# Patient Record
Sex: Male | Born: 1950 | Race: Black or African American | Hispanic: No | Marital: Single | Smoking: Former smoker
Health system: Southern US, Community
[De-identification: ages and names within clinical notes are randomized; demographics above are authoritative.]

## PROBLEM LIST (undated history)

## (undated) ENCOUNTER — Emergency Department (HOSPITAL_COMMUNITY): Payer: Medicare HMO | Source: Home / Self Care

## (undated) DIAGNOSIS — M1990 Unspecified osteoarthritis, unspecified site: Secondary | ICD-10-CM

## (undated) DIAGNOSIS — E785 Hyperlipidemia, unspecified: Secondary | ICD-10-CM

## (undated) DIAGNOSIS — T7840XA Allergy, unspecified, initial encounter: Secondary | ICD-10-CM

## (undated) DIAGNOSIS — I10 Essential (primary) hypertension: Secondary | ICD-10-CM

## (undated) HISTORY — DX: Hyperlipidemia, unspecified: E78.5

## (undated) HISTORY — DX: Unspecified osteoarthritis, unspecified site: M19.90

## (undated) HISTORY — DX: Allergy, unspecified, initial encounter: T78.40XA

---

## 1998-05-10 ENCOUNTER — Emergency Department (HOSPITAL_COMMUNITY): Admission: EM | Admit: 1998-05-10 | Discharge: 1998-05-10 | Payer: Self-pay | Admitting: Emergency Medicine

## 2002-04-05 ENCOUNTER — Encounter: Payer: Self-pay | Admitting: Internal Medicine

## 2002-04-25 ENCOUNTER — Encounter: Payer: Self-pay | Admitting: Internal Medicine

## 2003-06-29 ENCOUNTER — Emergency Department (HOSPITAL_COMMUNITY): Admission: EM | Admit: 2003-06-29 | Discharge: 2003-06-29 | Payer: Self-pay | Admitting: Emergency Medicine

## 2004-08-06 ENCOUNTER — Ambulatory Visit: Payer: Self-pay | Admitting: Internal Medicine

## 2004-11-22 ENCOUNTER — Emergency Department (HOSPITAL_COMMUNITY): Admission: EM | Admit: 2004-11-22 | Discharge: 2004-11-22 | Payer: Self-pay | Admitting: Emergency Medicine

## 2004-12-14 ENCOUNTER — Ambulatory Visit: Payer: Self-pay | Admitting: Internal Medicine

## 2004-12-23 ENCOUNTER — Encounter: Payer: Self-pay | Admitting: Internal Medicine

## 2004-12-23 ENCOUNTER — Ambulatory Visit: Payer: Self-pay

## 2005-02-19 ENCOUNTER — Ambulatory Visit: Payer: Self-pay | Admitting: Internal Medicine

## 2005-03-24 ENCOUNTER — Ambulatory Visit: Payer: Self-pay | Admitting: Internal Medicine

## 2005-07-29 ENCOUNTER — Ambulatory Visit: Payer: Self-pay | Admitting: Internal Medicine

## 2006-03-30 ENCOUNTER — Ambulatory Visit: Payer: Self-pay | Admitting: Internal Medicine

## 2006-07-19 ENCOUNTER — Ambulatory Visit: Payer: Self-pay | Admitting: Internal Medicine

## 2006-12-16 ENCOUNTER — Telehealth: Payer: Self-pay | Admitting: Internal Medicine

## 2006-12-16 ENCOUNTER — Ambulatory Visit: Payer: Self-pay | Admitting: Internal Medicine

## 2006-12-16 DIAGNOSIS — D649 Anemia, unspecified: Secondary | ICD-10-CM | POA: Insufficient documentation

## 2006-12-16 DIAGNOSIS — I10 Essential (primary) hypertension: Secondary | ICD-10-CM

## 2006-12-16 DIAGNOSIS — K644 Residual hemorrhoidal skin tags: Secondary | ICD-10-CM | POA: Insufficient documentation

## 2007-05-02 ENCOUNTER — Ambulatory Visit: Payer: Self-pay | Admitting: Internal Medicine

## 2007-05-02 LAB — CONVERTED CEMR LAB
Bilirubin Urine: NEGATIVE
Blood in Urine, dipstick: NEGATIVE
Glucose, Urine, Semiquant: NEGATIVE
Ketones, urine, test strip: NEGATIVE
Nitrite: NEGATIVE
Protein, U semiquant: NEGATIVE
Specific Gravity, Urine: 1.025
Urobilinogen, UA: 0.2
WBC Urine, dipstick: NEGATIVE
pH: 5

## 2007-05-03 LAB — CONVERTED CEMR LAB
ALT: 23 units/L (ref 0–53)
AST: 21 units/L (ref 0–37)
Albumin: 3.7 g/dL (ref 3.5–5.2)
Alkaline Phosphatase: 58 units/L (ref 39–117)
BUN: 19 mg/dL (ref 6–23)
Basophils Absolute: 0 10*3/uL (ref 0.0–0.1)
Basophils Relative: 0 % (ref 0.0–1.0)
Bilirubin, Direct: 0.1 mg/dL (ref 0.0–0.3)
CO2: 24 meq/L (ref 19–32)
Calcium: 9.2 mg/dL (ref 8.4–10.5)
Chloride: 108 meq/L (ref 96–112)
Cholesterol: 193 mg/dL (ref 0–200)
Creatinine, Ser: 1.5 mg/dL (ref 0.4–1.5)
Eosinophils Absolute: 0.2 10*3/uL (ref 0.0–0.7)
Eosinophils Relative: 3.3 % (ref 0.0–5.0)
GFR calc Af Amer: 62 mL/min
GFR calc non Af Amer: 51 mL/min
Glucose, Bld: 88 mg/dL (ref 70–99)
HCT: 39.1 % (ref 39.0–52.0)
HDL: 46.2 mg/dL (ref >39.0)
Hemoglobin: 12.8 g/dL — ABNORMAL LOW (ref 13.0–17.0)
LDL Cholesterol: 124 mg/dL — ABNORMAL HIGH (ref 0–99)
Lymphocytes Relative: 49.9 % — ABNORMAL HIGH (ref 12.0–46.0)
MCHC: 32.8 g/dL (ref 30.0–36.0)
MCV: 94.5 fL (ref 78.0–100.0)
Monocytes Absolute: 0.6 10*3/uL (ref 0.1–1.0)
Monocytes Relative: 11.9 % (ref 3.0–12.0)
Neutro Abs: 1.7 10*3/uL (ref 1.4–7.7)
Neutrophils Relative %: 34.9 % — ABNORMAL LOW (ref 43.0–77.0)
PSA: 0.57 ng/mL (ref 0.10–4.00)
Platelets: 229 10*3/uL (ref 150–400)
Potassium: 4.1 meq/L (ref 3.5–5.1)
RBC: 4.14 M/uL — ABNORMAL LOW (ref 4.22–5.81)
RDW: 12.3 % (ref 11.5–14.6)
Sodium: 138 meq/L (ref 135–145)
TSH: 1.59 microintl units/mL (ref 0.35–5.50)
Total Bilirubin: 0.7 mg/dL (ref 0.3–1.2)
Total CHOL/HDL Ratio: 4.2
Total Protein: 7.2 g/dL (ref 6.0–8.3)
Triglycerides: 113 mg/dL (ref 0–149)
VLDL: 23 mg/dL (ref 0–40)
WBC: 5 10*3/uL (ref 4.5–10.5)

## 2007-06-06 ENCOUNTER — Ambulatory Visit: Payer: Self-pay | Admitting: Internal Medicine

## 2007-06-06 DIAGNOSIS — F528 Other sexual dysfunction not due to a substance or known physiological condition: Secondary | ICD-10-CM

## 2007-07-12 IMAGING — CR DG CHEST 2V
2 series · 2 of 2 positions shown · non-contrast
Comparison: None.

CLINICAL DATA: Fall.  Pain.  
 2-VIEW CHEST:

[view not recorded (1 of 2)]
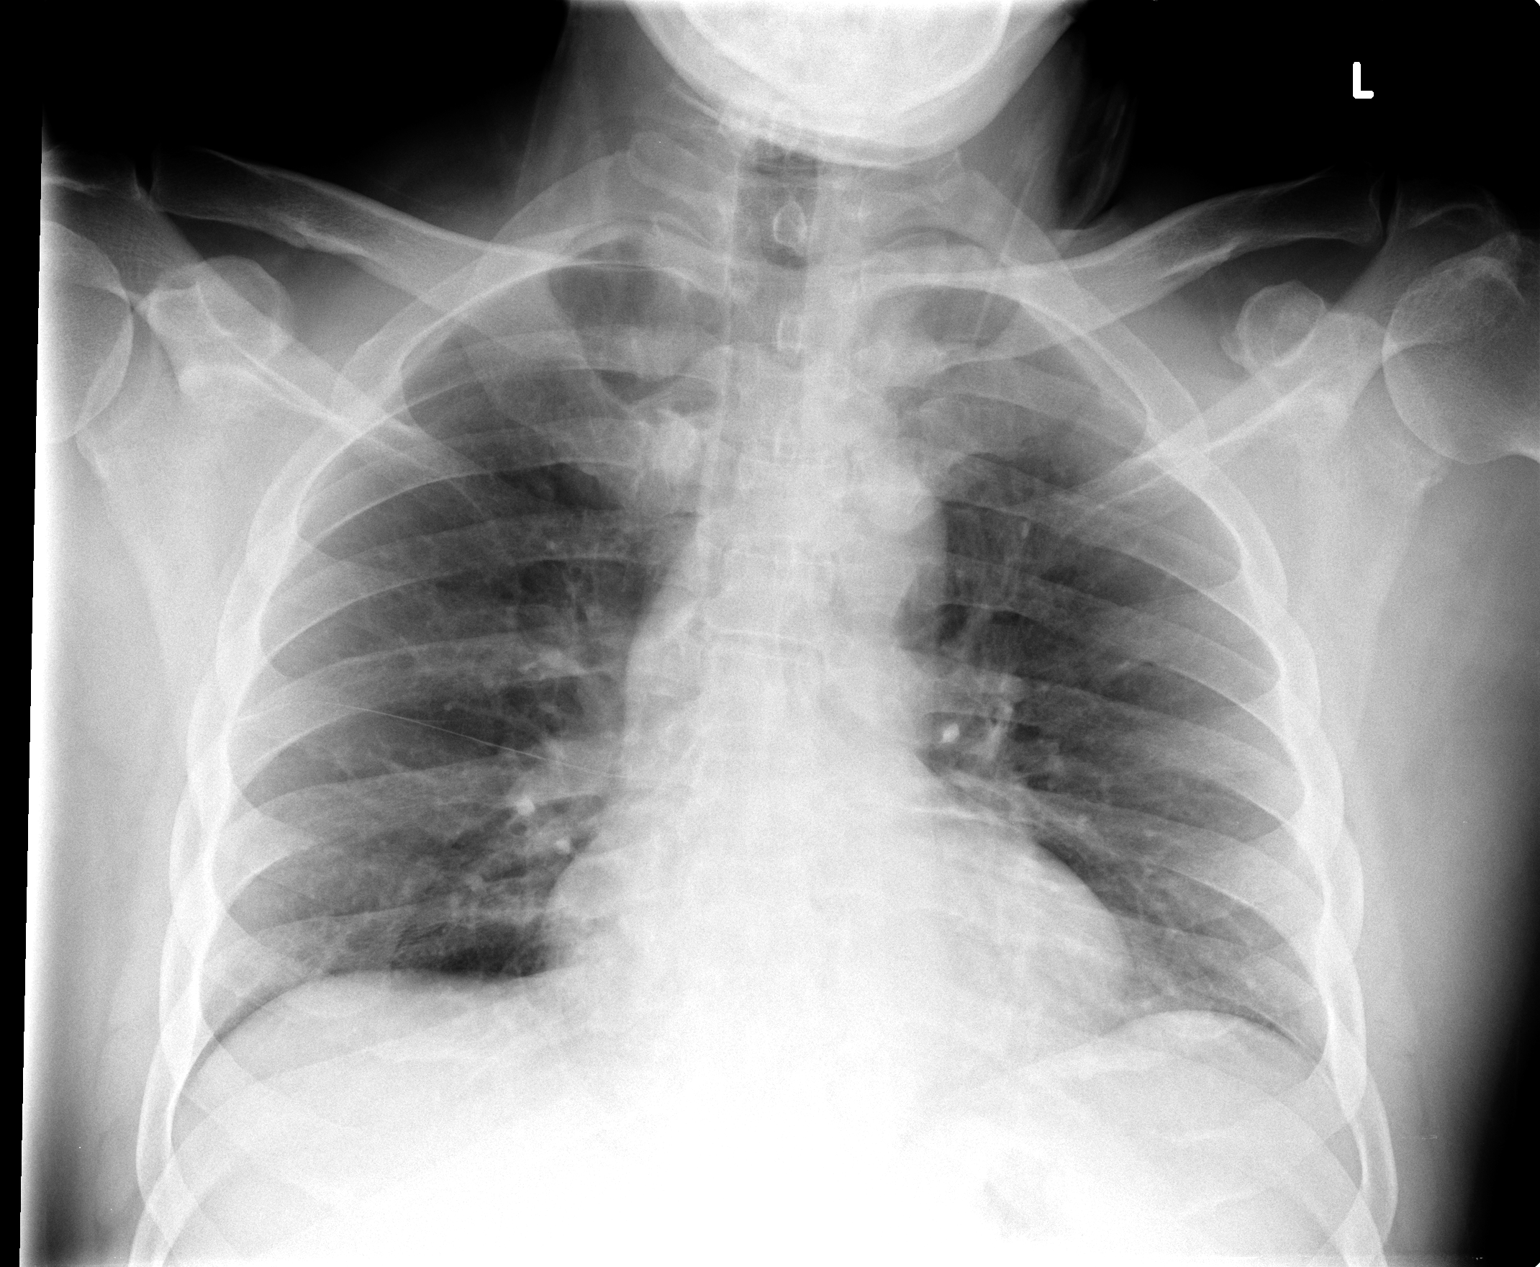

[view not recorded (2 of 2)]
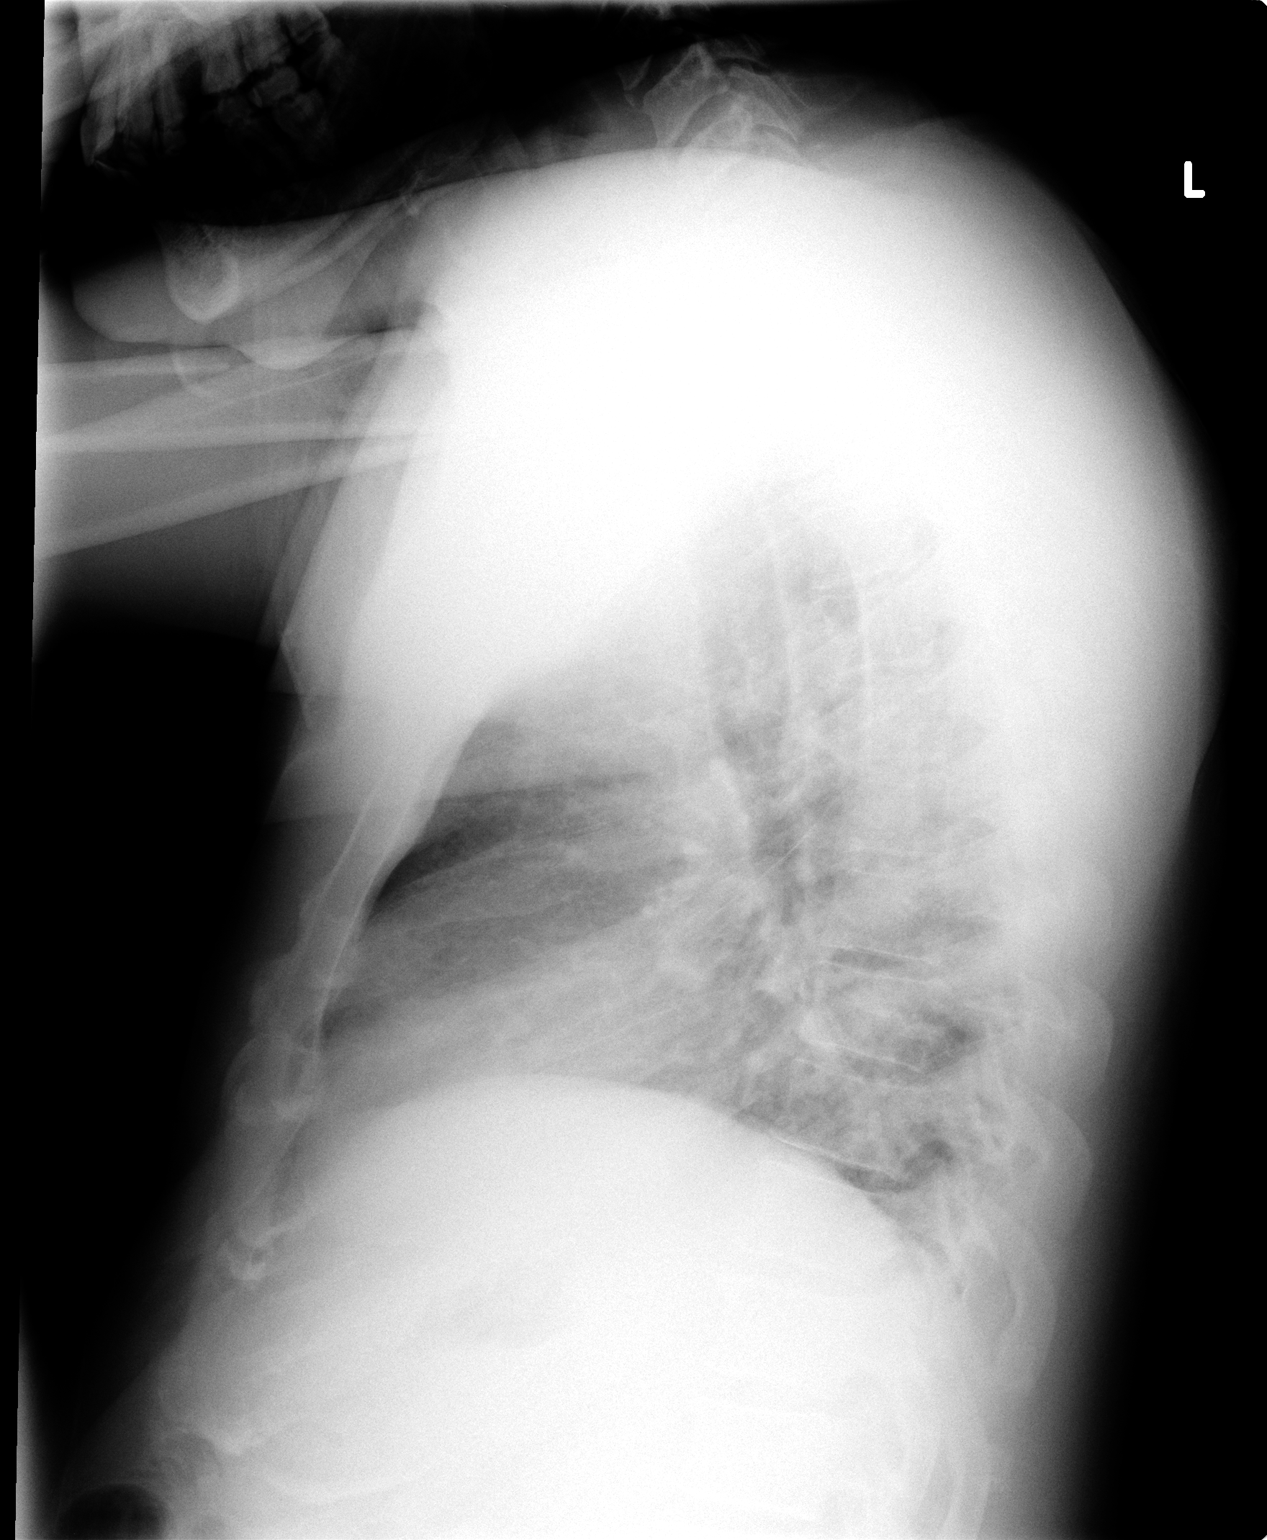

[2 of 2 positions shown; findings below may reference images not displayed]

FINDINGS: Heart size is upper limits of normal.  
 There are no effusions. 
 No pulmonary edema.  No focal lung opacities are identified.
IMPRESSION: Mild cardiomegaly.
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
## 2010-04-10 ENCOUNTER — Emergency Department (HOSPITAL_COMMUNITY)
Admission: EM | Admit: 2010-04-10 | Discharge: 2010-04-10 | Disposition: A | Discharge status: Home / Self Care | Payer: Self-pay | Attending: Emergency Medicine | Admitting: Emergency Medicine

## 2010-04-10 DIAGNOSIS — R109 Unspecified abdominal pain: Secondary | ICD-10-CM | POA: Insufficient documentation

## 2010-04-10 DIAGNOSIS — I10 Essential (primary) hypertension: Secondary | ICD-10-CM | POA: Insufficient documentation

## 2010-04-10 LAB — URINALYSIS, ROUTINE W REFLEX MICROSCOPIC
Bilirubin Urine: NEGATIVE
Glucose, UA: NEGATIVE mg/dL
Hgb urine dipstick: NEGATIVE
Ketones, ur: NEGATIVE mg/dL
Nitrite: NEGATIVE
Protein, ur: NEGATIVE mg/dL
Specific Gravity, Urine: 1.027 (ref 1.005–1.030)
Urobilinogen, UA: 0.2 mg/dL (ref 0.0–1.0)
pH: 5.5 (ref 5.0–8.0)

## 2010-04-10 LAB — CBC
HCT: 38.2 % — ABNORMAL LOW (ref 39.0–52.0)
Hemoglobin: 12.9 g/dL — ABNORMAL LOW (ref 13.0–17.0)
MCH: 30.8 pg (ref 26.0–34.0)
MCHC: 33.8 g/dL (ref 30.0–36.0)
MCV: 91.2 fL (ref 78.0–100.0)
Platelets: 235 10*3/uL (ref 150–400)
RBC: 4.19 MIL/uL — ABNORMAL LOW (ref 4.22–5.81)
RDW: 12.7 % (ref 11.5–15.5)
WBC: 4.8 10*3/uL (ref 4.0–10.5)

## 2010-04-10 LAB — URINE MICROSCOPIC-ADD ON

## 2010-04-10 LAB — DIFFERENTIAL
Basophils Absolute: 0 10*3/uL (ref 0.0–0.1)
Basophils Relative: 0 % (ref 0–1)
Eosinophils Absolute: 0.1 K/uL (ref 0.0–0.7)
Eosinophils Relative: 3 % (ref 0–5)
Lymphocytes Relative: 42 % (ref 12–46)
Lymphs Abs: 2 10*3/uL (ref 0.7–4.0)
Monocytes Absolute: 0.5 10*3/uL (ref 0.1–1.0)
Monocytes Relative: 10 % (ref 3–12)
Neutro Abs: 2.2 10*3/uL (ref 1.7–7.7)
Neutrophils Relative %: 45 % (ref 43–77)

## 2010-04-10 LAB — COMPREHENSIVE METABOLIC PANEL
ALT: 20 U/L (ref 0–53)
AST: 19 U/L (ref 0–37)
Albumin: 3.9 g/dL (ref 3.5–5.2)
Alkaline Phosphatase: 57 U/L (ref 39–117)
BUN: 15 mg/dL (ref 6–23)
CO2: 24 mEq/L (ref 19–32)
Calcium: 9.4 mg/dL (ref 8.4–10.5)
Chloride: 107 mEq/L (ref 96–112)
Creatinine, Ser: 1.23 mg/dL (ref 0.4–1.5)
GFR calc Af Amer: >60 mL/min (ref >60)
GFR calc non Af Amer: >60 mL/min (ref >60)
Glucose, Bld: 100 mg/dL — ABNORMAL HIGH (ref 70–99)
Potassium: 4.1 mEq/L (ref 3.5–5.1)
Sodium: 136 mEq/L (ref 135–145)
Total Bilirubin: 0.4 mg/dL (ref 0.3–1.2)
Total Protein: 7.8 g/dL (ref 6.0–8.3)

## 2010-04-12 LAB — URINE CULTURE
Colony Count: NO GROWTH
Culture  Setup Time: 201203171312
Culture: NO GROWTH

## 2010-04-16 ENCOUNTER — Emergency Department (HOSPITAL_COMMUNITY)
Admission: EM | Admit: 2010-04-16 | Discharge: 2010-04-16 | Disposition: A | Discharge status: Home / Self Care | Payer: Self-pay | Attending: Emergency Medicine | Admitting: Emergency Medicine

## 2010-04-16 DIAGNOSIS — R12 Heartburn: Secondary | ICD-10-CM | POA: Insufficient documentation

## 2010-04-16 DIAGNOSIS — R1013 Epigastric pain: Secondary | ICD-10-CM | POA: Insufficient documentation

## 2010-04-16 DIAGNOSIS — I10 Essential (primary) hypertension: Secondary | ICD-10-CM | POA: Insufficient documentation

## 2010-04-16 LAB — COMPREHENSIVE METABOLIC PANEL
ALT: 17 U/L (ref 0–53)
AST: 22 U/L (ref 0–37)
Albumin: 3.9 g/dL (ref 3.5–5.2)
Alkaline Phosphatase: 66 U/L (ref 39–117)
BUN: 11 mg/dL (ref 6–23)
CO2: 27 mEq/L (ref 19–32)
Calcium: 9.3 mg/dL (ref 8.4–10.5)
Chloride: 107 mEq/L (ref 96–112)
Creatinine, Ser: 1.34 mg/dL (ref 0.4–1.5)
GFR calc Af Amer: >60 mL/min (ref >60)
GFR calc non Af Amer: 54 mL/min — ABNORMAL LOW (ref >60)
Glucose, Bld: 99 mg/dL (ref 70–99)
Potassium: 4.6 mEq/L (ref 3.5–5.1)
Sodium: 139 mEq/L (ref 135–145)
Total Bilirubin: 0.8 mg/dL (ref 0.3–1.2)
Total Protein: 7.4 g/dL (ref 6.0–8.3)

## 2010-04-16 LAB — CBC
HCT: 37.5 % — ABNORMAL LOW (ref 39.0–52.0)
Hemoglobin: 12.9 g/dL — ABNORMAL LOW (ref 13.0–17.0)
MCH: 31.9 pg (ref 26.0–34.0)
MCHC: 34.4 g/dL (ref 30.0–36.0)
MCV: 92.6 fL (ref 78.0–100.0)
Platelets: 261 10*3/uL (ref 150–400)
RBC: 4.05 MIL/uL — ABNORMAL LOW (ref 4.22–5.81)
RDW: 12.6 % (ref 11.5–15.5)
WBC: 5 10*3/uL (ref 4.0–10.5)

## 2010-04-16 LAB — DIFFERENTIAL
Basophils Absolute: 0 10*3/uL (ref 0.0–0.1)
Basophils Relative: 1 % (ref 0–1)
Eosinophils Absolute: 0.4 10*3/uL (ref 0.0–0.7)
Eosinophils Relative: 7 % — ABNORMAL HIGH (ref 0–5)
Lymphocytes Relative: 34 % (ref 12–46)
Lymphs Abs: 1.7 10*3/uL (ref 0.7–4.0)
Monocytes Absolute: 0.5 10*3/uL (ref 0.1–1.0)
Monocytes Relative: 11 % (ref 3–12)
Neutro Abs: 2.4 10*3/uL (ref 1.7–7.7)
Neutrophils Relative %: 47 % (ref 43–77)

## 2010-04-16 LAB — LIPASE, BLOOD: Lipase: 28 U/L (ref 11–59)

## 2010-04-16 LAB — POCT CARDIAC MARKERS
CKMB, poc: <1 ng/mL — ABNORMAL LOW (ref 1.0–8.0)
Myoglobin, poc: 75.8 ng/mL (ref 12–200)
Troponin i, poc: <0.05 ng/mL (ref 0.00–0.09)

## 2010-11-28 ENCOUNTER — Emergency Department (HOSPITAL_COMMUNITY): Payer: Self-pay

## 2010-11-28 ENCOUNTER — Emergency Department (HOSPITAL_COMMUNITY)
Admission: EM | Admit: 2010-11-28 | Discharge: 2010-11-28 | Disposition: A | Discharge status: Home / Self Care | Payer: Self-pay | Attending: Emergency Medicine | Admitting: Emergency Medicine

## 2010-11-28 DIAGNOSIS — D649 Anemia, unspecified: Secondary | ICD-10-CM | POA: Insufficient documentation

## 2010-11-28 DIAGNOSIS — I10 Essential (primary) hypertension: Secondary | ICD-10-CM | POA: Insufficient documentation

## 2010-11-28 DIAGNOSIS — K922 Gastrointestinal hemorrhage, unspecified: Secondary | ICD-10-CM | POA: Insufficient documentation

## 2010-11-28 DIAGNOSIS — R110 Nausea: Secondary | ICD-10-CM | POA: Insufficient documentation

## 2010-11-28 DIAGNOSIS — K5733 Diverticulitis of large intestine without perforation or abscess with bleeding: Secondary | ICD-10-CM | POA: Insufficient documentation

## 2010-11-28 DIAGNOSIS — R1013 Epigastric pain: Secondary | ICD-10-CM | POA: Insufficient documentation

## 2010-11-28 LAB — CBC
HCT: 29.7 % — ABNORMAL LOW (ref 39.0–52.0)
Hemoglobin: 10.2 g/dL — ABNORMAL LOW (ref 13.0–17.0)
MCH: 31.4 pg (ref 26.0–34.0)
MCHC: 34.3 g/dL (ref 30.0–36.0)
MCV: 91.4 fL (ref 78.0–100.0)
Platelets: 207 10*3/uL (ref 150–400)
RBC: 3.25 MIL/uL — ABNORMAL LOW (ref 4.22–5.81)
RDW: 12.1 % (ref 11.5–15.5)
WBC: 5.5 10*3/uL (ref 4.0–10.5)

## 2010-11-28 LAB — DIFFERENTIAL
Basophils Absolute: 0 10*3/uL (ref 0.0–0.1)
Basophils Relative: 0 % (ref 0–1)
Eosinophils Absolute: 0.2 10*3/uL (ref 0.0–0.7)
Eosinophils Relative: 3 % (ref 0–5)
Lymphocytes Relative: 43 % (ref 12–46)
Lymphs Abs: 2.3 10*3/uL (ref 0.7–4.0)
Monocytes Absolute: 0.5 10*3/uL (ref 0.1–1.0)
Monocytes Relative: 9 % (ref 3–12)
Neutro Abs: 2.5 10*3/uL (ref 1.7–7.7)
Neutrophils Relative %: 45 % (ref 43–77)

## 2010-11-28 LAB — URINALYSIS, ROUTINE W REFLEX MICROSCOPIC
Bilirubin Urine: NEGATIVE
Ketones, ur: NEGATIVE mg/dL
Leukocytes, UA: NEGATIVE
Nitrite: NEGATIVE
Urobilinogen, UA: 0.2 mg/dL (ref 0.0–1.0)

## 2010-11-28 LAB — OCCULT BLOOD, POC DEVICE: Fecal Occult Bld: POSITIVE

## 2010-11-28 LAB — COMPREHENSIVE METABOLIC PANEL
ALT: 17 U/L (ref 0–53)
AST: 16 U/L (ref 0–37)
Albumin: 3.3 g/dL — ABNORMAL LOW (ref 3.5–5.2)
Alkaline Phosphatase: 59 U/L (ref 39–117)
BUN: 17 mg/dL (ref 6–23)
CO2: 21 mEq/L (ref 19–32)
Calcium: 9.1 mg/dL (ref 8.4–10.5)
Chloride: 106 mEq/L (ref 96–112)
Creatinine, Ser: 1.24 mg/dL (ref 0.50–1.35)
GFR calc Af Amer: 71 mL/min — ABNORMAL LOW (ref >90)
GFR calc non Af Amer: 61 mL/min — ABNORMAL LOW (ref >90)
Glucose, Bld: 108 mg/dL — ABNORMAL HIGH (ref 70–99)
Potassium: 4 mEq/L (ref 3.5–5.1)
Sodium: 138 mEq/L (ref 135–145)
Total Bilirubin: 0.3 mg/dL (ref 0.3–1.2)
Total Protein: 6.5 g/dL (ref 6.0–8.3)

## 2010-11-28 LAB — LIPASE, BLOOD: Lipase: 27 U/L (ref 11–59)

## 2010-11-28 MED ORDER — IOHEXOL 300 MG/ML  SOLN
100.0000 mL | Freq: Once | INTRAMUSCULAR | Status: AC | PRN
  Administered 2010-11-28: 100 mL via INTRAVENOUS

## 2012-05-03 ENCOUNTER — Ambulatory Visit: Payer: Self-pay | Admitting: Family Medicine

## 2012-06-02 ENCOUNTER — Encounter (INDEPENDENT_AMBULATORY_CARE_PROVIDER_SITE_OTHER): Payer: BC Managed Care – PPO | Admitting: Ophthalmology

## 2012-06-02 DIAGNOSIS — I10 Essential (primary) hypertension: Secondary | ICD-10-CM

## 2012-06-02 DIAGNOSIS — H35039 Hypertensive retinopathy, unspecified eye: Secondary | ICD-10-CM

## 2012-06-02 DIAGNOSIS — H348392 Tributary (branch) retinal vein occlusion, unspecified eye, stable: Secondary | ICD-10-CM

## 2012-06-02 DIAGNOSIS — H43819 Vitreous degeneration, unspecified eye: Secondary | ICD-10-CM

## 2012-06-02 DIAGNOSIS — H2510 Age-related nuclear cataract, unspecified eye: Secondary | ICD-10-CM

## 2012-06-15 ENCOUNTER — Emergency Department (HOSPITAL_COMMUNITY)
Admission: EM | Admit: 2012-06-15 | Discharge: 2012-06-15 | Disposition: A | Discharge status: Home / Self Care | Payer: BC Managed Care – PPO | Attending: Emergency Medicine | Admitting: Emergency Medicine

## 2012-06-15 ENCOUNTER — Encounter (HOSPITAL_COMMUNITY): Payer: Self-pay | Admitting: Emergency Medicine

## 2012-06-15 ENCOUNTER — Emergency Department (HOSPITAL_COMMUNITY): Payer: BC Managed Care – PPO

## 2012-06-15 DIAGNOSIS — I10 Essential (primary) hypertension: Secondary | ICD-10-CM | POA: Insufficient documentation

## 2012-06-15 DIAGNOSIS — Z79899 Other long term (current) drug therapy: Secondary | ICD-10-CM | POA: Insufficient documentation

## 2012-06-15 DIAGNOSIS — K859 Acute pancreatitis without necrosis or infection, unspecified: Secondary | ICD-10-CM | POA: Insufficient documentation

## 2012-06-15 DIAGNOSIS — R630 Anorexia: Secondary | ICD-10-CM | POA: Insufficient documentation

## 2012-06-15 HISTORY — DX: Essential (primary) hypertension: I10

## 2012-06-15 LAB — COMPREHENSIVE METABOLIC PANEL
ALT: 39 U/L (ref 0–53)
AST: 30 U/L (ref 0–37)
Albumin: 3.5 g/dL (ref 3.5–5.2)
Alkaline Phosphatase: 85 U/L (ref 39–117)
BUN: 14 mg/dL (ref 6–23)
Potassium: 3.7 mEq/L (ref 3.5–5.1)
Sodium: 137 mEq/L (ref 135–145)
Total Protein: 8.2 g/dL (ref 6.0–8.3)

## 2012-06-15 LAB — CBC WITH DIFFERENTIAL/PLATELET
Basophils Absolute: 0 10*3/uL (ref 0.0–0.1)
Basophils Relative: 0 % (ref 0–1)
Eosinophils Absolute: 0.1 10*3/uL (ref 0.0–0.7)
MCH: 31 pg (ref 26.0–34.0)
MCHC: 34.3 g/dL (ref 30.0–36.0)
Neutro Abs: 6.6 10*3/uL (ref 1.7–7.7)
Neutrophils Relative %: 72 % (ref 43–77)
Platelets: 491 10*3/uL — ABNORMAL HIGH (ref 150–400)
RDW: 12.5 % (ref 11.5–15.5)

## 2012-06-15 LAB — LIPASE, BLOOD: Lipase: 290 U/L — ABNORMAL HIGH (ref 11–59)

## 2012-06-15 MED ORDER — HYDROMORPHONE HCL PF 1 MG/ML IJ SOLN
1.0000 mg | Freq: Once | INTRAMUSCULAR | Status: AC
  Administered 2012-06-15: 1 mg via INTRAVENOUS
  Filled 2012-06-15: qty 1

## 2012-06-15 MED ORDER — IOHEXOL 300 MG/ML  SOLN
25.0000 mL | INTRAMUSCULAR | Status: AC
  Administered 2012-06-15: 25 mL via ORAL

## 2012-06-15 MED ORDER — SODIUM CHLORIDE 0.9 % IV BOLUS (SEPSIS)
1000.0000 mL | Freq: Once | INTRAVENOUS | Status: AC
  Administered 2012-06-15: 1000 mL via INTRAVENOUS

## 2012-06-15 MED ORDER — OXYCODONE-ACETAMINOPHEN 5-325 MG PO TABS: 1.0000 | ORAL_TABLET | Freq: Four times a day (QID) | ORAL | Status: DC | PRN

## 2012-06-15 MED ORDER — ONDANSETRON HCL 4 MG/2ML IJ SOLN
4.0000 mg | Freq: Once | INTRAMUSCULAR | Status: AC
  Administered 2012-06-15: 4 mg via INTRAVENOUS
  Filled 2012-06-15: qty 2

## 2012-06-15 MED ORDER — IOHEXOL 300 MG/ML  SOLN
100.0000 mL | Freq: Once | INTRAMUSCULAR | Status: AC | PRN
  Administered 2012-06-15: 100 mL via INTRAVENOUS

## 2012-06-15 NOTE — ED Notes (Signed)
Pt d/c in NAD, pt voiced understanding of d/c instructions and follow up care. Pt instructed not to drive until 1610. Pt voiced understanding. Waiting in waiting room under eye of Nurse First until pt allowed to drive.

## 2012-06-15 NOTE — ED Notes (Signed)
Results of lactic acid shown to nurse on pod B

## 2012-06-15 NOTE — ED Notes (Signed)
Pt transported to CT ?

## 2012-06-15 NOTE — ED Provider Notes (Signed)
History     CSN: [id number]  Arrival date & time 06/15/12  1478   First MD Initiated Contact with Patient 06/15/12 [phone]      No chief complaint on file.   (Consider location/radiation/quality/duration/timing/severity/associated sxs/prior treatment) HPI Comments: Patient states around 9 PM last night he developed severe abdominal pain not sharp in nature without radiation. He describes the pain in the middle of his abdomen. No nausea, vomiting, diarrhea, constipation, dysuria, chest pain or shortness of breath.  No prior surgical history on the abdomen and denies any change in color of his bowel movements. Last normal bowel movement was yesterday.  Patient is a 62 y.o. male presenting with abdominal pain. The history is provided by the patient.  Abdominal Pain This is a recurrent (Patient had a similar episode 3 weeks ago that lasted for a few hours and resolved. Pain returned last night and has been persistent) problem. The current episode started 6 to 12 hours ago. The problem occurs constantly. The problem has not changed since onset.Associated symptoms include abdominal pain. Pertinent negatives include no chest pain, no headaches and no shortness of breath. The symptoms are aggravated by walking. Nothing relieves the symptoms. He has tried acetaminophen for the symptoms. The treatment provided no relief.    No past medical history on file.  No past surgical history on file.  No family history on file.  History  Substance Use Topics  . Smoking status: Not on file  . Smokeless tobacco: Not on file  . Alcohol Use: Not on file      Review of Systems  Constitutional: Positive for appetite change. Negative for fever.       States since the chest pain started he has not been hungry  Respiratory: Negative for cough and shortness of breath.   Cardiovascular: Negative for chest pain.  Gastrointestinal: Positive for abdominal pain. Negative for nausea, vomiting, diarrhea,  constipation and blood in stool.  Genitourinary: Negative for dysuria, frequency and flank pain.  Musculoskeletal: Negative for back pain.  Neurological: Negative for headaches.  All other systems reviewed and are negative.    Allergies  Review of patient's allergies indicates not on file.  Home Medications  No current outpatient prescriptions on file.  There were no vitals taken for this visit.  Physical Exam  Nursing note and vitals reviewed. Constitutional: He is oriented to person, place, and time. He appears well-developed and well-nourished. He appears distressed.  Appears uncomfortable  HENT:  Head: Normocephalic and atraumatic.  Mouth/Throat: Oropharynx is clear and moist.  Eyes: Conjunctivae and EOM are normal. Pupils are equal, round, and reactive to light.  Neck: Normal range of motion. Neck supple.  Cardiovascular: Normal rate, regular rhythm and intact distal pulses.   No murmur heard. Pulmonary/Chest: Effort normal and breath sounds normal. No respiratory distress. He has no wheezes. He has no rales.  Abdominal: Soft. He exhibits distension. There is no tenderness. There is no rebound and no guarding.  Unable to reproduce any significant abdominal tenderness  Musculoskeletal: Normal range of motion. He exhibits no edema and no tenderness.  Neurological: He is alert and oriented to person, place, and time.  Skin: Skin is warm and dry. No rash noted. No erythema.  Psychiatric: He has a normal mood and affect. His behavior is normal.    ED Course  Procedures (including critical care time)  Labs Reviewed  CBC WITH DIFFERENTIAL - Abnormal; Notable for the following:    RBC 3.94 (*)    Hemoglobin  12.2 (*)    HCT 35.6 (*)    Platelets 491 (*)    All other components within normal limits  COMPREHENSIVE METABOLIC PANEL - Abnormal; Notable for the following:    GFR calc non Af Amer 71 (*)    GFR calc Af Amer 82 (*)    All other components within normal limits   LIPASE, BLOOD - Abnormal; Notable for the following:    Lipase 290 (*)    All other components within normal limits  URINALYSIS, ROUTINE W REFLEX MICROSCOPIC  CG4 I-STAT (LACTIC ACID)   Ct Abdomen Pelvis W Contrast  06/15/2012   *RADIOLOGY REPORT*  Clinical Data: Abdominal pain for 2-3 weeks, resolved than returned 1 day ago, history hypertension  CT ABDOMEN AND PELVIS WITH CONTRAST  Technique:  Multidetector CT imaging of the abdomen and pelvis was performed following the standard protocol during bolus administration of intravenous contrast. Sagittal and coronal MPR images reconstructed from axial data set.  Contrast: OMNIPAQUE IOHEXOL 300 MG/ML  SOLN Dilute oral contrast.  Comparison: 11/28/2010  Findings: Minimal dependent atelectasis at lung bases. Tail of pancreas appears mildly enlarged with mild stranding of peripancreatic fat, cannot exclude distal pancreatitis. Tiny left renal cyst. Mild perinephric stranding without hydronephrosis or ureteral dilatation. Tiny umbilical hernia containing fat. Liver, spleen, pancreas, kidneys, and adrenal glands otherwise normal appearance.  Upper normal to minimally enlarged peripancreatic and celiac axis lymph nodes largest 19 x 13 mm image 29. Normal appendix. Low attenuation dependent free pelvic fluid. Scattered colonic diverticulosis without evidence of diverticulitis. Stomach and bowel loops otherwise normal appearance. No mass, additional hernia, or free intraperitoneal air. No acute osseous findings.  IMPRESSION: Question subtle infiltrative changes adjacent to the pancreatic tail, cannot exclude distal pancreatitis; recommend correlation with serum amylase. Small amount of nonspecific dependent low attenuation free pelvic fluid. Nonspecific mild peripancreatic/celiac adenopathy. Diffuse colonic diverticulosis without evidence of diverticulitis. Small umbilical hernia containing fat.   Original Report Authenticated By: Ulyses Southward, M.D.     No  diagnosis found.    MDM   Patient presenting here complaining of abdominal pain that started at 9 PM last night and has persisted. Similar episode 3 weeks ago that resolved spontaneously. On exam patient appears uncomfortable however abdominal pain out of proportion to exam findings. No palpable tenderness on exam abdomen is mildly distended. He denies change in bowel movements or urinary symptoms. No chest pain, shortness of breath or radiation of pain into the back.  Concern for possible mesenteric ischemia versus bowel perforation versus peptic ulcer disease versus other abdominal pathology.  CBC, CMP, lipase, UA, lactate, CT abdomen and pelvis pending. Patient given IV fluids, pain and nausea control  11:06 AM Labs are within normal limits except for an elevated lipase of 290. This would definitely explain his pain. However will do a CT to rule out underlying causes for patient's new pancreatitis. He denies heavy alcohol use and does not drink daily. He has no right upper quadrant tenderness to make me concerned for his gallbladder  1:19 PM CT showing uncomplicated pancreatitis without masses, gallbladder pathology or other associated etiologies. Patient's pain control and tolerating by mouth so we'll discharge home.    Gwyneth Sprout, MD 06/15/12 1319

## 2012-06-15 NOTE — ED Notes (Signed)
Onset abdominal pain 2-3 weeks ago resolved and returned one day ago denies nausea emesis or diarrhea.

## 2012-07-05 ENCOUNTER — Encounter (INDEPENDENT_AMBULATORY_CARE_PROVIDER_SITE_OTHER): Payer: BC Managed Care – PPO | Admitting: Ophthalmology

## 2012-07-19 ENCOUNTER — Encounter: Payer: Self-pay | Admitting: Gastroenterology

## 2012-09-06 ENCOUNTER — Ambulatory Visit (AMBULATORY_SURGERY_CENTER): Payer: BC Managed Care – PPO | Admitting: *Deleted

## 2012-09-06 ENCOUNTER — Encounter: Payer: Self-pay | Admitting: Gastroenterology

## 2012-09-06 VITALS — Ht 73.0 in | Wt 228.0 lb

## 2012-09-06 DIAGNOSIS — Z1211 Encounter for screening for malignant neoplasm of colon: Secondary | ICD-10-CM

## 2012-09-06 MED ORDER — MOVIPREP 100 G PO SOLR: ORAL | Status: DC

## 2012-09-06 NOTE — Progress Notes (Signed)
Patient denies any allergies to eggs or soy. 

## 2012-09-20 ENCOUNTER — Encounter: Payer: Self-pay | Admitting: Gastroenterology

## 2012-09-20 ENCOUNTER — Ambulatory Visit (AMBULATORY_SURGERY_CENTER): Payer: BC Managed Care – PPO | Admitting: Gastroenterology

## 2012-09-20 VITALS — BP 150/88 | HR 47 | Temp 97.2°F | Resp 16 | Ht 73.0 in | Wt 228.0 lb

## 2012-09-20 DIAGNOSIS — D126 Benign neoplasm of colon, unspecified: Secondary | ICD-10-CM

## 2012-09-20 DIAGNOSIS — Z1211 Encounter for screening for malignant neoplasm of colon: Secondary | ICD-10-CM

## 2012-09-20 MED ORDER — SODIUM CHLORIDE 0.9 % IV SOLN: 500.0000 mL | INTRAVENOUS | Status: DC
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
## 2012-09-20 NOTE — Progress Notes (Signed)
Lidocaine-40mg IV prior to Propofol InductionPropofol given over incremental dosages 

## 2012-09-20 NOTE — Progress Notes (Signed)
Called to room to assist during endoscopic procedure.  Patient ID and intended procedure confirmed with present staff. Received instructions for my participation in the procedure from the performing physician.  

## 2012-09-20 NOTE — Op Note (Signed)
[facility] [address] [place], [address]   COLONOSCOPY PROCEDURE REPORT  PATIENT: Leslie Livingston, Leslie Livingston  MR#: [id number] BIRTHDATE: December 04, 1950 , 61  yrs. old GENDER: Male ENDOSCOPIST: Meryl Dare, MD, [facility] PROCEDURE DATE:  09/20/2012 PROCEDURE:   Colonoscopy with biopsy and snare polypectomy First Screening Colonoscopy - Avg.  risk and is 50 yrs.  old or older - No.  Prior Negative Screening - Now for repeat screening. 10 or more years since last screening  History of Adenoma - Now for follow-up colonoscopy & has been > or = to 3 yrs.  N/A  Polyps Removed Today? Yes. ASA CLASS:   Class II INDICATIONS:average risk screening. MEDICATIONS: MAC sedation, administered by CRNA and propofol (Diprivan) 350mg  IV DESCRIPTION OF PROCEDURE:   After the risks benefits and alternatives of the procedure were thoroughly explained, informed consent was obtained.  A digital rectal exam revealed no abnormalities of the rectum.   The LB [id number]  endoscope was introduced through the anus and advanced to the cecum, which was identified by both the appendix and ileocecal valve. No adverse events experienced.   The quality of the prep was good, using MoviPrep  The instrument was then slowly withdrawn as the colon was fully examined.  COLON FINDINGS: A sessile polyp measuring 5 mm in size was found in the ascending colon.  A polypectomy was performed with a cold snare.  The resection was complete and the polyp tissue was completely retrieved.   A sessile polyp measuring 3 mm in size was found at the hepatic flexure.  A polypectomy was performed with cold forceps.  The resection was complete and the polyp tissue was completely retrieved.   A sessile polyp measuring 6 mm in size was found in the sigmoid colon.  A polypectomy was performed with a cold snare.  The resection was complete and the polyp tissue was completely retrieved.   Two sessile polyps measuring 3 mm in  size were found in rectum seen upon the retroflexed view.  A polypectomy was performed with cold forceps.  The resection was complete and the polyp tissue was completely retrieved.   Mild diverticulosis was noted in the ascending colon, at the hepatic flexure, and in the transverse colon.  Moderate diverticulosis was noted in the descending colon and sigmoid colon.  The colon was otherwise normal.  There was no diverticulosis, inflammation, polyps or cancers unless previously stated.  Retroflexed views revealed small internal hemorrhoids. The time to cecum=1 minutes 58 seconds. Withdrawal time=14 minutes 48 seconds.  The scope was withdrawn and the procedure completed.  COMPLICATIONS: There were no complications.  ENDOSCOPIC IMPRESSION: 1.   Sessile polyp measuring 5 mm in the ascending colon; polypectomy performed with a cold snare 2.   Sessile polyp measuring 3 mm at the hepatic flexure; polypectomy performed with cold forceps 3.   Sessile polyp measuring 6 mm in the sigmoid colon; polypectomy performed with a cold snare 4.  Two sessile polyps measuring 3 mm in rectum; polypectomy performed with cold forceps 5.   Mild diverticulosis was noted in the ascending colon, at the hepatic flexure, and in the transverse colon 6.   Moderate diverticulosis was noted in the descending colon and sigmoid colon 7.   Small internal hemorrhoids   RECOMMENDATIONS: 1.  Await pathology results 2.  Repeat colonoscopy in 5 years if polyp(s) adenomatous; otherwise 10 years 3.  High fiber diet with liberal fluid intake.   eSigned:  Meryl Dare,  MD, [facility] 09/20/2012 11:58 AM   cc: Fleet Contras, MD   PATIENT NAME:  Leslie Livingston, Leslie Livingston MR#: [id number]

## 2012-09-20 NOTE — Patient Instructions (Addendum)
Discharge instructions given with verbal understanding. Handouts on polyps,diverticulosis and hemorrhoids. Resume previous medications. YOU HAD AN ENDOSCOPIC PROCEDURE TODAY AT THE [facility]: Refer to the procedure report that was given to you for any specific questions about what was found during the examination.  If the procedure report does not answer your questions, please call your gastroenterologist to clarify.  If you requested that your care partner not be given the details of your procedure findings, then the procedure report has been included in a sealed envelope for you to review at your convenience later.  YOU SHOULD EXPECT: Some feelings of bloating in the abdomen. Passage of more gas than usual.  Walking can help get rid of the air that was put into your GI tract during the procedure and reduce the bloating. If you had a lower endoscopy (such as a colonoscopy or flexible sigmoidoscopy) you may notice spotting of blood in your stool or on the toilet paper. If you underwent a bowel prep for your procedure, then you may not have a normal bowel movement for a few days.  DIET: Your first meal following the procedure should be a light meal and then it is ok to progress to your normal diet.  A half-sandwich or bowl of soup is an example of a good first meal.  Heavy or fried foods are harder to digest and may make you feel nauseous or bloated.  Likewise meals heavy in dairy and vegetables can cause extra gas to form and this can also increase the bloating.  Drink plenty of fluids but you should avoid alcoholic beverages for 24 hours.  ACTIVITY: Your care partner should take you home directly after the procedure.  You should plan to take it easy, moving slowly for the rest of the day.  You can resume normal activity the day after the procedure however you should NOT DRIVE or use heavy machinery for 24 hours (because of the sedation medicines used during the test).    SYMPTOMS TO REPORT  IMMEDIATELY: A gastroenterologist can be reached at any hour.  During normal business hours, 8:30 AM to 5:00 PM Monday through Friday, call [phone].  After hours and on weekends, please call the GI answering service at [phone] who will take a message and have the physician on call contact you.   Following lower endoscopy (colonoscopy or flexible sigmoidoscopy):  Excessive amounts of blood in the stool  Significant tenderness or worsening of abdominal pains  Swelling of the abdomen that is new, acute  Fever of 100F or higher  FOLLOW UP: If any biopsies were taken you will be contacted by phone or by letter within the next 1-3 weeks.  Call your gastroenterologist if you have not heard about the biopsies in 3 weeks.  Our staff will call the home number listed on your records the next business day following your procedure to check on you and address any questions or concerns that you may have at that time regarding the information given to you following your procedure. This is a courtesy call and so if there is no answer at the home number and we have not heard from you through the emergency physician on call, we will assume that you have returned to your regular daily activities without incident.  SIGNATURES/CONFIDENTIALITY: You and/or your care partner have signed paperwork which will be entered into your electronic medical record.  These signatures attest to the fact that that the information above on your After Visit Summary   has been reviewed and is understood.  Full responsibility of the confidentiality of this discharge information lies with you and/or your care-partner. 

## 2012-09-21 ENCOUNTER — Telehealth: Payer: Self-pay | Admitting: *Deleted

## 2012-09-21 NOTE — Telephone Encounter (Signed)
  Follow up Call-  Call back number 09/20/2012  Post procedure Call Back phone  # [phone]  Permission to leave phone message Yes     Patient questions:  Do you have a fever, pain , or abdominal swelling? no Pain Score  0 *  Have you tolerated food without any problems? yes  Have you been able to return to your normal activities? yes  Do you have any questions about your discharge instructions: Diet   no Medications  no Follow up visit  no  Do you have questions or concerns about your Care? no  Actions: * If pain score is 4 or above: No action needed, pain <4.

## 2012-09-26 ENCOUNTER — Encounter: Payer: Self-pay | Admitting: Gastroenterology

## 2014-06-05 ENCOUNTER — Encounter: Payer: Self-pay | Admitting: Internal Medicine

## 2015-04-01 ENCOUNTER — Emergency Department (INDEPENDENT_AMBULATORY_CARE_PROVIDER_SITE_OTHER): Payer: Medicare HMO

## 2015-04-01 ENCOUNTER — Encounter (HOSPITAL_COMMUNITY): Payer: Self-pay | Admitting: *Deleted

## 2015-04-01 ENCOUNTER — Emergency Department (INDEPENDENT_AMBULATORY_CARE_PROVIDER_SITE_OTHER)
Admission: EM | Admit: 2015-04-01 | Discharge: 2015-04-01 | Disposition: A | Discharge status: Home / Self Care | Payer: Medicare HMO | Source: Home / Self Care | Attending: Family Medicine | Admitting: Family Medicine

## 2015-04-01 DIAGNOSIS — R69 Illness, unspecified: Principal | ICD-10-CM

## 2015-04-01 DIAGNOSIS — J111 Influenza due to unidentified influenza virus with other respiratory manifestations: Secondary | ICD-10-CM

## 2015-04-01 MED ORDER — GUAIFENESIN-CODEINE 100-10 MG/5ML PO SYRP: 10.0000 mL | ORAL_SOLUTION | Freq: Four times a day (QID) | ORAL | Status: DC | PRN

## 2015-04-01 MED ORDER — GI COCKTAIL ~~LOC~~
30.0000 mL | Freq: Once | ORAL | Status: AC
  Administered 2015-04-01: 30 mL via ORAL

## 2015-04-01 MED ORDER — OSELTAMIVIR PHOSPHATE 75 MG PO CAPS: 75.0000 mg | ORAL_CAPSULE | Freq: Two times a day (BID) | ORAL | Status: DC

## 2015-04-01 NOTE — ED Provider Notes (Signed)
CSN: [id number]     Arrival date & time 04/01/15  1703 History   First MD Initiated Contact with Patient 04/01/15 1736     Chief Complaint  Patient presents with  . Cough   (Consider location/radiation/quality/duration/timing/severity/associated sxs/prior Treatment) Patient is a 65 y.o. male presenting with cough. The history is provided by the patient.  Cough Cough characteristics:  Non-productive, dry and harsh Severity:  Moderate Onset quality:  Sudden Duration:  2 days Progression:  Worsening Chronicity:  New (sx of hiccups, myalgias, fever, cough.)   Past Medical History  Diagnosis Date  . Hypertension    History reviewed. No pertinent past surgical history. Family History  Problem Relation Age of Onset  . Colon cancer Neg Hx   . Stomach cancer Neg Hx    Social History  Substance Use Topics  . Smoking status: Never Smoker   . Smokeless tobacco: Never Used  . Alcohol Use: 3.6 oz/week    6 Cans of beer per week    Review of Systems  Respiratory: Positive for cough.   All other systems reviewed and are negative.   Allergies  Review of patient's allergies indicates no known allergies.  Home Medications   Prior to Admission medications   Medication Sig Start Date End Date Taking? Authorizing Provider  guaiFENesin-codeine (ROBITUSSIN AC) 100-10 MG/5ML syrup Take 10 mLs by mouth 4 (four) times daily as needed for cough. 04/01/15   Billy Fischer, MD  lisinopril (PRINIVIL,ZESTRIL) 10 MG tablet Take 10 mg by mouth daily.    Historical Provider, MD  oseltamivir (TAMIFLU) 75 MG capsule Take 1 capsule (75 mg total) by mouth every 12 (twelve) hours. Take all of medication. 04/01/15   Billy Fischer, MD  oxyCODONE-acetaminophen (PERCOCET/ROXICET) 5-325 MG per tablet Take 1-2 tablets by mouth every 6 (six) hours as needed for pain. 06/15/12   Blanchie Dessert, MD  simvastatin (ZOCOR) 10 MG tablet Take 10 mg by mouth daily.    Historical Provider, MD   Meds Ordered and Administered  this Visit   Medications  gi cocktail (Maalox,Lidocaine,Donnatal) (30 mLs Oral Given 04/01/15 1814)    BP 178/94 mmHg  Pulse 78  Temp(Src) 100.3 F (37.9 C) (Oral)  Resp 18  SpO2 93% No data found.   Physical Exam  Constitutional: He is oriented to person, place, and time. He appears well-developed and well-nourished. No distress.  HENT:  Right Ear: External ear normal.  Left Ear: External ear normal.  Mouth/Throat: Oropharynx is clear and moist.  Eyes: Pupils are equal, round, and reactive to light.  Neck: Normal range of motion. Neck supple.  Cardiovascular: Normal rate, normal heart sounds and intact distal pulses.   Pulmonary/Chest: Effort normal and breath sounds normal.  Abdominal: Soft. Bowel sounds are normal.  Musculoskeletal: He exhibits no edema.  Neurological: He is alert and oriented to person, place, and time.  Skin: Skin is warm and dry.  Nursing note and vitals reviewed.   ED Course  Procedures (including critical care time)  Labs Review Labs Reviewed - No data to display  Imaging Review No results found. X-rays reviewed and report per radiologist.   Visual Acuity Review  Right Eye Distance:   Left Eye Distance:   Bilateral Distance:    Right Eye Near:   Left Eye Near:    Bilateral Near:         MDM   1. Influenza-like illness       Billy Fischer, MD 04/11/15 2133

## 2015-04-01 NOTE — ED Notes (Signed)
Pt  Has   Symptoms  Of  Fever   Body    Aches         As  Well  As   Weakness     For 2  Days  Worse  Yesterday        Pt   Reports   Congestion  As   Well   Sudden  Onset of  Symptoms

## 2015-08-18 ENCOUNTER — Ambulatory Visit (HOSPITAL_COMMUNITY)
Admission: EM | Admit: 2015-08-18 | Discharge: 2015-08-18 | Disposition: A | Discharge status: Other Acute Inpatient Hospital | Payer: Medicare HMO | Attending: Family Medicine | Admitting: Family Medicine

## 2015-08-18 ENCOUNTER — Encounter (HOSPITAL_COMMUNITY): Payer: Self-pay | Admitting: *Deleted

## 2015-08-18 ENCOUNTER — Encounter (HOSPITAL_COMMUNITY): Payer: Self-pay | Admitting: Emergency Medicine

## 2015-08-18 ENCOUNTER — Emergency Department (HOSPITAL_COMMUNITY): Payer: Medicare HMO

## 2015-08-18 ENCOUNTER — Emergency Department (HOSPITAL_COMMUNITY)
Admission: EM | Admit: 2015-08-18 | Discharge: 2015-08-18 | Disposition: A | Discharge status: Home / Self Care | Payer: Medicare HMO | Attending: Emergency Medicine | Admitting: Emergency Medicine

## 2015-08-18 DIAGNOSIS — R072 Precordial pain: Secondary | ICD-10-CM

## 2015-08-18 DIAGNOSIS — I10 Essential (primary) hypertension: Secondary | ICD-10-CM | POA: Insufficient documentation

## 2015-08-18 DIAGNOSIS — R0789 Other chest pain: Secondary | ICD-10-CM | POA: Insufficient documentation

## 2015-08-18 DIAGNOSIS — Z7982 Long term (current) use of aspirin: Secondary | ICD-10-CM | POA: Insufficient documentation

## 2015-08-18 DIAGNOSIS — F17200 Nicotine dependence, unspecified, uncomplicated: Secondary | ICD-10-CM | POA: Diagnosis not present

## 2015-08-18 LAB — CBC
HCT: 38.6 % — ABNORMAL LOW (ref 39.0–52.0)
HEMOGLOBIN: 12.7 g/dL — AB (ref 13.0–17.0)
MCH: 30.7 pg (ref 26.0–34.0)
MCHC: 32.9 g/dL (ref 30.0–36.0)
MCV: 93.2 fL (ref 78.0–100.0)
PLATELETS: 248 10*3/uL (ref 150–400)
RBC: 4.14 MIL/uL — ABNORMAL LOW (ref 4.22–5.81)
RDW: 12.4 % (ref 11.5–15.5)
WBC: 3.8 10*3/uL — ABNORMAL LOW (ref 4.0–10.5)

## 2015-08-18 LAB — I-STAT TROPONIN, ED: TROPONIN I, POC: 0 ng/mL (ref 0.00–0.08)

## 2015-08-18 LAB — COMPREHENSIVE METABOLIC PANEL
ALK PHOS: 62 U/L (ref 38–126)
ALT: 14 U/L — AB (ref 17–63)
ANION GAP: 6 (ref 5–15)
AST: 16 U/L (ref 15–41)
Albumin: 3.6 g/dL (ref 3.5–5.0)
BUN: 11 mg/dL (ref 6–20)
CALCIUM: 9.1 mg/dL (ref 8.9–10.3)
CO2: 23 mmol/L (ref 22–32)
CREATININE: 1.15 mg/dL (ref 0.61–1.24)
Chloride: 107 mmol/L (ref 101–111)
Glucose, Bld: 93 mg/dL (ref 65–99)
Potassium: 3.9 mmol/L (ref 3.5–5.1)
SODIUM: 136 mmol/L (ref 135–145)
Total Bilirubin: 0.6 mg/dL (ref 0.3–1.2)
Total Protein: 7 g/dL (ref 6.5–8.1)

## 2015-08-18 LAB — D-DIMER, QUANTITATIVE (NOT AT ARMC): D DIMER QUANT: 0.29 ug{FEU}/mL (ref 0.00–0.50)

## 2015-08-18 MED ORDER — NITROGLYCERIN 0.4 MG SL SUBL
SUBLINGUAL_TABLET | SUBLINGUAL | Status: AC
  Filled 2015-08-18: qty 1

## 2015-08-18 MED ORDER — KETOROLAC TROMETHAMINE 60 MG/2ML IM SOLN
30.0000 mg | Freq: Once | INTRAMUSCULAR | Status: AC
  Administered 2015-08-18: 30 mg via INTRAMUSCULAR
  Filled 2015-08-18: qty 2

## 2015-08-18 MED ORDER — ASPIRIN 81 MG PO CHEW
CHEWABLE_TABLET | ORAL | Status: AC
  Filled 2015-08-18: qty 4

## 2015-08-18 MED ORDER — NAPROXEN 500 MG PO TABS: 500.0000 mg | ORAL_TABLET | Freq: Two times a day (BID) | ORAL | 0 refills | Status: DC

## 2015-08-18 MED ORDER — ASPIRIN 81 MG PO CHEW
324.0000 mg | CHEWABLE_TABLET | Freq: Once | ORAL | Status: AC
  Administered 2015-08-18: 324 mg via ORAL

## 2015-08-18 MED ORDER — NITROGLYCERIN 0.4 MG SL SUBL
0.4000 mg | SUBLINGUAL_TABLET | SUBLINGUAL | Status: DC | PRN
  Administered 2015-08-18: 0.4 mg via SUBLINGUAL

## 2015-08-18 NOTE — ED Notes (Signed)
Report    Phoned   To  Leslie Livingston  Nurse

## 2015-08-18 NOTE — ED Provider Notes (Signed)
[facility] DEPT Provider Note   CSN: [id number] Arrival date & time: 08/18/15  1247  First Provider Contact:  First MD Initiated Contact with Patient 08/18/15 1425        History   Chief Complaint Chief Complaint  Patient presents with  . Chest Pain    HPI Leslie Livingston is a 65 y.o. male.  The history is provided by the patient.  Chest Pain   This is a chronic problem. Episode onset: 4 weeks. The problem occurs constantly. Progression since onset: waxes and wanes in severity but worse today. Associated with: Past start spontaneously but is occasionally worse with deep breaths and lifting. Pain location: Right of the substernal region. The pain is at a severity of 5/10. The pain is moderate. The quality of the pain is described as dull (Cramping and occasionally pleuritic). The pain does not radiate. The symptoms are aggravated by deep breathing. Pertinent negatives include no abdominal pain, no cough, no dizziness, no fever, no irregular heartbeat, no lower extremity edema, no nausea, no near-syncope, no palpitations, no shortness of breath, no vomiting and no weakness. He has tried antacids for the symptoms. The treatment provided no relief. Risk factors include male gender and smoking/tobacco exposure.  His past medical history is significant for hypertension.  Pertinent negatives for past medical history include no MI.    Past Medical History:  Diagnosis Date  . Hypertension     Patient Active Problem List   Diagnosis Date Noted  . ERECTILE DYSFUNCTION 06/06/2007  . ANEMIA-NOS 12/16/2006  . HYPERTENSION 12/16/2006  . HEMORRHOIDS, EXTERNAL 12/16/2006    History reviewed. No pertinent surgical history.     Home Medications    Prior to Admission medications   Medication Sig Start Date End Date Taking? Authorizing Provider  aspirin EC 81 MG tablet Take 81 mg by mouth daily.   Yes Historical Provider, MD  lisinopril (PRINIVIL,ZESTRIL) 10 MG tablet Take 10 mg by  mouth daily.   Yes Historical Provider, MD  meloxicam (MOBIC) 15 MG tablet Take 15 mg by mouth daily. 06/11/15  Yes Historical Provider, MD    Family History Family History  Problem Relation Age of Onset  . Colon cancer Neg Hx   . Stomach cancer Neg Hx     Social History Social History  Substance Use Topics  . Smoking status: Current Some Day Smoker  . Smokeless tobacco: Never Used  . Alcohol use 3.6 oz/week    6 Cans of beer per week     Allergies   Review of patient's allergies indicates no known allergies.   Review of Systems Review of Systems  Constitutional: Negative for fever.  Respiratory: Negative for cough and shortness of breath.   Cardiovascular: Positive for chest pain. Negative for palpitations and near-syncope.  Gastrointestinal: Negative for abdominal pain, nausea and vomiting.  Neurological: Negative for dizziness and weakness.  All other systems reviewed and are negative.    Physical Exam Updated Vital Signs BP 181/99 (BP Location: Right Arm)   Pulse (!) 53   Temp 98.9 F (37.2 C) (Oral) Comment: Simultaneous filing. User may not have seen previous data. Comment (Src): Simultaneous filing. User may not have seen previous data.  Resp 17   Ht 6\' 1"  (1.854 m)   Wt 225 lb (102.1 kg)   SpO2 96%   BMI 29.69 kg/m   Physical Exam  Constitutional: He is oriented to person, place, and time. He appears well-developed and well-nourished. No distress.  HENT:  Head:  Normocephalic and atraumatic.  Mouth/Throat: Oropharynx is clear and moist.  Eyes: Conjunctivae and EOM are normal. Pupils are equal, round, and reactive to light.  Neck: Normal range of motion. Neck supple.  Cardiovascular: Normal rate, regular rhythm and intact distal pulses.   No murmur heard. Pulmonary/Chest: Effort normal and breath sounds normal. No respiratory distress. He has no wheezes. He has no rales. He exhibits no tenderness.  Abdominal: Soft. He exhibits no distension. There is no  tenderness. There is no rebound and no guarding.  Musculoskeletal: Normal range of motion. He exhibits no edema or tenderness.  Neurological: He is alert and oriented to person, place, and time.  Skin: Skin is warm and dry. No rash noted. No erythema.  Psychiatric: He has a normal mood and affect. His behavior is normal.  Nursing note and vitals reviewed.    ED Treatments / Results  Labs (all labs ordered are listed, but only abnormal results are displayed) Labs Reviewed  CBC - Abnormal; Notable for the following:       Result Value   WBC 3.8 (*)    RBC 4.14 (*)    Hemoglobin 12.7 (*)    HCT 38.6 (*)    All other components within normal limits  COMPREHENSIVE METABOLIC PANEL - Abnormal; Notable for the following:    ALT 14 (*)    All other components within normal limits  D-DIMER, QUANTITATIVE (NOT AT [facility])  I-STAT TROPOININ, ED    EKG  EKG Interpretation  Date/Time:  Monday August 18 2015 12:48:22 EDT Ventricular Rate:  62 PR Interval:    QRS Duration: 85 QT Interval:  397 QTC Calculation: 404 R Axis:   56 Text Interpretation:  Sinus rhythm No significant change since last tracing Confirmed by Maryan Rued  MD, Loree Fee ([id number]) on 08/18/2015 1:08:43 PM       Radiology Dg Chest 2 View  Result Date: 08/18/2015 CLINICAL DATA:  Substernal chest pain intermittently for 4 weeks, more severe this morning. EXAM: CHEST  2 VIEW COMPARISON:  04/01/2015 FINDINGS: The cardiac silhouette is normal in size and configuration. No mediastinal or hilar masses or evidence of adenopathy. Clear lungs. No pleural effusion or pneumothorax. Bony thorax is intact. IMPRESSION: No active cardiopulmonary disease. Electronically Signed   By: Lajean Manes M.D.   On: 08/18/2015 13:42   Procedures Procedures (including critical care time)  Medications Ordered in ED Medications  ketorolac (TORADOL) injection 30 mg (not administered)     Initial Impression / Assessment and Plan / ED Course  I have  reviewed the triage vital signs and the nursing notes.  Pertinent labs & imaging results that were available during my care of the patient were reviewed by me and considered in my medical decision making (see chart for details).  Clinical Course    Patient is a 80 are old male with a history of 4 weeks of chest pain which he describes as dull and achy on the right side of his chest. He does not have associated symptoms with this but nothing seems to make it better. He saw his PCP a few weeks ago and was given medication for reflux which did not help the pain. He states it is not associated with activity. He mowed his yard without any shortness of breath or worsening of pain. It is not associated with eating. He denies any recent URI symptoms but does admit to being a smoker. Sometimes the pain is worse with deep breathing. On exam patient is well-appearing. He  does have a history of hypertension and is been hypertensive here. He was initially seen at urgent care and sent here because of an abnormal EKG. Upon further evaluation patient's EKG is unchanged from 2012. He is in no acute distress at this time. Labs including a troponin and d-dimer within normal limits. Chest x-ray without acute findings. Low suspicion for PE, dissection or ACS. At this time will treat with anti-inflammatories as this is most likely chest wall pain however given strict instructions to follow-up with his PCP if symptoms do not improve.  Final Clinical Impressions(s) / ED Diagnoses   Final diagnoses:  Chest wall pain    New Prescriptions New Prescriptions   NAPROXEN (NAPROSYN) 500 MG TABLET    Take 1 tablet (500 mg total) by mouth 2 (two) times daily.     Blanchie Dessert, MD 08/18/15 [phone]

## 2015-08-18 NOTE — ED Notes (Signed)
MD at bedside. 

## 2015-08-18 NOTE — ED Triage Notes (Addendum)
PT reports right sided chest pain for a month. PT transferred to ED by [facility]. PT reports the pain was worse this AM, but not different. PT describes pain as a soreness that is intermittent, but it never completely goes away. PT reports it feels like muscle soreness, but he has no known injury or strain. PT denies that he is tender to palpation and reports no additional pain with deep breath. [facility] gave a nitro with no relief

## 2015-08-18 NOTE — ED Notes (Signed)
Patient verbalized understanding of discharge instructions and denies any further needs or questions at this time. VS stable. Patient ambulatory with steady gait.  

## 2015-08-18 NOTE — ED Notes (Signed)
Care transferred to [facility], [place]

## 2015-08-18 NOTE — ED Provider Notes (Signed)
CSN: [id number]     Arrival date & time 08/18/15  1109 History   None    Chief Complaint  Patient presents with  . Chest Pain   (Consider location/radiation/quality/duration/timing/severity/associated sxs/prior Treatment) Patient presents with substernal chest pain 5 on scale of 1-10.  He states the pain was worse this am when getting out of bed and was 10 on 1-10 scale.  He states he has had pain in chest for last 4 weeks that comes and goes.  He states the chest pain is worse with activity.  He denies any nausea or diaphoresis or palpitations.   The history is provided by the patient.  Chest Pain  Pain location:  Substernal area Pain quality: aching   Pain radiates to:  Does not radiate Pain severity:  Moderate Duration:  4 hours Timing:  Constant Chronicity:  Recurrent Context: movement   Worsened by:  Nothing Ineffective treatments:  None tried Risk factors: hypertension     Past Medical History:  Diagnosis Date  . Hypertension    History reviewed. No pertinent surgical history. Family History  Problem Relation Age of Onset  . Colon cancer Neg Hx   . Stomach cancer Neg Hx    Social History  Substance Use Topics  . Smoking status: Current Some Day Smoker  . Smokeless tobacco: Never Used  . Alcohol use 3.6 oz/week    6 Cans of beer per week    Review of Systems  Constitutional: Negative.   HENT: Negative.   Eyes: Negative.   Respiratory: Negative.   Cardiovascular: Positive for chest pain.  Gastrointestinal: Negative.   Endocrine: Negative.   Genitourinary: Negative.   Musculoskeletal: Negative.   Skin: Negative.   Allergic/Immunologic: Negative.   Neurological: Negative.   Hematological: Negative.     Allergies  Review of patient's allergies indicates no known allergies.  Home Medications   Prior to Admission medications   Medication Sig Start Date End Date Taking? Authorizing Provider  guaiFENesin-codeine (ROBITUSSIN AC) 100-10 MG/5ML syrup Take  10 mLs by mouth 4 (four) times daily as needed for cough. 04/01/15   Billy Fischer, MD  lisinopril (PRINIVIL,ZESTRIL) 10 MG tablet Take 10 mg by mouth daily.    Historical Provider, MD  oseltamivir (TAMIFLU) 75 MG capsule Take 1 capsule (75 mg total) by mouth every 12 (twelve) hours. Take all of medication. 04/01/15   Billy Fischer, MD  oxyCODONE-acetaminophen (PERCOCET/ROXICET) 5-325 MG per tablet Take 1-2 tablets by mouth every 6 (six) hours as needed for pain. 06/15/12   Blanchie Dessert, MD  simvastatin (ZOCOR) 10 MG tablet Take 10 mg by mouth daily.    Historical Provider, MD   Meds Ordered and Administered this Visit   Medications  aspirin chewable tablet 324 mg (not administered)  nitroGLYCERIN (NITROSTAT) SL tablet 0.4 mg (not administered)    BP 190/94 (BP Location: Left Arm)   Pulse 66   Temp 98.1 F (36.7 C) (Oral)   Resp 18   SpO2 97%  No data found.   Physical Exam  Constitutional: He appears well-developed and well-nourished.  HENT:  Head: Normocephalic and atraumatic.  Eyes: Conjunctivae and EOM are normal. Pupils are equal, round, and reactive to light.  Neck: Normal range of motion.  Cardiovascular: Normal rate, regular rhythm and normal heart sounds.   Pulmonary/Chest: Effort normal and breath sounds normal.  Abdominal: Soft. Bowel sounds are normal.  Musculoskeletal: Normal range of motion.    Urgent Care Course   Clinical Course  Procedures (including critical care time)  Labs Review Labs Reviewed - No data to display  Imaging Review No results found.   Visual Acuity Review  Right Eye Distance:   Left Eye Distance:   Bilateral Distance:    Right Eye Near:   Left Eye Near:    Bilateral Near:         MDM   1. Precordial pain    EKG with NSR with ST elevation in lateral leads and flipped ST's in inferior leads.  NTG 0.4mg  SL ASA 324mg    Transfer via EMS to [facility] ED    Lysbeth Penner, FNP 08/18/15 [place],  [place] 08/18/15 [phone]

## 2015-08-18 NOTE — ED Triage Notes (Signed)
Pt  Reports  Chest  Pain   For  About  1  Month      Worse   Last  Week   Pt  Reports    Seen  By pcp    Last  Week   And  Was  Given       meds      For     Poss  Reflux

## 2015-08-18 NOTE — ED Notes (Signed)
Iv  Ns   tko   18  Angio    l  Hand  1  Att

## 2015-08-18 NOTE — ED Notes (Signed)
Placed  On  Cardiac  Monitor   Nasal  o2  At  2  l  /  Min  

## 2016-05-03 ENCOUNTER — Ambulatory Visit (HOSPITAL_COMMUNITY)
Admission: EM | Admit: 2016-05-03 | Discharge: 2016-05-03 | Disposition: A | Discharge status: Home / Self Care | Payer: Medicare HMO | Attending: Internal Medicine | Admitting: Internal Medicine

## 2016-05-03 ENCOUNTER — Encounter (HOSPITAL_COMMUNITY): Payer: Self-pay | Admitting: *Deleted

## 2016-05-03 DIAGNOSIS — R0981 Nasal congestion: Secondary | ICD-10-CM | POA: Diagnosis not present

## 2016-05-03 DIAGNOSIS — F17200 Nicotine dependence, unspecified, uncomplicated: Secondary | ICD-10-CM | POA: Insufficient documentation

## 2016-05-03 DIAGNOSIS — I10 Essential (primary) hypertension: Secondary | ICD-10-CM | POA: Diagnosis not present

## 2016-05-03 DIAGNOSIS — R42 Dizziness and giddiness: Secondary | ICD-10-CM | POA: Insufficient documentation

## 2016-05-03 DIAGNOSIS — R001 Bradycardia, unspecified: Secondary | ICD-10-CM | POA: Diagnosis not present

## 2016-05-03 LAB — CBC WITH DIFFERENTIAL/PLATELET
BASOS ABS: 0 10*3/uL (ref 0.0–0.1)
BASOS PCT: 1 %
EOS ABS: 0.2 10*3/uL (ref 0.0–0.7)
EOS PCT: 4 %
HCT: 39.7 % (ref 39.0–52.0)
Hemoglobin: 13.2 g/dL (ref 13.0–17.0)
LYMPHS PCT: 50 %
Lymphs Abs: 2.2 10*3/uL (ref 0.7–4.0)
MCH: 31.1 pg (ref 26.0–34.0)
MCHC: 33.2 g/dL (ref 30.0–36.0)
MCV: 93.6 fL (ref 78.0–100.0)
MONO ABS: 0.3 10*3/uL (ref 0.1–1.0)
Monocytes Relative: 7 %
Neutro Abs: 1.6 10*3/uL — ABNORMAL LOW (ref 1.7–7.7)
Neutrophils Relative %: 38 %
PLATELETS: 306 10*3/uL (ref 150–400)
RBC: 4.24 MIL/uL (ref 4.22–5.81)
RDW: 12.4 % (ref 11.5–15.5)
WBC: 4.3 10*3/uL (ref 4.0–10.5)

## 2016-05-03 LAB — COMPREHENSIVE METABOLIC PANEL
ALT: 17 U/L (ref 17–63)
AST: 19 U/L (ref 15–41)
Albumin: 4.1 g/dL (ref 3.5–5.0)
Alkaline Phosphatase: 72 U/L (ref 38–126)
Anion gap: 7 (ref 5–15)
BUN: 11 mg/dL (ref 6–20)
CHLORIDE: 104 mmol/L (ref 101–111)
CO2: 25 mmol/L (ref 22–32)
Calcium: 9.9 mg/dL (ref 8.9–10.3)
Creatinine, Ser: 1.1 mg/dL (ref 0.61–1.24)
GFR calc Af Amer: >60 mL/min (ref >60)
Glucose, Bld: 103 mg/dL — ABNORMAL HIGH (ref 65–99)
POTASSIUM: 4.6 mmol/L (ref 3.5–5.1)
SODIUM: 136 mmol/L (ref 135–145)
Total Bilirubin: 0.5 mg/dL (ref 0.3–1.2)
Total Protein: 7.7 g/dL (ref 6.5–8.1)

## 2016-05-03 LAB — GLUCOSE, CAPILLARY: GLUCOSE-CAPILLARY: 115 mg/dL — AB (ref 65–99)

## 2016-05-03 MED ORDER — TRIAMCINOLONE ACETONIDE 55 MCG/ACT NA AERO: 2.0000 | INHALATION_SPRAY | Freq: Every day | NASAL | 0 refills | Status: DC

## 2016-05-03 MED ORDER — PREDNISONE 50 MG PO TABS: 50.0000 mg | ORAL_TABLET | Freq: Every day | ORAL | 0 refills | Status: DC

## 2016-05-03 NOTE — ED Triage Notes (Signed)
Symptoms  Of   dizzyness   No  Nausea   No  Vomiting         Symptoms  X  3  Weeks     Ambulates   With  A  Steady  Fluid  Gait

## 2016-05-03 NOTE — Discharge Instructions (Addendum)
ECG, orthostatic vital signs, and blood work (blood counts, liver and kidney tests) were unremarkable today.  On exam, there was evidence of a lot of head congestion.  Prescriptions for prednisone and nasal steroid spray were sent to the [facility] on [address].  Followup with Dr Jackson Latino to discuss further evaluation (heart monitor test) for slightly slow heart rate.

## 2016-05-03 NOTE — ED Provider Notes (Signed)
[place]    CSN: [id number] Arrival date & time: 05/03/16  1038     History   Chief Complaint Chief Complaint  Patient presents with  . Dizziness    HPI Leslie Livingston is a 66 y.o. male. He presents today with three-week history of dizziness, he describes this as swimmy headedness, noticeable when he is up and walking. When he is sitting down he doesn't have any difficulty. He has not fallen down. No prior history of this symptom. Little bit of throat clearing, congestion, but not severe. Denies recent med changes. No chest pain, no shortness of breath. No fever. No nausea, vomiting. No change in his bowels, says he stays constipated.    HPI  Past Medical History:  Diagnosis Date  . Hypertension     Patient Active Problem List   Diagnosis Date Noted  . ERECTILE DYSFUNCTION 06/06/2007  . ANEMIA-NOS 12/16/2006  . HYPERTENSION 12/16/2006  . HEMORRHOIDS, EXTERNAL 12/16/2006    History reviewed. No pertinent surgical history.     Home Medications    Prior to Admission medications   Medication Sig Start Date End Date Taking? Authorizing Provider  aspirin EC 81 MG tablet Take 81 mg by mouth daily.    Historical Provider, MD  lisinopril (PRINIVIL,ZESTRIL) 10 MG tablet Take 10 mg by mouth daily. Taking  Additional  5  Mg   daily    Historical Provider, MD  meloxicam (MOBIC) 15 MG tablet Take 15 mg by mouth daily. 06/11/15   Historical Provider, MD  naproxen (NAPROSYN) 500 MG tablet Take 1 tablet (500 mg total) by mouth 2 (two) times daily. 08/18/15   Blanchie Dessert, MD  predniSONE (DELTASONE) 50 MG tablet Take 1 tablet (50 mg total) by mouth daily. 05/03/16   Sherlene Shams, MD  triamcinolone (NASACORT AQ) 55 MCG/ACT AERO nasal inhaler Place 2 sprays into the nose daily. 05/03/16   Sherlene Shams, MD    Family History Family History  Problem Relation Age of Onset  . Colon cancer Neg Hx   . Stomach cancer Neg Hx     Social History Social History    Substance Use Topics  . Smoking status: Current Some Day Smoker  . Smokeless tobacco: Never Used  . Alcohol use 3.6 oz/week    6 Cans of beer per week     Allergies   Patient has no known allergies.   Review of Systems Review of Systems  All other systems reviewed and are negative.    Physical Exam Triage Vital Signs ED Triage Vitals [05/03/16 1157]  Enc Vitals Group     BP 125/66     Pulse Rate 60     Resp 12     Temp 98.6 F (37 C)     Temp Source Oral     SpO2 98 %     Weight      Height      Pain Score      Pain Loc    Updated Vital Signs BP 125/66 (BP Location: Right Arm)   Pulse 60   Temp 98.6 F (37 C) (Oral)   Resp 12   SpO2 98%   Physical Exam  Constitutional: He is oriented to person, place, and time. No distress.  Alert, nicely groomed  HENT:  Head: Atraumatic.  Bilateral TMs are moderately dull, no erythema Marked nasal congestion and bogginess bilaterally, scant mucousy material present Throat with some postnasal drainage evident  Eyes:  Conjugate gaze, no  eye redness/drainage  Neck: Neck supple.  Cardiovascular: Regular rhythm.   Heart rate 50s  Pulmonary/Chest: No respiratory distress. He has no wheezes. He has no rales.  Lungs clear, symmetric breath sounds   Abdominal: He exhibits no distension.  Musculoskeletal: Normal range of motion.  Neurological: He is alert and oriented to person, place, and time.  Able to walk into the urgent care independently, climb on/off the exam table Face is symmetric, speech is clear/coherent  Skin: Skin is warm and dry.  No cyanosis  Nursing note and vitals reviewed.    UC Treatments / Results  Labs Results for orders placed or performed during the hospital encounter of 05/03/16  Glucose, capillary  Result Value Ref Range   Glucose-Capillary 115 (H) 65 - 99 mg/dL  CBC with Differential  Result Value Ref Range   WBC 4.3 4.0 - 10.5 K/uL   RBC 4.24 4.22 - 5.81 MIL/uL   Hemoglobin 13.2 13.0 -  17.0 g/dL   HCT 39.7 39.0 - 52.0 %   MCV 93.6 78.0 - 100.0 fL   MCH 31.1 26.0 - 34.0 pg   MCHC 33.2 30.0 - 36.0 g/dL   RDW 12.4 11.5 - 15.5 %   Platelets 306 150 - 400 K/uL   Neutrophils Relative % 38 %   Neutro Abs 1.6 (L) 1.7 - 7.7 K/uL   Lymphocytes Relative 50 %   Lymphs Abs 2.2 0.7 - 4.0 K/uL   Monocytes Relative 7 %   Monocytes Absolute 0.3 0.1 - 1.0 K/uL   Eosinophils Relative 4 %   Eosinophils Absolute 0.2 0.0 - 0.7 K/uL   Basophils Relative 1 %   Basophils Absolute 0.0 0.0 - 0.1 K/uL  Comprehensive metabolic panel  Result Value Ref Range   Sodium 136 135 - 145 mmol/L   Potassium 4.6 3.5 - 5.1 mmol/L   Chloride 104 101 - 111 mmol/L   CO2 25 22 - 32 mmol/L   Glucose, Bld 103 (H) 65 - 99 mg/dL   BUN 11 6 - 20 mg/dL   Creatinine, Ser 1.10 0.61 - 1.24 mg/dL   Calcium 9.9 8.9 - 10.3 mg/dL   Total Protein 7.7 6.5 - 8.1 g/dL   Albumin 4.1 3.5 - 5.0 g/dL   AST 19 15 - 41 U/L   ALT 17 17 - 63 U/L   Alkaline Phosphatase 72 38 - 126 U/L   Total Bilirubin 0.5 0.3 - 1.2 mg/dL   GFR calc non Af Amer >60 >60 mL/min   GFR calc Af Amer >60 >60 mL/min   Anion gap 7 5 - 15    ECG:  Sinus rhythm, no acute appearing ST or T wave changes. No rhythm disturbance. Unchanged since ECG of 7.26.17.    Procedures Procedures (including critical care time) None today   Final Clinical Impressions(s) / UC Diagnoses   Final diagnoses:  Dizziness  Sinus bradycardia  Sinus congestion   ECG, orthostatic vital signs, and blood work (blood counts, liver and kidney tests) were unremarkable today.  On exam, there was evidence of a lot of head congestion.  Prescriptions for prednisone and nasal steroid spray were sent to the [facility] on [address].  Followup with Dr Jackson Latino to discuss further evaluation (heart monitor test) for slightly slow heart rate.    New Prescriptions Discharge Medication List as of 05/03/2016  1:59 PM    START taking these medications   Details  predniSONE  (DELTASONE) 50 MG tablet Take 1 tablet (50 mg total)  by mouth daily., Starting Mon 05/03/2016, Normal    triamcinolone (NASACORT AQ) 55 MCG/ACT AERO nasal inhaler Place 2 sprays into the nose daily., Starting Mon 05/03/2016, Normal         Sherlene Shams, MD 05/05/16 2256

## 2017-07-18 ENCOUNTER — Encounter: Payer: Self-pay | Admitting: Gastroenterology

## 2017-09-13 ENCOUNTER — Encounter: Payer: Self-pay | Admitting: Gastroenterology

## 2017-10-27 ENCOUNTER — Ambulatory Visit (AMBULATORY_SURGERY_CENTER): Payer: Self-pay

## 2017-10-27 ENCOUNTER — Encounter: Payer: Self-pay | Admitting: Gastroenterology

## 2017-10-27 VITALS — Ht 73.5 in | Wt 235.4 lb

## 2017-10-27 DIAGNOSIS — Z86010 Personal history of colonic polyps: Secondary | ICD-10-CM

## 2017-10-27 MED ORDER — NA SULFATE-K SULFATE-MG SULF 17.5-3.13-1.6 GM/177ML PO SOLN: 1.0000 | Freq: Once | ORAL | 0 refills | Status: AC

## 2017-10-27 NOTE — Progress Notes (Signed)
Denies allergies to eggs or soy products. Denies complication of anesthesia or sedation. Denies use of weight loss medication. Denies use of O2.   Emmi instructions declined.  

## 2017-11-07 ENCOUNTER — Telehealth: Payer: Self-pay | Admitting: Gastroenterology

## 2017-11-07 MED ORDER — NA SULFATE-K SULFATE-MG SULF 17.5-3.13-1.6 GM/177ML PO SOLN: 1.0000 | Freq: Once | ORAL | 0 refills | Status: AC

## 2017-11-07 NOTE — Telephone Encounter (Signed)
Pt states he cannot afford $45 for his Suprep --  His procedure is Wednesday   Sample Suprep  Lot [id number]  Exp 8/21 as directed Pt will pick up 3rd floor Tuesday morning  Lelan Pons PV

## 2017-11-07 NOTE — Telephone Encounter (Signed)
Spoke with pt. He is requesting the Suprep be sent to the [facility] on [address] in [place].I sent a new Rx for Suprep to [facility].  Informed the pt to call back with any other questions. He understood. Gwyndolyn Saxon

## 2017-11-07 NOTE — Telephone Encounter (Signed)
Patient states the prep was $45 and that is still too expensive. Patient wanting to know how to get the prep cheaper. Pt procedure is this Wednesday.

## 2017-11-09 ENCOUNTER — Ambulatory Visit (AMBULATORY_SURGERY_CENTER): Payer: Medicare HMO | Admitting: Gastroenterology

## 2017-11-09 ENCOUNTER — Encounter: Payer: Self-pay | Admitting: Gastroenterology

## 2017-11-09 VITALS — BP 113/80 | HR 57 | Temp 97.3°F | Resp 10 | Ht 73.5 in | Wt 235.0 lb

## 2017-11-09 DIAGNOSIS — Z86010 Personal history of colonic polyps: Secondary | ICD-10-CM

## 2017-11-09 MED ORDER — SODIUM CHLORIDE 0.9 % IV SOLN: 500.0000 mL | Freq: Once | INTRAVENOUS | Status: DC

## 2017-11-09 NOTE — Op Note (Signed)
[place] Patient Name: Leslie Livingston Procedure Date: 11/09/2017 12:06 PM MRN: [id number] Endoscopist: Ladene Artist , MD Age: 67 Referring MD:  Date of Birth: 18-Apr-1950 Gender: Male Account #: [id number] Procedure:                Colonoscopy Indications:              Surveillance: Personal history of adenomatous                            polyps on last colonoscopy 5 years ago Medicines:                Monitored Anesthesia Care Procedure:                Pre-Anesthesia Assessment:                           - Prior to the procedure, a History and Physical                            was performed, and patient medications and                            allergies were reviewed. The patient's tolerance of                            previous anesthesia was also reviewed. The risks                            and benefits of the procedure and the sedation                            options and risks were discussed with the patient.                            All questions were answered, and informed consent                            was obtained. Prior Anticoagulants: The patient has                            taken no previous anticoagulant or antiplatelet                            agents. ASA Grade Assessment: II - A patient with                            mild systemic disease. After reviewing the risks                            and benefits, the patient was deemed in                            satisfactory condition to undergo the procedure.  After obtaining informed consent, the colonoscope                            was passed under direct vision. Throughout the                            procedure, the patient's blood pressure, pulse, and                            oxygen saturations were monitored continuously. The                            Colonoscope was introduced through the anus and                            advanced to the the  cecum, identified by                            appendiceal orifice and ileocecal valve. The                            ileocecal valve, appendiceal orifice, and rectum                            were photographed. The quality of the bowel                            preparation was inadequate. The colonoscopy was                            performed without difficulty. The patient tolerated                            the procedure well. Scope In: 12:14:48 PM Scope Out: 12:25:46 PM Scope Withdrawal Time: 0 hours 8 minutes 20 seconds  Total Procedure Duration: 0 hours 10 minutes 58 seconds  Findings:                 The perianal and digital rectal examinations were                            normal.                           A large amount of solid stool was found in the                            entire colon, precluding visualization.                           Multiple medium-mouthed diverticula were found in                            the left colon. There was narrowing of the colon in  association with the diverticular opening. There                            was evidence of diverticular spasm. There was no                            evidence of diverticular bleeding.                           Many small-mouthed diverticula were found in the                            right colon. There was no evidence of diverticular                            bleeding.                           The exam was otherwise without abnormality on                            direct and retroflexion views.                           Internal hemorrhoids were found during                            retroflexion. The hemorrhoids were small and Grade                            I (internal hemorrhoids that do not prolapse). Complications:            No immediate complications. Estimated blood loss:                            None. Estimated Blood Loss:     Estimated blood loss:  none. Impression:               - Preparation of the colon was inadequate.                           - Stool in the entire examined colon.                           - Moderate diverticulosis in the left colon.                           - Mild diverticulosis in the right colon.                           - The examination was otherwise normal on direct                            and retroflexion views.                           -  Internal hemorrhoids.                           - No specimens collected. Recommendation:           - Repeat colonoscopy at appointment to be scheduled                            because the bowel preparation was poor with a 2 day                            bowel prep.                           - Patient has a contact number available for                            emergencies. The signs and symptoms of potential                            delayed complications were discussed with the                            patient. Return to normal activities tomorrow.                            Written discharge instructions were provided to the                            patient.                           - High fiber diet.                           - Continue present medications. Ladene Artist, MD 11/09/2017 12:34:28 PM This report has been signed electronically.

## 2017-11-09 NOTE — Patient Instructions (Signed)
Handouts:  High Fiber Diet and Diverticulosis   Need to reschedule an appointment with Dr. Fuller Plan to redo the procedure, need to do the 2 day prep.  YOU HAD AN ENDOSCOPIC PROCEDURE TODAY AT [facility]:   Refer to the procedure report that was given to you for any specific questions about what was found during the examination.  If the procedure report does not answer your questions, please call your gastroenterologist to clarify.  If you requested that your care partner not be given the details of your procedure findings, then the procedure report has been included in a sealed envelope for you to review at your convenience later.  YOU SHOULD EXPECT: Some feelings of bloating in the abdomen. Passage of more gas than usual.  Walking can help get rid of the air that was put into your GI tract during the procedure and reduce the bloating. If you had a lower endoscopy (such as a colonoscopy or flexible sigmoidoscopy) you may notice spotting of blood in your stool or on the toilet paper. If you underwent a bowel prep for your procedure, you may not have a normal bowel movement for a few days.  Please Note:  You might notice some irritation and congestion in your nose or some drainage.  This is from the oxygen used during your procedure.  There is no need for concern and it should clear up in a day or so.  SYMPTOMS TO REPORT IMMEDIATELY:   Following lower endoscopy (colonoscopy or flexible sigmoidoscopy):  Excessive amounts of blood in the stool  Significant tenderness or worsening of abdominal pains  Swelling of the abdomen that is new, acute  Fever of 100F or higher  For urgent or emergent issues, a gastroenterologist can be reached at any hour by calling [phone].   DIET:  We do recommend a small meal at first, but then you may proceed to your regular diet.  Drink plenty of fluids but you should avoid alcoholic beverages for 24 hours.  ACTIVITY:  You should plan to take it  easy for the rest of today and you should NOT DRIVE or use heavy machinery until tomorrow (because of the sedation medicines used during the test).    FOLLOW UP: Our staff will call the number listed on your records the next business day following your procedure to check on you and address any questions or concerns that you may have regarding the information given to you following your procedure. If we do not reach you, we will leave a message.  However, if you are feeling well and you are not experiencing any problems, there is no need to return our call.  We will assume that you have returned to your regular daily activities without incident.  If any biopsies were taken you will be contacted by phone or by letter within the next 1-3 weeks.  Please call us at [phone] if you have not heard about the biopsies in 3 weeks.    SIGNATURES/CONFIDENTIALITY: You and/or your care partner have signed paperwork which will be entered into your electronic medical record.  These signatures attest to the fact that that the information above on your After Visit Summary has been reviewed and is understood.  Full responsibility of the confidentiality of this discharge information lies with you and/or your care-partner.

## 2017-11-09 NOTE — Progress Notes (Signed)
Patient needs to look at calendar to reschedule Colonoscopy.  Did not want to set up today per patient.

## 2017-11-09 NOTE — Progress Notes (Signed)
A/ox3 pleased with MAC, report to RN 

## 2017-11-10 ENCOUNTER — Telehealth: Payer: Self-pay

## 2017-11-10 NOTE — Telephone Encounter (Signed)
  Follow up Call-  Call back number 11/09/2017  Post procedure Call Back phone  # [phone]  Permission to leave phone message Yes  Some recent data might be hidden     Patient questions:  Do you have a fever, pain , or abdominal swelling? No. Pain Score  0 *  Have you tolerated food without any problems? Yes.    Have you been able to return to your normal activities? Yes.    Do you have any questions about your discharge instructions: Diet   No. Medications  No. Follow up visit  No.  Do you have questions or concerns about your Care? No.  Actions: * If pain score is 4 or above: No action needed, pain <4.

## 2018-05-02 ENCOUNTER — Observation Stay (HOSPITAL_COMMUNITY)
Admission: EM | Admit: 2018-05-02 | Discharge: 2018-05-03 | Disposition: A | Discharge status: Home / Self Care | Payer: Medicare HMO | Attending: Family Medicine | Admitting: Family Medicine

## 2018-05-02 ENCOUNTER — Encounter (HOSPITAL_COMMUNITY): Payer: Self-pay

## 2018-05-02 ENCOUNTER — Other Ambulatory Visit: Payer: Self-pay

## 2018-05-02 DIAGNOSIS — Z7982 Long term (current) use of aspirin: Secondary | ICD-10-CM | POA: Diagnosis not present

## 2018-05-02 DIAGNOSIS — D649 Anemia, unspecified: Secondary | ICD-10-CM

## 2018-05-02 DIAGNOSIS — K5731 Diverticulosis of large intestine without perforation or abscess with bleeding: Secondary | ICD-10-CM

## 2018-05-02 DIAGNOSIS — Z79899 Other long term (current) drug therapy: Secondary | ICD-10-CM | POA: Insufficient documentation

## 2018-05-02 DIAGNOSIS — E785 Hyperlipidemia, unspecified: Secondary | ICD-10-CM | POA: Diagnosis not present

## 2018-05-02 DIAGNOSIS — R42 Dizziness and giddiness: Secondary | ICD-10-CM | POA: Insufficient documentation

## 2018-05-02 DIAGNOSIS — Z86010 Personal history of colonic polyps: Secondary | ICD-10-CM | POA: Diagnosis not present

## 2018-05-02 DIAGNOSIS — N179 Acute kidney failure, unspecified: Secondary | ICD-10-CM | POA: Insufficient documentation

## 2018-05-02 DIAGNOSIS — K922 Gastrointestinal hemorrhage, unspecified: Secondary | ICD-10-CM | POA: Diagnosis not present

## 2018-05-02 DIAGNOSIS — K648 Other hemorrhoids: Secondary | ICD-10-CM | POA: Diagnosis not present

## 2018-05-02 DIAGNOSIS — I10 Essential (primary) hypertension: Secondary | ICD-10-CM | POA: Insufficient documentation

## 2018-05-02 DIAGNOSIS — D62 Acute posthemorrhagic anemia: Secondary | ICD-10-CM | POA: Diagnosis not present

## 2018-05-02 DIAGNOSIS — K5790 Diverticulosis of intestine, part unspecified, without perforation or abscess without bleeding: Secondary | ICD-10-CM | POA: Insufficient documentation

## 2018-05-02 DIAGNOSIS — M1990 Unspecified osteoarthritis, unspecified site: Secondary | ICD-10-CM | POA: Diagnosis not present

## 2018-05-02 DIAGNOSIS — F17200 Nicotine dependence, unspecified, uncomplicated: Secondary | ICD-10-CM | POA: Diagnosis not present

## 2018-05-02 DIAGNOSIS — R262 Difficulty in walking, not elsewhere classified: Secondary | ICD-10-CM | POA: Insufficient documentation

## 2018-05-02 DIAGNOSIS — Z7289 Other problems related to lifestyle: Secondary | ICD-10-CM | POA: Diagnosis not present

## 2018-05-02 DIAGNOSIS — Z9889 Other specified postprocedural states: Secondary | ICD-10-CM

## 2018-05-02 DIAGNOSIS — Z791 Long term (current) use of non-steroidal anti-inflammatories (NSAID): Secondary | ICD-10-CM | POA: Insufficient documentation

## 2018-05-02 DIAGNOSIS — K921 Melena: Secondary | ICD-10-CM | POA: Diagnosis not present

## 2018-05-02 LAB — PREPARE RBC (CROSSMATCH)

## 2018-05-02 LAB — CBC WITH DIFFERENTIAL/PLATELET
Abs Immature Granulocytes: 0.03 10*3/uL (ref 0.00–0.07)
Basophils Absolute: 0 10*3/uL (ref 0.0–0.1)
Basophils Relative: 1 %
Eosinophils Absolute: 0.1 10*3/uL (ref 0.0–0.5)
Eosinophils Relative: 2 %
HCT: 27.7 % — ABNORMAL LOW (ref 39.0–52.0)
Hemoglobin: 8.9 g/dL — ABNORMAL LOW (ref 13.0–17.0)
Immature Granulocytes: 1 %
Lymphocytes Relative: 29 %
Lymphs Abs: 1.8 10*3/uL (ref 0.7–4.0)
MCH: 31 pg (ref 26.0–34.0)
MCHC: 32.1 g/dL (ref 30.0–36.0)
MCV: 96.5 fL (ref 80.0–100.0)
Monocytes Absolute: 0.4 10*3/uL (ref 0.1–1.0)
Monocytes Relative: 7 %
Neutro Abs: 3.8 10*3/uL (ref 1.7–7.7)
Neutrophils Relative %: 60 %
Platelets: 226 10*3/uL (ref 150–400)
RBC: 2.87 MIL/uL — ABNORMAL LOW (ref 4.22–5.81)
RDW: 12.5 % (ref 11.5–15.5)
WBC: 6.2 10*3/uL (ref 4.0–10.5)
nRBC: 0 % (ref 0.0–0.2)

## 2018-05-02 LAB — COMPREHENSIVE METABOLIC PANEL
ALT: 13 U/L (ref 0–44)
AST: 13 U/L — ABNORMAL LOW (ref 15–41)
Albumin: 3.7 g/dL (ref 3.5–5.0)
Alkaline Phosphatase: 63 U/L (ref 38–126)
Anion gap: 5 (ref 5–15)
BUN: 27 mg/dL — ABNORMAL HIGH (ref 8–23)
CO2: 22 mmol/L (ref 22–32)
Calcium: 8.6 mg/dL — ABNORMAL LOW (ref 8.9–10.3)
Chloride: 113 mmol/L — ABNORMAL HIGH (ref 98–111)
Creatinine, Ser: 1.35 mg/dL — ABNORMAL HIGH (ref 0.61–1.24)
GFR calc Af Amer: >60 mL/min (ref >60)
GFR calc non Af Amer: 54 mL/min — ABNORMAL LOW (ref >60)
Glucose, Bld: 123 mg/dL — ABNORMAL HIGH (ref 70–99)
Potassium: 3.9 mmol/L (ref 3.5–5.1)
Sodium: 140 mmol/L (ref 135–145)
Total Bilirubin: 0.6 mg/dL (ref 0.3–1.2)
Total Protein: 7 g/dL (ref 6.5–8.1)

## 2018-05-02 LAB — PROTIME-INR
INR: 1 (ref 0.8–1.2)
Prothrombin Time: 13.2 seconds (ref 11.4–15.2)

## 2018-05-02 LAB — ABO/RH: ABO/RH(D): O POS

## 2018-05-02 LAB — POC OCCULT BLOOD, ED: Fecal Occult Bld: POSITIVE — AB

## 2018-05-02 MED ORDER — SODIUM CHLORIDE 0.9 % IV SOLN
10.0000 mL/h | Freq: Once | INTRAVENOUS | Status: AC
  Administered 2018-05-02: 10 mL/h via INTRAVENOUS

## 2018-05-02 MED ORDER — SIMVASTATIN 20 MG PO TABS
10.0000 mg | ORAL_TABLET | Freq: Every day | ORAL | Status: DC
  Administered 2018-05-02 – 2018-05-03 (×2): 10 mg via ORAL
  Filled 2018-05-02 (×3): qty 1

## 2018-05-02 MED ORDER — SODIUM CHLORIDE 0.9 % IV SOLN
8.0000 mg/h | INTRAVENOUS | Status: DC
  Administered 2018-05-02 – 2018-05-03 (×3): 8 mg/h via INTRAVENOUS
  Filled 2018-05-02 (×4): qty 80

## 2018-05-02 MED ORDER — SODIUM CHLORIDE 0.9 % IV SOLN
INTRAVENOUS | Status: DC
  Administered 2018-05-02 – 2018-05-03 (×3): via INTRAVENOUS

## 2018-05-02 MED ORDER — SODIUM CHLORIDE 0.9 % IV SOLN
80.0000 mg | Freq: Once | INTRAVENOUS | Status: AC
  Administered 2018-05-02: 80 mg via INTRAVENOUS
  Filled 2018-05-02: qty 80

## 2018-05-02 MED ORDER — SODIUM CHLORIDE 0.9 % IV SOLN
510.0000 mg | Freq: Once | INTRAVENOUS | Status: AC
  Administered 2018-05-03: 510 mg via INTRAVENOUS
  Filled 2018-05-02: qty 17

## 2018-05-02 NOTE — ED Triage Notes (Signed)
PER EMS: Pt from pcp, pt has copious rectal bleeding.  Bleeding started Sunday, and has increased since then.  Pt has no other c/o.

## 2018-05-02 NOTE — H&P (Signed)
History and Physical  FIONN STRACKE ZDG:[id number] DOB: [birth year] DOA: 05/02/2018  Referring physician: Dr Johnney Killian PCP: Nolene Ebbs, MD  Outpatient Specialists: [facility] Patient coming from: Home  Chief Complaint: Bright red blood per rectum for 3 days  HPI: BREYER TEJERA is a 67 y.o. male with medical history significant for hypertension, hyperlipidemia, diverticulosis, adenomatous polyps, internal hemorrhoids, who presented to [facility] ED per his PCPs recommendation due to persistent rectal bleeding of 3 days duration.  Patient reports sudden onset rectal bleeding 3 days ago.  Has noted bright red blood per rectum every time he has a bowel movement.  Has had 1/day in the last 3 days.  Denies prior history of GI bleed.  This morning he was feeling significantly lightheaded and went to see his primary care provider who recommended him to come to the ED for further evaluation.  Last colonoscopy was in October 2019 for which internal hemorrhoids were found.  No worsening or alleviating factors.  Denies abdominal pain nausea or vomiting.  Denies excessive use of NSAIDs.  Uses 1 baby aspirin daily as prescribed.  ED Course: Upon presentation to the ED, blood pressure is soft, lab studies remarkable for significant drop in hemoglobin from baseline 12 to 8 K with positive FOBT. GI consulted by ED provider and will see in consultation.  Review of Systems: Review of systems as noted in the HPI. All other systems reviewed and are negative.   Past Medical History:  Diagnosis Date  . Allergy   . Arthritis   . Hyperlipidemia   . Hypertension    History reviewed. No pertinent surgical history.  Social History:  reports that he has been smoking. He has never used smokeless tobacco. He reports current alcohol use of about 6.0 standard drinks of alcohol per week. He reports that he does not use drugs.   No Known Allergies  Family History  Problem Relation Age of Onset  . Colon cancer Neg  Hx   . Stomach cancer Neg Hx   . Esophageal cancer Neg Hx   . Rectal cancer Neg Hx       Prior to Admission medications   Medication Sig Start Date End Date Taking? Authorizing Provider  amLODipine (NORVASC) 5 MG tablet Take 5 mg by mouth daily.   Yes [provider]  aspirin EC 81 MG tablet Take 81 mg by mouth daily.   Yes [provider]  lisinopril (PRINIVIL,ZESTRIL) 20 MG tablet Take 20 mg by mouth daily. 02/23/18  Yes [provider]  meloxicam (MOBIC) 15 MG tablet Take 15 mg by mouth daily. 06/11/15  Yes [provider]  naproxen (NAPROSYN) 500 MG tablet Take 1 tablet (500 mg total) by mouth 2 (two) times daily. Patient not taking: Reported on 05/02/2018 08/18/15   Blanchie Dessert, MD  simvastatin (ZOCOR) 10 MG tablet Take 10 mg by mouth daily. 04/27/18   [provider]    Physical Exam: BP 94/64   Pulse 60   Temp 97.7 F (36.5 C) (Oral)   Resp 14   Ht 6\' 1"  (1.854 m)   Wt 106.6 kg   SpO2 93%   BMI 31.00 kg/m   . General: 68 y.o. year-old male well developed well nourished in no acute distress.  Alert and oriented x3. . Cardiovascular: Regular rate and rhythm with no rubs or gallops.  No thyromegaly or JVD noted.  No lower extremity edema. 2/4 pulses in all 4 extremities. Marland Kitchen Respiratory: Clear to auscultation with no  wheezes or rales. Good inspiratory effort. . Abdomen: Soft nontender nondistended with normal bowel sounds x4 quadrants. . Muskuloskeletal: No cyanosis, clubbing or edema noted bilaterally . Neuro: CN II-XII intact, strength, sensation, reflexes . Skin: No ulcerative lesions noted or rashes . Psychiatry: Judgement and insight appear normal. Mood is appropriate for condition and setting          Labs on Admission:  Basic Metabolic Panel: Recent Labs  Lab 05/02/18 1147  NA 140  K 3.9  CL 113*  CO2 22  GLUCOSE 123*  BUN 27*  CREATININE 1.35*  CALCIUM 8.6*   Liver Function Tests: Recent Labs  Lab 05/02/18  1147  AST 13*  ALT 13  ALKPHOS 63  BILITOT 0.6  PROT 7.0  ALBUMIN 3.7   No results for input(s): LIPASE, AMYLASE in the last 168 hours. No results for input(s): AMMONIA in the last 168 hours. CBC: Recent Labs  Lab 05/02/18 1147  WBC 6.2  NEUTROABS 3.8  HGB 8.9*  HCT 27.7*  MCV 96.5  PLT 226   Cardiac Enzymes: No results for input(s): CKTOTAL, CKMB, CKMBINDEX, TROPONINI in the last 168 hours.  BNP (last 3 results) No results for input(s): BNP in the last 8760 hours.  ProBNP (last 3 results) No results for input(s): PROBNP in the last 8760 hours.  CBG: No results for input(s): GLUCAP in the last 168 hours.  Radiological Exams on Admission: No results found.  EKG: I independently viewed the EKG done and my findings are as followed: Sinus rhythm with rate of 60 and nonspecific ST-T changes.  Assessment/Plan Present on Admission: . GI bleed  Active Problems:   GI bleed  Rectal bleeding Presented with rectal bleeding, bright red blood per rectum with onset 3 days ago Hemoglobin dropped from baseline 12 to 8 K. Positive FOBT History of internal hemorrhoids, diverticulosis and adenomatous polyps Last colonoscopy was in October 2019 done by [facility] Obtain type and screen Monitor H&H GI consulted and will see in consultation Closely monitor vital signs  Acute blood loss anemia secondary to rectal bleeding Baseline hemoglobin 12 Transfuse to maintain hemoglobin greater than 7 N.p.o. for possible procedure by GI  Essential hypertension Soft blood pressure on presentation Continue to hold off antihypertensive Closely monitor vital signs Resume blood pressure medication once GI bleed has resolved  Hyperlipidemia Resume home statin  Dizziness suspect orthostatic from blood loss Obtain orthostatic vital signs IV fluid hydration Fall precautions   DVT prophylaxis: SCDs  Code Status: Full code  Family Communication: None at bedside  Disposition  Plan: Admit to telemetry unit  Consults called: GI consulted by ED physician  Admission status: Observation status    Kayleen Memos MD [facility] Pager [phone]  If 7PM-7AM, please contact night-coverage [email or web address] Password [email or web address]  05/02/2018, 1:08 PM

## 2018-05-02 NOTE — ED Provider Notes (Signed)
[facility] DEPT Provider Note   CSN: [id number] Arrival date & time: 05/02/18  1057    History   Chief Complaint No chief complaint on file.   HPI Leslie Livingston is a 68 y.o. male.     HPI Patient reports his stool started to look bloody Sunday evening, 2-1/2 days ago.  He reports that it has look like a lot of blood mixed in his stool.  He went to his doctor's office today and was sent from the office via EMS for GI bleeding.  He reports today he did feel lightheaded and close to passing out while he was at the doctor's office.  He has not had any syncope at home.  There has not been any associated pain.  Patient denies nausea or vomiting.  Ports he takes a daily baby aspirin as prescribed.  He does not take other anticoagulants.  His medication list include NSAIDs but he denies that he is used those in quite a while.  No prior history of GI bleed.  He reports he did have a colonoscopy last year but indicates that it was suboptimal due to problems with accomplishing his prep.  No prior history of GI bleed.  Patient reports he is otherwise been well.  He has not been having fevers, chills, cough, shortness of breath or chest pain.  Reports he takes his blood pressure medicine as prescribed. Past Medical History:  Diagnosis Date  . Allergy   . Arthritis   . Hyperlipidemia   . Hypertension     Patient Active Problem List   Diagnosis Date Noted  . GI bleed 05/02/2018  . ERECTILE DYSFUNCTION 06/06/2007  . ANEMIA-NOS 12/16/2006  . HYPERTENSION 12/16/2006  . HEMORRHOIDS, EXTERNAL 12/16/2006    History reviewed. No pertinent surgical history.      Home Medications    Prior to Admission medications   Medication Sig Start Date End Date Taking? Authorizing Provider  amLODipine (NORVASC) 5 MG tablet Take 5 mg by mouth daily.   Yes [provider]  aspirin EC 81 MG tablet Take 81 mg by mouth daily.   Yes [provider]   lisinopril (PRINIVIL,ZESTRIL) 20 MG tablet Take 20 mg by mouth daily. 02/23/18  Yes [provider]  meloxicam (MOBIC) 15 MG tablet Take 15 mg by mouth daily. 06/11/15  Yes [provider]  naproxen (NAPROSYN) 500 MG tablet Take 1 tablet (500 mg total) by mouth 2 (two) times daily. Patient not taking: Reported on 05/02/2018 08/18/15   Blanchie Dessert, MD  simvastatin (ZOCOR) 10 MG tablet Take 10 mg by mouth daily. 04/27/18   [provider]    Family History Family History  Problem Relation Age of Onset  . Colon cancer Neg Hx   . Stomach cancer Neg Hx   . Esophageal cancer Neg Hx   . Rectal cancer Neg Hx     Social History Social History   Tobacco Use  . Smoking status: Current Some Day Smoker  . Smokeless tobacco: Never Used  Substance Use Topics  . Alcohol use: Yes    Alcohol/week: 6.0 standard drinks    Types: 6 Cans of beer per week  . Drug use: No     Allergies   Patient has no known allergies.   Review of Systems Review of Systems 10 Systems reviewed and are negative for acute change except as noted in the HPI.   Physical Exam Updated Vital Signs BP 94/64   Pulse 60  Temp 97.7 F (36.5 C) (Oral)   Resp 14   Ht 6\' 1"  (1.854 m)   Wt 106.6 kg   SpO2 93%   BMI 31.00 kg/m   Physical Exam Constitutional:      Appearance: Normal appearance.     Comments: Patient is alert and nontoxic.  No respiratory distress.  Following commands without difficulty.  He has been able to sit and stand at bedside to remove garments.  He endorsed feeling somewhat lightheaded but did not become actively syncopal.  HENT:     Head: Normocephalic and atraumatic.     Mouth/Throat:     Mouth: Mucous membranes are moist.     Pharynx: Oropharynx is clear.  Eyes:     Extraocular Movements: Extraocular movements intact.  Cardiovascular:     Rate and Rhythm: Normal rate and regular rhythm.     Pulses: Normal pulses.  Pulmonary:     Effort: Pulmonary effort  is normal.     Breath sounds: Normal breath sounds.  Abdominal:     General: There is no distension.     Palpations: Abdomen is soft.     Tenderness: There is no abdominal tenderness. There is no guarding.  Genitourinary:    Comments: Patient has been incontinent of large amount of cranberry colored melanotic stool.  Rectal exam is for cranberry colored bloody stool.  Prostate enlarge. Musculoskeletal: Normal range of motion.        General: No swelling or tenderness.     Right lower leg: No edema.     Left lower leg: No edema.  Skin:    General: Skin is warm and dry.  Neurological:     General: No focal deficit present.     Mental Status: He is alert and oriented to person, place, and time.     Coordination: Coordination normal.  Psychiatric:        Mood and Affect: Mood normal.      ED Treatments / Results  Labs (all labs ordered are listed, but only abnormal results are displayed) Labs Reviewed  COMPREHENSIVE METABOLIC PANEL - Abnormal; Notable for the following components:      Result Value   Chloride 113 (*)    Glucose, Bld 123 (*)    BUN 27 (*)    Creatinine, Ser 1.35 (*)    Calcium 8.6 (*)    AST 13 (*)    GFR calc non Af Amer 54 (*)    All other components within normal limits  CBC WITH DIFFERENTIAL/PLATELET - Abnormal; Notable for the following components:   RBC 2.87 (*)    Hemoglobin 8.9 (*)    HCT 27.7 (*)    All other components within normal limits  POC OCCULT BLOOD, ED - Abnormal; Notable for the following components:   Fecal Occult Bld POSITIVE (*)    All other components within normal limits  PROTIME-INR  TYPE AND SCREEN  PREPARE RBC (CROSSMATCH)  ABO/RH    EKG EKG Interpretation  Date/Time:  Tuesday May 02 2018 11:25:06 EDT Ventricular Rate:  58 PR Interval:    QRS Duration: 95 QT Interval:  410 QTC Calculation: 403 R Axis:   50 Text Interpretation:  Sinus rhythm Abnormal R-wave progression, early transition Minimal ST elevation,  anterior leads no interval change from previous Confirmed by Charlesetta Shanks [phone]) on 05/02/2018 11:41:45 AM   Radiology No results found.  Procedures Procedures (including critical care time) CRITICAL CARE Performed by: Charlesetta Shanks   Total critical care time:  30 minutes  Critical care time was exclusive of separately billable procedures and treating other patients.  Critical care was necessary to treat or prevent imminent or life-threatening deterioration.  Critical care was time spent personally by me on the following activities: development of treatment plan with patient and/or surrogate as well as nursing, discussions with consultants, evaluation of patient's response to treatment, examination of patient, obtaining history from patient or surrogate, ordering and performing treatments and interventions, ordering and review of laboratory studies, ordering and review of radiographic studies, pulse oximetry and re-evaluation of patient's condition. Medications Ordered in ED Medications  0.9 %  sodium chloride infusion ( Intravenous New Bag/Given 05/02/18 1150)  pantoprazole (PROTONIX) 80 mg in sodium chloride 0.9 % 250 mL (0.32 mg/mL) infusion (8 mg/hr Intravenous New Bag/Given 05/02/18 1235)  0.9 %  sodium chloride infusion (has no administration in time range)  pantoprazole (PROTONIX) 80 mg in sodium chloride 0.9 % 100 mL IVPB (0 mg Intravenous Stopped 05/02/18 1235)     Initial Impression / Assessment and Plan / ED Course  I have reviewed the triage vital signs and the nursing notes.  Pertinent labs & imaging results that were available during my care of the patient were reviewed by me and considered in my medical decision making (see chart for details).  Clinical Course as of May 02 1314  Tue May 02, 2018  1119 GI consult ordered.   [MP]  6045 Consult: Reviewed with Nevin Bloodgood of the [facility].  Will see patient ASAP in ED.   [MP]    Clinical Course User Index [MP] Charlesetta Shanks, MD       Consult: [facility] hospitalist Dr. Nevada Crane for admission.  Patient presents with new onset GI bleeding.  Patient has gross melena that is cranberry colored and new anemia.  His blood pressures are soft with near syncope at outpatient office.  Patient's mental status is clear.  Respiratory status is stable.  2 units of packed red blood cells ordered for transfusion.  Started on Protonix bolus and drip.  admitted for ongoing management with consultation placed to gastroenterology.  Final Clinical Impressions(s) / ED Diagnoses   Final diagnoses:  Gastrointestinal hemorrhage with melena  Symptomatic anemia    ED Discharge Orders    None       Charlesetta Shanks, MD 05/02/18 1317

## 2018-05-02 NOTE — Care Management Obs Status (Signed)
[place] NOTIFICATION   Patient Details  Name: Leslie Livingston MRN: [id number] Date of Birth: 19-Jan-1951   Medicare Observation Status Notification Given:  Yes    MahabirJuliann Pulse, RN 05/02/2018, 3:19 PM

## 2018-05-02 NOTE — Consult Note (Signed)
Referring Provider: [facility]  Primary Care Physician:  Nolene Ebbs, MD Primary Gastroenterologist:  Lucio Edward, MD     Reason for Consultation:   GI bleed    ASSESSMENT / PLAN:    1. 70 male admitted with painless hematochezia and ABL anemia ( Hgb 13..2 >>> 8.9). He describes blood as being bright red. Denies black stools. This may be a diverticular hemorrhage. UGIB not excluded as he was apparently incontinent of "cranberry , melanotic" stool in ED. BUN not elevated out of proportion to creatinine. Additionally would think there would be more hemodynamic instability if this was upper bleed . However, he does take a daily ASA and occasional Mobic / Naprosyn. No bleeding since admission today -Patient needs colonoscopy, his bowel prep on last colonoscopy  October 2019 was inadequate. He prefers NOT to proceed with inpatient colonoscopy but has agreed to be monitored overnight on clear liquids.  If he bleeds again this evening would get CT angio.  -Reasonable to continue PPI gtt for now -Depending on where things stand in am we can make decision about prepping for colonoscopy. He needs a two day prep anyway -He has received a one uPRBC this afternoon.  -am CBC already ordered   2. AKI, should improve with IVF. Cr 1.35, baseline is 1.1   HPI:      HPI: Leslie Livingston is a 68 y.o. male with HTN, hyperlipidemia, arthritis, hx of adenomatous colon polyps. He presented to ED today with complaints of painless rectal bleeding. Bleeding started on Sunday with a BM. He had another bloody BM yesterday and another today after which time he saw PCP and was directed to ED. Stools are brown, no black stools. Blood has been bright red in color and large amounts. . He had associated dizziness and and some transient nausea earlier today. Patient takes a daily baby ASA. Mobic and Naprosyn on home med list but patient says he seldom takes those (maybe one time a month).     ED course:    Hemodynamically stable.  WBC 6.2, hgb 8.9, plt 226, MCV 96 BUN 27, Cr 1.35 INR 1.0 FOBT +    Recent Endoscopic history: Oct 2019 colonoscopy for polyp surveillance - exam complete but prep inadequate wit solid stool throughout colon. . Internal hemorrhoids, left colon -multiple diverticula with narrowing at a diverticulum opening. Right colon also showed diverticula.     Past Medical History:  Diagnosis Date  . Allergy   . Arthritis   . Hyperlipidemia   . Hypertension     History reviewed. No pertinent surgical history.  Prior to Admission medications   Medication Sig Start Date End Date Taking? Authorizing Provider  amLODipine (NORVASC) 5 MG tablet Take 5 mg by mouth daily.   Yes [provider]  aspirin EC 81 MG tablet Take 81 mg by mouth daily.   Yes [provider]  lisinopril (PRINIVIL,ZESTRIL) 20 MG tablet Take 20 mg by mouth daily. 02/23/18  Yes [provider]  meloxicam (MOBIC) 15 MG tablet Take 15 mg by mouth daily. 06/11/15  Yes [provider]  naproxen (NAPROSYN) 500 MG tablet Take 1 tablet (500 mg total) by mouth 2 (two) times daily. Patient not taking: Reported on 05/02/2018 08/18/15   Blanchie Dessert, MD  simvastatin (ZOCOR) 10 MG tablet Take 10 mg by mouth daily. 04/27/18   [provider]    Current Facility-Administered Medications  Medication Dose Route Frequency Provider Last Rate Last Dose  . 0.9 %  sodium chloride infusion   Intravenous Continuous Charlesetta Shanks, MD 125 mL/hr at 05/02/18 1150    . pantoprazole (PROTONIX) 80 mg in sodium chloride 0.9 % 250 mL (0.32 mg/mL) infusion  8 mg/hr Intravenous Continuous Charlesetta Shanks, MD 25 mL/hr at 05/02/18 1235 8 mg/hr at 05/02/18 1235  . simvastatin (ZOCOR) tablet 10 mg  10 mg Oral Daily Irene Pap N, DO        Allergies as of 05/02/2018  . (No Known Allergies)    Family History  Problem Relation Age of Onset  . Colon cancer Neg Hx   . Stomach cancer Neg Hx    . Esophageal cancer Neg Hx   . Rectal cancer Neg Hx     Social History   Socioeconomic History  . Marital status: Single    Spouse name: Not on file  . Number of children: Not on file  . Years of education: Not on file  . Highest education level: Not on file  Occupational History  . Not on file  Social Needs  . Financial resource strain: Not on file  . Food insecurity:    Worry: Not on file    Inability: Not on file  . Transportation needs:    Medical: Not on file    Non-medical: Not on file  Tobacco Use  . Smoking status: Current Some Day Smoker  . Smokeless tobacco: Never Used  Substance and Sexual Activity  . Alcohol use: Yes    Alcohol/week: 6.0 standard drinks    Types: 6 Cans of beer per week  . Drug use: No  . Sexual activity: Not on file  Lifestyle  . Physical activity:    Days per week: Not on file    Minutes per session: Not on file  . Stress: Not on file  Relationships  . Social connections:    Talks on phone: Not on file    Gets together: Not on file    Attends religious service: Not on file    Active member of club or organization: Not on file    Attends meetings of clubs or organizations: Not on file    Relationship status: Not on file  . Intimate partner violence:    Fear of current or ex partner: Not on file    Emotionally abused: Not on file    Physically abused: Not on file    Forced sexual activity: Not on file  Other Topics Concern  . Not on file  Social History Narrative  . Not on file    Review of Systems: All systems reviewed and negative except where noted in HPI.  Physical Exam: Vital signs in last 24 hours: Temp:  [97.7 F (36.5 C)-98.9 F (37.2 C)] 98.7 F (37.1 C) (04/07 1450) Pulse Rate:  [56-63] 63 (04/07 1450) Resp:  [14-23] 18 (04/07 1450) BP: (94-132)/(64-80) 124/75 (04/07 1450) SpO2:  [93 %-100 %] 99 % (04/07 1450) Weight:  [105.7 kg-106.6 kg] 105.7 kg (04/07 1354)   General:   Alert, well-developed, ma in NAD  Psych:  Pleasant, cooperative. Normal mood and affect. Eyes:  Pupils equal, sclera clear, no icterus.   Conjunctiva pink. Ears:  Normal auditory acuity. Nose:  No deformity, discharge,  or lesions. Neck:  Supple; no masses Lungs:  Clear throughout to auscultation.   No wheezes, crackles, or rhonchi.  Heart:  Regular rate and rhythm; no murmurs, no lower extremity edema Abdomen:  Soft, non-distended, nontender, BS active, no palp mass    Rectal:  Completed in ED with findings of cranberry colored bloody stool. In ED he was incontinent of a "large amount of cranberry colored melanotic stool".   Msk:  Symmetrical without gross deformities. . Neurologic:  Alert and  oriented x4;  grossly normal neurologically. Skin:  Intact without significant lesions or rashes.   Intake/Output from previous day: No intake/output data recorded. Intake/Output this shift: Total I/O In: 100 [IV Piggyback:100] Out: -   Lab Results: Recent Labs    05/02/18 1147  WBC 6.2  HGB 8.9*  HCT 27.7*  PLT 226   BMET Recent Labs    05/02/18 1147  NA 140  K 3.9  CL 113*  CO2 22  GLUCOSE 123*  BUN 27*  CREATININE 1.35*  CALCIUM 8.6*   LFT Recent Labs    05/02/18 1147  PROT 7.0  ALBUMIN 3.7  AST 13*  ALT 13  ALKPHOS 63  BILITOT 0.6   PT/INR Recent Labs    05/02/18 1147  LABPROT 13.2  INR 1.0   Hepatitis Panel No results for input(s): HEPBSAG, HCVAB, HEPAIGM, HEPBIGM in the last 72 hours.    Studies/Results: No results found.   Tye Savoy, NP-C @  05/02/2018, 2:55 PM

## 2018-05-02 NOTE — ED Notes (Signed)
ED TO INPATIENT HANDOFF REPORT  ED Nurse Name and Phone #:  Maddie RN  S Name/Age/Gender Leslie Livingston 68 y.o. male Room/Bed: WA10/WA10  Code Status   Code Status: Full Code {Patient oriented to: A&Ox4 Home/SNF/Other Home  Is this baseline? Yes   Triage Complete: Triage complete  Chief Complaint Rectal Bleed  Triage Note PER EMS: Pt from pcp, pt has copious rectal bleeding.  Bleeding started Sunday, and has increased since then.  Pt has no other c/o.   Allergies No Known Allergies  Level of Care/Admitting Diagnosis ED Disposition    ED Disposition Condition Comment   Admit  Hospital Area: [facility] [[id number]]  Level of Care: Telemetry [5]  Admit to tele based on following criteria: Monitor for Ischemic changes  Diagnosis: GI bleed [379024]  Admitting Physician: Kayleen Memos [[id number]]  Attending Physician: Kayleen Memos [[id number]]  PT Class (Do Not Modify): Observation [104]  PT Acc Code (Do Not Modify): Observation [10022]       B Medical/Surgery History Past Medical History:  Diagnosis Date  . Allergy   . Arthritis   . Hyperlipidemia   . Hypertension    History reviewed. No pertinent surgical history.   A IV Location/Drains/Wounds Patient Lines/Drains/Airways Status   Active Line/Drains/Airways    Name:   Placement date:   Placement time:   Site:   Days:   Peripheral IV 05/02/18 Left Forearm   05/02/18    -    Forearm   less than 1   Peripheral IV 05/02/18 Right Forearm   05/02/18    1148    Forearm   less than 1          Intake/Output Last 24 hours  Intake/Output Summary (Last 24 hours) at 05/02/2018 1332 Last data filed at 05/02/2018 1235 Gross per 24 hour  Intake 100 ml  Output -  Net 100 ml    Labs/Imaging Results for orders placed or performed during the hospital encounter of 05/02/18 (from the past 48 hour(s))  ABO/Rh     Status: None (Preliminary result)   Collection Time: 05/02/18 11:35 AM  Result Value  Ref Range   ABO/RH(D)      O POS Performed at [facility], [place] [address] [place], [place] [address]   Comprehensive metabolic panel     Status: Abnormal   Collection Time: 05/02/18 11:47 AM  Result Value Ref Range   Sodium 140 135 - 145 mmol/L   Potassium 3.9 3.5 - 5.1 mmol/L   Chloride 113 (H) 98 - 111 mmol/L   CO2 22 22 - 32 mmol/L   Glucose, Bld 123 (H) 70 - 99 mg/dL   BUN 27 (H) 8 - 23 mg/dL   Creatinine, Ser 1.35 (H) 0.61 - 1.24 mg/dL   Calcium 8.6 (L) 8.9 - 10.3 mg/dL   Total Protein 7.0 6.5 - 8.1 g/dL   Albumin 3.7 3.5 - 5.0 g/dL   AST 13 (L) 15 - 41 U/L   ALT 13 0 - 44 U/L   Alkaline Phosphatase 63 38 - 126 U/L   Total Bilirubin 0.6 0.3 - 1.2 mg/dL   GFR calc non Af Amer 54 (L) >60 mL/min   GFR calc Af Amer >60 >60 mL/min   Anion gap 5 5 - 15    Comment: Performed at [facility], [place] [address], [place], [place] [address]  CBC WITH DIFFERENTIAL     Status: Abnormal   Collection Time:  05/02/18 11:47 AM  Result Value Ref Range   WBC 6.2 4.0 - 10.5 K/uL   RBC 2.87 (L) 4.22 - 5.81 MIL/uL   Hemoglobin 8.9 (L) 13.0 - 17.0 g/dL   HCT 27.7 (L) 39.0 - 52.0 %   MCV 96.5 80.0 - 100.0 fL   MCH 31.0 26.0 - 34.0 pg   MCHC 32.1 30.0 - 36.0 g/dL   RDW 12.5 11.5 - 15.5 %   Platelets 226 150 - 400 K/uL   nRBC 0.0 0.0 - 0.2 %   Neutrophils Relative % 60 %   Neutro Abs 3.8 1.7 - 7.7 K/uL   Lymphocytes Relative 29 %   Lymphs Abs 1.8 0.7 - 4.0 K/uL   Monocytes Relative 7 %   Monocytes Absolute 0.4 0.1 - 1.0 K/uL   Eosinophils Relative 2 %   Eosinophils Absolute 0.1 0.0 - 0.5 K/uL   Basophils Relative 1 %   Basophils Absolute 0.0 0.0 - 0.1 K/uL   Immature Granulocytes 1 %   Abs Immature Granulocytes 0.03 0.00 - 0.07 K/uL    Comment: Performed at [facility], [place] [address], [place], [place] [address]  Protime-INR     Status: None   Collection Time: 05/02/18 11:47 AM  Result Value Ref Range   Prothrombin Time 13.2  11.4 - 15.2 seconds   INR 1.0 0.8 - 1.2    Comment: (NOTE) INR goal varies based on device and disease states. Performed at [facility], [place] [address], [place], [place] [address]   Type and screen [place]     Status: None (Preliminary result)   Collection Time: 05/02/18 11:47 AM  Result Value Ref Range   ABO/RH(D) O POS    Antibody Screen NEG    Sample Expiration      05/05/2018 Performed at [facility] [place], [address] [place], [place] [address]    Unit Number [id number]    Blood Component Type RED CELLS,LR    Unit division 00    Status of Unit ALLOCATED    Transfusion Status OK TO TRANSFUSE    Crossmatch Result Compatible    Unit Number [id number]    Blood Component Type RED CELLS,LR    Unit division 00    Status of Unit ALLOCATED    Transfusion Status OK TO TRANSFUSE    Crossmatch Result Compatible   POC occult blood, ED     Status: Abnormal   Collection Time: 05/02/18 11:53 AM  Result Value Ref Range   Fecal Occult Bld POSITIVE (A) NEGATIVE  Prepare RBC     Status: None   Collection Time: 05/02/18  1:00 PM  Result Value Ref Range   Order Confirmation      ORDER PROCESSED BY BLOOD BANK Performed at [facility], [place] [address] [place], [place] [address]    No results found.  Pending Labs Unresulted Labs (From admission, onward)    Start     Ordered   05/03/18 0500  CBC  Tomorrow morning,   R     05/02/18 1323   05/03/18 3710  Basic metabolic panel  Tomorrow morning,   R     05/02/18 1323          Vitals/Pain Today's Vitals   05/02/18 1117 05/02/18 1118 05/02/18 1149 05/02/18 1300  BP: 120/73  94/64 132/72  Pulse:  (!) 56 60 (!) 58  Resp: 16  14 (!) 23  Temp: 97.7 F (36.5  C)     TempSrc: Oral     SpO2: 99%  93% 96%  Weight:      Height:        Isolation Precautions No active isolations  Medications Medications  0.9 %  sodium chloride infusion (  Intravenous New Bag/Given 05/02/18 1150)  pantoprazole (PROTONIX) 80 mg in sodium chloride 0.9 % 250 mL (0.32 mg/mL) infusion (8 mg/hr Intravenous New Bag/Given 05/02/18 1235)  0.9 %  sodium chloride infusion (has no administration in time range)  simvastatin (ZOCOR) tablet 10 mg (has no administration in time range)  pantoprazole (PROTONIX) 80 mg in sodium chloride 0.9 % 100 mL IVPB (0 mg Intravenous Stopped 05/02/18 1235)    Mobility walks Low fall risk   Focused Assessments GI   R Recommendations: See Admitting Provider Note  Report given to:   Additional Notes:  N/A

## 2018-05-02 NOTE — ED Notes (Signed)
Bed: KP22 Expected date:  Expected time:  Means of arrival:  Comments: EMS 68yo rectal bleed

## 2018-05-03 ENCOUNTER — Other Ambulatory Visit: Payer: Self-pay | Admitting: Nurse Practitioner

## 2018-05-03 ENCOUNTER — Telehealth: Payer: Self-pay

## 2018-05-03 DIAGNOSIS — K921 Melena: Secondary | ICD-10-CM | POA: Diagnosis not present

## 2018-05-03 DIAGNOSIS — K5791 Diverticulosis of intestine, part unspecified, without perforation or abscess with bleeding: Secondary | ICD-10-CM | POA: Diagnosis not present

## 2018-05-03 DIAGNOSIS — K922 Gastrointestinal hemorrhage, unspecified: Secondary | ICD-10-CM

## 2018-05-03 LAB — TYPE AND SCREEN
ABO/RH(D): O POS
Antibody Screen: NEGATIVE
Unit division: 0
Unit division: 0

## 2018-05-03 LAB — BPAM RBC
Blood Product Expiration Date: 202004222359
Blood Product Expiration Date: 202004222359
ISSUE DATE / TIME: 202004071426
ISSUE DATE / TIME: 202004071744
Unit Type and Rh: 5100
Unit Type and Rh: 5100

## 2018-05-03 LAB — CBC
HCT: 29.6 % — ABNORMAL LOW (ref 39.0–52.0)
Hemoglobin: 9.6 g/dL — ABNORMAL LOW (ref 13.0–17.0)
MCH: 30.5 pg (ref 26.0–34.0)
MCHC: 32.4 g/dL (ref 30.0–36.0)
MCV: 94 fL (ref 80.0–100.0)
Platelets: 196 10*3/uL (ref 150–400)
RBC: 3.15 MIL/uL — ABNORMAL LOW (ref 4.22–5.81)
RDW: 14.5 % (ref 11.5–15.5)
WBC: 6.4 10*3/uL (ref 4.0–10.5)
nRBC: 0 % (ref 0.0–0.2)

## 2018-05-03 LAB — BASIC METABOLIC PANEL
Anion gap: 5 (ref 5–15)
BUN: 14 mg/dL (ref 8–23)
CO2: 22 mmol/L (ref 22–32)
Calcium: 8.5 mg/dL — ABNORMAL LOW (ref 8.9–10.3)
Chloride: 113 mmol/L — ABNORMAL HIGH (ref 98–111)
Creatinine, Ser: 1.19 mg/dL (ref 0.61–1.24)
GFR calc Af Amer: >60 mL/min (ref >60)
GFR calc non Af Amer: >60 mL/min (ref >60)
Glucose, Bld: 100 mg/dL — ABNORMAL HIGH (ref 70–99)
Potassium: 3.6 mmol/L (ref 3.5–5.1)
Sodium: 140 mmol/L (ref 135–145)

## 2018-05-03 MED ORDER — ASPIRIN EC 81 MG PO TBEC: 81.0000 mg | DELAYED_RELEASE_TABLET | Freq: Every day | ORAL | Status: DC

## 2018-05-03 MED ORDER — PANTOPRAZOLE SODIUM 40 MG PO TBEC: 40.0000 mg | DELAYED_RELEASE_TABLET | Freq: Two times a day (BID) | ORAL | 3 refills | Status: DC

## 2018-05-03 MED ORDER — FERROUS SULFATE 325 (65 FE) MG PO TABS: 325.0000 mg | ORAL_TABLET | Freq: Two times a day (BID) | ORAL | 11 refills | Status: DC

## 2018-05-03 NOTE — Evaluation (Signed)
Physical Therapy One Time Evaluation Patient Details Name: Leslie Livingston MRN: [id number] DOB: 06-30-1950 Today's Date: 05/03/2018   History of Present Illness  68 y.o. male with medical history significant for hypertension, hyperlipidemia, diverticulosis, adenomatous polyps, internal hemorrhoids and admitted for rectal bleeding  Clinical Impression  Patient evaluated by Physical Therapy with no further acute PT needs identified. All education has been completed and the patient has no further questions.  See below for any follow-up Physical Therapy or equipment needs. PT is signing off. Thank you for this referral.      Follow Up Recommendations No PT follow up    Equipment Recommendations  None recommended by PT    Recommendations for Other Services       Precautions / Restrictions Precautions Precautions: None Restrictions Weight Bearing Restrictions: No      Mobility  Bed Mobility               General bed mobility comments: pt up in recliner  Transfers Overall transfer level: Needs assistance Equipment used: None Transfers: Sit to/from Stand Sit to Stand: Supervision;Modified independent (Device/Increase time)            Ambulation/Gait Ambulation/Gait assistance: Supervision;Modified independent (Device/Increase time) Gait Distance (Feet): 300 Feet Assistive device: None Gait Pattern/deviations: WFL(Within Functional Limits)     General Gait Details: no unsteadiness or symptoms reported  Stairs            Wheelchair Mobility    Modified Rankin (Stroke Patients Only)       Balance Overall balance assessment: No apparent balance deficits (not formally assessed)(denies falls)                                           Pertinent Vitals/Pain Pain Assessment: No/denies pain    Home Living Family/patient expects to be discharged to:: Private residence Living Arrangements: Alone Available Help at Discharge: Family Type  of Home: House       Home Layout: One level Home Equipment: None      Prior Function Level of Independence: Independent               Hand Dominance        Extremity/Trunk Assessment        Lower Extremity Assessment Lower Extremity Assessment: Overall WFL for tasks assessed    Cervical / Trunk Assessment Cervical / Trunk Assessment: Normal  Communication   Communication: No difficulties  Cognition Arousal/Alertness: Awake/alert Behavior During Therapy: WFL for tasks assessed/performed Overall Cognitive Status: Within Functional Limits for tasks assessed                                        General Comments      Exercises     Assessment/Plan    PT Assessment Patent does not need any further PT services  PT Problem List         PT Treatment Interventions      PT Goals (Current goals can be found in the Care Plan section)  Acute Rehab PT Goals PT Goal Formulation: All assessment and education complete, DC therapy    Frequency     Barriers to discharge        Co-evaluation  AM-PAC PT "6 Clicks" Mobility  Outcome Measure Help needed turning from your back to your side while in a flat bed without using bedrails?: None Help needed moving from lying on your back to sitting on the side of a flat bed without using bedrails?: None Help needed moving to and from a bed to a chair (including a wheelchair)?: None Help needed standing up from a chair using your arms (e.g., wheelchair or bedside chair)?: None Help needed to walk in hospital room?: None Help needed climbing 3-5 steps with a railing? : None 6 Click Score: 24    End of Session   Activity Tolerance: Patient tolerated treatment well Patient left: in chair;with call bell/phone within reach Nurse Communication: Mobility status PT Visit Diagnosis: Difficulty in walking, not elsewhere classified (R26.2)    Time: 4782-9562 PT Time Calculation (min)  (ACUTE ONLY): 11 min   Charges:   PT Evaluation $PT Eval Low Complexity: [place], PT, DPT Acute Rehabilitation Services Office: [phone] Pager: [phone]   Christion Leonhard,KATHrine E 05/03/2018, 11:59 AM

## 2018-05-03 NOTE — TOC Transition Note (Signed)
[facility]) - CM/SW Discharge Note   Patient Details  Name: DARRIK RICHMAN MRN: [id number] Date of Birth: Oct 24, 1950  [facility]) CM/SW Contact:  Dessa Phi, RN Phone Number: 05/03/2018, 10:42 AM   Clinical Narrative: d/c home w/HHC.chose [facility]-rep [facility] able to accept- HHRN-labs draws,disease mgmnt,med instruction. No further CM needs.      Final next level of care: Home w Home Health Services Barriers to Discharge: No Barriers Identified   Patient Goals and CMS Choice   CMS Medicare.gov Compare Post Acute Care list provided to:: Patient Choice offered to / list presented to : Patient  Discharge Placement                       Discharge Plan and Services   Discharge Planning Services: CM Consult                HH Arranged: RN [facility] Agency: [place] [facility]   Social Determinants of Health (SDOH) Interventions     Readmission Risk Interventions No flowsheet data found.

## 2018-05-03 NOTE — Discharge Summary (Signed)
Physician Discharge Summary  Leslie Livingston VZC:[id number] DOB: 22-Jun-1950 DOA: 05/02/2018  PCP: Nolene Ebbs, MD  Admit date: 05/02/2018 Discharge date: 05/03/2018  Time spent: 45 minutes  Recommendations for Outpatient Follow-up:  1. Patient has new medications of Protonix and iron-resume aspirin 5 days 2. Will need lab draw outpatient by RN and medication management Home health ordered 3. GI will coordinate outpatient follow-up for scope 4. Does require Tylenol only for pain meds do not give over-the-counter meds  Discharge Diagnoses:  Active Problems:   GI bleed   Discharge Condition: Soft  Diet recommendation: Soft  Filed Weights   05/02/18 1116 05/02/18 1354  Weight: 106.6 kg 105.7 kg    History of present illness:  68 year old African-American male known HTN HLD diverticulosis prior polyps internal hemorrhoids came in with rectal bleeding-?  Bright red blood,?  Dark-note that patient has been taking aspirin Mobic Naprosyn in the outpatient setting  Blood pressure was soft in the ED no drop in hemoglobin from 12-8+ FOBT  Hospital Course:  Rectal bleeding-last colonoscopy 10/2017 showed adenomatous polyps He was seen in consult and felt to be hemodynamically stable and it was felt that he could have an outpatient follow-up of the same I have encouraged him to discontinue his aspirin Naprosyn and other medications that may contribute to the same He will resume aspirin in the outpatient setting in 5 days and I placed him on Protonix twice daily  Hypertension I resumed his meds Hyperlipidemia I resumed his meds Dizziness from orthostasis He ambulated in the hallway with physical therapy did not have any symptoms and was stable for discharge   Procedures:  None   Consultations:  GI  Discharge Exam: Vitals:   05/02/18 2126 05/03/18 0505  BP: 136/62 131/74  Pulse: 62 (!) 57  Resp: 18 16  Temp: 98.9 F (37.2 C) 98.7 F (37.1 C)  SpO2: 98% 95%    General:  Awake alert pleasant no distress sitting up in bed EOMI NCAT no icterus no pallor Cardiovascular: S1-S2 no murmur rub or gallop Abdomen soft no rebound no guarding Respiratory: Chest clinically clear no added sound Neurologically intact  Discharge Instructions   Discharge Instructions    Diet - low sodium heart healthy   Complete by:  As directed    Diet - low sodium heart healthy   Complete by:  As directed    Discharge instructions   Complete by:  As directed    No OTC nsaids-only tylenol Get labs in about 1-2 weeks No Aspirin for 5 days You should follow with GI as OP and get scoped   Discharge instructions   Complete by:  As directed    Take iron 2 x a day Get labs in 1 week a nurse will come out to draw   Increase activity slowly   Complete by:  As directed    Increase activity slowly   Complete by:  As directed      Allergies as of 05/03/2018   No Known Allergies     Medication List    STOP taking these medications   meloxicam 15 MG tablet Commonly known as:  MOBIC   naproxen 500 MG tablet Commonly known as:  NAPROSYN     TAKE these medications   amLODipine 5 MG tablet Commonly known as:  NORVASC Take 5 mg by mouth daily.   aspirin EC 81 MG tablet Take 1 tablet (81 mg total) by mouth daily. Start taking on:  May 08, 2018 What  changed:  These instructions start on May 08, 2018. If you are unsure what to do until then, ask your doctor or other care provider.   ferrous sulfate 325 (65 FE) MG tablet Take 1 tablet (325 mg total) by mouth 2 (two) times daily with a meal.   lisinopril 20 MG tablet Commonly known as:  PRINIVIL,ZESTRIL Take 20 mg by mouth daily.   pantoprazole 40 MG tablet Commonly known as:  Protonix Take 1 tablet (40 mg total) by mouth 2 (two) times daily for 30 days.   simvastatin 10 MG tablet Commonly known as:  ZOCOR Take 10 mg by mouth daily.      No Known Allergies    The results of significant diagnostics from this  hospitalization (including imaging, microbiology, ancillary and laboratory) are listed below for reference.    Significant Diagnostic Studies: No results found.  Microbiology: No results found for this or any previous visit (from the past 240 hour(s)).   Labs: Basic Metabolic Panel: Recent Labs  Lab 05/02/18 1147 05/03/18 0515  NA 140 140  K 3.9 3.6  CL 113* 113*  CO2 22 22  GLUCOSE 123* 100*  BUN 27* 14  CREATININE 1.35* 1.19  CALCIUM 8.6* 8.5*   Liver Function Tests: Recent Labs  Lab 05/02/18 1147  AST 13*  ALT 13  ALKPHOS 63  BILITOT 0.6  PROT 7.0  ALBUMIN 3.7   No results for input(s): LIPASE, AMYLASE in the last 168 hours. No results for input(s): AMMONIA in the last 168 hours. CBC: Recent Labs  Lab 05/02/18 1147 05/03/18 0515  WBC 6.2 6.4  NEUTROABS 3.8  --   HGB 8.9* 9.6*  HCT 27.7* 29.6*  MCV 96.5 94.0  PLT 226 196   Cardiac Enzymes: No results for input(s): CKTOTAL, CKMB, CKMBINDEX, TROPONINI in the last 168 hours. BNP: BNP (last 3 results) No results for input(s): BNP in the last 8760 hours.  ProBNP (last 3 results) No results for input(s): PROBNP in the last 8760 hours.  CBG: No results for input(s): GLUCAP in the last 168 hours.     Signed:  Nita Sells MD   [facility] 05/03/2018, 10:28 AM

## 2018-05-03 NOTE — Telephone Encounter (Signed)
-----   Message from Ladene Artist, MD sent at 05/03/2018 12:55 PM EDT ----- Regarding: RE: Leslie Livingston,   He must complete a full 2 day bowel prep as his colonoscopy in 10/2017 had an inadequate prep.  Schedule colonoscopy/EGD in April as it is urgent. I am in the [facility] on 4/14. If that day doesn't work for him then another day in April on my schedule. You'll have to check the modified schedule to know which days I'm in [place] after 4/14.  Thanks,  MS ----- Message ----- From: Willia Craze, NP Sent: 05/03/2018  10:23 AM EDT To: Ladene Artist, MD, Real Cons Myer Bohlman, LPN  Good morning,  This patient needs follow up CBC in two weeks, I placed the order in computer. He also needs EGD and colonoscopy with Dr. Fuller Plan (didn't want it inpatient) for evaluation of GI bleed / anemia. Oral iron should be held a week prior to procedure. Beth, I am forwarding to Dr. Fuller Plan so he can decide on timing of procedure based on [facility] scheduling. Pretty healthy patient could probably go straight for procedures without hospital follow visit but will defer to Dr. Fuller Plan.  Thanks,  PG

## 2018-05-03 NOTE — Progress Notes (Signed)
     [facility] Progress Note   Chief Complaint:  GI bleed   SUBJECTIVE:    no complaints. No BM or bleeding since admission.    ASSESSMENT AND PLAN:   50 male admitted with painless hematochezia and ABL anemia ( Hgb 13..2 >>> 8.9).  This is probably a diverticular hemorrhage. UGIB not definitely excluded given some NSAID use and described "cranberry , melanotic" stool in ED but would think there would be associated hemodynamic instability / more elevation in BUN. No further bleeding.  -Post two units of blood. Hgb stable now at 9.6.   -Patient needs colonoscopy. Yesterday and today he declines inpatient colonoscopy, happy to have done outpatient.   -Patient will likely be discharged today. Hospitalist has already stopped NSAIDS with exception on baby ASA which will be held for a few days.  -Recommend oral iron upon discharge.  -Needs CBC in our office in a couple of week. I will place order.  -Daily PPI at discharge -I will have our office arranged for follow up. He will likely need EGD and colonoscopy    2. AKI, resolved.    OBJECTIVE:     Vital signs in last 24 hours: Temp:  [97.7 F (36.5 C)-98.9 F (37.2 C)] 98.7 F (37.1 C) (04/08 0505) Pulse Rate:  [56-64] 57 (04/08 0505) Resp:  [14-23] 16 (04/08 0505) BP: (94-136)/(62-80) 131/74 (04/08 0505) SpO2:  [93 %-100 %] 95 % (04/08 0505) Weight:  [105.7 kg-106.6 kg] 105.7 kg (04/07 1354) Last BM Date: 05/02/18 General:   Alert, well-developed male in NAD EENT:  Normal hearing, non icteric sclera, conjunctive pink.  Heart:  Regular rate and rhythm; no murmur.  No lower extremity edema   Pulm: Normal respiratory effort, lungs CTA bilaterally without wheezes or crackles. Abdomen:  Soft, nondistended, nontender.  Normal bowel sounds, no masses felt.       Neurologic:  Alert and  oriented x4;  grossly normal neurologically. Psych:  Pleasant, cooperative.  Normal mood and affect.   Intake/Output from  previous day: 04/07 0701 - 04/08 0700 In: 1361 [I.V.:534; Blood:727; IV Piggyback:100] Out: 7628 [Urine:1650] Intake/Output this shift: No intake/output data recorded.  Lab Results: Recent Labs    05/02/18 1147 05/03/18 0515  WBC 6.2 6.4  HGB 8.9* 9.6*  HCT 27.7* 29.6*  PLT 226 196   BMET Recent Labs    05/02/18 1147 05/03/18 0515  NA 140 140  K 3.9 3.6  CL 113* 113*  CO2 22 22  GLUCOSE 123* 100*  BUN 27* 14  CREATININE 1.35* 1.19  CALCIUM 8.6* 8.5*   LFT Recent Labs    05/02/18 1147  PROT 7.0  ALBUMIN 3.7  AST 13*  ALT 13  ALKPHOS 63  BILITOT 0.6   PT/INR Recent Labs    05/02/18 1147  LABPROT 13.2  INR 1.0     Active Problems:   GI bleed     LOS: 0 days   Leslie Livingston ,NP 05/03/2018, 10:08 AM

## 2018-05-03 NOTE — Telephone Encounter (Signed)
I spoke with the patient. He understands and agrees to the proposed date of 05/09/18. No anticoagulation medications. No oxygen.  Advised not to take any iron supplements. He took 1 today. He understands he will get a phone call about his prep.

## 2018-05-04 ENCOUNTER — Encounter (HOSPITAL_COMMUNITY): Payer: Self-pay

## 2018-05-04 ENCOUNTER — Observation Stay (HOSPITAL_COMMUNITY)
Admission: EM | Admit: 2018-05-04 | Discharge: 2018-05-06 | Disposition: A | Discharge status: Home / Self Care | Payer: Medicare HMO | Attending: Internal Medicine | Admitting: Internal Medicine

## 2018-05-04 ENCOUNTER — Other Ambulatory Visit: Payer: Self-pay

## 2018-05-04 DIAGNOSIS — D500 Iron deficiency anemia secondary to blood loss (chronic): Secondary | ICD-10-CM

## 2018-05-04 DIAGNOSIS — K5731 Diverticulosis of large intestine without perforation or abscess with bleeding: Secondary | ICD-10-CM

## 2018-05-04 DIAGNOSIS — N183 Chronic kidney disease, stage 3 (moderate): Secondary | ICD-10-CM | POA: Diagnosis not present

## 2018-05-04 DIAGNOSIS — D62 Acute posthemorrhagic anemia: Secondary | ICD-10-CM | POA: Insufficient documentation

## 2018-05-04 DIAGNOSIS — Z7982 Long term (current) use of aspirin: Secondary | ICD-10-CM | POA: Insufficient documentation

## 2018-05-04 DIAGNOSIS — Z79899 Other long term (current) drug therapy: Secondary | ICD-10-CM | POA: Diagnosis not present

## 2018-05-04 DIAGNOSIS — F17200 Nicotine dependence, unspecified, uncomplicated: Secondary | ICD-10-CM | POA: Insufficient documentation

## 2018-05-04 DIAGNOSIS — I129 Hypertensive chronic kidney disease with stage 1 through stage 4 chronic kidney disease, or unspecified chronic kidney disease: Secondary | ICD-10-CM | POA: Diagnosis not present

## 2018-05-04 DIAGNOSIS — N179 Acute kidney failure, unspecified: Secondary | ICD-10-CM | POA: Insufficient documentation

## 2018-05-04 DIAGNOSIS — Z6831 Body mass index (BMI) 31.0-31.9, adult: Secondary | ICD-10-CM | POA: Diagnosis not present

## 2018-05-04 DIAGNOSIS — K219 Gastro-esophageal reflux disease without esophagitis: Secondary | ICD-10-CM | POA: Diagnosis not present

## 2018-05-04 DIAGNOSIS — K625 Hemorrhage of anus and rectum: Secondary | ICD-10-CM | POA: Diagnosis not present

## 2018-05-04 DIAGNOSIS — K5730 Diverticulosis of large intestine without perforation or abscess without bleeding: Secondary | ICD-10-CM | POA: Diagnosis not present

## 2018-05-04 DIAGNOSIS — M1990 Unspecified osteoarthritis, unspecified site: Secondary | ICD-10-CM | POA: Insufficient documentation

## 2018-05-04 DIAGNOSIS — I10 Essential (primary) hypertension: Secondary | ICD-10-CM | POA: Diagnosis present

## 2018-05-04 DIAGNOSIS — E669 Obesity, unspecified: Secondary | ICD-10-CM | POA: Diagnosis not present

## 2018-05-04 DIAGNOSIS — E785 Hyperlipidemia, unspecified: Secondary | ICD-10-CM | POA: Insufficient documentation

## 2018-05-04 DIAGNOSIS — K922 Gastrointestinal hemorrhage, unspecified: Secondary | ICD-10-CM

## 2018-05-04 LAB — CBC WITH DIFFERENTIAL/PLATELET
Abs Immature Granulocytes: 0.03 10*3/uL (ref 0.00–0.07)
Basophils Absolute: 0 10*3/uL (ref 0.0–0.1)
Basophils Relative: 1 %
Eosinophils Absolute: 0.2 10*3/uL (ref 0.0–0.5)
Eosinophils Relative: 3 %
HCT: 30.1 % — ABNORMAL LOW (ref 39.0–52.0)
Hemoglobin: 9.9 g/dL — ABNORMAL LOW (ref 13.0–17.0)
Immature Granulocytes: 0 %
Lymphocytes Relative: 36 %
Lymphs Abs: 3.1 10*3/uL (ref 0.7–4.0)
MCH: 30.7 pg (ref 26.0–34.0)
MCHC: 32.9 g/dL (ref 30.0–36.0)
MCV: 93.5 fL (ref 80.0–100.0)
Monocytes Absolute: 0.8 10*3/uL (ref 0.1–1.0)
Monocytes Relative: 9 %
Neutro Abs: 4.4 10*3/uL (ref 1.7–7.7)
Neutrophils Relative %: 51 %
Platelets: 246 10*3/uL (ref 150–400)
RBC: 3.22 MIL/uL — ABNORMAL LOW (ref 4.22–5.81)
RDW: 14.2 % (ref 11.5–15.5)
WBC: 8.5 10*3/uL (ref 4.0–10.5)
nRBC: 0 % (ref 0.0–0.2)

## 2018-05-04 LAB — BASIC METABOLIC PANEL
Anion gap: 8 (ref 5–15)
BUN: 16 mg/dL (ref 8–23)
CO2: 19 mmol/L — ABNORMAL LOW (ref 22–32)
Calcium: 8.9 mg/dL (ref 8.9–10.3)
Chloride: 112 mmol/L — ABNORMAL HIGH (ref 98–111)
Creatinine, Ser: 1.47 mg/dL — ABNORMAL HIGH (ref 0.61–1.24)
GFR calc Af Amer: 56 mL/min — ABNORMAL LOW (ref >60)
GFR calc non Af Amer: 48 mL/min — ABNORMAL LOW (ref >60)
Glucose, Bld: 141 mg/dL — ABNORMAL HIGH (ref 70–99)
Potassium: 3.2 mmol/L — ABNORMAL LOW (ref 3.5–5.1)
Sodium: 139 mmol/L (ref 135–145)

## 2018-05-04 LAB — HEMOGLOBIN AND HEMATOCRIT, BLOOD
HCT: 26.3 % — ABNORMAL LOW (ref 39.0–52.0)
Hemoglobin: 8.7 g/dL — ABNORMAL LOW (ref 13.0–17.0)

## 2018-05-04 MED ORDER — PEG-KCL-NACL-NASULF-NA ASC-C 100 G PO SOLR: 1.0000 | Freq: Once | ORAL | Status: DC

## 2018-05-04 MED ORDER — PEG-KCL-NACL-NASULF-NA ASC-C 100 G PO SOLR
0.5000 | Freq: Once | ORAL | Status: AC
  Administered 2018-05-05: 100 g via ORAL

## 2018-05-04 MED ORDER — PEG-KCL-NACL-NASULF-NA ASC-C 100 G PO SOLR
0.5000 | Freq: Once | ORAL | Status: AC
  Administered 2018-05-04: 100 g via ORAL
  Filled 2018-05-04: qty 1

## 2018-05-04 MED ORDER — PANTOPRAZOLE SODIUM 40 MG IV SOLR: 40.0000 mg | Freq: Two times a day (BID) | INTRAVENOUS | Status: DC

## 2018-05-04 MED ORDER — SODIUM CHLORIDE 0.9 % IV SOLN
INTRAVENOUS | Status: DC
  Administered 2018-05-04: 100 mL/h via INTRAVENOUS
  Administered 2018-05-05: 01:00:00 via INTRAVENOUS

## 2018-05-04 MED ORDER — PANTOPRAZOLE SODIUM 40 MG IV SOLR
40.0000 mg | Freq: Once | INTRAVENOUS | Status: AC
  Administered 2018-05-04: 40 mg via INTRAVENOUS
  Filled 2018-05-04: qty 40

## 2018-05-04 MED ORDER — POTASSIUM CHLORIDE CRYS ER 20 MEQ PO TBCR
40.0000 meq | EXTENDED_RELEASE_TABLET | Freq: Once | ORAL | Status: AC
  Administered 2018-05-05: 40 meq via ORAL
  Filled 2018-05-04: qty 2

## 2018-05-04 NOTE — ED Notes (Signed)
Hospitalist at bedside 

## 2018-05-04 NOTE — Consult Note (Signed)
Referring Provider: [facility]  Primary Care Physician:  Nolene Ebbs, MD Primary Gastroenterologist:  Lucio Edward, MD     Reason for Consultation:   Gastrointestinal bleeding    ASSESSMENT / PLAN:    84. 68 yo male with recurrent painless hematochezia. Hemodynamically stable, hgb stable at 9.9. Patient just discharged home from hospital yesterday> He was hospitalized with hematochezia,  declined inpatient colonoscopy. Bleeding still seems most likely lower but upper bleed not excluded with certainty given NSAID use and reports of melenic stool on EDP's rectal exam 05/02/18 (not on oral iron at that time) -A two day bowel prep recommended at time of last colonoscopy Oct 2019 but patient mainly on clears for last few days while hospitalized. He did have solids yesterday. Will give clears today, 1/2 bowel prep now and full split prep later this evening to see if we can proceed with am colonoscopy.   2. Hypokalemia, K+ 3.2.  -Needs repletion in anticipation of procedure tomorrow.    HPI:      HPI: Leslie Livingston is a 68 y.o. male with HTN, arthritis, and history of adenomatous colon polyps. Patient was discharged from here yesterday after being hospitalized for painless hematochezia.  The bleed was felt most likely to be diverticular hemorrhage. His hgb had fallen from baseline of 13 to 8.9. He reported dizziness and received 2 uPRBC on 05/02/18. Once admitted he had no further bleeding. Hgb was 9.6 yesterday morning prior to discharge.  This am patient had another painless bloody BM. Describes dark red blood mixed with stool. Parts of the stool were "dark". He developed recurrent dizziness, presented to ED where he had another episode of hematochezia. No abdominal pain, nausea / vomiting.   ED EVALUATION:  BP -mildly hypotensive compared to baseline. One spurious reading of  64/52, on recheck it was 125/67.  WBC  8.5, Hgb 9.9, platelets 246, BUN 16, Cr 1.47.     Past Medical  History:  Diagnosis Date  . Allergy   . Arthritis   . Hyperlipidemia   . Hypertension     History reviewed. No pertinent surgical history.  Prior to Admission medications   Medication Sig Start Date End Date Taking? Authorizing Provider  amLODipine (NORVASC) 5 MG tablet Take 5 mg by mouth daily.    [provider]  aspirin EC 81 MG tablet Take 1 tablet (81 mg total) by mouth daily. 05/08/18   Nita Sells, MD  ferrous sulfate 325 (65 FE) MG tablet Take 1 tablet (325 mg total) by mouth 2 (two) times daily with a meal. 05/03/18 05/03/19  Nita Sells, MD  lisinopril (PRINIVIL,ZESTRIL) 20 MG tablet Take 20 mg by mouth daily. 02/23/18   [provider]  pantoprazole (PROTONIX) 40 MG tablet Take 1 tablet (40 mg total) by mouth 2 (two) times daily for 30 days. 05/03/18 06/02/18  Nita Sells, MD  simvastatin (ZOCOR) 10 MG tablet Take 10 mg by mouth daily. 04/27/18   [provider]    Current Facility-Administered Medications  Medication Dose Route Frequency Provider Last Rate Last Dose  . 0.9 %  sodium chloride infusion   Intravenous Continuous Virgel Manifold, MD 100 mL/hr at 05/04/18 1010 100 mL/hr at 05/04/18 1010   Current Outpatient Medications  Medication Sig Dispense Refill  . amLODipine (NORVASC) 5 MG tablet Take 5 mg by mouth daily.    Derrill Memo ON 05/08/2018] aspirin EC 81 MG tablet Take 1 tablet (81 mg total) by mouth daily.    Marland Kitchen  ferrous sulfate 325 (65 FE) MG tablet Take 1 tablet (325 mg total) by mouth 2 (two) times daily with a meal. 60 tablet 11  . lisinopril (PRINIVIL,ZESTRIL) 20 MG tablet Take 20 mg by mouth daily.    . pantoprazole (PROTONIX) 40 MG tablet Take 1 tablet (40 mg total) by mouth 2 (two) times daily for 30 days. 30 tablet 3  . simvastatin (ZOCOR) 10 MG tablet Take 10 mg by mouth daily.      Allergies as of 05/04/2018  . (No Known Allergies)    Family History  Problem Relation Age of Onset  . Colon cancer Neg Hx   .  Stomach cancer Neg Hx   . Esophageal cancer Neg Hx   . Rectal cancer Neg Hx     Social History   Socioeconomic History  . Marital status: Single    Spouse name: Not on file  . Number of children: Not on file  . Years of education: Not on file  . Highest education level: Not on file  Occupational History  . Not on file  Social Needs  . Financial resource strain: Not on file  . Food insecurity:    Worry: Not on file    Inability: Not on file  . Transportation needs:    Medical: Not on file    Non-medical: Not on file  Tobacco Use  . Smoking status: Current Some Day Smoker  . Smokeless tobacco: Never Used  Substance and Sexual Activity  . Alcohol use: Yes    Alcohol/week: 6.0 standard drinks    Types: 6 Cans of beer per week  . Drug use: No  . Sexual activity: Not on file  Lifestyle  . Physical activity:    Days per week: Not on file    Minutes per session: Not on file  . Stress: Not on file  Relationships  . Social connections:    Talks on phone: Not on file    Gets together: Not on file    Attends religious service: Not on file    Active member of club or organization: Not on file    Attends meetings of clubs or organizations: Not on file    Relationship status: Not on file  . Intimate partner violence:    Fear of current or ex partner: Not on file    Emotionally abused: Not on file    Physically abused: Not on file    Forced sexual activity: Not on file  Other Topics Concern  . Not on file  Social History Narrative  . Not on file    Review of Systems: All systems reviewed and negative except where noted in HPI.  Physical Exam: Vital signs in last 24 hours: Temp:  [98.3 F (36.8 C)] 98.3 F (36.8 C) (04/09 0927) Pulse Rate:  [52-71] 60 (04/09 1124) Resp:  [17] 17 (04/09 1124) BP: (92-134)/(55-65) 100/61 (04/09 1124) SpO2:  [93 %-100 %] 100 % (04/09 1124) Weight:  [106.6 kg] 106.6 kg (04/09 0928)   General:   Alert, well-developed,  male in NAD  Psych:  Pleasant, cooperative. Normal mood and affect. Eyes:  Pupils equal, sclera clear, no icterus.   Conjunctiva pink. Ears:  Normal auditory acuity. Nose:  No deformity, discharge,  or lesions. Neck:  Supple; no masses Lungs:  Clear throughout to auscultation.   No wheezes, crackles, or rhonchi.  Heart:  Regular rate and rhythm; no murmurs, no lower extremity edema Abdomen:  Soft, non-distended, nontender, BS active, no palp  mass    Rectal:  Scant amount of blood tinged mucoid material in vault  Msk:  Symmetrical without gross deformities. . Neurologic:  Alert and  oriented x4;  grossly normal neurologically. Skin:  Intact without significant lesions or rashes.   Intake/Output from previous day: No intake/output data recorded. Intake/Output this shift: No intake/output data recorded.  Lab Results: Recent Labs    05/02/18 1147 05/03/18 0515 05/04/18 0958  WBC 6.2 6.4 8.5  HGB 8.9* 9.6* 9.9*  HCT 27.7* 29.6* 30.1*  PLT 226 196 246   BMET Recent Labs    05/02/18 1147 05/03/18 0515 05/04/18 0958  NA 140 140 139  K 3.9 3.6 3.2*  CL 113* 113* 112*  CO2 22 22 19*  GLUCOSE 123* 100* 141*  BUN 27* 14 16  CREATININE 1.35* 1.19 1.47*  CALCIUM 8.6* 8.5* 8.9   LFT Recent Labs    05/02/18 1147  PROT 7.0  ALBUMIN 3.7  AST 13*  ALT 13  ALKPHOS 63  BILITOT 0.6   PT/INR Recent Labs    05/02/18 1147  LABPROT 13.2  INR 1.0   Hepatitis Panel No results for input(s): HEPBSAG, HCVAB, HEPAIGM, HEPBIGM in the last 72 hours.    Studies/Results: No results found.   Tye Savoy, NP-C @  05/04/2018, 11:54 AM

## 2018-05-04 NOTE — ED Provider Notes (Signed)
[facility] DEPT Provider Note   CSN: [id number] Arrival date & time: 05/04/18  4098    History   Chief Complaint Chief Complaint  Patient presents with  . Rectal Bleeding    HPI DEVANSH RIESE is a 68 y.o. male.     HPI   68 year old male with rectal bleeding.  He was just admitted for the same and discharged yesterday.  He was transfused 2 units of packed red blood cells.  He was feeling better and he was discharged with a plan for outpatient colonoscopy.  This morning he had what he says is a significant amount of bright red blood in his stool.  When I was initially talking to him he became very uncomfortable and stated that he needed use the bathroom.  He subsequently passed additional bright red blood.  He says he feels fatigued.  Short of breath with exertion.  No dizziness or lightheadedness.  Is not anticoagulated.  Past Medical History:  Diagnosis Date  . Allergy   . Arthritis   . Hyperlipidemia   . Hypertension     Patient Active Problem List   Diagnosis Date Noted  . GI bleed 05/02/2018  . ERECTILE DYSFUNCTION 06/06/2007  . ANEMIA-NOS 12/16/2006  . HYPERTENSION 12/16/2006  . HEMORRHOIDS, EXTERNAL 12/16/2006    History reviewed. No pertinent surgical history.      Home Medications    Prior to Admission medications   Medication Sig Start Date End Date Taking? Authorizing Provider  amLODipine (NORVASC) 5 MG tablet Take 5 mg by mouth daily.    [provider]  aspirin EC 81 MG tablet Take 1 tablet (81 mg total) by mouth daily. 05/08/18   Nita Sells, MD  ferrous sulfate 325 (65 FE) MG tablet Take 1 tablet (325 mg total) by mouth 2 (two) times daily with a meal. 05/03/18 05/03/19  Nita Sells, MD  lisinopril (PRINIVIL,ZESTRIL) 20 MG tablet Take 20 mg by mouth daily. 02/23/18   [provider]  pantoprazole (PROTONIX) 40 MG tablet Take 1 tablet (40 mg total) by mouth 2 (two) times daily for 30  days. 05/03/18 06/02/18  Nita Sells, MD  simvastatin (ZOCOR) 10 MG tablet Take 10 mg by mouth daily. 04/27/18   [provider]    Family History Family History  Problem Relation Age of Onset  . Colon cancer Neg Hx   . Stomach cancer Neg Hx   . Esophageal cancer Neg Hx   . Rectal cancer Neg Hx     Social History Social History   Tobacco Use  . Smoking status: Current Some Day Smoker  . Smokeless tobacco: Never Used  Substance Use Topics  . Alcohol use: Yes    Alcohol/week: 6.0 standard drinks    Types: 6 Cans of beer per week  . Drug use: No     Allergies   Patient has no known allergies.   Review of Systems Review of Systems  All systems reviewed and negative, other than as noted in HPI.  Physical Exam Updated Vital Signs BP 125/67   Pulse (!) 56   Temp 98.3 F (36.8 C) (Oral)   Resp 17   Ht 6\' 1"  (1.854 m)   Wt 106.6 kg   SpO2 100%   BMI 31.00 kg/m   Physical Exam Vitals signs and nursing note reviewed.  Constitutional:      General: He is not in acute distress.    Appearance: He is well-developed.  HENT:  Head: Normocephalic and atraumatic.  Eyes:     General:        Right eye: No discharge.        Left eye: No discharge.     Conjunctiva/sclera: Conjunctivae normal.  Neck:     Musculoskeletal: Neck supple.  Cardiovascular:     Rate and Rhythm: Normal rate and regular rhythm.     Heart sounds: Normal heart sounds. No murmur. No friction rub. No gallop.   Pulmonary:     Effort: Pulmonary effort is normal. No respiratory distress.     Breath sounds: Normal breath sounds.  Abdominal:     General: There is no distension.     Palpations: Abdomen is soft.     Tenderness: There is no abdominal tenderness.  Musculoskeletal:        General: No tenderness.  Skin:    General: Skin is warm and dry.  Neurological:     Mental Status: He is alert.  Psychiatric:        Behavior: Behavior normal.        Thought Content: Thought content  normal.      ED Treatments / Results  Labs (all labs ordered are listed, but only abnormal results are displayed) Labs Reviewed  CBC WITH DIFFERENTIAL/PLATELET - Abnormal; Notable for the following components:      Result Value   RBC 3.22 (*)    Hemoglobin 9.9 (*)    HCT 30.1 (*)    All other components within normal limits  BASIC METABOLIC PANEL - Abnormal; Notable for the following components:   Potassium 3.2 (*)    Chloride 112 (*)    CO2 19 (*)    Glucose, Bld 141 (*)    Creatinine, Ser 1.47 (*)    GFR calc non Af Amer 48 (*)    GFR calc Af Amer 56 (*)    All other components within normal limits    EKG None  Radiology No results found.  Procedures Procedures (including critical care time)  Medications Ordered in ED Medications  0.9 %  sodium chloride infusion (100 mL/hr Intravenous New Bag/Given 05/04/18 1010)  pantoprazole (PROTONIX) injection 40 mg (40 mg Intravenous Given 05/04/18 1003)     Initial Impression / Assessment and Plan / ED Course  I have reviewed the triage vital signs and the nursing notes.  Pertinent labs & imaging results that were available during my care of the patient were reviewed by me and considered in my medical decision making (see chart for details).  68 year old male with continued bright red blood per rectum.  Hemoglobin appears to be stable but he is symptomatic and still passing blood.  GI was consulted.  Medicine was consulted for admission.  He is not at the point that he needs repeat blood transfusion.  Need to trend his H&H.  Likely colonoscopy as an inpatient.  Final Clinical Impressions(s) / ED Diagnoses   Final diagnoses:  Rectal bleeding  Acute GI bleeding    ED Discharge Orders    None       Virgel Manifold, MD 05/04/18 1225

## 2018-05-04 NOTE — Anesthesia Preprocedure Evaluation (Addendum)
Anesthesia Evaluation  Patient identified by MRN, date of birth, ID band Patient awake    Reviewed: Allergy & Precautions, H&P , NPO status , Patient's Chart, lab work & pertinent test results  Airway Mallampati: II  TM Distance: >3 FB Neck ROM: Full    Dental no notable dental hx. (+) Teeth Intact, Dental Advisory Given   Pulmonary Current Smoker,    Pulmonary exam normal breath sounds clear to auscultation       Cardiovascular hypertension, Pt. on medications  Rhythm:Regular Rate:Normal     Neuro/Psych negative neurological ROS  negative psych ROS   GI/Hepatic negative GI ROS, Neg liver ROS,   Endo/Other  negative endocrine ROS  Renal/GU negative Renal ROS  negative genitourinary   Musculoskeletal  (+) Arthritis , Osteoarthritis,    Abdominal   Peds  Hematology  (+) Blood dyscrasia, anemia ,   Anesthesia Other Findings   Reproductive/Obstetrics negative OB ROS                            Anesthesia Physical Anesthesia Plan  ASA: II  Anesthesia Plan: MAC   Post-op Pain Management:    Induction: Intravenous  PONV Risk Score and Plan: 0 and Propofol infusion  Airway Management Planned: Simple Face Mask  Additional Equipment:   Intra-op Plan:   Post-operative Plan:   Informed Consent: I have reviewed the patients History and Physical, chart, labs and discussed the procedure including the risks, benefits and alternatives for the proposed anesthesia with the patient or authorized representative who has indicated his/her understanding and acceptance.     Dental advisory given  Plan Discussed with: CRNA  Anesthesia Plan Comments:         Anesthesia Quick Evaluation

## 2018-05-04 NOTE — ED Notes (Signed)
ED TO INPATIENT HANDOFF REPORT  ED Nurse Name and Phone #: [phone]  S Name/Age/Gender Leslie Livingston 68 y.o. male Room/Bed: WA25/WA25  Code Status   Code Status: Prior  Home/SNF/Other Home Patient oriented to: self, place, time and situation Is this baseline? Yes   Triage Complete: Triage complete  Chief Complaint Rectal Bleeding  Triage Note Patient c/o bright red and dark red rectal bleeding x 4 days. Patient states he was discharged from the hospital yesterday and reports that the rectal bleeding has never stopped. Patient denies any abdominal pain or rectal pain.   Allergies No Known Allergies  Level of Care/Admitting Diagnosis ED Disposition    ED Disposition Condition Comment   Admit  Hospital Area: [facility] [[id number]]  Level of Care: Med-Surg [16]  Diagnosis: Rectal bleed [829562]  Admitting Physician: Hosie Poisson [[id number]]  Attending Physician: Hosie Poisson [[id number]]  PT Class (Do Not Modify): Observation [104]  PT Acc Code (Do Not Modify): Observation [10022]       B Medical/Surgery History Past Medical History:  Diagnosis Date  . Allergy   . Arthritis   . Hyperlipidemia   . Hypertension    History reviewed. No pertinent surgical history.   A IV Location/Drains/Wounds Patient Lines/Drains/Airways Status   Active Line/Drains/Airways    Name:   Placement date:   Placement time:   Site:   Days:   Peripheral IV 05/04/18 Right Antecubital   05/04/18    0956    Antecubital   less than 1          Intake/Output Last 24 hours No intake or output data in the 24 hours ending 05/04/18 1241  Labs/Imaging Results for orders placed or performed during the hospital encounter of 05/04/18 (from the past 48 hour(s))  CBC with Differential     Status: Abnormal   Collection Time: 05/04/18  9:58 AM  Result Value Ref Range   WBC 8.5 4.0 - 10.5 K/uL   RBC 3.22 (L) 4.22 - 5.81 MIL/uL   Hemoglobin 9.9 (L) 13.0 - 17.0 g/dL   HCT 30.1  (L) 39.0 - 52.0 %   MCV 93.5 80.0 - 100.0 fL   MCH 30.7 26.0 - 34.0 pg   MCHC 32.9 30.0 - 36.0 g/dL   RDW 14.2 11.5 - 15.5 %   Platelets 246 150 - 400 K/uL   nRBC 0.0 0.0 - 0.2 %   Neutrophils Relative % 51 %   Neutro Abs 4.4 1.7 - 7.7 K/uL   Lymphocytes Relative 36 %   Lymphs Abs 3.1 0.7 - 4.0 K/uL   Monocytes Relative 9 %   Monocytes Absolute 0.8 0.1 - 1.0 K/uL   Eosinophils Relative 3 %   Eosinophils Absolute 0.2 0.0 - 0.5 K/uL   Basophils Relative 1 %   Basophils Absolute 0.0 0.0 - 0.1 K/uL   Immature Granulocytes 0 %   Abs Immature Granulocytes 0.03 0.00 - 0.07 K/uL    Comment: Performed at [facility], [place] [address] [place], [place] [address]  Basic metabolic panel     Status: Abnormal   Collection Time: 05/04/18  9:58 AM  Result Value Ref Range   Sodium 139 135 - 145 mmol/L   Potassium 3.2 (L) 3.5 - 5.1 mmol/L   Chloride 112 (H) 98 - 111 mmol/L   CO2 19 (L) 22 - 32 mmol/L   Glucose, Bld 141 (H) 70 - 99 mg/dL   BUN 16 8 - 23 mg/dL  Creatinine, Ser 1.47 (H) 0.61 - 1.24 mg/dL   Calcium 8.9 8.9 - 10.3 mg/dL   GFR calc non Af Amer 48 (L) >60 mL/min   GFR calc Af Amer 56 (L) >60 mL/min   Anion gap 8 5 - 15    Comment: Performed at [facility], [place] [address], [place], [place] [address]   No results found.  Pending Labs Unresulted Labs (From admission, onward)   None      Vitals/Pain Today's Vitals   05/04/18 1124 05/04/18 1130 05/04/18 1200 05/04/18 1230  BP: 100/61 (!) 64/52 125/67 125/70  Pulse: 60 (!) 56 (!) 56 (!) 54  Resp: 17 18 17 17   Temp:      TempSrc:      SpO2: 100% 100% 100% 100%  Weight:      Height:      PainSc:        Isolation Precautions No active isolations  Medications Medications  0.9 %  sodium chloride infusion (100 mL/hr Intravenous New Bag/Given 05/04/18 1010)  pantoprazole (PROTONIX) injection 40 mg (40 mg Intravenous Given 05/04/18 1003)    Mobility walks with device Low fall risk    Focused Assessments N/a   R Recommendations: See Admitting Provider Note  Report given to:   Additional Notes: Patient has been utilizing the steady due to his low hemoglobin so as not to risk a fall.

## 2018-05-04 NOTE — ED Notes (Signed)
Care handoff given to [facility] on 5w.

## 2018-05-04 NOTE — ED Triage Notes (Signed)
Patient c/o bright red and dark red rectal bleeding x 4 days. Patient states he was discharged from the hospital yesterday and reports that the rectal bleeding has never stopped. Patient denies any abdominal pain or rectal pain.

## 2018-05-04 NOTE — H&P (View-Only) (Signed)
Referring Provider: [facility]  Primary Care Physician:  Nolene Ebbs, MD Primary Gastroenterologist:  Lucio Edward, MD     Reason for Consultation:   Gastrointestinal bleeding    ASSESSMENT / PLAN:    88. 68 yo male with recurrent painless hematochezia. Hemodynamically stable, hgb stable at 9.9. Patient just discharged home from hospital yesterday> He was hospitalized with hematochezia,  declined inpatient colonoscopy. Bleeding still seems most likely lower but upper bleed not excluded with certainty given NSAID use and reports of melenic stool on EDP's rectal exam 05/02/18 (not on oral iron at that time) -A two day bowel prep recommended at time of last colonoscopy Oct 2019 but patient mainly on clears for last few days while hospitalized. He did have solids yesterday. Will give clears today, 1/2 bowel prep now and full split prep later this evening to see if we can proceed with am colonoscopy.   2. Hypokalemia, K+ 3.2.  -Needs repletion in anticipation of procedure tomorrow.    HPI:      HPI: Leslie Livingston is a 68 y.o. male with HTN, arthritis, and history of adenomatous colon polyps. Patient was discharged from here yesterday after being hospitalized for painless hematochezia.  The bleed was felt most likely to be diverticular hemorrhage. His hgb had fallen from baseline of 13 to 8.9. He reported dizziness and received 2 uPRBC on 05/02/18. Once admitted he had no further bleeding. Hgb was 9.6 yesterday morning prior to discharge.  This am patient had another painless bloody BM. Describes dark red blood mixed with stool. Parts of the stool were "dark". He developed recurrent dizziness, presented to ED where he had another episode of hematochezia. No abdominal pain, nausea / vomiting.   ED EVALUATION:  BP -mildly hypotensive compared to baseline. One spurious reading of  64/52, on recheck it was 125/67.  WBC  8.5, Hgb 9.9, platelets 246, BUN 16, Cr 1.47.     Past Medical  History:  Diagnosis Date  . Allergy   . Arthritis   . Hyperlipidemia   . Hypertension     History reviewed. No pertinent surgical history.  Prior to Admission medications   Medication Sig Start Date End Date Taking? Authorizing Provider  amLODipine (NORVASC) 5 MG tablet Take 5 mg by mouth daily.    [provider]  aspirin EC 81 MG tablet Take 1 tablet (81 mg total) by mouth daily. 05/08/18   Nita Sells, MD  ferrous sulfate 325 (65 FE) MG tablet Take 1 tablet (325 mg total) by mouth 2 (two) times daily with a meal. 05/03/18 05/03/19  Nita Sells, MD  lisinopril (PRINIVIL,ZESTRIL) 20 MG tablet Take 20 mg by mouth daily. 02/23/18   [provider]  pantoprazole (PROTONIX) 40 MG tablet Take 1 tablet (40 mg total) by mouth 2 (two) times daily for 30 days. 05/03/18 06/02/18  Nita Sells, MD  simvastatin (ZOCOR) 10 MG tablet Take 10 mg by mouth daily. 04/27/18   [provider]    Current Facility-Administered Medications  Medication Dose Route Frequency Provider Last Rate Last Dose  . 0.9 %  sodium chloride infusion   Intravenous Continuous Virgel Manifold, MD 100 mL/hr at 05/04/18 1010 100 mL/hr at 05/04/18 1010   Current Outpatient Medications  Medication Sig Dispense Refill  . amLODipine (NORVASC) 5 MG tablet Take 5 mg by mouth daily.    Derrill Memo ON 05/08/2018] aspirin EC 81 MG tablet Take 1 tablet (81 mg total) by mouth daily.    Marland Kitchen  ferrous sulfate 325 (65 FE) MG tablet Take 1 tablet (325 mg total) by mouth 2 (two) times daily with a meal. 60 tablet 11  . lisinopril (PRINIVIL,ZESTRIL) 20 MG tablet Take 20 mg by mouth daily.    . pantoprazole (PROTONIX) 40 MG tablet Take 1 tablet (40 mg total) by mouth 2 (two) times daily for 30 days. 30 tablet 3  . simvastatin (ZOCOR) 10 MG tablet Take 10 mg by mouth daily.      Allergies as of 05/04/2018  . (No Known Allergies)    Family History  Problem Relation Age of Onset  . Colon cancer Neg Hx   .  Stomach cancer Neg Hx   . Esophageal cancer Neg Hx   . Rectal cancer Neg Hx     Social History   Socioeconomic History  . Marital status: Single    Spouse name: Not on file  . Number of children: Not on file  . Years of education: Not on file  . Highest education level: Not on file  Occupational History  . Not on file  Social Needs  . Financial resource strain: Not on file  . Food insecurity:    Worry: Not on file    Inability: Not on file  . Transportation needs:    Medical: Not on file    Non-medical: Not on file  Tobacco Use  . Smoking status: Current Some Day Smoker  . Smokeless tobacco: Never Used  Substance and Sexual Activity  . Alcohol use: Yes    Alcohol/week: 6.0 standard drinks    Types: 6 Cans of beer per week  . Drug use: No  . Sexual activity: Not on file  Lifestyle  . Physical activity:    Days per week: Not on file    Minutes per session: Not on file  . Stress: Not on file  Relationships  . Social connections:    Talks on phone: Not on file    Gets together: Not on file    Attends religious service: Not on file    Active member of club or organization: Not on file    Attends meetings of clubs or organizations: Not on file    Relationship status: Not on file  . Intimate partner violence:    Fear of current or ex partner: Not on file    Emotionally abused: Not on file    Physically abused: Not on file    Forced sexual activity: Not on file  Other Topics Concern  . Not on file  Social History Narrative  . Not on file    Review of Systems: All systems reviewed and negative except where noted in HPI.  Physical Exam: Vital signs in last 24 hours: Temp:  [98.3 F (36.8 C)] 98.3 F (36.8 C) (04/09 0927) Pulse Rate:  [52-71] 60 (04/09 1124) Resp:  [17] 17 (04/09 1124) BP: (92-134)/(55-65) 100/61 (04/09 1124) SpO2:  [93 %-100 %] 100 % (04/09 1124) Weight:  [106.6 kg] 106.6 kg (04/09 0928)   General:   Alert, well-developed,  male in NAD  Psych:  Pleasant, cooperative. Normal mood and affect. Eyes:  Pupils equal, sclera clear, no icterus.   Conjunctiva pink. Ears:  Normal auditory acuity. Nose:  No deformity, discharge,  or lesions. Neck:  Supple; no masses Lungs:  Clear throughout to auscultation.   No wheezes, crackles, or rhonchi.  Heart:  Regular rate and rhythm; no murmurs, no lower extremity edema Abdomen:  Soft, non-distended, nontender, BS active, no palp  mass    Rectal:  Scant amount of blood tinged mucoid material in vault  Msk:  Symmetrical without gross deformities. . Neurologic:  Alert and  oriented x4;  grossly normal neurologically. Skin:  Intact without significant lesions or rashes.   Intake/Output from previous day: No intake/output data recorded. Intake/Output this shift: No intake/output data recorded.  Lab Results: Recent Labs    05/02/18 1147 05/03/18 0515 05/04/18 0958  WBC 6.2 6.4 8.5  HGB 8.9* 9.6* 9.9*  HCT 27.7* 29.6* 30.1*  PLT 226 196 246   BMET Recent Labs    05/02/18 1147 05/03/18 0515 05/04/18 0958  NA 140 140 139  K 3.9 3.6 3.2*  CL 113* 113* 112*  CO2 22 22 19*  GLUCOSE 123* 100* 141*  BUN 27* 14 16  CREATININE 1.35* 1.19 1.47*  CALCIUM 8.6* 8.5* 8.9   LFT Recent Labs    05/02/18 1147  PROT 7.0  ALBUMIN 3.7  AST 13*  ALT 13  ALKPHOS 63  BILITOT 0.6   PT/INR Recent Labs    05/02/18 1147  LABPROT 13.2  INR 1.0   Hepatitis Panel No results for input(s): HEPBSAG, HCVAB, HEPAIGM, HEPBIGM in the last 72 hours.    Studies/Results: No results found.   Tye Savoy, NP-C @  05/04/2018, 11:54 AM

## 2018-05-04 NOTE — H&P (Signed)
History and Physical    Leslie Livingston BPZ:[id number] DOB: [birth year] DOA: 05/04/2018  PCP: Nolene Ebbs, MD (Confirm with patient/family/NH records and if not entered, this has to be entered at [facility] point of entry) Patient coming from: Home   I have personally briefly reviewed patient's old medical records in [place]  Chief Complaint: recurrent rectal bleed.   HPI: Leslie Livingston is a 68 y.o. male with medical history significant of hypertension , hyperlipidemia, internal hemorrhoids.  presented initially to [facility] for rectal bleed on 4/7 and discharged on the  4/8 with plans to schedule colonoscopy as outpatient as his bleeding improved. He presents this am again with persistent rectal bleeding and some abdominal discomfort. No nausea or vomiting , dysuria,  chills or fevers, cough or chest pain or sob. He reports some dizziness after rectal bleeding. After arrival to ED, he reports his dizziness has improved. Pt reports using NSAID'S and aspirin in the past.    ED Course: on arrival to ED, he was found to be afebrile but profoundly hypotensive, and his hemoglobin stable around 9 and potassium of 3.2  He was given a dose of IV PPI and GI consulted.  He was referred to medical service for admission.   Review of Systems: As per HPI otherwise 10 point review of systems negative.    Past Medical History:  Diagnosis Date   Allergy    Arthritis    Hyperlipidemia    Hypertension     History reviewed. No pertinent surgical history.   reports that he has been smoking. He has never used smokeless tobacco. He reports current alcohol use of about 6.0 standard drinks of alcohol per week. He reports that he does not use drugs.  No Known Allergies  Family History  Problem Relation Age of Onset   Colon cancer Neg Hx    Stomach cancer Neg Hx    Esophageal cancer Neg Hx    Rectal cancer Neg Hx     Family history reviewed and pertinent  Prior to Admission  medications   Medication Sig Start Date End Date Taking? Authorizing Provider  amLODipine (NORVASC) 5 MG tablet Take 5 mg by mouth daily.    [provider]  aspirin EC 81 MG tablet Take 1 tablet (81 mg total) by mouth daily. 05/08/18   Nita Sells, MD  ferrous sulfate 325 (65 FE) MG tablet Take 1 tablet (325 mg total) by mouth 2 (two) times daily with a meal. 05/03/18 05/03/19  Nita Sells, MD  lisinopril (PRINIVIL,ZESTRIL) 20 MG tablet Take 20 mg by mouth daily. 02/23/18   [provider]  pantoprazole (PROTONIX) 40 MG tablet Take 1 tablet (40 mg total) by mouth 2 (two) times daily for 30 days. 05/03/18 06/02/18  Nita Sells, MD  simvastatin (ZOCOR) 10 MG tablet Take 10 mg by mouth daily. 04/27/18   [provider]    Physical Exam: Vitals:   05/04/18 1124 05/04/18 1130 05/04/18 1200 05/04/18 1230  BP: 100/61 (!) 64/52 125/67 125/70  Pulse: 60 (!) 56 (!) 56 (!) 54  Resp: 17 18 17 17   Temp:      TempSrc:      SpO2: 100% 100% 100% 100%  Weight:      Height:        Constitutional: NAD, calm, comfortable Vitals:   05/04/18 1124 05/04/18 1130 05/04/18 1200 05/04/18 1230  BP: 100/61 (!) 64/52 125/67 125/70  Pulse: 60 (!) 56 (!) 56 (!) 54  Resp: 17 18 17 17   Temp:      TempSrc:      SpO2: 100% 100% 100% 100%  Weight:      Height:       Eyes: PERRL, lids and conjunctivae normal ENMT: Mucous membranes are moist. Posterior pharynx clear of any exudate or lesions.Normal dentition.  Neck: normal, supple, no masses, no thyromegaly Respiratory: clear to auscultation bilaterally, no wheezing, no crackles. Normal respiratory effort. No accessory muscle use.  Cardiovascular: Regular rate and rhythm, no murmurs / rubs / gallops. No extremity edema. 2+ pedal pulses. No carotid bruits.  Abdomen: no tenderness, no masses palpated. No hepatosplenomegaly. Bowel sounds positive.  Musculoskeletal: no clubbing / cyanosis. No joint deformity upper and lower  extremities. Good ROM, no contractures. Normal muscle tone.  Skin: no rashes, lesions, ulcers. No induration Neurologic: CN 2-12 grossly intact. Sensation intact, DTR normal. Strength 5/5 in all 4.  Psychiatric: Normal judgment and insight. Alert and oriented x 3. Normal mood.     Labs on Admission: I have personally reviewed following labs and imaging studies  CBC: Recent Labs  Lab 05/02/18 1147 05/03/18 0515 05/04/18 0958  WBC 6.2 6.4 8.5  NEUTROABS 3.8  --  4.4  HGB 8.9* 9.6* 9.9*  HCT 27.7* 29.6* 30.1*  MCV 96.5 94.0 93.5  PLT 226 196 696   Basic Metabolic Panel: Recent Labs  Lab 05/02/18 1147 05/03/18 0515 05/04/18 0958  NA 140 140 139  K 3.9 3.6 3.2*  CL 113* 113* 112*  CO2 22 22 19*  GLUCOSE 123* 100* 141*  BUN 27* 14 16  CREATININE 1.35* 1.19 1.47*  CALCIUM 8.6* 8.5* 8.9   GFR: Estimated Creatinine Clearance: 61.6 mL/min (A) (by C-G formula based on SCr of 1.47 mg/dL (H)). Liver Function Tests: Recent Labs  Lab 05/02/18 1147  AST 13*  ALT 13  ALKPHOS 63  BILITOT 0.6  PROT 7.0  ALBUMIN 3.7   No results for input(s): LIPASE, AMYLASE in the last 168 hours. No results for input(s): AMMONIA in the last 168 hours. Coagulation Profile: Recent Labs  Lab 05/02/18 1147  INR 1.0   Cardiac Enzymes: No results for input(s): CKTOTAL, CKMB, CKMBINDEX, TROPONINI in the last 168 hours. BNP (last 3 results) No results for input(s): PROBNP in the last 8760 hours. HbA1C: No results for input(s): HGBA1C in the last 72 hours. CBG: No results for input(s): GLUCAP in the last 168 hours. Lipid Profile: No results for input(s): CHOL, HDL, LDLCALC, TRIG, CHOLHDL, LDLDIRECT in the last 72 hours. Thyroid Function Tests: No results for input(s): TSH, T4TOTAL, FREET4, T3FREE, THYROIDAB in the last 72 hours. Anemia Panel: No results for input(s): VITAMINB12, FOLATE, FERRITIN, TIBC, IRON, RETICCTPCT in the last 72 hours. Urine analysis:    Component Value Date/Time    COLORURINE YELLOW 11/28/2010 1436   APPEARANCEUR CLOUDY (A) 11/28/2010 1436   LABSPEC 1.012 11/28/2010 1436   PHURINE 5.0 11/28/2010 1436   GLUCOSEU NEGATIVE 11/28/2010 1436   HGBUR NEGATIVE 11/28/2010 1436   HGBUR negative 05/02/2007 0759   BILIRUBINUR NEGATIVE 11/28/2010 1436   KETONESUR NEGATIVE 11/28/2010 1436   PROTEINUR NEGATIVE 11/28/2010 1436   UROBILINOGEN 0.2 11/28/2010 1436   NITRITE NEGATIVE 11/28/2010 1436   LEUKOCYTESUR NEGATIVE 11/28/2010 1436    Radiological Exams on Admission: No results found.  EKG: not done.   Assessment/Plan Active Problems:   Rectal bleed    Persistent rectal bleeding.  Pt discharged yesterday and plan to schedule outpatient colonoscopy.  Readmitted for persistent  hematochezia and dizziness.  GI consulted and plan for colonoscopy in am.  IV Protonix BID.  Baseline hemoglobin around 12, currently stable around 9.  Transfuse to keep hemoglobin greater than 7.  H&H tonight and in the morning.    Acute blood loss anemia sec to rectal bleeding.  Baseline hemoglobin around 12, currently stable around 9.  Transfuse to keep hemoglobin greater than 7. NPO after midnight for colonoscopy in am.    Hypertension:  On arrival to ED, pt was hypotensive. Will hold BP meds at this time and add prn hydralazine.    Hyperlipidemia:  Holding statin for now.     Dizziness  Recheck orthostatic vital signs this admission.   AKI:  Gently hydrate, hold nephrotoxins and repeat renal parameters in am.  Possibly from blood loss, dehydration.  No dysuria symptoms.    DVT prophylaxis: scd's Code Status: full code Family Communication: none at bedside.  Disposition Plan: pending further work up.  Consults called: GI consult.  Admission status: obs/medsurg.    Hosie Poisson MD [facility] Pager [phone]  If 7PM-7AM, please contact night-coverage [email or web address] Password [email or web address]  05/04/2018, 1:10 PM

## 2018-05-05 ENCOUNTER — Observation Stay (HOSPITAL_COMMUNITY): Payer: Medicare HMO | Admitting: Anesthesiology

## 2018-05-05 ENCOUNTER — Encounter (HOSPITAL_COMMUNITY): Payer: Self-pay | Admitting: Certified Registered"

## 2018-05-05 ENCOUNTER — Encounter (HOSPITAL_COMMUNITY)
Admission: EM | Disposition: A | Discharge status: Home / Self Care | Payer: Self-pay | Source: Home / Self Care | Attending: Emergency Medicine

## 2018-05-05 DIAGNOSIS — I10 Essential (primary) hypertension: Secondary | ICD-10-CM | POA: Diagnosis not present

## 2018-05-05 DIAGNOSIS — D62 Acute posthemorrhagic anemia: Secondary | ICD-10-CM | POA: Diagnosis not present

## 2018-05-05 DIAGNOSIS — K219 Gastro-esophageal reflux disease without esophagitis: Secondary | ICD-10-CM

## 2018-05-05 DIAGNOSIS — E785 Hyperlipidemia, unspecified: Secondary | ICD-10-CM

## 2018-05-05 DIAGNOSIS — K5730 Diverticulosis of large intestine without perforation or abscess without bleeding: Secondary | ICD-10-CM | POA: Diagnosis not present

## 2018-05-05 DIAGNOSIS — D500 Iron deficiency anemia secondary to blood loss (chronic): Secondary | ICD-10-CM

## 2018-05-05 DIAGNOSIS — M1990 Unspecified osteoarthritis, unspecified site: Secondary | ICD-10-CM | POA: Diagnosis not present

## 2018-05-05 DIAGNOSIS — K5731 Diverticulosis of large intestine without perforation or abscess with bleeding: Secondary | ICD-10-CM

## 2018-05-05 DIAGNOSIS — K625 Hemorrhage of anus and rectum: Secondary | ICD-10-CM | POA: Diagnosis not present

## 2018-05-05 DIAGNOSIS — E669 Obesity, unspecified: Secondary | ICD-10-CM

## 2018-05-05 HISTORY — PX: COLONOSCOPY WITH PROPOFOL: SHX5780

## 2018-05-05 LAB — BASIC METABOLIC PANEL
Anion gap: 8 (ref 5–15)
BUN: 8 mg/dL (ref 8–23)
CO2: 22 mmol/L (ref 22–32)
Calcium: 8.8 mg/dL — ABNORMAL LOW (ref 8.9–10.3)
Chloride: 112 mmol/L — ABNORMAL HIGH (ref 98–111)
Creatinine, Ser: 1.15 mg/dL (ref 0.61–1.24)
GFR calc Af Amer: >60 mL/min (ref >60)
GFR calc non Af Amer: >60 mL/min (ref >60)
Glucose, Bld: 100 mg/dL — ABNORMAL HIGH (ref 70–99)
Potassium: 3.4 mmol/L — ABNORMAL LOW (ref 3.5–5.1)
Sodium: 142 mmol/L (ref 135–145)

## 2018-05-05 LAB — HEMOGLOBIN AND HEMATOCRIT, BLOOD
HCT: 25.4 % — ABNORMAL LOW (ref 39.0–52.0)
Hemoglobin: 8.3 g/dL — ABNORMAL LOW (ref 13.0–17.0)

## 2018-05-05 LAB — HIV ANTIBODY (ROUTINE TESTING W REFLEX): HIV Screen 4th Generation wRfx: NONREACTIVE

## 2018-05-05 SURGERY — COLONOSCOPY WITH PROPOFOL
Anesthesia: Monitor Anesthesia Care

## 2018-05-05 MED ORDER — SIMVASTATIN 10 MG PO TABS
10.0000 mg | ORAL_TABLET | Freq: Every day | ORAL | Status: DC
  Administered 2018-05-05: 10 mg via ORAL
  Filled 2018-05-05: qty 1

## 2018-05-05 MED ORDER — PROPOFOL 10 MG/ML IV BOLUS
INTRAVENOUS | Status: DC | PRN
  Administered 2018-05-05 (×2): 20 mg via INTRAVENOUS

## 2018-05-05 MED ORDER — PANTOPRAZOLE SODIUM 40 MG PO TBEC
40.0000 mg | DELAYED_RELEASE_TABLET | Freq: Every day | ORAL | Status: DC
  Administered 2018-05-05: 40 mg via ORAL
  Filled 2018-05-05 (×2): qty 1

## 2018-05-05 MED ORDER — LIDOCAINE 2% (20 MG/ML) 5 ML SYRINGE
INTRAMUSCULAR | Status: DC | PRN
  Administered 2018-05-05: 50 mg via INTRAVENOUS

## 2018-05-05 MED ORDER — PROPOFOL 10 MG/ML IV BOLUS
INTRAVENOUS | Status: AC
  Filled 2018-05-05: qty 40

## 2018-05-05 MED ORDER — AMLODIPINE BESYLATE 5 MG PO TABS
5.0000 mg | ORAL_TABLET | Freq: Every day | ORAL | Status: DC
  Administered 2018-05-05: 17:00:00 5 mg via ORAL
  Filled 2018-05-05 (×2): qty 1

## 2018-05-05 MED ORDER — LACTATED RINGERS IV SOLN
INTRAVENOUS | Status: DC | PRN
  Administered 2018-05-05: 08:00:00 via INTRAVENOUS

## 2018-05-05 MED ORDER — LISINOPRIL 10 MG PO TABS: 10.0000 mg | ORAL_TABLET | Freq: Every day | ORAL | Status: DC

## 2018-05-05 MED ORDER — PROPOFOL 500 MG/50ML IV EMUL
INTRAVENOUS | Status: DC | PRN
  Administered 2018-05-05: 150 ug/kg/min via INTRAVENOUS

## 2018-05-05 MED ORDER — FERROUS SULFATE 325 (65 FE) MG PO TABS
325.0000 mg | ORAL_TABLET | Freq: Two times a day (BID) | ORAL | Status: DC
  Administered 2018-05-05: 325 mg via ORAL
  Filled 2018-05-05 (×2): qty 1

## 2018-05-05 SURGICAL SUPPLY — 22 items

## 2018-05-05 NOTE — Interval H&P Note (Signed)
History and Physical Interval Note:  05/05/2018 8:27 AM  Leslie Livingston  has presented today for surgery, with the diagnosis of gastrointestinal bleeding.  The various methods of treatment have been discussed with the patient and family. After consideration of risks, benefits and other options for treatment, the patient has consented to  Procedure(s): COLONOSCOPY WITH PROPOFOL (N/A) as a surgical intervention.  The patient's history has been reviewed, patient examined, no change in status, stable for surgery.  I have reviewed the patient's chart and labs.  Questions were answered to the patient's satisfaction.     Scarlette Shorts

## 2018-05-05 NOTE — Op Note (Signed)
[facility] Patient Name: Leslie Livingston Procedure Date: 05/05/2018 MRN: [id number] Attending MD: Docia Chuck. Henrene Pastor , MD Date of Birth: [birth year] CSN: [id number] Age: 68 Admit Type: Inpatient Procedure:                Colonoscopy Indications:              Rectal bleeding Providers:                Docia Chuck. Henrene Pastor, MD, Burtis Junes, RN, Janie Billups,                            Technician, [facility], CRNA Referring MD:              Medicines:                Monitored Anesthesia Care Complications:            No immediate complications. Estimated blood loss:                            None. Estimated Blood Loss:     Estimated blood loss: none. Procedure:                Pre-Anesthesia Assessment:                           - Prior to the procedure, a History and Physical                            was performed, and patient medications and                            allergies were reviewed. The patient's tolerance of                            previous anesthesia was also reviewed. The risks                            and benefits of the procedure and the sedation                            options and risks were discussed with the patient.                            All questions were answered, and informed consent                            was obtained. Prior Anticoagulants: The patient has                            taken no previous anticoagulant or antiplatelet                            agents. ASA Grade Assessment: II - A patient with  mild systemic disease. After reviewing the risks                            and benefits, the patient was deemed in                            satisfactory condition to undergo the procedure.                           After obtaining informed consent, the colonoscope                            was passed under direct vision. Throughout the                            procedure, the patient's blood pressure,  pulse, and                            oxygen saturations were monitored continuously. The                            [id number]-[id number] ([id number] Olympus colonoscope was                            introduced through the anus and advanced to the the                            cecum, identified by appendiceal orifice and                            ileocecal valve. The ileocecal valve, appendiceal                            orifice, and rectum were photographed. The quality                            of the bowel preparation was good. The colonoscopy                            was performed without difficulty. The patient                            tolerated the procedure well. The bowel preparation                            used was SUPREP via split dose instruction. Scope In: 8:34:40 AM Scope Out: 4:03:47 AM Scope Withdrawal Time: 0 hours 10 minutes 24 seconds  Total Procedure Duration: 0 hours 14 minutes 38 seconds  Findings:      Multiple small and large-mouthed diverticula were found in the entire       colon.      The exam was otherwise without abnormality on direct and retroflexion       views. No active bleeding or blood in the colon. Impression:  1. Severe pandiverticulosis                           2. Self-limited diverticular bleed now resolved                           3. Acute blood loss anemia. Stable. Moderate Sedation:      none Recommendation:           1. Repeat colonoscopy is not recommended due to                            current age (53 years or older) for surveillance.                           2. Regular diet                           3. Okay for discharge from GI standpoint                           4. No routine GI follow-up required. GI follow-up                            as needed with his primary GI, Dr. Fuller Plan.                           The results of this examination were reviewed with                            the patient. He was also provided with  a copy of                            the colonoscopy report. Will sign off. Procedure Code(s):        --- Professional ---                           [id number], Colonoscopy, flexible; diagnostic, including                            collection of specimen(s) by brushing or washing,                            when performed (separate procedure) Diagnosis Code(s):        --- Professional ---                           K62.5, Hemorrhage of anus and rectum                           K57.30, Diverticulosis of large intestine without                            perforation or abscess without bleeding CPT copyright 2019 American Medical Association. All rights reserved. The codes documented in this report are preliminary and upon  coder review may  be revised to meet current compliance requirements. Docia Chuck. Henrene Pastor, MD 05/05/2018 9:08:03 AM This report has been signed electronically. Number of Addenda: 0

## 2018-05-05 NOTE — Anesthesia Postprocedure Evaluation (Signed)
Anesthesia Post Note  Patient: Leslie Livingston  Procedure(s) Performed: COLONOSCOPY WITH PROPOFOL (N/A )     Patient location during evaluation: Endoscopy Anesthesia Type: MAC Level of consciousness: awake and alert Pain management: pain level controlled Vital Signs Assessment: post-procedure vital signs reviewed and stable Respiratory status: spontaneous breathing, nonlabored ventilation and respiratory function stable Cardiovascular status: stable and blood pressure returned to baseline Postop Assessment: no apparent nausea or vomiting Anesthetic complications: no    Last Vitals:  Vitals:   05/05/18 0900 05/05/18 0910  BP: 135/81 138/71  Pulse: 62 (!) 57  Resp: 16 19  Temp:    SpO2: 99% 98%    Last Pain:  Vitals:   05/05/18 0910  TempSrc:   PainSc: 0-No pain                 Padraig Nhan,W. EDMOND

## 2018-05-05 NOTE — Transfer of Care (Signed)
Immediate Anesthesia Transfer of Care Note  Patient: Leslie Livingston  Procedure(s) Performed: COLONOSCOPY WITH PROPOFOL (N/A )  Patient Location: PACU  Anesthesia Type:MAC  Level of Consciousness: awake, alert  and oriented  Airway & Oxygen Therapy: Patient Spontanous Breathing and Patient connected to face mask oxygen  Post-op Assessment: Report given to RN and Post -op Vital signs reviewed and stable  Post vital signs: Reviewed and stable  Last Vitals:  Vitals Value Taken Time  BP 131/65 05/05/2018  8:56 AM  Temp    Pulse 61 05/05/2018  8:58 AM  Resp 19 05/05/2018  8:58 AM  SpO2 96 % 05/05/2018  8:58 AM  Vitals shown include unvalidated device data.  Last Pain:  Vitals:   05/05/18 0733  TempSrc: Oral  PainSc: 0-No pain         Complications: No apparent anesthesia complications

## 2018-05-05 NOTE — Progress Notes (Signed)
PROGRESS NOTE    Leslie Livingston  KGU:[id number] DOB: 01/18/1951 DOA: 05/04/2018 PCP: Nolene Ebbs, MD    Brief Narrative:  68 year old male who presented with recurrent rectal bleeding.  He does have significant past medical history for hypertension, dyslipidemia and internal hemorrhoids.  He had a recent hospitalization in 4/07, discharge 4/08 for rectal bleeding, the discharged with plan for outpatient colonoscopy.  Unfortunately he presented with recurrent rectal bleeding and abdominal discomfort.  Pressure was 100/61, pulse rate 60, respiratory rate 17, oxygen saturation 100%.  His lungs were clear to auscultation bilaterally, heart S1-S2 present rhythm, the abdomen was soft and nontender, no lower extremity edema.  Sodium 139, potassium 3.2, chloride 112, bicarb 19, glucose 141, BUN 16, creatinine 1.47, white count 8.5, hemoglobin 9.9, hematocrit 30.1, platelets 246.   Patient was admitted to the hospital with working diagnosis of recurrent rectal bleeding.  Assessment & Plan:   Principal Problem:   Diverticulosis of colon with hemorrhage Active Problems:   Essential hypertension   Iron deficiency anemia due to chronic blood loss   Dyslipidemia   Obesity (BMI 30-39.9)   GERD (gastroesophageal reflux disease)   Acute blood loss anemia   1. Acute blood loss anemia due to lower GI/ diverticular bleed. Patient underwent colonoscopy this am, positive severe pandiverticulosis, but no active bleeding/ self-limited bleeding. His Hgb has been trending down, today at 8.3 with Hct of 25. Will plan to check H&H in am, currently will hold on PRBC transfusion, unless patient becomes hemodynamically stable and or Hgb less than 7.   2. Iron deficiency anemia. Will resume oral Iron, will check iron panel in am.  3. AKI with hypokalemia. Pre-renal AKI, renal function has improved with IV fluids with serum cr down to 1,15 with K at 3,4 and serum bicarbonate at 22. Will correct K with po Kcl and  will follow on renal panel in am. Patient is tolerating po well, will hold on IV fluids for now.   4. HTN. Will resume amlodipine for blood pressure control, for now will hold on ace inh due to AKI.   5. GERD. Continue daily pantoprazole, PO.    6. Dyslipidemia. Will resume simvastatin.   7. Obesity. Calculated BMI is 31, will need follow up as outpatient.   DVT prophylaxis: scd   Code Status: full Family Communication: no family at the bedside  Disposition Plan/ discharge barriers: pending H&H in am, possible dc in am.   Body mass index is 31 kg/m. Malnutrition Type:      Malnutrition Characteristics:      Nutrition Interventions:     RN Pressure Injury Documentation:     Consultants:   GI  Procedures:     Antimicrobials:       Subjective: Patient had rectal bleeding last night while getting bowel preparation for colonoscopy.  This am no dizziness, no dyspnea or chest pain.   Objective: Vitals:   05/05/18 0900 05/05/18 0910 05/05/18 1009 05/05/18 1010  BP: 135/81 138/71 140/85   Pulse: 62 (!) 57 (!) 54   Resp: 16 19 18    Temp:    97.8 F (36.6 C)  TempSrc:    Oral  SpO2: 99% 98% 99%   Weight:      Height:        Intake/Output Summary (Last 24 hours) at 05/05/2018 1102 Last data filed at 05/05/2018 1035 Gross per 24 hour  Intake 3031.95 ml  Output -  Net 3031.95 ml   [employer]  05/04/18 0928  Weight: 106.6 kg    Examination:   General: Not in pain or dyspnea Neurology: Awake and alert, non focal  E ENT: mild pallor, no icterus, oral mucosa moist Cardiovascular: No JVD. S1-S2 present, rhythmic, no gallops, rubs, or murmurs. No lower extremity edema. Pulmonary: vesicular breath sounds bilaterally, adequate air movement, no wheezing, rhonchi or rales. Gastrointestinal. Abdomen with no organomegaly, non tender, no rebound or guarding Skin. No rashes Musculoskeletal: no joint deformities     Data Reviewed: I have personally  reviewed following labs and imaging studies  CBC: Recent Labs  Lab 05/02/18 1147 05/03/18 0515 05/04/18 0958 05/04/18 1835 05/05/18 0642  WBC 6.2 6.4 8.5  --   --   NEUTROABS 3.8  --  4.4  --   --   HGB 8.9* 9.6* 9.9* 8.7* 8.3*  HCT 27.7* 29.6* 30.1* 26.3* 25.4*  MCV 96.5 94.0 93.5  --   --   PLT 226 196 246  --   --    Basic Metabolic Panel: Recent Labs  Lab 05/02/18 1147 05/03/18 0515 05/04/18 0958 05/05/18 0642  NA 140 140 139 142  K 3.9 3.6 3.2* 3.4*  CL 113* 113* 112* 112*  CO2 22 22 19* 22  GLUCOSE 123* 100* 141* 100*  BUN 27* 14 16 8   CREATININE 1.35* 1.19 1.47* 1.15  CALCIUM 8.6* 8.5* 8.9 8.8*   GFR: Estimated Creatinine Clearance: 78.8 mL/min (by C-G formula based on SCr of 1.15 mg/dL). Liver Function Tests: Recent Labs  Lab 05/02/18 1147  AST 13*  ALT 13  ALKPHOS 63  BILITOT 0.6  PROT 7.0  ALBUMIN 3.7   No results for input(s): LIPASE, AMYLASE in the last 168 hours. No results for input(s): AMMONIA in the last 168 hours. Coagulation Profile: Recent Labs  Lab 05/02/18 1147  INR 1.0   Cardiac Enzymes: No results for input(s): CKTOTAL, CKMB, CKMBINDEX, TROPONINI in the last 168 hours. BNP (last 3 results) No results for input(s): PROBNP in the last 8760 hours. HbA1C: No results for input(s): HGBA1C in the last 72 hours. CBG: No results for input(s): GLUCAP in the last 168 hours. Lipid Profile: No results for input(s): CHOL, HDL, LDLCALC, TRIG, CHOLHDL, LDLDIRECT in the last 72 hours. Thyroid Function Tests: No results for input(s): TSH, T4TOTAL, FREET4, T3FREE, THYROIDAB in the last 72 hours. Anemia Panel: No results for input(s): VITAMINB12, FOLATE, FERRITIN, TIBC, IRON, RETICCTPCT in the last 72 hours.    Radiology Studies: I have reviewed all of the imaging during this hospital visit personally     Scheduled Meds: . pantoprazole (PROTONIX) IV  40 mg Intravenous Q12H  . potassium chloride  40 mEq Oral Once   Continuous  Infusions: . sodium chloride 100 mL/hr at 05/05/18 0042     LOS: 0 days        Robertson Colclough Gerome Apley, MD

## 2018-05-06 DIAGNOSIS — D62 Acute posthemorrhagic anemia: Secondary | ICD-10-CM | POA: Diagnosis not present

## 2018-05-06 DIAGNOSIS — E785 Hyperlipidemia, unspecified: Secondary | ICD-10-CM | POA: Diagnosis not present

## 2018-05-06 DIAGNOSIS — I10 Essential (primary) hypertension: Secondary | ICD-10-CM | POA: Diagnosis not present

## 2018-05-06 DIAGNOSIS — K5731 Diverticulosis of large intestine without perforation or abscess with bleeding: Secondary | ICD-10-CM | POA: Diagnosis not present

## 2018-05-06 LAB — HEMOGLOBIN AND HEMATOCRIT, BLOOD
HCT: 25.9 % — ABNORMAL LOW (ref 39.0–52.0)
Hemoglobin: 8.1 g/dL — ABNORMAL LOW (ref 13.0–17.0)

## 2018-05-06 LAB — BASIC METABOLIC PANEL
Anion gap: 6 (ref 5–15)
BUN: 11 mg/dL (ref 8–23)
CO2: 22 mmol/L (ref 22–32)
Calcium: 8.7 mg/dL — ABNORMAL LOW (ref 8.9–10.3)
Chloride: 112 mmol/L — ABNORMAL HIGH (ref 98–111)
Creatinine, Ser: 1.24 mg/dL (ref 0.61–1.24)
GFR calc Af Amer: >60 mL/min (ref >60)
GFR calc non Af Amer: 59 mL/min — ABNORMAL LOW (ref >60)
Glucose, Bld: 99 mg/dL (ref 70–99)
Potassium: 3.5 mmol/L (ref 3.5–5.1)
Sodium: 140 mmol/L (ref 135–145)

## 2018-05-06 LAB — IRON AND TIBC
Iron: 102 ug/dL (ref 45–182)
Saturation Ratios: 39 % (ref 17.9–39.5)
TIBC: 260 ug/dL (ref 250–450)
UIBC: 158 ug/dL

## 2018-05-06 LAB — FERRITIN: Ferritin: 190 ng/mL (ref 24–336)

## 2018-05-06 LAB — TRANSFERRIN: Transferrin: 186 mg/dL (ref 180–329)

## 2018-05-06 NOTE — Progress Notes (Signed)
Assessment unchanged. Pt verbalized understanding of dc instructions through teach back including meds to stop and those to resume. Understands to f/u with PCP in 7 days. Discharged via wc to front entrance accompanied by NT.

## 2018-05-06 NOTE — Discharge Summary (Signed)
Physician Discharge Summary  Leslie Livingston NID:[id number] DOB: [birth year] DOA: 05/04/2018  PCP: Nolene Ebbs, MD  Admit date: 05/04/2018 Discharge date: 05/06/2018  Admitted From: Home  Disposition:  Home   Recommendations for Outpatient Follow-up and new medication changes:  1. Follow up with Dr. Jeanie Cooks in 7 days.  2. Iron stores with no deficiency, Iron supplements have been held.  3. Follow on Hgb and Hct in 7 days.  4. Hold on aspirin for now.   Home Health: no   Equipment/Devices: no    Discharge Condition: stable  CODE STATUS: full  Diet recommendation: heart healthy   Brief/Interim Summary: 68 year old male who presented with recurrent rectal bleeding.  He does have significant past medical history of hypertension, dyslipidemia and internal hemorrhoids.  He had a recent hospitalization in 4/07, discharge 4/08 for rectal bleeding, with a discharged with plan for outpatient colonoscopy.  Unfortunately he presented with recurrent rectal bleeding and abdominal discomfort. On his initial physical examination is blood pressure was 100/61, pulse rate 60, respiratory rate 17, oxygen saturation 100%.  His lungs were clear to auscultation bilaterally, heart S1-S2 present rhythmic, his abdomen was soft and nontender, no lower extremity edema. Sodium 139, potassium 3.2, chloride 112, bicarb 19, glucose 141, BUN 16, creatinine 1.47, white count 8.5, hemoglobin 9.9, hematocrit 30.1, platelets 246.   Patient was admitted to the hospital with working diagnosis of recurrent rectal bleeding.  1.  Acute blood loss anemia due to lower GI bleed/diverticular bleed.  Patient was admitted to the medical ward, he was placed on a remote telemetry monitor, aspirin was held.  His repeat hemoglobin dropped to 8.1, hematocrit 25.9.  Further work-up with colonoscopy revealed severe pan diverticulosis, no signs of active bleeding.  Apparently his diverticular bleed had self-contained.  Patient will follow-up as  an outpatient.  His discharge hemoglobin is 8.1.  Continue to hold aspirin.  Follow-up with cell count in 7 days.  2.  Anemia of chronic disease with prior iron deficiency.  Has been taking iron supplements, his serum iron was 102, TIBC 260, ferritin 190, transferrin 186, transferrin saturation 39%.  Iron supplements were held, recommend follow-up as an outpatient.  3.  Acute kidney injury on chronic kidney disease stage II with hypokalemia.  Patient received supportive medical therapy, including IV fluids, K was corrected with po Kcl. His discharge creatinine is 1.24, potassium 3.5.  Follow-up as an outpatient.  4.  Hypertension.  Lisinopril was held due to acute kidney injury, amlodipine was continued.  At discharge patient will resume lisinopril.  5.  GERD.  Continue pantoprazole.  6.  Dyslipidemia.  Patient taking simvastatin.  7.  Obesity.  Calculated BMI 31, follow-up as an outpatient.   Discharge Diagnoses:  Principal Problem:   Diverticulosis of colon with hemorrhage Active Problems:   Essential hypertension   Iron deficiency anemia due to chronic blood loss   Dyslipidemia   Obesity (BMI 30-39.9)   GERD (gastroesophageal reflux disease)   Acute blood loss anemia    Discharge Instructions   Allergies as of 05/06/2018   No Known Allergies     Medication List    STOP taking these medications   aspirin EC 81 MG tablet   ferrous sulfate 325 (65 FE) MG tablet     TAKE these medications   amLODipine 5 MG tablet Commonly known as:  NORVASC Take 5 mg by mouth daily.   lisinopril 20 MG tablet Commonly known as:  PRINIVIL,ZESTRIL Take 20 mg by mouth daily.  pantoprazole 40 MG tablet Commonly known as:  Protonix Take 1 tablet (40 mg total) by mouth 2 (two) times daily for 30 days.   simvastatin 10 MG tablet Commonly known as:  ZOCOR Take 10 mg by mouth daily.       No Known Allergies  Consultations:  GI    Procedures/Studies:  No results  found.   Procedures:  Colonoscopy   Subjective: No further bleeding, no nausea or vomiting, no abdominal pain. Tolerating po well.   Discharge Exam: Vitals:   05/05/18 2123 05/06/18 0524  BP: 131/78 (!) 142/78  Pulse: (!) 55 69  Resp: 16 17  Temp: 98.5 F (36.9 C) 98.8 F (37.1 C)  SpO2: 98% 98%   Vitals:   05/05/18 1010 05/05/18 1318 05/05/18 2123 05/06/18 0524  BP:  (!) 146/79 131/78 (!) 142/78  Pulse:  (!) 57 (!) 55 69  Resp:  18 16 17   Temp: 97.8 F (36.6 C) 98.2 F (36.8 C) 98.5 F (36.9 C) 98.8 F (37.1 C)  TempSrc: Oral Oral Oral Oral  SpO2:  99% 98% 98%  Weight:      Height:        General: Not in pain or dyspnea  Neurology: Awake and alert, non focal  E ENT: no pallor, no icterus, oral mucosa moist Cardiovascular: No JVD. S1-S2 present, rhythmic, no gallops, rubs, or murmurs. No lower extremity edema. Pulmonary: vesicular breath sounds bilaterally, adequate air movement, no wheezing, rhonchi or rales. Gastrointestinal. Abdomen with no organomegaly, non tender, no rebound or guarding Skin. No rashes Musculoskeletal: no joint deformities   The results of significant diagnostics from this hospitalization (including imaging, microbiology, ancillary and laboratory) are listed below for reference.     Microbiology: No results found for this or any previous visit (from the past 240 hour(s)).   Labs: BNP (last 3 results) No results for input(s): BNP in the last 8760 hours. Basic Metabolic Panel: Recent Labs  Lab 05/02/18 1147 05/03/18 0515 05/04/18 0958 05/05/18 0642 05/06/18 0239  NA 140 140 139 142 140  K 3.9 3.6 3.2* 3.4* 3.5  CL 113* 113* 112* 112* 112*  CO2 22 22 19* 22 22  GLUCOSE 123* 100* 141* 100* 99  BUN 27* 14 16 8 11   CREATININE 1.35* 1.19 1.47* 1.15 1.24  CALCIUM 8.6* 8.5* 8.9 8.8* 8.7*   Liver Function Tests: Recent Labs  Lab 05/02/18 1147  AST 13*  ALT 13  ALKPHOS 63  BILITOT 0.6  PROT 7.0  ALBUMIN 3.7   No results for  input(s): LIPASE, AMYLASE in the last 168 hours. No results for input(s): AMMONIA in the last 168 hours. CBC: Recent Labs  Lab 05/02/18 1147 05/03/18 0515 05/04/18 0958 05/04/18 1835 05/05/18 0642 05/06/18 0239  WBC 6.2 6.4 8.5  --   --   --   NEUTROABS 3.8  --  4.4  --   --   --   HGB 8.9* 9.6* 9.9* 8.7* 8.3* 8.1*  HCT 27.7* 29.6* 30.1* 26.3* 25.4* 25.9*  MCV 96.5 94.0 93.5  --   --   --   PLT 226 196 246  --   --   --    Cardiac Enzymes: No results for input(s): CKTOTAL, CKMB, CKMBINDEX, TROPONINI in the last 168 hours. BNP: Invalid input(s): POCBNP CBG: No results for input(s): GLUCAP in the last 168 hours. D-Dimer No results for input(s): DDIMER in the last 72 hours. Hgb A1c No results for input(s): HGBA1C in the last 72  hours. Lipid Profile No results for input(s): CHOL, HDL, LDLCALC, TRIG, CHOLHDL, LDLDIRECT in the last 72 hours. Thyroid function studies No results for input(s): TSH, T4TOTAL, T3FREE, THYROIDAB in the last 72 hours.  Invalid input(s): FREET3 Anemia work up Recent Labs    05/06/18 0239  FERRITIN 190  TIBC 260  IRON 102   Urinalysis    Component Value Date/Time   COLORURINE YELLOW 11/28/2010 1436   APPEARANCEUR CLOUDY (A) 11/28/2010 1436   LABSPEC 1.012 11/28/2010 1436   PHURINE 5.0 11/28/2010 1436   GLUCOSEU NEGATIVE 11/28/2010 1436   HGBUR NEGATIVE 11/28/2010 1436   HGBUR negative 05/02/2007 0759   BILIRUBINUR NEGATIVE 11/28/2010 1436   KETONESUR NEGATIVE 11/28/2010 1436   PROTEINUR NEGATIVE 11/28/2010 1436   UROBILINOGEN 0.2 11/28/2010 1436   NITRITE NEGATIVE 11/28/2010 1436   LEUKOCYTESUR NEGATIVE 11/28/2010 1436   Sepsis Labs Invalid input(s): PROCALCITONIN,  WBC,  LACTICIDVEN Microbiology No results found for this or any previous visit (from the past 240 hour(s)).   Time coordinating discharge: 45 minutes  SIGNED:   Tawni Millers, MD  [facility] 05/06/2018, 8:36 AM

## 2018-05-07 ENCOUNTER — Encounter (HOSPITAL_COMMUNITY): Payer: Self-pay | Admitting: Internal Medicine

## 2018-05-09 ENCOUNTER — Encounter: Payer: Medicare HMO | Admitting: Gastroenterology

## 2018-09-06 ENCOUNTER — Other Ambulatory Visit: Payer: Self-pay

## 2018-09-06 DIAGNOSIS — Z20822 Contact with and (suspected) exposure to covid-19: Secondary | ICD-10-CM

## 2018-09-07 LAB — NOVEL CORONAVIRUS, NAA: SARS-CoV-2, NAA: NOT DETECTED

## 2019-01-09 ENCOUNTER — Encounter: Payer: Self-pay | Admitting: Gastroenterology

## 2021-01-17 ENCOUNTER — Encounter (HOSPITAL_COMMUNITY): Payer: Self-pay

## 2021-01-17 ENCOUNTER — Ambulatory Visit (HOSPITAL_COMMUNITY)
Admission: EM | Admit: 2021-01-17 | Discharge: 2021-01-17 | Disposition: A | Discharge status: Home / Self Care | Payer: Medicare Other | Attending: Physician Assistant | Admitting: Physician Assistant

## 2021-01-17 ENCOUNTER — Other Ambulatory Visit: Payer: Self-pay

## 2021-01-17 ENCOUNTER — Ambulatory Visit (INDEPENDENT_AMBULATORY_CARE_PROVIDER_SITE_OTHER): Payer: Medicare Other

## 2021-01-17 DIAGNOSIS — S62512A Displaced fracture of proximal phalanx of left thumb, initial encounter for closed fracture: Secondary | ICD-10-CM

## 2021-01-17 NOTE — ED Provider Notes (Signed)
[facility]    CSN: [id number] Arrival date & time: 01/17/21  1109      History   Chief Complaint Chief Complaint  Patient presents with   Finger Injury    HPI Leslie Livingston is a 70 y.o. male.   Patient here today for evaluation of left thumb injury that occurred when he was trying to brace the fall 2 days ago.  He states that he sustained a small cut when he fell on the concrete but denies any piercing wound.  He states that he did have immediate pain but did not suspect a break until today when he noticed more swelling in the area and continues to have pain with movement.  He denies any numbness or tingling.  He does not report treatment for symptoms.  The history is provided by the patient.   Past Medical History:  Diagnosis Date   Allergy    Arthritis    Hyperlipidemia    Hypertension     Patient Active Problem List   Diagnosis Date Noted   Iron deficiency anemia due to chronic blood loss 05/05/2018   Dyslipidemia 05/05/2018   Obesity (BMI 30-39.9) 05/05/2018   GERD (gastroesophageal reflux disease) 05/05/2018   Acute blood loss anemia 05/05/2018   Diverticulosis of colon with hemorrhage    Rectal bleed 05/04/2018   GI bleed 05/02/2018   ERECTILE DYSFUNCTION 06/06/2007   ANEMIA-NOS 12/16/2006   Essential hypertension 12/16/2006   HEMORRHOIDS, EXTERNAL 12/16/2006    Past Surgical History:  Procedure Laterality Date   COLONOSCOPY WITH PROPOFOL N/A 05/05/2018   Procedure: COLONOSCOPY WITH PROPOFOL;  Surgeon: Irene Shipper, MD;  Location: [facility];  Service: Endoscopy;  Laterality: N/A;       Home Medications    Prior to Admission medications   Medication Sig Start Date End Date Taking? Authorizing Provider  amLODipine (NORVASC) 5 MG tablet Take 5 mg by mouth daily.    [provider]  lisinopril (PRINIVIL,ZESTRIL) 20 MG tablet Take 20 mg by mouth daily. 02/23/18   [provider]  pantoprazole (PROTONIX) 40 MG tablet  Take 1 tablet (40 mg total) by mouth 2 (two) times daily for 30 days. 05/03/18 06/02/18  Nita Sells, MD  simvastatin (ZOCOR) 10 MG tablet Take 10 mg by mouth daily. 04/27/18   [provider]    Family History Family History  Problem Relation Age of Onset   Colon cancer Neg Hx    Stomach cancer Neg Hx    Esophageal cancer Neg Hx    Rectal cancer Neg Hx     Social History Social History   Tobacco Use   Smoking status: Some Days   Smokeless tobacco: Never  Vaping Use   Vaping Use: Never used  Substance Use Topics   Alcohol use: Yes    Alcohol/week: 6.0 standard drinks    Types: 6 Cans of beer per week   Drug use: No     Allergies   Patient has no known allergies.   Review of Systems Review of Systems  Constitutional:  Negative for chills and fever.  Eyes:  Negative for discharge and redness.  Respiratory:  Negative for shortness of breath.   Gastrointestinal:  Negative for nausea and vomiting.  Musculoskeletal:  Positive for arthralgias and joint swelling.  Skin:  Positive for color change and wound.  Neurological:  Negative for numbness.    Physical Exam Triage Vital Signs ED Triage Vitals  Enc Vitals Group  BP 01/17/21 1200 140/68     Pulse Rate 01/17/21 1158 64     Resp 01/17/21 1158 17     Temp 01/17/21 1158 98.5 F (36.9 C)     Temp Source 01/17/21 1158 Oral     SpO2 01/17/21 1158 98 %     Weight --      Height --      Head Circumference --      Peak Flow --      Pain Score 01/17/21 1200 6     Pain Loc --      Pain Edu? --      Excl. in [place]? --    No data found.  Updated Vital Signs BP 140/68 (BP Location: Right Arm)    Pulse 64    Temp 98.5 F (36.9 C) (Oral)    Resp 17    SpO2 98%    Physical Exam Vitals and nursing note reviewed.  Constitutional:      General: He is not in acute distress.    Appearance: Normal appearance. He is not ill-appearing.  HENT:     Head: Normocephalic and atraumatic.  Eyes:      Conjunctiva/sclera: Conjunctivae normal.  Cardiovascular:     Rate and Rhythm: Normal rate.  Pulmonary:     Effort: Pulmonary effort is normal.  Musculoskeletal:     Comments: Full ROM of left wrist, left fingers, with exception of left 1st MCP- swelling noted to same  Skin:    Comments: Small superficial abrasion noted between 1st and 2nd left fingers, normal coloration to left thumb diffusely  Neurological:     Mental Status: He is alert.     Comments: Gross sensation intact to distal left thumb  Psychiatric:        Mood and Affect: Mood normal.        Behavior: Behavior normal.        Thought Content: Thought content normal.     UC Treatments / Results  Labs (all labs ordered are listed, but only abnormal results are displayed) Labs Reviewed - No data to display  EKG   Radiology DG Hand Complete Left  Result Date: 01/17/2021 CLINICAL DATA:  LEFT thumb injury after trying to brace against a fall 2 days ago EXAM: LEFT HAND - COMPLETE 3+ VIEW COMPARISON:  None FINDINGS: Osseous demineralization. Joint spaces preserved. Mildly displaced fracture fragment identified at base of proximal phalanx LEFT thumb, suspect representing a volar plate avulsion injury though this is not well profiled. No additional fracture, dislocation, or bone destruction. IMPRESSION: Displaced fracture fragment at first MCP joint likely representing a displaced volar plate avulsion fracture at the base of the proximal phalanx LEFT thumb. Electronically Signed   By: Lavonia Dana M.D.   On: 01/17/2021 12:51    Procedures Procedures (including critical care time)  Medications Ordered in UC Medications - No data to display  Initial Impression / Assessment and Plan / UC Course  I have reviewed the triage vital signs and the nursing notes.  Pertinent labs & imaging results that were available during my care of the patient were reviewed by me and considered in my medical decision making (see chart for  details).    Xray with volar plate avulsion fracture-- given duration since fracture will splint in office and recommended follow up with ortho early next week. Patient expresses understanding. Encourage sooner follow up with any further concerns.   Final Clinical Impressions(s) / UC Diagnoses   Final diagnoses:  Closed displaced fracture of proximal phalanx of left thumb, initial encounter   Discharge Instructions   None    ED Prescriptions   None    PDMP not reviewed this encounter.   Francene Finders, PA-C 01/17/21 1353

## 2021-01-17 NOTE — ED Triage Notes (Signed)
Pt presents with left thumb injury after trying to brace his fall X 2 days ago.   Pt has a small cut in between thumb & index finger with swelling and discoloration.

## 2021-05-05 ENCOUNTER — Other Ambulatory Visit: Payer: Self-pay

## 2021-05-05 ENCOUNTER — Encounter (HOSPITAL_COMMUNITY): Payer: Self-pay | Admitting: Emergency Medicine

## 2021-05-05 ENCOUNTER — Ambulatory Visit (HOSPITAL_COMMUNITY)
Admission: EM | Admit: 2021-05-05 | Discharge: 2021-05-05 | Disposition: A | Discharge status: Home / Self Care | Payer: Medicare Other | Attending: Nurse Practitioner | Admitting: Nurse Practitioner

## 2021-05-05 DIAGNOSIS — J309 Allergic rhinitis, unspecified: Secondary | ICD-10-CM

## 2021-05-05 DIAGNOSIS — J029 Acute pharyngitis, unspecified: Secondary | ICD-10-CM | POA: Diagnosis not present

## 2021-05-05 MED ORDER — FLUTICASONE PROPIONATE 50 MCG/ACT NA SUSP: 2.0000 | Freq: Every day | NASAL | 0 refills | Status: DC

## 2021-05-05 MED ORDER — CETIRIZINE HCL 10 MG PO TABS: 10.0000 mg | ORAL_TABLET | Freq: Every day | ORAL | 0 refills | Status: DC

## 2021-05-05 NOTE — ED Triage Notes (Signed)
Patient has a sore throat right ear pain that started 2-3 weeks ago.  Pain was intermittent.  Pain is about the same as it has been ?

## 2021-05-05 NOTE — ED Provider Notes (Signed)
?[facility] ? ? ? ?CSN: [id number] ?Arrival date & time: 05/05/21  1019 ? ? ?  ? ?History   ?Chief Complaint ?Chief Complaint  ?Patient presents with  ? Sore Throat  ? ? ?HPI ?BLADYN TIPPS is a 71 y.o. male.  ? ?The patient is a 71 year old male who presents for sore throat.  Patient states symptoms have been present for the past month.  Symptoms have been waxing and waning since they started.  He also complains of pain in the left ear.  Pain in the ear is intermittent.  He denies fever, chills, nasal congestion, runny nose.  He states that he does have to clear his throat throughout the day several times.  He also states that his throat pain is worse in the morning and at night.  States that he has been outside working, but does not notice any change in his symptoms.  He also has a history of reflux, states he takes his medication regularly.  He has been taking Advil for symptoms with good relief.  He is eating and drinking without difficulty. ? ? ?Sore Throat ?This is a new problem. Pertinent negatives include no headaches and no shortness of breath.  ? ?Past Medical History:  ?Diagnosis Date  ? Allergy   ? Arthritis   ? Hyperlipidemia   ? Hypertension   ? ? ?Patient Active Problem List  ? Diagnosis Date Noted  ? Iron deficiency anemia due to chronic blood loss 05/05/2018  ? Dyslipidemia 05/05/2018  ? Obesity (BMI 30-39.9) 05/05/2018  ? GERD (gastroesophageal reflux disease) 05/05/2018  ? Acute blood loss anemia 05/05/2018  ? Diverticulosis of colon with hemorrhage   ? Rectal bleed 05/04/2018  ? GI bleed 05/02/2018  ? ERECTILE DYSFUNCTION 06/06/2007  ? ANEMIA-NOS 12/16/2006  ? Essential hypertension 12/16/2006  ? HEMORRHOIDS, EXTERNAL 12/16/2006  ? ? ?Past Surgical History:  ?Procedure Laterality Date  ? COLONOSCOPY WITH PROPOFOL N/A 05/05/2018  ? Procedure: COLONOSCOPY WITH PROPOFOL;  Surgeon: Irene Shipper, MD;  Location: [facility];  Service: Endoscopy;  Laterality: N/A;  ? ? ? ? ? ?Home  Medications   ? ?Prior to Admission medications   ?Medication Sig Start Date End Date Taking? Authorizing Provider  ?amLODipine (NORVASC) 5 MG tablet Take 5 mg by mouth daily.    [provider]  ?lisinopril (PRINIVIL,ZESTRIL) 20 MG tablet Take 20 mg by mouth daily. 02/23/18   [provider]  ?pantoprazole (PROTONIX) 40 MG tablet Take 1 tablet (40 mg total) by mouth 2 (two) times daily for 30 days. 05/03/18 06/02/18  Nita Sells, MD  ?simvastatin (ZOCOR) 10 MG tablet Take 10 mg by mouth daily. 04/27/18   [provider]  ? ? ?Family History ?Family History  ?Problem Relation Age of Onset  ? Colon cancer Neg Hx   ? Stomach cancer Neg Hx   ? Esophageal cancer Neg Hx   ? Rectal cancer Neg Hx   ? ? ?Social History ?Social History  ? ?Tobacco Use  ? Smoking status: Some Days  ? Smokeless tobacco: Never  ?Vaping Use  ? Vaping Use: Never used  ?Substance Use Topics  ? Alcohol use: Yes  ?  Alcohol/week: 6.0 standard drinks  ?  Types: 6 Cans of beer per week  ? Drug use: No  ? ? ? ?Allergies   ?Patient has no known allergies. ? ? ?Review of Systems ?Review of Systems  ?Constitutional: Negative.   ?HENT:  Positive for ear pain (left) and sore  throat.   ?Eyes: Negative.   ?Respiratory:  Positive for cough. Negative for shortness of breath and wheezing.   ?Cardiovascular: Negative.   ?Gastrointestinal: Negative.   ?Skin: Negative.   ?Neurological:  Negative for headaches.  ?Psychiatric/Behavioral: Negative.    ? ? ?Physical Exam ?Triage Vital Signs ?ED Triage Vitals  ?Enc Vitals Group  ?   BP 05/05/21 1132 134/80  ?   Pulse Rate 05/05/21 1132 (!) 56  ?   Resp 05/05/21 1132 18  ?   Temp 05/05/21 1132 98.5 ?F (36.9 ?C)  ?   Temp Source 05/05/21 1132 Oral  ?   SpO2 05/05/21 1132 96 %  ?   Weight --   ?   Height --   ?   Head Circumference --   ?   Peak Flow --   ?   Pain Score 05/05/21 1129 5  ?   Pain Loc --   ?   Pain Edu? --   ?   Excl. in [place]? --   ? ?No data found. ? ?Updated Vital Signs ?BP  134/80 (BP Location: Right Arm) Comment: has not had medicines today Comment (BP Location): large cuff  Pulse (!) 56   Temp 98.5 ?F (36.9 ?C) (Oral)   Resp 18   SpO2 96%  ? ?Visual Acuity ?Right Eye Distance:   ?Left Eye Distance:   ?Bilateral Distance:   ? ?Right Eye Near:   ?Left Eye Near:    ?Bilateral Near:    ? ?Physical Exam ?Vitals reviewed.  ?Constitutional:   ?   Appearance: He is well-developed.  ?HENT:  ?   Head: Normocephalic and atraumatic.  ?   Right Ear: Tympanic membrane and ear canal normal.  ?   Left Ear: Ear canal normal. A middle ear effusion is present. Tympanic membrane is not erythematous.  ?   Nose: No congestion or rhinorrhea.  ?   Mouth/Throat:  ?   Mouth: Mucous membranes are moist.  ?   Pharynx: Posterior oropharyngeal erythema present. No pharyngeal swelling.  ?Eyes:  ?   Conjunctiva/sclera: Conjunctivae normal.  ?   Pupils: Pupils are equal, round, and reactive to light.  ?Cardiovascular:  ?   Rate and Rhythm: Normal rate and regular rhythm.  ?   Heart sounds: Normal heart sounds.  ?Pulmonary:  ?   Effort: Pulmonary effort is normal.  ?   Breath sounds: Normal breath sounds.  ?Abdominal:  ?   General: Bowel sounds are normal.  ?   Palpations: Abdomen is soft.  ?Musculoskeletal:  ?   Cervical back: Normal range of motion.  ?Lymphadenopathy:  ?   Cervical: No cervical adenopathy.  ?Skin: ?   General: Skin is warm and dry.  ?   Capillary Refill: Capillary refill takes less than 2 seconds.  ?Neurological:  ?   General: No focal deficit present.  ?   Mental Status: He is alert and oriented to person, place, and time.  ?Psychiatric:     ?   Mood and Affect: Mood normal.     ?   Behavior: Behavior normal.  ? ? ? ?UC Treatments / Results  ?Labs ?(all labs ordered are listed, but only abnormal results are displayed) ?Labs Reviewed - No data to display ? ?EKG ? ? ?Radiology ?No results found. ? ?Procedures ?Procedures (including critical care time) ? ?Medications Ordered in UC ?Medications -  No data to display ? ?Initial Impression / Assessment and Plan / UC Course  ?I  have reviewed the triage vital signs and the nursing notes. ? ?Pertinent labs & imaging results that were available during my care of the patient were reviewed by me and considered in my medical decision making (see chart for details). ? ?The patient is a pleasant 71 year old male who presents with complaints of sore throat.  Symptoms have been present for the past month and have been intermittent.  Patient states symptoms wax and wane, and usually feel worse in the morning and at night.  On exam, there is no indication of a bacterial infection to include no exudate, no fever, and no other systemic symptoms.  Patient's symptoms are consistent with allergic rhinitis, explained to him how drainage will make a sore throat worse.  Patient also has fluid in the left middle ear.  A rapid strep test was not performed based on the patient's presentation and physical exam.  Will prescribe the patient cetirizine to take at bedtime and Flonase to use in the morning.  Continue Advil as needed, and recommended warm salt water gargles.  Patient advised that if he is outside working for long periods of time, he should wear a mask to prevent worsening of symptoms.  Advised to follow-up with PCP if symptoms do not improve. ?Final Clinical Impressions(s) / UC Diagnoses  ? ?Final diagnoses:  ?None  ? ?Discharge Instructions   ?None ?  ? ?ED Prescriptions   ?None ?  ? ?PDMP not reviewed this encounter. ?  ?Tish Men, NP ?05/05/21 1226 ? ?

## 2021-05-05 NOTE — Discharge Instructions (Addendum)
Take medication as prescribed. ?May continue Advil as needed for pain. ?Warm salt water gargles as needed to help with throat discomfort. ?If you are outside for an extended period of time, please wear your mask. ?Follow-up with primary care physician if symptoms do not improve. ?

## 2021-05-20 ENCOUNTER — Other Ambulatory Visit: Payer: Self-pay | Admitting: Internal Medicine

## 2021-05-20 ENCOUNTER — Ambulatory Visit
Admission: RE | Admit: 2021-05-20 | Discharge: 2021-05-20 | Disposition: A | Discharge status: Home / Self Care | Payer: Medicare Other | Source: Ambulatory Visit | Attending: Internal Medicine | Admitting: Internal Medicine

## 2021-05-20 DIAGNOSIS — M25551 Pain in right hip: Secondary | ICD-10-CM

## 2021-05-20 DIAGNOSIS — R52 Pain, unspecified: Secondary | ICD-10-CM

## 2021-06-10 ENCOUNTER — Emergency Department (HOSPITAL_COMMUNITY): Payer: Medicare PPO

## 2021-06-10 ENCOUNTER — Emergency Department (HOSPITAL_COMMUNITY)
Admission: EM | Admit: 2021-06-10 | Discharge: 2021-06-10 | Disposition: A | Discharge status: Home / Self Care | Payer: Medicare PPO | Attending: Emergency Medicine | Admitting: Emergency Medicine

## 2021-06-10 DIAGNOSIS — I10 Essential (primary) hypertension: Secondary | ICD-10-CM | POA: Diagnosis not present

## 2021-06-10 DIAGNOSIS — Z79899 Other long term (current) drug therapy: Secondary | ICD-10-CM | POA: Insufficient documentation

## 2021-06-10 DIAGNOSIS — J029 Acute pharyngitis, unspecified: Secondary | ICD-10-CM

## 2021-06-10 DIAGNOSIS — F17200 Nicotine dependence, unspecified, uncomplicated: Secondary | ICD-10-CM | POA: Diagnosis not present

## 2021-06-10 DIAGNOSIS — Z20822 Contact with and (suspected) exposure to covid-19: Secondary | ICD-10-CM | POA: Insufficient documentation

## 2021-06-10 DIAGNOSIS — R911 Solitary pulmonary nodule: Secondary | ICD-10-CM | POA: Diagnosis not present

## 2021-06-10 DIAGNOSIS — J040 Acute laryngitis: Secondary | ICD-10-CM | POA: Insufficient documentation

## 2021-06-10 LAB — GROUP A STREP BY PCR: Group A Strep by PCR: NOT DETECTED

## 2021-06-10 LAB — RESP PANEL BY RT-PCR (FLU A&B, COVID) ARPGX2
Influenza A by PCR: NEGATIVE
Influenza B by PCR: NEGATIVE
SARS Coronavirus 2 by RT PCR: NEGATIVE

## 2021-06-10 MED ORDER — PREDNISONE 10 MG PO TABS: ORAL_TABLET | ORAL | 0 refills | Status: AC

## 2021-06-10 MED ORDER — NAPROXEN 375 MG PO TABS: 375.0000 mg | ORAL_TABLET | Freq: Two times a day (BID) | ORAL | 0 refills | Status: DC

## 2021-06-10 NOTE — Discharge Instructions (Addendum)
Take the medications as prescribed.  Follow-up with an ENT doctor for further evaluation. ? ?Follow-up with your primary care doctor regarding incidental lung nodule noted to arrange for an outpatient CT scan. ?

## 2021-06-10 NOTE — ED Triage Notes (Signed)
Pt states he has had a sore throat for 2 weeks. States now he has lost his voice and has right ear pain. ?

## 2021-06-10 NOTE — ED Provider Notes (Signed)
?[facility] DEPT ?Provider Note ? ? ?CSN: [id number] ?Arrival date & time: 06/10/21  0906 ? ?  ? ?History ? ?Chief Complaint  ?Patient presents with  ? Sore Throat  ? ? ?Leslie Livingston is a 71 y.o. male. ? ? ?Sore Throat ? ? ?Patient presents to the ED for evaluation of a sore throat.  Patient states he has had symptoms for a couple of weeks now.  He has had earaches, nasal drainage, sore throat.  He has been coughing slightly.  Has not had any fevers.  Is not have any difficulty swallowing or speaking.  Patient states he went to an urgent care and was told it could be allergies.  Does not feel like he is got any better.  His voice is hoarse now.  Patient does have a history of smoking. ? ?Home Medications ?Prior to Admission medications   ?Medication Sig Start Date End Date Taking? Authorizing Provider  ?amLODipine (NORVASC) 5 MG tablet Take 5 mg by mouth daily.   Yes [provider]  ?cephALEXin (KEFLEX) 250 MG capsule Take 250 mg by mouth 4 (four) times daily. On day 8 06/02/21  Yes [provider]  ?cetirizine (ZYRTEC) 10 MG tablet Take 1 tablet (10 mg total) by mouth daily. 05/05/21  Yes Leath-Warren, Alda Lea, NP  ?diclofenac (VOLTAREN) 75 MG EC tablet Take 75 mg by mouth 2 (two) times daily. 04/14/21  Yes [provider]  ?lisinopril (PRINIVIL,ZESTRIL) 20 MG tablet Take 20 mg by mouth daily. 02/23/18  Yes [provider]  ?naproxen (NAPROSYN) 375 MG tablet Take 1 tablet (375 mg total) by mouth 2 (two) times daily. 06/10/21  Yes Dorie Rank, MD  ?predniSONE (DELTASONE) 10 MG tablet Take 4 tablets (40 mg total) by mouth daily with breakfast for 2 days, THEN 3 tablets (30 mg total) daily with breakfast for 2 days, THEN 2 tablets (20 mg total) daily with breakfast for 2 days, THEN 1 tablet (10 mg total) daily with breakfast for 2 days. 06/10/21 06/18/21 Yes Dorie Rank, MD  ?fluticasone Asencion Islam) 50 MCG/ACT nasal spray Place 2 sprays into both nostrils  daily. ?Patient not taking: Reported on 06/10/2021 05/05/21   Leath-Warren, Alda Lea, NP  ?simvastatin (ZOCOR) 10 MG tablet Take 10 mg by mouth daily. ?Patient not taking: Reported on 06/10/2021 04/27/18   [provider]  ?   ? ?Allergies    ?Patient has no known allergies.   ? ?Review of Systems   ?Review of Systems  ?Constitutional:  Negative for fever.  ? ?Physical Exam ?Updated Vital Signs ?BP (!) 162/65   Pulse 63   Temp 98.1 ?F (36.7 ?C) (Oral)   Resp 17   Ht 1.867 m (6' 1.5")   Wt 102.1 kg   SpO2 97%   BMI 29.28 kg/m?  ?Physical Exam ?Vitals and nursing note reviewed.  ?Constitutional:   ?   General: He is not in acute distress. ?   Appearance: He is well-developed.  ?HENT:  ?   Head: Normocephalic and atraumatic.  ?   Right Ear: Tympanic membrane and external ear normal.  ?   Left Ear: Tympanic membrane and external ear normal.  ?   Mouth/Throat:  ?   Tonsils: No tonsillar exudate or tonsillar abscesses.  ?Eyes:  ?   General: No scleral icterus.    ?   Right eye: No discharge.     ?   Left eye: No discharge.  ?   Conjunctiva/sclera: Conjunctivae normal.  ?  Neck:  ?   Trachea: No tracheal deviation.  ?Cardiovascular:  ?   Rate and Rhythm: Normal rate and regular rhythm.  ?Pulmonary:  ?   Effort: Pulmonary effort is normal. No respiratory distress.  ?   Breath sounds: Normal breath sounds. No stridor.  ?Abdominal:  ?   General: There is no distension.  ?Musculoskeletal:     ?   General: No swelling or deformity.  ?   Cervical back: Neck supple.  ?Skin: ?   General: Skin is warm and dry.  ?   Findings: No rash.  ?Neurological:  ?   Mental Status: He is alert.  ?   Cranial Nerves: Cranial nerve deficit: no gross deficits.  ? ? ?ED Results / Procedures / Treatments   ?Labs ?(all labs ordered are listed, but only abnormal results are displayed) ?Labs Reviewed  ?GROUP A STREP BY PCR  ?RESP PANEL BY RT-PCR (FLU A&B, COVID) [id number]  ? ? ?EKG ?None ? ?Radiology ?DG Chest 2 View ? ?Result Date:  06/10/2021 ?CLINICAL DATA:  Sore throat for 2 weeks, lost voice, right ear pain EXAM: CHEST - 2 VIEW COMPARISON:  Chest radiograph 08/18/2015 FINDINGS: The cardiomediastinal silhouette is stable and within normal limits There is no focal consolidation or pulmonary edema. There is no pleural effusion or pneumothorax. There is a 1.2 cm nodular opacity projecting over the right midlung not seen on the prior study. There is no acute osseous abnormality. IMPRESSION: 1. 1.2 cm nodular opacity projecting over the right midlung not seen on the prior study from 2017. Recommend nonemergent CT chest for further evaluation. 2. Otherwise, no radiographic evidence of acute cardiopulmonary process. Electronically Signed   By: Valetta Mole M.D.   On: 06/10/2021 11:29   ? ?Procedures ?Procedures  ? ? ?Medications Ordered in ED ?Medications - No data to display ? ?ED Course/ Medical Decision Making/ A&P ?Clinical Course as of 06/10/21 1328  ?Wed Jun 10, 2021  ?1109 Resp Panel by RT-PCR (Flu A&B, Covid) Nasopharyngeal Swab ?Negative [JK]  ?1109 Group A Strep by PCR ?Negative [JK]  ?1303 Chest x-ray images and radiology report reviewed.  Pulmonary nodule noted.  Otherwise no acute findings [JK]  ?  ?Clinical Course User Index ?[JK] Dorie Rank, MD  ? ?                        ?Medical Decision Making ?DDX includes but not limited to streptococcal pharyngitis, viral pharyngitis, allergic rhinosinusitis with postnasal drainage, less likely laryngeal mass.  Patient is not having any stridor.  No breathing difficulty ? ?Problems Addressed: ?Hypertension, unspecified type: chronic illness or injury ?Laryngitis: complicated acute illness or injury ?Lung nodule: undiagnosed new problem with uncertain prognosis ?Sore throat: complicated acute illness or injury ? ?Amount and/or Complexity of Data Reviewed ?Labs:  Decision-making details documented in ED Course. ?Radiology: ordered and independent interpretation performed. ? ?Risk ?Prescription  drug management. ? ? ?Patient presented to the ED for evaluation of sore throat.  ED work-up overall reassuring.  No signs of strep throat.  COVID and flu are negative.  Patient does not have any difficulty with his secretions.  Doubt abscess.  No stridor or difficulty breathing.  Patient does have risk factors but at this time no findings to suggest malignancy.  We will try him on a course of steroids to see if this helps with his symptoms.  Otherwise we will have him follow-up with ENT to consider further evaluation such as  laryngoscopy if symptoms persist.  Incidental lung nodule also noted.  Discussed this findings with patient and recommendation for outpatient CT scan. ? ? ? ? ? ? ? ?Final Clinical Impression(s) / ED Diagnoses ?Final diagnoses:  ?Sore throat  ?Laryngitis  ?Lung nodule  ?Hypertension, unspecified type  ? ? ?Rx / DC Orders ?ED Discharge Orders   ? ?      Ordered  ?  naproxen (NAPROSYN) 375 MG tablet  2 times daily       ? 06/10/21 1325  ?  predniSONE (DELTASONE) 10 MG tablet       ? 06/10/21 1325  ? ?  ?  ? ?  ? ? ?  ?Dorie Rank, MD ?06/10/21 1328 ? ?

## 2021-06-25 ENCOUNTER — Emergency Department (HOSPITAL_COMMUNITY)
Admission: EM | Admit: 2021-06-25 | Discharge: 2021-06-25 | Disposition: A | Discharge status: Home / Self Care | Payer: Medicare PPO | Attending: Emergency Medicine | Admitting: Emergency Medicine

## 2021-06-25 ENCOUNTER — Encounter (HOSPITAL_COMMUNITY): Payer: Self-pay | Admitting: Emergency Medicine

## 2021-06-25 ENCOUNTER — Other Ambulatory Visit: Payer: Self-pay

## 2021-06-25 DIAGNOSIS — H9201 Otalgia, right ear: Secondary | ICD-10-CM | POA: Insufficient documentation

## 2021-06-25 DIAGNOSIS — J029 Acute pharyngitis, unspecified: Secondary | ICD-10-CM | POA: Insufficient documentation

## 2021-06-25 DIAGNOSIS — R059 Cough, unspecified: Secondary | ICD-10-CM | POA: Insufficient documentation

## 2021-06-25 MED ORDER — LORATADINE 10 MG PO TABS: 10.0000 mg | ORAL_TABLET | Freq: Every day | ORAL | 0 refills | Status: DC

## 2021-06-25 MED ORDER — NAPROXEN 375 MG PO TABS: 375.0000 mg | ORAL_TABLET | Freq: Two times a day (BID) | ORAL | 0 refills | Status: DC

## 2021-06-25 MED ORDER — FLUTICASONE PROPIONATE 50 MCG/ACT NA SUSP: 2.0000 | Freq: Every day | NASAL | 0 refills | Status: DC

## 2021-06-25 NOTE — ED Provider Notes (Signed)
[facility] DEPT Provider Note   CSN: [id number] Arrival date & time: 06/25/21  1113     History  Chief Complaint  Patient presents with   Otalgia   Sore Throat    RHET RORKE is a 71 y.o. male.  HPI  Patient is a 71 year old male who presents to the emergency department for evaluation of throat and ear pain.  Previously seen for this on May 17 and had a negative respiratory panel as well as strep test at that time.  He was discharged on naproxen as well as prednisone.  Also notes being seen at [facility] and by his PCP. Lastly, says he was seen by his dentist but no source of pain was identified.  States that the naproxen as well as the prednisone improved his symptoms for short period of time but since completing the medications his symptoms have returned.  States that he has not followed up with ENT as of yet.  Reports a mild intermittent cough.  No chest pain, shortness of breath, nausea, vomiting, diarrhea.     Home Medications Prior to Admission medications   Medication Sig Start Date End Date Taking? Authorizing Provider  loratadine (CLARITIN) 10 MG tablet Take 1 tablet (10 mg total) by mouth daily. 06/25/21  Yes Rayna Sexton, PA-C  amLODipine (NORVASC) 5 MG tablet Take 5 mg by mouth daily.    [provider]  cephALEXin (KEFLEX) 250 MG capsule Take 250 mg by mouth 4 (four) times daily. On day 8 06/02/21   [provider]  fluticasone (FLONASE) 50 MCG/ACT nasal spray Place 2 sprays into both nostrils daily. 06/25/21   Rayna Sexton, PA-C  lisinopril (PRINIVIL,ZESTRIL) 20 MG tablet Take 20 mg by mouth daily. 02/23/18   [provider]  naproxen (NAPROSYN) 375 MG tablet Take 1 tablet (375 mg total) by mouth 2 (two) times daily with a meal. 06/25/21   Rayna Sexton, PA-C  simvastatin (ZOCOR) 10 MG tablet Take 10 mg by mouth daily. Patient not taking: Reported on 06/10/2021 04/27/18   [provider]      Allergies     Patient has no known allergies.    Review of Systems   Review of Systems  Constitutional:  Negative for fever.  HENT:  Positive for congestion, ear pain and sore throat. Negative for ear discharge.   Respiratory:  Positive for cough. Negative for shortness of breath.   Cardiovascular:  Negative for chest pain.  Gastrointestinal:  Negative for diarrhea, nausea and vomiting.   Physical Exam Updated Vital Signs BP (!) 149/96   Pulse (!) 56   Temp 98.3 F (36.8 C) (Oral)   Resp 16   SpO2 97%  Physical Exam Vitals and nursing note reviewed.  Constitutional:      General: He is not in acute distress.    Appearance: Normal appearance. He is not ill-appearing, toxic-appearing or diaphoretic.  HENT:     Head: Normocephalic and atraumatic.     Right Ear: Ear canal and external ear normal. No drainage, swelling or tenderness. A middle ear effusion is present. Tympanic membrane is not erythematous.     Left Ear: Tympanic membrane, ear canal and external ear normal. No drainage, swelling or tenderness.  No middle ear effusion. Tympanic membrane is not erythematous.     Nose: Nose normal. No rhinorrhea.     Mouth/Throat:     Mouth: Mucous membranes are moist. No oral lesions.     Pharynx: Oropharynx is clear. No  pharyngeal swelling, oropharyngeal exudate, posterior oropharyngeal erythema or uvula swelling.     Tonsils: No tonsillar exudate or tonsillar abscesses. 0 on the right. 0 on the left.     Comments: Uvula midline.  No erythema noted in the posterior oropharynx.  No exudates.  Readily handling secretions.  No hot potato voice.  No cervical lymphadenopathy.  No stridor. Eyes:     Extraocular Movements: Extraocular movements intact.  Cardiovascular:     Rate and Rhythm: Normal rate and regular rhythm.     Pulses: Normal pulses.     Heart sounds: Normal heart sounds. No murmur heard.   No friction rub. No gallop.  Pulmonary:     Effort: Pulmonary effort is normal. No respiratory  distress.     Breath sounds: Normal breath sounds. No stridor. No wheezing, rhonchi or rales.  Abdominal:     General: Abdomen is flat.     Palpations: Abdomen is soft.     Tenderness: There is no abdominal tenderness.  Musculoskeletal:        General: Normal range of motion.     Cervical back: Normal range of motion and neck supple. No tenderness.  Skin:    General: Skin is warm and dry.  Neurological:     General: No focal deficit present.     Mental Status: He is alert and oriented to person, place, and time.  Psychiatric:        Mood and Affect: Mood normal.        Behavior: Behavior normal.   ED Results / Procedures / Treatments   Labs (all labs ordered are listed, but only abnormal results are displayed) Labs Reviewed - No data to display  EKG None  Radiology No results found.  Procedures Procedures   Medications Ordered in ED Medications - No data to display  ED Course/ Medical Decision Making/ A&P                           Medical Decision Making Risk OTC drugs. Prescription drug management.  Patient is a 71 year old male who presents to the emergency department for evaluation of right ear pain and sore throat.  Symptoms have been ongoing for many weeks and have been waxing and waning.  Last seen in the emergency department on May 17 and was prescribed prednisone as well as naproxen which he states he completed with mild short-term relief but since completing the medication his symptoms have returned.  At his previous visit he was tested for COVID-19, flu, and strep and all tests were reassuring.  On my exam patient does have a middle ear effusion on the right but right ear and EAC appears normal.  TMs nonbulging and nonerythematous.  Uvula is midline.  Does not appear consistent with otitis media or externa.  No erythema or exudates noted in the posterior oropharynx.  Soft compartments.  No cervical lymphadenopathy.  Readily handling secretions.  No stridor.   Doubt PTA or Ludwig's angina at this time. LCTAB. RRR without M/R/G.  Given patient's reassuring PE findings as well as his reassuring lab work 2 weeks ago, will not retest for strep, flu and COVID.  Will discharge on a course of Claritin, Flonase, as well as naproxen.  Recommended PCP follow-up.  Patient was unaware that he was referred to ENT so he was given a new referral to ENT at this visit as well.  Discussed return precautions.  He appears stable for discharge at  this time and is agreeable.  His questions were answered and he was amicable at the time of discharge.  Final Clinical Impression(s) / ED Diagnoses Final diagnoses:  Right ear pain  Sore throat   Rx / DC Orders ED Discharge Orders          Ordered    naproxen (NAPROSYN) 375 MG tablet  2 times daily with meals        06/25/21 1242    fluticasone (FLONASE) 50 MCG/ACT nasal spray  Daily        06/25/21 1242    loratadine (CLARITIN) 10 MG tablet  Daily        06/25/21 1242              Rayna Sexton, PA-C 06/25/21 1252    Charlesetta Shanks, MD 06/26/21 1145

## 2021-06-25 NOTE — Discharge Instructions (Signed)
I am prescribing you 3 medications.  I am prescribing you an anti-inflammatory medication called naproxen.  This is similar to Aleve.  You can take this up to 2 times a day for management of your pain.  Try to take it with a small amount of food to help prevent stomach irritation.  I am also prescribing you an allergy medication called Claritin.  Please take this once per day to see if this helps improve your sore throat, cough, ear pain, and congestion.  Lastly, I am prescribing you Flonase.  Please spray this in each nostril twice per day.  This should also help with your symptoms.  Please follow-up with your primary care provider regarding your symptoms as well as this visit today.  If you develop any new or worsening symptoms please come back to the emergency department immediately.  Below is the contact information for [facility].  Please give them a call at your convenience to schedule an appointment for reevaluation.

## 2021-06-25 NOTE — ED Triage Notes (Signed)
Pt reports earache and sore throat x 2-3 weeks. Reports was seen a week ago for same and meds have ran out and symptoms are back.

## 2021-07-15 ENCOUNTER — Other Ambulatory Visit: Payer: Self-pay | Admitting: Physician Assistant

## 2021-07-15 DIAGNOSIS — J3800 Paralysis of vocal cords and larynx, unspecified: Secondary | ICD-10-CM

## 2021-07-15 DIAGNOSIS — R490 Dysphonia: Secondary | ICD-10-CM

## 2021-07-15 DIAGNOSIS — R070 Pain in throat: Secondary | ICD-10-CM

## 2021-07-16 ENCOUNTER — Other Ambulatory Visit: Payer: Self-pay | Admitting: Physician Assistant

## 2021-07-16 DIAGNOSIS — R070 Pain in throat: Secondary | ICD-10-CM

## 2021-07-16 DIAGNOSIS — R918 Other nonspecific abnormal finding of lung field: Secondary | ICD-10-CM

## 2021-07-16 DIAGNOSIS — R490 Dysphonia: Secondary | ICD-10-CM

## 2021-07-16 DIAGNOSIS — J3800 Paralysis of vocal cords and larynx, unspecified: Secondary | ICD-10-CM

## 2021-07-21 ENCOUNTER — Encounter: Payer: Self-pay | Admitting: Pulmonary Disease

## 2021-07-21 ENCOUNTER — Ambulatory Visit: Payer: Medicare PPO | Admitting: Pulmonary Disease

## 2021-07-21 VITALS — BP 110/64 | HR 72 | Temp 98.1°F | Ht 73.0 in | Wt 212.8 lb

## 2021-07-21 DIAGNOSIS — R9389 Abnormal findings on diagnostic imaging of other specified body structures: Secondary | ICD-10-CM

## 2021-07-21 NOTE — Progress Notes (Addendum)
Leslie Livingston    [id number]    09/28/1950  Primary Care Physician:Roberts, Jori Moll, MD  Referring Physician: Shon Hale, [place],  [place] [id number]  Chief complaint: Consult for abnormal chest x-ray  HPI:   71 y.o. smoker with with history of hypertension, hyperlipidemia referred for evaluation of abnormal chest x-ray.  His chief complaint is 28-monthhistory of throat discomfort, difficulty swallowing and hoarseness.  He has had 2 ED visits for similar complaints.  He has been referred to ENT by his primary care.  He had an ENT evaluation on laryngoscopy findings showing decreased mobility of right vocal cord.  He has [facility] therapy [facility]agia [facility].  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  No feather pillows or comforters Smoking history: 50-pack-year smoker Travel history: No significant travel history Relevant family history: No family history of lung disease   Outpatient Encounter Medications as of 07/21/2021  Medication Sig   amLODipine (NORVASC) 5 MG tablet Take 5 mg by mouth daily.   lisinopril (PRINIVIL,ZESTRIL) 20 MG tablet Take 20 mg by mouth daily.   loratadine (CLARITIN) 10 MG tablet Take 1 tablet (10 mg total) by mouth daily.   naproxen (NAPROSYN) 375 MG tablet Take 1 tablet (375 mg total) by mouth 2 (two) times daily with a meal.   [DISCONTINUED] cephALEXin (KEFLEX) 250 MG capsule Take 250 mg by mouth 4 (four) times daily. On day 8 (Patient not taking: Reported on 07/21/2021)   [DISCONTINUED] fluticasone (FLONASE) 50 MCG/ACT nasal spray Place 2 sprays into both nostrils daily. (Patient not taking: Reported on 07/21/2021)   [DISCONTINUED] simvastatin (ZOCOR) 10 MG tablet Take 10 mg by mouth daily. (Patient not taking: Reported on 06/10/2021)   No  facility-administered encounter medications on file as of 07/21/2021.    Allergies as of 07/21/2021   (No Known Allergies)    Past Medical History:  Diagnosis Date   Allergy    Arthritis    Hyperlipidemia    Hypertension     Past Surgical History:  Procedure Laterality Date   COLONOSCOPY WITH PROPOFOL N/A 05/05/2018   Procedure: COLONOSCOPY WITH PROPOFOL;  Surgeon: PIrene Shipper MD;  Location: [facility];  Service: Endoscopy;  Laterality: N/A;    Family History  Problem Relation Age of Onset   Colon cancer Neg Hx    Stomach cancer Neg Hx    Esophageal cancer Neg Hx    Rectal cancer Neg Hx     Social History   Socioeconomic History   Marital status: Single    Spouse name: Not on file   Number of children: Not on file   Years of education: Not on file   Highest education level: Not on file  Occupational History   Not on file  Tobacco Use   Smoking status: Former    Packs/day: 0.25    Types: Cigarettes    Quit date: 05/2021    Years since quitting: 0.1    Passive exposure: Past   Smokeless tobacco: Never  Vaping Use   Vaping Use: Never used  Substance and Sexual Activity   Alcohol use: Yes    Alcohol/week: 6.0 standard drinks of alcohol    Types: 6 Cans of beer per week   Drug  use: No   Sexual activity: Not on file  Other Topics Concern   Not on file  Social History Narrative   Not on file   Social Determinants of Health   Financial Resource Strain: Not on file  Food Insecurity: Not on file  Transportation Needs: Not on file  Physical Activity: Not on file  Stress: Not on file  Social Connections: Not on file  Intimate Partner Violence: Not on file    Review of systems: Review of Systems  Constitutional: Negative for fever and chills.  HENT: Negative.   Eyes: Negative for blurred vision.  Respiratory: as per HPI  Cardiovascular: Negative for chest pain and palpitations.  Gastrointestinal: Negative for vomiting, diarrhea, blood per  rectum. Genitourinary: Negative for dysuria, urgency, frequency and hematuria.  Musculoskeletal: Negative for myalgias, back pain and joint pain.  Skin: Negative for itching and rash.  Neurological: Negative for dizziness, tremors, focal weakness, seizures and loss of consciousness.  Endo/Heme/Allergies: Negative for environmental allergies.  Psychiatric/Behavioral: Negative for depression, suicidal ideas and hallucinations.  All other systems reviewed and are negative.  Physical Exam: Blood pressure 110/64, pulse 72, temperature 98.1 F (36.7 C), temperature source Oral, height '6\' 1"'$  (1.854 m), weight 212 lb 12.8 oz (96.5 kg). Gen:      No acute distress HEENT:  EOMI, sclera anicteric Neck:     No masses; no thyromegaly Lungs:    Clear to auscultation bilaterally; normal respiratory effort CV:         Regular rate and rhythm; no murmurs Abd:      + bowel sounds; soft, non-tender; no palpable masses, no distension Ext:    No edema; adequate peripheral perfusion Skin:      Warm and dry; no rash Neuro: alert and oriented x 3 Psych: normal mood and affect  Data Reviewed: Imaging: Chest x-ray 06/10/2021- 1.2 cm nodular opacity projecting over the right midlung.  I have reviewed the images personally.  PFTs:  Labs:  Assessment:  Abnormal chest x-ray This x-ray shows a nodular opacity of the right midlung which needs further follow-up and rule out for malignancy in smoker He already has a CT chest ordered last week along with CT neck by his ENT doctor.  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  He has been referred to speech therapy.  Plan/Recommendations: Follow results of CT chest, CT neck  Marshell Garfinkel MD [facility] 07/21/2021, 10:06 AM  CC: Spainhour, Camelia Eng, PA  Addendum Patient got a CT chest followed by CT chest abdomen pelvis  on 08/07/2021 [images in PACS] showing widely metastatic esophageal cancer with pulmonary nodules and supraclavicular lymph nodes.  He has already been referred to GI and oncology.  Marshell Garfinkel MD [facility] 08/13/2021, 4:32 PM

## 2021-07-22 ENCOUNTER — Ambulatory Visit: Payer: Medicare PPO | Attending: Physician Assistant | Admitting: Speech Pathology

## 2021-07-22 DIAGNOSIS — R1310 Dysphagia, unspecified: Secondary | ICD-10-CM | POA: Insufficient documentation

## 2021-07-22 DIAGNOSIS — R490 Dysphonia: Secondary | ICD-10-CM | POA: Insufficient documentation

## 2021-07-23 ENCOUNTER — Ambulatory Visit: Payer: Medicare PPO | Admitting: Speech Pathology

## 2021-07-23 DIAGNOSIS — R490 Dysphonia: Secondary | ICD-10-CM

## 2021-07-23 DIAGNOSIS — R1310 Dysphagia, unspecified: Secondary | ICD-10-CM

## 2021-07-23 NOTE — Therapy (Addendum)
OUTPATIENT SPEECH LANGUAGE PATHOLOGY EVALUATION   Patient Name: Leslie Livingston MRN: [id number] DOB:[birth year], 71 y.o., male Today's Date: 07/24/2021  PCP: Lorene Dy, MD REFERRING PROVIDER: [place] Camelia Eng, PA   End of Session - 07/24/21 0901     Visit Number 1    Number of Visits 7    Date for SLP Re-Evaluation 09/04/21    Authorization Type [employer]    Progress Note Due on Visit 10    SLP Start Time 1404    SLP Stop Time  1445    SLP Time Calculation (min) 41 min    Activity Tolerance Patient tolerated treatment well             Past Medical History:  Diagnosis Date   Allergy    Arthritis    Hyperlipidemia    Hypertension    Past Surgical History:  Procedure Laterality Date   COLONOSCOPY WITH PROPOFOL N/A 05/05/2018   Procedure: COLONOSCOPY WITH PROPOFOL;  Surgeon: Irene Shipper, MD;  Location: [facility];  Service: Endoscopy;  Laterality: N/A;   Patient Active Problem List   Diagnosis Date Noted   Iron deficiency anemia due to chronic blood loss 05/05/2018   Dyslipidemia 05/05/2018   Obesity (BMI 30-39.9) 05/05/2018   GERD (gastroesophageal reflux disease) 05/05/2018   Acute blood loss anemia 05/05/2018   Diverticulosis of colon with hemorrhage    Rectal bleed 05/04/2018   GI bleed 05/02/2018   ERECTILE DYSFUNCTION 06/06/2007   ANEMIA-NOS 12/16/2006   Essential hypertension 12/16/2006   HEMORRHOIDS, EXTERNAL 12/16/2006    ONSET DATE: April 2023   REFERRING DIAG:  R13.10 (ICD-10-CM) - Dysphagia, unspecified  R49.0 (ICD-10-CM) - Dysphonia    THERAPY DIAG:  No diagnosis found.  Rationale for Evaluation and Treatment Rehabilitation  SUBJECTIVE:   SUBJECTIVE STATEMENT: "I'm getting sicker and sicker" Pt accompanied by: self  PERTINENT HISTORY: 53-monthhistory of throat discomfort and some right ear pain. He feels like it is progressively worsened and within the past couple of weeks has noticed more difficulty with swallowing  and some hoarseness. He is a former smoker. He has had 2 ED visits for throat pain. At 1 of those visits a chest x-ray was taken which showed a right midlung 1.2 cm nodular opacity. Follow-up CT chest was recommended.  PAIN:  Are you having pain? Yes: NPRS scale: 8/10 Pain location: throat Pain description: soreness Aggravating factors: none Relieving factors: advil    FALLS: Has patient fallen in last 6 months?  No  LIVING ENVIRONMENT: Lives with: lives alone Lives in: House/apartment  PLOF:  Level of assistance: Independent with IADLs Employment: Retired   PATIENT GOALS "just help me"  OBJECTIVE:   DIAGNOSTIC FINDINGS: 07/14/2021 Flexible laryngoscopy shows patent anterior nasal cavity with minimal crusting, no discharge or infection.  Normal base of tongue and supraglottis. 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 Visualization was somewhat limited due to poor patient cooperation. Hypopharynx normal without mass, pooling of secretions or aspiration.  COGNITION: Overall cognitive status: Within functional limits for tasks assessed  ORAL MOTOR EXAMINATION Overall status: WFL Comments: unremarkable  CLINICAL SWALLOW ASSESSMENT:   Current diet: regular, thin liquids, and current diet modifications: avoiding most solids, is able to pass soft foods  Dentition: adequate natural dentition Patient directly observed with POs: Yes: regular, dysphagia 3 (soft), dysphagia 1 (puree), and thin liquids  Feeding: able to feed self Liquids provided by: cup, including sequential swallow trials Oral phase signs and symptoms:  none evidenced this date Pharyngeal phase signs and symptoms: multiple swallows, audible swallow, immediate throat clear, delayed throat clear, and delayed cough (pt reports audible swallow at baseline; throat clears also observed at baseline prior to bolus administrations) Comments: c/o odynophagia  SOCIAL HISTORY: Occupation: retired  Building control surveyor  intake: suboptimal Caffeine/alcohol intake: denies Daily voice use: moderate  PERCEPTUAL VOICE ASSESSMENT: Voice quality: hoarse, harsh, rough, vocal fatigue, and aphonic Vocal abuse: habitual throat clearing and habitual loudness Resonance: largely pharyngeal in locus Respiratory function: thoracic breathing  OBJECTIVE VOICE ASSESSMENT: Maximum phonation time for sustained "ah": 5 seconds Conversational pitch average: 113 Hz Conversational pitch range: 82-153 Hz Conversational loudness average: 79 dB Conversational loudness range: 74-86 dB S/z ratio: unable to assess (Suggestive of dysfunction >1.0)   PATIENT REPORTED OUTCOME MEASURES (PROM): V-RQOL: to be administered first session  TODAY'S TREATMENT:  Education on mechanics of voice and swallowing, to include introduction to anatomy and physiology of the larynx. Education on how larynx is used in swallow for airway protection and for creating voice.   SLP provides patient with education regarding additional imaging available (not at this clinic) to provide additional insight into larynx function (video stroboscopy). Discussion on how that information can inform decision making re: best therapy course to address presenting concerns. Education on MBSS indications and purpose. SLP does not believe MBSS is indicated at this time d/t results of CSE. Pt verbalizes he does not wish to have either further evaluation at this time.   Educated on SLP observations and evaluation results, POC, goals. SLP expresses to patient that [place] is being formulated based on today's evaluation results and imaging report available from referring provider. 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   During trial of resonant voice techniques, pt able to move locus of resonance from throat to oral cavity with min-A. Verbalizes feeling  vibrations in front of mouth, decreased tension in throat.   PATIENT EDUCATION: Education details: see above Person educated: Patient Education method: Customer service manager Education comprehension: verbalized understanding, returned demonstration, and needs further education  GOALS: Goals reviewed with patient? Yes  SHORT TERM GOALS: Target date: 08/20/2021  The patient will teach back laryngeal hygiene recommendations, IND, with 100% accuracy.  Baseline: Goal status: INITIAL  2.  The patient will discriminate breath holding versus breath flow in structured speech tasks in 80% of trials with occasional min-A over 2 sessions.  Baseline:  Goal status: INITIAL  3.  The patient will demonstrate resonant voice with open throat phonation in structured speech tasks in 80% of trials a with occasional min-A over 2 sessions.  Baseline:  Goal status: INITIAL  4.  The patient will report completion of voice HEP at least 1x per day (rec BID) with mod-I, over 1 week period.  Baseline:  Goal status: INITIAL  5.  The patient will teach back aspiration precautions and swallow compensations, IND, with 100% accuracy.  Baseline:  Goal status: INITIAL  5.  The patient will participate in MBSS, if indicated over therapy course.  Baseline: not indicated during CSE; SLP to monitor and will recommend if indicated Goal status: INITIAL   LONG TERM GOALS: Target date: 09/04/2021  The patient will report daily compliance with laryngeal hygiene recommendations <1 week period.  Baseline:  Goal status: INITIAL  2.  The patient will identify laryngeal hyperfunction when occurring in spontaneous speech, during 10+ minute conversation,  in 80% of opportunities, with rare min-A.  Baseline:  Goal status: INITIAL  3.  The patient will utilize resonant voice during spontaneous speech in 10 minute conversation, given no more than 5 verbal cues from SLP.   Baseline:  Goal status: INITIAL  4.  The pt  will report subjective reduction in pain in larynx when voicing and swallowing lasting <1 week.  Baseline:  Goal status: INITIAL  ASSESSMENT:  CLINICAL IMPRESSION: Patient is a 71 y.o. M who was seen today for dysphagia and dysphonia. Mr. Hurrell presents c/o pain when swallowing, negatively impacting oral intake, and voice changes characterized by moderate harsh and rough vocal quality, and rare occurences of intermittent aphonia. Reports ongoing for past 3 months, has been severe enough for x3 presentation to ED and f/u with ENT. ENT performed flexible endoscopy which shows unremarkable base of tongue, supraglottis, and hypopharynx. Hypopharynx without mass, pooling of secretions or aspiration. 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 Oral mechanism exam unremarkable. Trial variety of PO boluses, with intermittent throat clears which were also present at baseline. Pt reporting increased pain during the swallow. Despite pain, pt able to clear all trialed boluses of varying consistencies. Regarding voice, pt c/o frequent vocal fatigue, change in voice sound consistent with timeline of dysphagia complaints, and pain increasing with prolonged vocal use. SLP observes pt's voice to have moderate harshness, rare aphonia during sustained phonation, WNL pitch, above average loudness. 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 SLP recommends skilled ST targeting muscle tension dysphonia in effort to reduce pain in throat at rest and during swallow/phonation. Additionally, plan to provide training on swallow compensations and aspiration precautions and monitor dysphagia.   OBJECTIVE IMPAIRMENTS include voice disorder and dysphagia. These impairments are limiting patient from effectively communicating at home and in community and safety when swallowing. Factors affecting potential to achieve goals and functional outcome are  cooperation/participation level. Patient will benefit from skilled SLP services to address above impairments and improve overall function.  REHAB POTENTIAL: Fair    PLAN: SLP FREQUENCY: 1-2x/week; SLP recommends 2x/week, pt declines stating preference for 1x/week at this time.   SLP DURATION: 6 weeks  PLANNED INTERVENTIONS: Aspiration precaution training, Cueing hierachy, Internal/external aids, Functional tasks, SLP instruction and feedback, Compensatory strategies, and Patient/family education  Su Monks, CCC-SLP 07/24/2021, 9:02 AM

## 2021-07-29 ENCOUNTER — Ambulatory Visit
Admission: RE | Admit: 2021-07-29 | Discharge: 2021-07-29 | Disposition: A | Discharge status: Home / Self Care | Payer: Medicare PPO | Source: Ambulatory Visit | Attending: Physician Assistant | Admitting: Physician Assistant

## 2021-07-29 DIAGNOSIS — R918 Other nonspecific abnormal finding of lung field: Secondary | ICD-10-CM

## 2021-07-29 DIAGNOSIS — R070 Pain in throat: Secondary | ICD-10-CM

## 2021-07-29 DIAGNOSIS — J3800 Paralysis of vocal cords and larynx, unspecified: Secondary | ICD-10-CM

## 2021-07-29 DIAGNOSIS — R490 Dysphonia: Secondary | ICD-10-CM

## 2021-07-29 MED ORDER — IOPAMIDOL (ISOVUE-300) INJECTION 61%
75.0000 mL | Freq: Once | INTRAVENOUS | Status: AC | PRN
  Administered 2021-07-29: 75 mL via INTRAVENOUS

## 2021-07-31 ENCOUNTER — Ambulatory Visit: Payer: Medicare PPO | Attending: Physician Assistant | Admitting: Speech Pathology

## 2021-07-31 DIAGNOSIS — R1310 Dysphagia, unspecified: Secondary | ICD-10-CM | POA: Insufficient documentation

## 2021-07-31 DIAGNOSIS — R490 Dysphonia: Secondary | ICD-10-CM | POA: Insufficient documentation

## 2021-07-31 NOTE — Therapy (Deleted)
OUTPATIENT SPEECH LANGUAGE PATHOLOGY TREATMENT NOTE Patient Name: Leslie Livingston MRN: [id number] DOB:[birth year], 71 y.o., male Today's Date: 07/31/2021  PCP: Leslie Dy, MD REFERRING PROVIDER: [place] Camelia Eng, PA     Past Medical History:  Diagnosis Date   Allergy    Arthritis    Hyperlipidemia    Hypertension    Past Surgical History:  Procedure Laterality Date   COLONOSCOPY WITH PROPOFOL N/A 05/05/2018   Procedure: COLONOSCOPY WITH PROPOFOL;  Surgeon: Irene Shipper, MD;  Location: [facility];  Service: Endoscopy;  Laterality: N/A;   Patient Active Problem List   Diagnosis Date Noted   Iron deficiency anemia due to chronic blood loss 05/05/2018   Dyslipidemia 05/05/2018   Obesity (BMI 30-39.9) 05/05/2018   GERD (gastroesophageal reflux disease) 05/05/2018   Acute blood loss anemia 05/05/2018   Diverticulosis of colon with hemorrhage    Rectal bleed 05/04/2018   GI bleed 05/02/2018   ERECTILE DYSFUNCTION 06/06/2007   ANEMIA-NOS 12/16/2006   Essential hypertension 12/16/2006   HEMORRHOIDS, EXTERNAL 12/16/2006    ONSET DATE: April 2023   REFERRING DIAG:  R13.10 (ICD-10-CM) - Dysphagia, unspecified  R49.0 (ICD-10-CM) - Dysphonia    THERAPY DIAG:  Dysphonia  Dysphagia, unspecified type  Rationale for Evaluation and Treatment Rehabilitation  SUBJECTIVE:   SUBJECTIVE STATEMENT: "I'm getting sicker and sicker" Pt accompanied by: self  PAIN:  Are you having pain? Yes: NPRS scale: 8/10 Pain location: throat Pain description: soreness Aggravating factors: none Relieving factors: advil    OBJECTIVE:   TODAY'S TREATMENT:  07-31-21: ***  07-23-21: Education on mechanics of voice and swallowing, to include introduction to anatomy and physiology of the larynx. Education on how larynx is used in swallow for airway protection and for creating voice.   SLP provides patient with education regarding additional imaging available (not at this clinic) to  provide additional insight into larynx function (video stroboscopy). Discussion on how that information can inform decision making re: best therapy course to address presenting concerns. Education on MBSS indications and purpose. SLP does not believe MBSS is indicated at this time d/t results of CSE. Pt verbalizes he does not wish to have either further evaluation at this time.   Educated on SLP observations and evaluation results, POC, goals. SLP expresses to patient that [place] is being formulated based on today's evaluation results and imaging report available from referring provider. 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   During trial of resonant voice techniques, pt able to move locus of resonance from throat to oral cavity with min-A. Verbalizes feeling vibrations in front of mouth, decreased tension in throat.   PATIENT EDUCATION: Education details: see above Person educated: Patient Education method: Customer service manager Education comprehension: verbalized understanding, returned demonstration, and needs further education  GOALS: Goals reviewed with patient? Yes  SHORT TERM GOALS: Target date: 08/20/2021  The patient will teach back laryngeal hygiene recommendations, IND, with 100% accuracy.  Baseline: Goal status: INITIAL  2.  The patient will discriminate breath holding versus breath flow in structured speech tasks in 80% of trials with occasional min-A over 2 sessions.  Baseline:  Goal status: INITIAL  3.  The patient will demonstrate resonant voice with open throat phonation in structured speech tasks in 80% of trials a with occasional min-A over 2 sessions.  Baseline:  Goal status: INITIAL  4.  The patient will report  completion of voice HEP at least 1x per day (rec BID) with mod-I, over 1 week period.  Baseline:  Goal status:  INITIAL  5.  The patient will teach back aspiration precautions and swallow compensations, IND, with 100% accuracy.  Baseline:  Goal status: INITIAL  5.  The patient will participate in MBSS, if indicated over therapy course.  Baseline: not indicated during CSE; SLP to monitor and will recommend if indicated Goal status: INITIAL   LONG TERM GOALS: Target date: 09/04/2021  The patient will report daily compliance with laryngeal hygiene recommendations <1 week period.  Baseline:  Goal status: INITIAL  2.  The patient will identify laryngeal hyperfunction when occurring in spontaneous speech, during 10+ minute conversation, in 80% of opportunities, with rare min-A.  Baseline:  Goal status: INITIAL  3.  The patient will utilize resonant voice during spontaneous speech in 10 minute conversation, given no more than 5 verbal cues from SLP.   Baseline:  Goal status: INITIAL  4.  The pt will report subjective reduction in pain in larynx when voicing and swallowing lasting <1 week.  Baseline:  Goal status: INITIAL  ASSESSMENT:  CLINICAL IMPRESSION: Patient is a 71 y.o. M who was seen today for dysphagia and dysphonia. [name] presents c/o pain when swallowing, negatively impacting oral intake, and voice changes characterized by moderate harsh and rough vocal quality, and rare occurences of intermittent aphonia. Reports ongoing for past 3 months, has been severe enough for x3 presentation to ED and f/u with ENT. ENT performed flexible endoscopy which shows unremarkable base of tongue, supraglottis, and hypopharynx. Hypopharynx without mass, pooling of secretions or aspiration. 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 Oral mechanism exam unremarkable. Trial variety of PO boluses, with intermittent throat clears which were also present at baseline. Pt reporting increased pain during the swallow. Despite pain, pt able to clear all trialed boluses of  varying consistencies. Regarding voice, pt c/o frequent vocal fatigue, change in voice sound consistent with timeline of dysphagia complaints, and pain increasing with prolonged vocal use. SLP observes pt's voice to have moderate harshness, rare aphonia during sustained phonation, WNL pitch, above average loudness. 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 SLP recommends skilled ST targeting muscle tension dysphonia in effort to reduce pain in throat at rest and during swallow/phonation. Additionally, plan to provide training on swallow compensations and aspiration precautions and monitor dysphagia.   OBJECTIVE IMPAIRMENTS include voice disorder and dysphagia. These impairments are limiting patient from effectively communicating at home and in community and safety when swallowing. Factors affecting potential to achieve goals and functional outcome are cooperation/participation level. Patient will benefit from skilled SLP services to address above impairments and improve overall function.  REHAB POTENTIAL: Fair    PLAN: SLP FREQUENCY: 1-2x/week; SLP recommends 2x/week, pt declines stating preference for 1x/week at this time.   SLP DURATION: 6 weeks  PLANNED INTERVENTIONS: Aspiration precaution training, Cueing hierachy, Internal/external aids, Functional tasks, SLP instruction and feedback, Compensatory strategies, and Patient/family education  Su Monks, CCC-SLP 07/31/2021, 7:56 AM

## 2021-08-07 ENCOUNTER — Encounter (HOSPITAL_COMMUNITY)
Admission: EM | Disposition: A | Discharge status: Home Health | Payer: Self-pay | Source: Home / Self Care | Attending: Internal Medicine

## 2021-08-07 ENCOUNTER — Ambulatory Visit: Payer: Medicare PPO | Admitting: Speech Pathology

## 2021-08-07 ENCOUNTER — Observation Stay (HOSPITAL_COMMUNITY): Payer: Medicare PPO | Admitting: Certified Registered Nurse Anesthetist

## 2021-08-07 ENCOUNTER — Other Ambulatory Visit: Payer: Self-pay

## 2021-08-07 ENCOUNTER — Inpatient Hospital Stay (HOSPITAL_COMMUNITY)
Admission: EM | Admit: 2021-08-07 | Discharge: 2021-08-16 | DRG: 357 | Disposition: A | Discharge status: Home Health | Payer: Medicare PPO | Attending: Internal Medicine | Admitting: Internal Medicine

## 2021-08-07 ENCOUNTER — Encounter (HOSPITAL_COMMUNITY): Payer: Self-pay

## 2021-08-07 ENCOUNTER — Observation Stay (HOSPITAL_COMMUNITY): Payer: Medicare PPO

## 2021-08-07 DIAGNOSIS — C7801 Secondary malignant neoplasm of right lung: Secondary | ICD-10-CM | POA: Diagnosis present

## 2021-08-07 DIAGNOSIS — R1310 Dysphagia, unspecified: Secondary | ICD-10-CM | POA: Diagnosis not present

## 2021-08-07 DIAGNOSIS — Z87891 Personal history of nicotine dependence: Secondary | ICD-10-CM | POA: Diagnosis not present

## 2021-08-07 DIAGNOSIS — C159 Malignant neoplasm of esophagus, unspecified: Secondary | ICD-10-CM

## 2021-08-07 DIAGNOSIS — R918 Other nonspecific abnormal finding of lung field: Secondary | ICD-10-CM

## 2021-08-07 DIAGNOSIS — I700 Atherosclerosis of aorta: Secondary | ICD-10-CM | POA: Diagnosis present

## 2021-08-07 DIAGNOSIS — D490 Neoplasm of unspecified behavior of digestive system: Secondary | ICD-10-CM | POA: Diagnosis not present

## 2021-08-07 DIAGNOSIS — D75839 Thrombocytosis, unspecified: Secondary | ICD-10-CM | POA: Diagnosis present

## 2021-08-07 DIAGNOSIS — R634 Abnormal weight loss: Secondary | ICD-10-CM | POA: Diagnosis not present

## 2021-08-07 DIAGNOSIS — M1990 Unspecified osteoarthritis, unspecified site: Secondary | ICD-10-CM | POA: Diagnosis not present

## 2021-08-07 DIAGNOSIS — Z79899 Other long term (current) drug therapy: Secondary | ICD-10-CM

## 2021-08-07 DIAGNOSIS — I10 Essential (primary) hypertension: Secondary | ICD-10-CM

## 2021-08-07 DIAGNOSIS — C7802 Secondary malignant neoplasm of left lung: Secondary | ICD-10-CM | POA: Diagnosis present

## 2021-08-07 DIAGNOSIS — J3801 Paralysis of vocal cords and larynx, unilateral: Secondary | ICD-10-CM | POA: Diagnosis present

## 2021-08-07 DIAGNOSIS — Z6826 Body mass index (BMI) 26.0-26.9, adult: Secondary | ICD-10-CM

## 2021-08-07 DIAGNOSIS — I2510 Atherosclerotic heart disease of native coronary artery without angina pectoris: Secondary | ICD-10-CM | POA: Diagnosis present

## 2021-08-07 DIAGNOSIS — R633 Feeding difficulties, unspecified: Secondary | ICD-10-CM | POA: Diagnosis present

## 2021-08-07 DIAGNOSIS — C153 Malignant neoplasm of upper third of esophagus: Principal | ICD-10-CM | POA: Diagnosis present

## 2021-08-07 DIAGNOSIS — C771 Secondary and unspecified malignant neoplasm of intrathoracic lymph nodes: Secondary | ICD-10-CM | POA: Diagnosis present

## 2021-08-07 DIAGNOSIS — E440 Moderate protein-calorie malnutrition: Secondary | ICD-10-CM | POA: Diagnosis present

## 2021-08-07 DIAGNOSIS — E785 Hyperlipidemia, unspecified: Secondary | ICD-10-CM | POA: Diagnosis present

## 2021-08-07 DIAGNOSIS — D500 Iron deficiency anemia secondary to blood loss (chronic): Secondary | ICD-10-CM | POA: Diagnosis present

## 2021-08-07 DIAGNOSIS — K2289 Other specified disease of esophagus: Secondary | ICD-10-CM

## 2021-08-07 DIAGNOSIS — K219 Gastro-esophageal reflux disease without esophagitis: Secondary | ICD-10-CM | POA: Diagnosis present

## 2021-08-07 HISTORY — PX: ESOPHAGOGASTRODUODENOSCOPY (EGD) WITH PROPOFOL: SHX5813

## 2021-08-07 HISTORY — PX: BIOPSY: SHX5522

## 2021-08-07 LAB — COMPREHENSIVE METABOLIC PANEL
ALT: 12 U/L (ref 0–44)
AST: 12 U/L — ABNORMAL LOW (ref 15–41)
Albumin: 3.6 g/dL (ref 3.5–5.0)
Alkaline Phosphatase: 74 U/L (ref 38–126)
Anion gap: 10 (ref 5–15)
BUN: 18 mg/dL (ref 8–23)
CO2: 24 mmol/L (ref 22–32)
Calcium: 9.7 mg/dL (ref 8.9–10.3)
Chloride: 107 mmol/L (ref 98–111)
Creatinine, Ser: 1.07 mg/dL (ref 0.61–1.24)
GFR, Estimated: >60 mL/min (ref >60)
Glucose, Bld: 105 mg/dL — ABNORMAL HIGH (ref 70–99)
Potassium: 3.8 mmol/L (ref 3.5–5.1)
Sodium: 141 mmol/L (ref 135–145)
Total Bilirubin: 0.6 mg/dL (ref 0.3–1.2)
Total Protein: 8.1 g/dL (ref 6.5–8.1)

## 2021-08-07 LAB — CBC WITH DIFFERENTIAL/PLATELET
Abs Immature Granulocytes: 0.02 10*3/uL (ref 0.00–0.07)
Basophils Absolute: 0 10*3/uL (ref 0.0–0.1)
Basophils Relative: 1 %
Eosinophils Absolute: 0.2 10*3/uL (ref 0.0–0.5)
Eosinophils Relative: 3 %
HCT: 33.6 % — ABNORMAL LOW (ref 39.0–52.0)
Hemoglobin: 10.7 g/dL — ABNORMAL LOW (ref 13.0–17.0)
Immature Granulocytes: 0 %
Lymphocytes Relative: 29 %
Lymphs Abs: 2.1 10*3/uL (ref 0.7–4.0)
MCH: 28.4 pg (ref 26.0–34.0)
MCHC: 31.8 g/dL (ref 30.0–36.0)
MCV: 89.1 fL (ref 80.0–100.0)
Monocytes Absolute: 0.7 10*3/uL (ref 0.1–1.0)
Monocytes Relative: 10 %
Neutro Abs: 4.2 10*3/uL (ref 1.7–7.7)
Neutrophils Relative %: 57 %
Platelets: 417 10*3/uL — ABNORMAL HIGH (ref 150–400)
RBC: 3.77 MIL/uL — ABNORMAL LOW (ref 4.22–5.81)
RDW: 12.2 % (ref 11.5–15.5)
WBC: 7.2 10*3/uL (ref 4.0–10.5)
nRBC: 0 % (ref 0.0–0.2)

## 2021-08-07 LAB — FERRITIN: Ferritin: 179 ng/mL (ref 24–336)

## 2021-08-07 LAB — TROPONIN I (HIGH SENSITIVITY)
Troponin I (High Sensitivity): 4 ng/L (ref <18)
Troponin I (High Sensitivity): 4 ng/L (ref <18)

## 2021-08-07 LAB — IRON AND TIBC
Iron: 17 ug/dL — ABNORMAL LOW (ref 45–182)
Saturation Ratios: 8 % — ABNORMAL LOW (ref 17.9–39.5)
TIBC: 226 ug/dL — ABNORMAL LOW (ref 250–450)
UIBC: 209 ug/dL

## 2021-08-07 LAB — LIPASE, BLOOD: Lipase: 23 U/L (ref 11–51)

## 2021-08-07 LAB — GLUCOSE, CAPILLARY: Glucose-Capillary: 124 mg/dL — ABNORMAL HIGH (ref 70–99)

## 2021-08-07 SURGERY — ESOPHAGOGASTRODUODENOSCOPY (EGD) WITH PROPOFOL
Anesthesia: Monitor Anesthesia Care

## 2021-08-07 MED ORDER — PANTOPRAZOLE SODIUM 40 MG IV SOLR
40.0000 mg | Freq: Two times a day (BID) | INTRAVENOUS | Status: DC
  Administered 2021-08-07 – 2021-08-16 (×17): 40 mg via INTRAVENOUS
  Filled 2021-08-07 (×17): qty 10

## 2021-08-07 MED ORDER — IOHEXOL 300 MG/ML  SOLN
100.0000 mL | Freq: Once | INTRAMUSCULAR | Status: AC | PRN
Start: 2021-08-07 — End: 2021-08-07
  Administered 2021-08-07: 100 mL via INTRAVENOUS

## 2021-08-07 MED ORDER — PHENYLEPHRINE 80 MCG/ML (10ML) SYRINGE FOR IV PUSH (FOR BLOOD PRESSURE SUPPORT)
PREFILLED_SYRINGE | INTRAVENOUS | Status: DC | PRN
  Administered 2021-08-07: 80 ug via INTRAVENOUS

## 2021-08-07 MED ORDER — ALBUTEROL SULFATE HFA 108 (90 BASE) MCG/ACT IN AERS
INHALATION_SPRAY | RESPIRATORY_TRACT | Status: DC | PRN
  Administered 2021-08-07: 5 via RESPIRATORY_TRACT

## 2021-08-07 MED ORDER — PROPOFOL 500 MG/50ML IV EMUL
INTRAVENOUS | Status: AC
  Filled 2021-08-07: qty 50

## 2021-08-07 MED ORDER — ONDANSETRON HCL 4 MG/2ML IJ SOLN
4.0000 mg | Freq: Once | INTRAMUSCULAR | Status: AC
  Administered 2021-08-07: 4 mg via INTRAVENOUS
  Filled 2021-08-07: qty 2

## 2021-08-07 MED ORDER — ONDANSETRON HCL 4 MG PO TABS: 4.0000 mg | ORAL_TABLET | Freq: Four times a day (QID) | ORAL | Status: DC | PRN

## 2021-08-07 MED ORDER — PROPOFOL 500 MG/50ML IV EMUL
INTRAVENOUS | Status: DC | PRN
  Administered 2021-08-07: 100 ug/kg/min via INTRAVENOUS

## 2021-08-07 MED ORDER — HYDROMORPHONE HCL 1 MG/ML IJ SOLN: 0.7500 mg | INTRAMUSCULAR | Status: DC | PRN

## 2021-08-07 MED ORDER — SODIUM CHLORIDE 0.9 % IV SOLN: INTRAVENOUS | Status: DC

## 2021-08-07 MED ORDER — SUCCINYLCHOLINE CHLORIDE 200 MG/10ML IV SOSY
PREFILLED_SYRINGE | INTRAVENOUS | Status: DC | PRN
  Administered 2021-08-07: 120 mg via INTRAVENOUS

## 2021-08-07 MED ORDER — ONDANSETRON HCL 4 MG/2ML IJ SOLN
INTRAMUSCULAR | Status: DC | PRN
  Administered 2021-08-07: 4 mg via INTRAVENOUS

## 2021-08-07 MED ORDER — PROPOFOL 10 MG/ML IV BOLUS
INTRAVENOUS | Status: DC | PRN
  Administered 2021-08-07: 30 mg via INTRAVENOUS
  Administered 2021-08-07: 20 mg via INTRAVENOUS
  Administered 2021-08-07: 10 mg via INTRAVENOUS
  Administered 2021-08-07: 20 mg via INTRAVENOUS
  Administered 2021-08-07: 30 mg via INTRAVENOUS
  Administered 2021-08-07: 150 mg via INTRAVENOUS

## 2021-08-07 MED ORDER — LACTATED RINGERS IV SOLN: INTRAVENOUS | Status: DC

## 2021-08-07 MED ORDER — PROPOFOL 10 MG/ML IV BOLUS
INTRAVENOUS | Status: AC
  Filled 2021-08-07: qty 20

## 2021-08-07 MED ORDER — SODIUM CHLORIDE 0.9 % IV BOLUS
1000.0000 mL | Freq: Once | INTRAVENOUS | Status: AC
Start: 2021-08-07 — End: 2021-08-07
  Administered 2021-08-07: 1000 mL via INTRAVENOUS

## 2021-08-07 MED ORDER — ALBUTEROL SULFATE HFA 108 (90 BASE) MCG/ACT IN AERS
INHALATION_SPRAY | RESPIRATORY_TRACT | Status: AC
  Filled 2021-08-07: qty 6.7

## 2021-08-07 MED ORDER — LIDOCAINE 2% (20 MG/ML) 5 ML SYRINGE
INTRAMUSCULAR | Status: DC | PRN
  Administered 2021-08-07: 40 mg via INTRAVENOUS
  Administered 2021-08-07: 100 mg via INTRAVENOUS

## 2021-08-07 MED ORDER — DEXAMETHASONE SODIUM PHOSPHATE 10 MG/ML IJ SOLN
INTRAMUSCULAR | Status: DC | PRN
  Administered 2021-08-07: 8 mg via INTRAVENOUS

## 2021-08-07 MED ORDER — IOHEXOL 9 MG/ML PO SOLN: 500.0000 mL | ORAL | Status: AC

## 2021-08-07 MED ORDER — LACTATED RINGERS IV SOLN: INTRAVENOUS | Status: DC | PRN

## 2021-08-07 MED ORDER — ONDANSETRON HCL 4 MG/2ML IJ SOLN: 4.0000 mg | Freq: Four times a day (QID) | INTRAMUSCULAR | Status: DC | PRN

## 2021-08-07 SURGICAL SUPPLY — 15 items

## 2021-08-07 NOTE — Transfer of Care (Signed)
Immediate Anesthesia Transfer of Care Note  Patient: Leslie Livingston  Procedure(s) Performed: Procedure(s): ESOPHAGOGASTRODUODENOSCOPY (EGD) WITH PROPOFOL (N/A) BIOPSY  Patient Location: PACU  Anesthesia Type:General  Level of Consciousness: Patient easily awoken, sedated, comfortable, cooperative, following commands, responds to stimulation.   Airway & Oxygen Therapy: Patient spontaneously breathing, ventilating well, oxygen via simple oxygen mask.  Post-op Assessment: Report given to PACU RN, vital signs reviewed and stable, moving all extremities.   Post vital signs: Reviewed and stable.  Complications: No apparent anesthesia complications  Last Vitals:  Vitals Value Taken Time  BP 148/82 08/07/21 1420  Temp    Pulse 65 08/07/21 1421  Resp 15 08/07/21 1421  SpO2 99 % 08/07/21 1421  Vitals shown include unvalidated device data.  Last Pain:  Vitals:   08/07/21 1207  TempSrc: Temporal  PainSc: 0-No pain         Complications: No notable events documented.

## 2021-08-07 NOTE — Anesthesia Preprocedure Evaluation (Addendum)
Anesthesia Evaluation  Patient identified by MRN, date of birth, ID band Patient awake    Reviewed: Allergy & Precautions, NPO status , Patient's Chart, lab work & pertinent test results  Airway Mallampati: II  TM Distance: >3 FB     Dental   Pulmonary former smoker,    breath sounds clear to auscultation       Cardiovascular hypertension,  Rhythm:Regular Rate:Normal     Neuro/Psych    GI/Hepatic Neg liver ROS, GERD  ,  Endo/Other  negative endocrine ROS  Renal/GU negative Renal ROS     Musculoskeletal  (+) Arthritis ,   Abdominal   Peds  Hematology   Anesthesia Other Findings   Reproductive/Obstetrics                             Anesthesia Physical Anesthesia Plan  ASA: 3  Anesthesia Plan: General   Post-op Pain Management:    Induction: Intravenous  PONV Risk Score and Plan: Treatment may vary due to age or medical condition, Ondansetron and Propofol infusion  Airway Management Planned: Nasal Cannula and Simple Face Mask  Additional Equipment:   Intra-op Plan:   Post-operative Plan:   Informed Consent: I have reviewed the patients History and Physical, chart, labs and discussed the procedure including the risks, benefits and alternatives for the proposed anesthesia with the patient or authorized representative who has indicated his/her understanding and acceptance.     Dental advisory given  Plan Discussed with: CRNA and Anesthesiologist  Anesthesia Plan Comments:        Anesthesia Quick Evaluation

## 2021-08-07 NOTE — ED Provider Notes (Signed)
Leslie Livingston   CSN: [id number] Arrival date & time: 08/07/21  3244     History  No chief complaint on file.   Leslie Livingston is a 71 y.o. male.  Patient with history of hypertension hyperlipidemia here with several month history of difficulty eating and difficulty swallowing.  He has had several evaluations for the same.  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  He was apparently referred to GI but has not seen them yet.  He comes in today because he states he cannot eat anything other than a few sips of Ensure.  He is able to swallow a few sips at a time but not eat or drink anything significantly.  States he is spitting up frequently.  Having difficulty swallowing difficulty sleeping because of discomfort in his throat from phlegm. Denies feeling short of breath.  Denies having any chest pain, cough or fever. No abdominal pain. He did not seem to be aware of the CT results until he was informed of them today.  The history is provided by the patient.       Home Medications Prior to Admission medications   Medication Sig Start Date End Date Taking? Authorizing Provider  amLODipine (NORVASC) 5 MG tablet Take 5 mg by mouth daily.    [provider]  lisinopril (PRINIVIL,ZESTRIL) 20 MG tablet Take 20 mg by mouth daily. 02/23/18   [provider]  loratadine (CLARITIN) 10 MG tablet Take 1 tablet (10 mg total) by mouth daily. 06/25/21   Rayna Sexton, PA-C  naproxen (NAPROSYN) 375 MG tablet Take 1 tablet (375 mg total) by mouth 2 (two) times daily with a meal. 06/25/21   Rayna Sexton, PA-C      Allergies    Patient has no known allergies.    Review of Systems   Review of Systems  Constitutional:  Positive for activity change,  appetite change and fatigue. Negative for fever.  HENT:  Positive for trouble swallowing.   Respiratory:  Negative for chest tightness and shortness of breath.   Cardiovascular:  Negative for chest pain.  Gastrointestinal:  Negative for abdominal pain, nausea and vomiting.  Genitourinary:  Negative for dysuria and hematuria.  Musculoskeletal:  Negative for arthralgias and myalgias.  Skin:  Negative for rash.  Neurological:  Positive for weakness. Negative for dizziness and headaches.   all other systems are negative except as noted in the HPI and PMH.    Physical Exam Updated Vital Signs BP (!) 116/98 (BP Location: Left Arm)   Pulse 73   Temp 98.4 F (36.9 C) (Oral)   Resp 18   SpO2 97%  Physical Exam Vitals and nursing Livingston reviewed.  Constitutional:      General: He is not in acute distress.    Appearance: He is well-developed.  HENT:     Head: Normocephalic and atraumatic.     Mouth/Throat:     Pharynx: No oropharyngeal exudate.     Comments: Controlling secretions, no distress, no malocclusion or trismus Uvula midline. No tongue or lip swelling Eyes:     Conjunctiva/sclera: Conjunctivae normal.     Pupils: Pupils are equal, round, and reactive to light.  Neck:     Comments: No meningismus. Cardiovascular:     Rate and Rhythm: Normal  rate and regular rhythm.     Heart sounds: Normal heart sounds. No murmur heard. Pulmonary:     Effort: Pulmonary effort is normal. No respiratory distress.     Breath sounds: Normal breath sounds.  Abdominal:     Palpations: Abdomen is soft.     Tenderness: There is no abdominal tenderness. There is no guarding or rebound.  Musculoskeletal:        General: No tenderness. Normal range of motion.     Cervical back: Normal range of motion and neck supple.  Skin:    General: Skin is warm.  Neurological:     Mental Status: He is alert and oriented to person, place, and time.     Cranial Nerves: No cranial nerve deficit.     Motor: No  abnormal muscle tone.     Coordination: Coordination normal.     Comments:  5/5 strength throughout. CN 2-12 intact.Equal grip strength.   Psychiatric:        Behavior: Behavior normal.     ED Results / Procedures / Treatments   Labs (all labs ordered are listed, but only abnormal results are displayed) Labs Reviewed  CBC WITH DIFFERENTIAL/PLATELET - Abnormal; Notable for the following components:      Result Value   RBC 3.77 (*)    Hemoglobin 10.7 (*)    HCT 33.6 (*)    Platelets 417 (*)    All other components within normal limits  COMPREHENSIVE METABOLIC PANEL - Abnormal; Notable for the following components:   Glucose, Bld 105 (*)    AST 12 (*)    All other components within normal limits  IRON AND TIBC - Abnormal; Notable for the following components:   Iron 17 (*)    TIBC 226 (*)    Saturation Ratios 8 (*)    All other components within normal limits  LIPASE, BLOOD  FERRITIN  URINALYSIS, ROUTINE W REFLEX MICROSCOPIC  SURGICAL PATHOLOGY  TROPONIN I (HIGH SENSITIVITY)  TROPONIN I (HIGH SENSITIVITY)    EKG EKG Interpretation  Date/Time:  Friday August 07 2021 09:44:10 EDT Ventricular Rate:  70 PR Interval:  166 QRS Duration: 92 QT Interval:  394 QTC Calculation: 426 R Axis:   75 Text Interpretation: Sinus rhythm Minimal ST elevation, anterior leads No significant change was found Confirmed by Ezequiel Essex [id number]) on 08/07/2021 9:46:51 AM  Radiology No results found.  Procedures Procedures    Medications Ordered in ED Medications  sodium chloride 0.9 % bolus 1,000 mL (has no administration in time range)  ondansetron (ZOFRAN) injection 4 mg (has no administration in time range)    ED Course/ Medical Decision Making/ A&P                           Medical Decision Making Amount and/or Complexity of Data Reviewed Labs: ordered. Decision-making details documented in ED Course. Radiology: ordered and independent interpretation performed.  Decision-making details documented in ED Course. ECG/medicine tests: ordered and independent interpretation performed. Decision-making details documented in ED Course.  Risk Prescription drug management. Decision regarding hospitalization.  2 months of dysphagia, difficulty eating, difficulty drinking, difficulty swallowing.  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  He has no trouble handling his secretions currently or impending airway compromise.  We will obtain labs given patient's report of not being able to eat or drink.  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  Discussed with  Cottie Banda who will evaluate patient for expedited EGD.  Patient given IV fluids.  Labs reassuring, show stable anemia, electrolytes and creatinine are normal.  Discussed with gastroenterology team.  We will plan EGD later today.  Discussed with Dr. Benay Spice of oncology.  He will consult but recommends tissue biopsy first and likely radiation consult.  Given patient's inability to eat or drink will admit for hydration. D/w Dr. Olevia Bowens.        Final Clinical Impression(s) / ED Diagnoses Final diagnoses:  Dysphagia, unspecified type  Esophageal mass    Rx / DC Orders ED Discharge Orders     None         Raejean Swinford, Annie Main, MD 08/07/21 1628

## 2021-08-07 NOTE — Consult Note (Signed)
New Hematology/Oncology Consult   Requesting MD: Tennis Must        Reason for Consult: Esophagus mass  HPI: Leslie Livingston reports a sore throat for the past 3 months.  He has been evaluated in the emergency room on several occasions with this complaint.  He was referred to ENT on 07/14/2021.  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  No mass at the base of the tongue, supraglottis, or hypopharynx.  CTs of the neck and chest on 07/29/2021 revealed enlarged mediastinal and supraclavicular lymph nodes.  Diffuse esophageal wall thickening, most pronounced in the proximal esophagus.  Multiple pulmonary nodules including a 2.1 cm left lower lobe nodule.  He reports developing progressive solid/liquid dysphagia and a distant sore throat with odynophagia over the past month.  He presented to the emergency room this morning.  Dr. Ardis Hughs was consulted and he was taken to an upper endoscopy today.  An ulcerated mass extending from the UES to 30 cm.  Biopsies were obtained.  The mass cannot be passed with a standard endoscope.  The pediatric scope was used to complete the procedure.  The remaining esophagus, stomach, and duodenum appeared normal.   The surgical service has been consulted to consider placement of a surgical gastrostomy tube.  Past Medical History:  Diagnosis Date   Allergy    Arthritis    Hyperlipidemia    Hypertension     .  History of a diverticular bleed                                                                                      April 2020   .  History of colon polyps, tubular adenomas  Past Surgical History:  Procedure Laterality Date   COLONOSCOPY WITH PROPOFOL N/A 05/05/2018   Procedure: COLONOSCOPY WITH PROPOFOL;  Surgeon: Irene Shipper, MD;  Location: [facility];  Service: Endoscopy;  Laterality: N/A;  :   Current Facility-Administered Medications:    HYDROmorphone (DILAUDID) injection 0.75 mg, 0.75 mg, Intravenous, Q3H PRN, Reubin Milan, MD   iohexol (OMNIPAQUE) 9 MG/ML oral solution 500 mL, 500 mL, Oral, Q1H, Reubin Milan, MD   ondansetron [facility]) tablet 4 mg, 4 mg, Oral, Q6H PRN **OR** ondansetron (ZOFRAN) injection 4 mg, 4 mg, Intravenous, Q6H PRN, Reubin Milan, MD   pantoprazole (PROTONIX) injection 40 mg, 40 mg, Intravenous, Q12H, Reubin Milan, MD:   iohexol  500 mL Oral Q1H   pantoprazole (PROTONIX) IV  40 mg Intravenous Q12H  :  No Known Allergies:   SOCIAL HISTORY: He lives alone in [place].  He is retired from [employer].  He has a history of tobacco and alcohol use.  He quit smoking and drinking when he developed a sore throat and dysphagia in 2023  Review of Systems:  Positives include: Sore throat, odynophagia, solid/liquid dysphagia, arthritis pain-chiefly at the right hip  A complete ROS was otherwise negative.   Physical Exam:  Blood pressure 131/79, pulse 60, temperature 98.3 F (36.8 C), temperature source Oral, resp. rate 18, height '6\' 1"'$  (1.854 m), weight 200 lb (90.7 kg), SpO2 98 %.  HEENT: Oropharynx  without visible mass, neck without mass Lungs: Clear bilaterally Cardiac: Regular rate and rhythm Abdomen: Nontender, no hepatosplenomegaly GU: Testes without mass Vascular: No leg edema Lymph nodes: No cervical, supraclavicular, axillary, or inguinal nodes Neurologic: Alert and oriented, motor exam appears intact in the upper and lower extremities bilaterally Skin: No rash Musculoskeletal: No spine tenderness  LABS:   Recent Labs    08/07/21 0938  WBC 7.2  HGB 10.7*  HCT 33.6*  PLT 417*     Recent Labs    08/07/21 0938  NA 141  K 3.8  CL 107  CO2 24  GLUCOSE 105*  BUN 18  CREATININE 1.07  CALCIUM 9.7      RADIOLOGY:  CT CHEST ABDOMEN PELVIS W CONTRAST  Result Date: 08/07/2021 CLINICAL DATA:  Esophageal mass.  Staging.  * Tracking Code: BO * EXAM: CT CHEST, ABDOMEN, AND PELVIS WITH CONTRAST TECHNIQUE: Multidetector CT imaging  of the chest, abdomen and pelvis was performed following the standard protocol during bolus administration of intravenous contrast. RADIATION DOSE REDUCTION: This exam was performed according to the departmental dose-optimization program which includes automated exposure control, adjustment of the mA and/or kV according to patient size and/or use of iterative reconstruction technique. CONTRAST:  165m OMNIPAQUE IOHEXOL 300 MG/ML  SOLN COMPARISON:  CT neck and chest 07/29/2021. Abdominopelvic CT 06/15/2012. FINDINGS: CT CHEST FINDINGS Cardiovascular: No acute vascular findings. Three-vessel coronary artery atherosclerosis with lesser involvement of the aorta and great vessels. The heart size is normal. There is no pericardial effusion. Mediastinum/Nodes: Long segment wall thickening of the esophagus is again noted, most prominent proximally. Multiple enlarged supraclavicular and mediastinal lymph nodes are again noted bilaterally. A high right paraesophageal node measures 1.5 cm short axis on image 13/2, similar to recent prior study. Subcarinal nodes measure up to 1.3 cm short axis on image 34/2. There are small hilar lymph nodes bilaterally. No axillary adenopathy. No abnormality of the thyroid gland or trachea seen. Lungs/Pleura: No pleural effusion or pneumothorax. Bilateral pulmonary nodularity again noted. A previously referenced nodule medially in the left lower measures 2.4 x 2.2 cm on image 125/4 (previously 2.1 cm maximally). Right middle lobe nodule currently measures 2.0 cm on image 84/4 and demonstrates central cavitation (previously 1.9 cm). Increased subsegmental atelectasis posteriorly in the left upper lobe. Musculoskeletal/Chest wall: No chest wall mass or suspicious osseous findings. Multilevel spondylosis. CT ABDOMEN AND PELVIS FINDINGS Hepatobiliary: The liver is normal in density without suspicious focal abnormality. No evidence of gallstones, gallbladder wall thickening or biliary dilatation.  Pancreas: Unremarkable. No pancreatic ductal dilatation or surrounding inflammatory changes. Spleen: Normal in size without focal abnormality. Adrenals/Urinary Tract: Both adrenal glands appear normal. 1.2 cm cyst in the posterior interpolar region of the left kidney on image 16/7, similar to remote abdominal CT; no follow-up imaging recommended. No evidence of enhancing renal mass, urinary tract calculus or hydronephrosis. The bladder is mildly distended without focal abnormality. Stomach/Bowel: No enteric contrast administered. The stomach appears unremarkable for its degree of distension. No evidence of bowel wall thickening, distention or surrounding inflammatory change. Diverticular changes throughout the colon. The appendix appears normal. Vascular/Lymphatic: Prominent lymph nodes in the upper abdomen are similar to the remote abdominal CT, including a 9 mm gastrohepatic ligament node on image 66/2 and an 8 mm portacaval node on image 69/2. No progressive abdominopelvic adenopathy identified. Aortic and branch vessel atherosclerosis without aneurysm or large vessel occlusion. Reproductive: The prostate gland and seminal vesicles appear unremarkable. Other: No ascites or peritoneal nodularity. Small  umbilical hernia containing only fat. Asymmetric fat in the right inguinal canal. Musculoskeletal: No acute or significant osseous findings. Multilevel lumbar spondylosis. Moderately advanced asymmetric right hip osteoarthritis. IMPRESSION: 1. 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 Bilateral pulmonary nodularity has mildly progressed over the last 9 days. Supraclavicular and mediastinal adenopathy has not significantly changed. 2. No definite metastatic disease in the abdomen or pelvis. Mildly prominent lymph nodes in the upper abdomen are unchanged from remote abdominal CT of 2014, likely reactive. 3. Colonic diverticulosis without acute inflammation. 4. Coronary and Aortic  Atherosclerosis (ICD10-I70.0). Electronically Signed   By: Richardean Sale M.D.   On: 08/07/2021 16:30   CT CHEST WO CONTRAST  Result Date: 07/30/2021 CLINICAL DATA:  Right midlung opacity, hoarseness.  Former smoker. EXAM: CT CHEST WITHOUT CONTRAST TECHNIQUE: Multidetector CT imaging of the chest was performed following the standard protocol without IV contrast. RADIATION DOSE REDUCTION: This exam was performed according to the departmental dose-optimization program which includes automated exposure control, adjustment of the mA and/or kV according to patient size and/or use of iterative reconstruction technique. COMPARISON:  06/10/2021. FINDINGS: Cardiovascular: The heart is normal in size and there is a trace pericardial effusion. Multi-vessel coronary artery calcifications are noted. There is atherosclerotic calcification of the aorta without evidence of aneurysm. The pulmonary trunk is normal in caliber. Mediastinum/Nodes: Enlarged lymph nodes are present in the mediastinum measuring up to 1.5 cm in the right paratracheal space. Enlarged supraclavicular lymph nodes are seen bilaterally. There is a suspected left axillary lymphadenopathy. No axillary lymphadenopathy is seen. The thyroid gland and trachea are within normal limits. There is diffuse esophageal wall thickening, most pronounced in the proximal esophagus. Lungs/Pleura: The airways are patent. Multiple pulmonary nodules are present bilaterally measuring up to 2.1 cm in the left lower lobe medially, axial image 128. The largest nodule on the right measures 1.9 cm in the right middle lobe, axial image 81. No effusion or pneumothorax. Mild atelectasis is present in the posterior aspect of the left upper lobe. Upper Abdomen: There is fatty infiltration of the gastric wall suggesting chronic inflammatory changes. No adrenal nodule or mass. No acute abnormality. Musculoskeletal: Degenerative changes are present in the cervical and thoracic spine. No  suspicious osseous abnormality or acute fracture. IMPRESSION: 1. Diffuse esophageal wall thickening, most pronounced in the proximal esophagus, concerning for malignancy. Endoscopy with tissue sampling is recommended for definitive diagnosis. 2. Multiple pulmonary nodules bilaterally measuring up to 2.1 cm, highly suspicious for metastatic disease. Tissue sampling or PET-CT is suggested for further evaluation. 3. Mediastinal and supraclavicular lymphadenopathy, suspicious for metastatic disease. 4. Coronary artery calcifications. 5. Aortic atherosclerosis. Electronically Signed   By: Brett Fairy M.D.   On: 07/30/2021 03:23   CT SOFT TISSUE NECK W CONTRAST  Result Date: 07/29/2021 CLINICAL DATA:  Hoarseness in throat pain with vocal cord paralysis for 3 months. EXAM: CT NECK WITH CONTRAST TECHNIQUE: Multidetector CT imaging of the neck was performed using the standard protocol following the bolus administration of intravenous contrast. RADIATION DOSE REDUCTION: This exam was performed according to the departmental dose-optimization program which includes automated exposure control, adjustment of the mA and/or kV according to patient size and/or use of iterative reconstruction technique. CONTRAST:  15m ISOVUE-300 IOPAMIDOL (ISOVUE-300) INJECTION 61% COMPARISON:  None Available. FINDINGS: Pharynx and larynx: No evidence of mass or inflammation. Enlarged left laryngeal ventricle and piriform sinus attributed to vocal cord paresis on the left. The underlying cause is attributed to a large mass  circumferentially affecting the upper esophagus. Salivary glands: Asymmetric lobulation upward and posterior from the right submandibular gland, but isodense to parenchyma and not convincing for mass on reformats. This will be re-evaluated at surveillance imaging given the following. Thyroid: Negative Lymph nodes: Adenopathy at the thoracic inlet and lower bilateral jugular chain on both sides with low-density centers,  attributed to metastatic disease. A left-sided node on 3:97 measures 18 mm. Vascular: Atheromatous wall thickening in the neck. No acute vascular finding. Limited intracranial: Remote left inferior cerebellar infarction. Visualized orbits: Negative Mastoids and visualized paranasal sinuses: Retention cysts along the lower maxillary sinuses. Skeleton: Diffuse idiopathic skeletal hyperostosis with bulky spondylitic spurring throughout the cervical spine encroaching on the prevertebral space. Upper chest: Esophageal mass with thoracic adenopathy and pulmonary nodules. IMPRESSION: 1. Bulky esophageal malignancy at the thoracic inlet with lower cervical and thoracic adenopathy and pulmonary nodules, reference dedicated chest CT. 2. 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 Electronically Signed   By: Jorje Guild M.D.   On: 07/29/2021 18:37    Assessment and Plan:   Esophagus mass with chest lymphadenopathy and lung nodules CT neck 07/29/2021-adenopathy at the thoracic inlet and lower bilateral jugular chains, esophagus mass, left vocal cord paralysis CT chest 07/29/2021-diffuse esophageal wall thickening, most pronounced in the proximal esophagus, multiple pulmonary nodules, mediastinal and supraclavicular lymphadenopathy Upper endoscopy 08/07/2021-mass/stricture extending from the UES to 30 cm CTs chest, abdomen, and pelvis 08/07/2021-long segment wall thickening of the esophagus, prominent proximally, multiple large supraclavicular mediastinal nodes, bilateral lung nodules,, and upper abdominal nodes similar to remote abdominal CT  Solid/liquid dysphagia secondary to #1 Weight loss secondary to #1 Odynophagia secondary to #1 History of colon polyps-tubular adenomas History of a diverticular bleed-April 2020 Anemia Thrombocytosis History of tobacco and alcohol use   Leslie Livingston presents with esophagus mass, neck/chest adenopathy, and lung nodules.  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  I discussed the probable diagnosis with Leslie Livingston.  He is symptomatic with severe solid/liquid dysphagia.  I agree with feeding tube placement to allow for hydration/nutrition while he undergoes treatment.  I consulted radiation oncology.  We will wait on the pathology report from the esophagus biopsy prior to initiating treatment.  He appears to be a candidate for a course of palliative radiation to the upper esophagus to be followed by systemic therapy.  We will submit the tumor for PD-1 and NGS testing if a diagnosis of esophagus cancer is confirmed.  I will check on Leslie Livingston within the next few days and outpatient follow-up will be scheduled at the Cancer center.  Recommendations: Feeding tube placement per surgery Radiation oncology consult-I contacted them Follow-up on esophagus biopsy results Initiation of palliative radiation and systemic therapy when a tissue diagnosis confirmed.   Betsy Coder, MD 08/07/2021, 4:58 PM

## 2021-08-07 NOTE — Consult Note (Signed)
[facility] Surgery Consult Note  Leslie Livingston September 15, 1950  [id number].    Requesting MD: Tennis Must Chief Complaint/Reason for Consult: G tube  HPI:  Leslie Livingston is a 71 y.o. male PMH HTN, HLD, and tobacco use until recently who was admitted to [facility] today after presenting to the ED with dysphagia that has gradually gotten worse over the last month. He has a known esophageal mass suspicious for malignancy that was just beginning outpatient work up. He denies any abdominal pain, nausea, vomiting. Has been tolerating liquids at home including Ensure.  He underwent upper endoscopy today by Dr. Ardis Hughs which revealed a tight, 10cm long, circumferential proximal esophageal mass extending from UES to about 30cm from the incisors; this mass was friable, ulcerated and is most certainly a malignancy; several biopsies were taken; the esophagus was otherwise normal to the GE junction. IR unable to place G tube. General surgery asked to see for consideration of surgical G tube.  Abdominal surgical history: none Anticoagulants: none Lives at home alone Retired, used to do heating and air for [employer]    Family History  Problem Relation Age of Onset   Hypertension Other    Colon cancer Neg Hx    Stomach cancer Neg Hx    Esophageal cancer Neg Hx    Rectal cancer Neg Hx     Past Medical History:  Diagnosis Date   Allergy    Arthritis    Hyperlipidemia    Hypertension     Past Surgical History:  Procedure Laterality Date   COLONOSCOPY WITH PROPOFOL N/A 05/05/2018   Procedure: COLONOSCOPY WITH PROPOFOL;  Surgeon: Irene Shipper, MD;  Location: [facility];  Service: Endoscopy;  Laterality: N/A;    Social History:  reports that he quit smoking about 2 months ago. His smoking use included cigarettes. He smoked an average of .25 packs per day. He has been exposed to tobacco smoke. He has never used smokeless tobacco. He reports current alcohol use of about 6.0 standard drinks of  alcohol per week. He reports that he does not use drugs.  Allergies: No Known Allergies  Medications Prior to Admission  Medication Sig Dispense Refill   amLODipine (NORVASC) 5 MG tablet Take 5 mg by mouth daily as needed (SBP >130).     HYDROcodone-acetaminophen (NORCO/VICODIN) 5-325 MG tablet Take 1 tablet by mouth 4 (four) times daily as needed (pain).     ibuprofen (ADVIL) 200 MG tablet Take 400 mg by mouth 2 (two) times daily as needed for headache (pain).     lisinopril (PRINIVIL,ZESTRIL) 20 MG tablet Take 20 mg by mouth daily.     loratadine (CLARITIN) 10 MG tablet Take 1 tablet (10 mg total) by mouth daily. (Patient not taking: Reported on 08/07/2021) 30 tablet 0   naproxen (NAPROSYN) 375 MG tablet Take 1 tablet (375 mg total) by mouth 2 (two) times daily with a meal. (Patient not taking: Reported on 08/07/2021) 20 tablet 0    Prior to Admission medications   Medication Sig Start Date End Date Taking? Authorizing Provider  amLODipine (NORVASC) 5 MG tablet Take 5 mg by mouth daily as needed (SBP >130).   Yes [provider]  HYDROcodone-acetaminophen (NORCO/VICODIN) 5-325 MG tablet Take 1 tablet by mouth 4 (four) times daily as needed (pain). 07/29/21  Yes [provider]  ibuprofen (ADVIL) 200 MG tablet Take 400 mg by mouth 2 (two) times daily as needed for headache (pain).   Yes [provider]  lisinopril (  PRINIVIL,ZESTRIL) 20 MG tablet Take 20 mg by mouth daily. 02/23/18  Yes [provider]  loratadine (CLARITIN) 10 MG tablet Take 1 tablet (10 mg total) by mouth daily. Patient not taking: Reported on 08/07/2021 06/25/21   Rayna Sexton, PA-C  naproxen (NAPROSYN) 375 MG tablet Take 1 tablet (375 mg total) by mouth 2 (two) times daily with a meal. Patient not taking: Reported on 08/07/2021 06/25/21   Rayna Sexton, PA-C    Blood pressure 131/79, pulse 60, temperature 98.3 F (36.8 C), temperature source Oral, resp. rate 18, height '6\' 1"'$  (1.854 m),  weight 90.7 kg, SpO2 98 %. Physical Exam: General: pleasant, WD/WN male who is laying in bed in NAD HEENT: head is normocephalic, atraumatic.  Sclera are noninjected.  Pupils equal and round.  Ears and nose without any masses or lesions.  Mouth is pink and moist. Dentition fair Heart: regular, rate, and rhythm.  Normal s1,s2. No obvious murmurs, gallops, or rubs noted.  Palpable radial and pedal pulses bilaterally  Lungs: CTAB, no wheezes, rhonchi, or rales noted.  Respiratory effort nonlabored Abd: soft, NT/ND, +BS, no masses, hernias, or organomegaly MS: no BUE/BLE edema, calves soft and nontender Skin: warm and dry with no masses, lesions, or rashes Psych: A&Ox4 with an appropriate affect Neuro: MAEs, no gross motor or sensory deficits BUE/BLE  Results for orders placed or performed during the hospital encounter of 08/07/21 (from the past 48 hour(s))  CBC with Differential     Status: Abnormal   Collection Time: 08/07/21  9:38 AM  Result Value Ref Range   WBC 7.2 4.0 - 10.5 K/uL   RBC 3.77 (L) 4.22 - 5.81 MIL/uL   Hemoglobin 10.7 (L) 13.0 - 17.0 g/dL   HCT 33.6 (L) 39.0 - 52.0 %   MCV 89.1 80.0 - 100.0 fL   MCH 28.4 26.0 - 34.0 pg   MCHC 31.8 30.0 - 36.0 g/dL   RDW 12.2 11.5 - 15.5 %   Platelets 417 (H) 150 - 400 K/uL   nRBC 0.0 0.0 - 0.2 %   Neutrophils Relative % 57 %   Neutro Abs 4.2 1.7 - 7.7 K/uL   Lymphocytes Relative 29 %   Lymphs Abs 2.1 0.7 - 4.0 K/uL   Monocytes Relative 10 %   Monocytes Absolute 0.7 0.1 - 1.0 K/uL   Eosinophils Relative 3 %   Eosinophils Absolute 0.2 0.0 - 0.5 K/uL   Basophils Relative 1 %   Basophils Absolute 0.0 0.0 - 0.1 K/uL   Immature Granulocytes 0 %   Abs Immature Granulocytes 0.02 0.00 - 0.07 K/uL    Comment: Performed at [facility], [place] [address] [place], [place] [address]  Comprehensive metabolic panel     Status: Abnormal   Collection Time: 08/07/21  9:38 AM  Result Value Ref Range   Sodium 141 135 - 145 mmol/L    Potassium 3.8 3.5 - 5.1 mmol/L   Chloride 107 98 - 111 mmol/L   CO2 24 22 - 32 mmol/L   Glucose, Bld 105 (H) 70 - 99 mg/dL    Comment: Glucose reference range applies only to samples taken after fasting for at least 8 hours.   BUN 18 8 - 23 mg/dL   Creatinine, Ser 1.07 0.61 - 1.24 mg/dL   Calcium 9.7 8.9 - 10.3 mg/dL   Total Protein 8.1 6.5 - 8.1 g/dL   Albumin 3.6 3.5 - 5.0 g/dL   AST 12 (L) 15 - 41 U/L  ALT 12 0 - 44 U/L   Alkaline Phosphatase 74 38 - 126 U/L   Total Bilirubin 0.6 0.3 - 1.2 mg/dL   GFR, Estimated >60 >60 mL/min    Comment: (NOTE) Calculated using the CKD-EPI Creatinine Equation (2021)    Anion gap 10 5 - 15    Comment: Performed at [facility], [place] [address] [place], [place] [address]  Troponin I (High Sensitivity)     Status: None   Collection Time: 08/07/21  9:38 AM  Result Value Ref Range   Troponin I (High Sensitivity) 4 <18 ng/L    Comment: (NOTE) Elevated high sensitivity troponin I (hsTnI) values and significant  changes across serial measurements may suggest ACS but many other  chronic and acute conditions are known to elevate hsTnI results.  Refer to the "Links" section for chest pain algorithms and additional  guidance. Performed at [facility], [place] [address], [place], [place] [address]   Lipase, blood     Status: None   Collection Time: 08/07/21  9:38 AM  Result Value Ref Range   Lipase 23 11 - 51 U/L    Comment: Performed at [facility], [place] [address] [place], [place] [address]   No results found.  Anti-infectives (From admission, onward)    None        Assessment/Plan Esophageal mass Dysphagia - s/p upper endoscopy today by Dr. Ardis Hughs which revealed a tight, 10cm long, circumferential proximal esophageal mass extending from UES to about 30cm from the incisors; this mass was friable, ulcerated and is most certainly a malignancy; several biopsies were taken; the  esophagus was otherwise normal to the GE junction - IR unable to place G tube - Oncology and radiation oncology to see. CT's for staging pending - Will discuss timing of surgical G tube with MD. Suspect patient will also need port if chemo is recommended by oncology. Will await their recommendations so we can potentially do both under one anesthesia event.   ID - none VTE - SCDs FEN - IVF, sips water per GI Foley - none  Anemia HTN HLD Tobacco use until recent  I reviewed Consultant gastroenterology notes, hospitalist notes, last 24 h vitals and pain scores, last 48 h intake and output, last 24 h labs and trends, and last 24 h imaging results   Wellington [place], [facility] 08/07/2021, 3:39 PM Please see Amion for pager number during day hours 7:00am-4:30pm

## 2021-08-07 NOTE — Op Note (Signed)
[facility] Patient Name: Leslie Livingston Procedure Date: 08/07/2021 MRN: [id number] Attending MD: Milus Banister , MD Date of Birth: February 08, 1950 CSN: [id number] Age: 71 Admit Type: Inpatient Procedure:                Upper GI endoscopy Indications:              Dysphagia, weight loss, CTs suggest metastatic                            esophageal cancer Providers:                Milus Banister, MD, Carlyn Reichert, RN, Darliss Cheney,                            Technician Referring MD:              Medicines:                MAC to general (he was gagging and had airway                            spasms so we elected to intubate him). Complications:            No immediate complications. Estimated blood loss:                            None. Estimated Blood Loss:     Estimated blood loss: none. Procedure:                Pre-Anesthesia Assessment:                           - Prior to the procedure, a History and Physical                            was performed, and patient medications and                            allergies were reviewed. The patient's tolerance of                            previous anesthesia was also reviewed. The risks                            and benefits of the procedure and the sedation                            options and risks were discussed with the patient.                            All questions were answered, and informed consent                            was obtained. Prior Anticoagulants: The patient has  taken no previous anticoagulant or antiplatelet                            agents. ASA Grade Assessment: III - A patient with                            severe systemic disease. After reviewing the risks                            and benefits, the patient was deemed in                            satisfactory condition to undergo the procedure.                           After obtaining informed consent, the  endoscope was                            passed under direct vision. Throughout the                            procedure, the patient's blood pressure, pulse, and                            oxygen saturations were monitored continuously. The                            [id number]-[id number] ([id number] Olympus endoscope was introduced                            through the mouth, and advanced to the second part                            of duodenum. The GIF-[id number] ([id number] Olympus slim                            endoscope was introduced through the and advanced                            to the. The upper GI endoscopy was accomplished                            without difficulty. The patient tolerated the                            procedure well. Scope In: Scope Out: Findings:      Standard adult gastroscope could not pass into his very tortuous and       narrowed UES, even after elective airway intubation for gagging and       airway spasm and so I used a pediatric gastroscope to complete the exam.      Tight, 10cm long, circumferential proximal esophageal mass that extends       from the UES to about 30cm from the incisors. The mass was friable,  ulcerated and is most certainly a malignancy. I took several biopsies       with pediatric forceps.      The esophagus was otherwise normal to the GE junction.      The stomach and duodenum were normal.      The exam was otherwise without abnormality. Impression:               - Standard adult gastroscope could not pass into                            his very tortuous and narrowed UES, even after                            elective airway intubation for gagging and airway                            spasm and so I used a pediatric gastroscope to                            complete the exam.                           - Tight, 10cm long, circumferential proximal                            esophageal mass that extends from the UES to about                             30cm from the incisors. The mass was friable,                            ulcerated and is most certainly a malignancy. I                            took several biopsies with pediatric forceps.                           - The esophagus was otherwise normal to the GE                            junction.                           - The stomach and duodenum were normal.                           - The mass is nearly completely obstructing his                            proximal esophagus. Stenting the mass is not                            possible due to such proximal location. He needs  interventional radiology G tube placement, complete                            staging CT scan (only has non-contrast chest and                            neck), and expedited oncology, radiation therapy                            evaluation (probably after these pathology results                            are back). OK for sips of water for now.                           - Will sign off, please call or page with any                            further questions or concerns. Moderate Sedation:      Not Applicable - Patient had care per Anesthesia. Recommendation:           - Return to hospital floor. Procedure Code(s):        --- Professional ---                           [phone], Esophagogastroduodenoscopy, flexible,                            transoral; with biopsy, single or multiple Diagnosis Code(s):        --- Professional ---                           D49.0, Neoplasm of unspecified behavior of                            digestive system                           R13.10, Dysphagia, unspecified CPT copyright 2019 [employer] Association. All rights reserved. The codes documented in this report are preliminary and upon coder review may  be revised to meet current compliance requirements. Milus Banister, MD 08/07/2021 2:08:07 PM This report has been signed  electronically. Number of Addenda: 0

## 2021-08-07 NOTE — Anesthesia Procedure Notes (Signed)
Procedure Name: MAC Date/Time: 08/07/2021 1:19 PM  Performed by: Niel Hummer, CRNAPre-anesthesia Checklist: Patient identified, Emergency Drugs available, Suction available and Patient being monitored Oxygen Delivery Method: Simple face mask

## 2021-08-07 NOTE — Anesthesia Procedure Notes (Signed)
Procedure Name: Intubation Date/Time: 08/07/2021 1:32 PM  Performed by: Niel Hummer, CRNAPre-anesthesia Checklist: Patient identified, Emergency Drugs available, Suction available and Patient being monitored Patient Re-evaluated:Patient Re-evaluated prior to induction Oxygen Delivery Method: Circle system utilized Preoxygenation: Pre-oxygenation with 100% oxygen Induction Type: IV induction and Rapid sequence Laryngoscope Size: Mac and 4 Grade View: Grade II Tube type: Oral Tube size: 7.5 mm Number of attempts: 1 Airway Equipment and Method: Stylet Placement Confirmation: ETT inserted through vocal cords under direct vision, positive ETCO2 and breath sounds checked- equal and bilateral Secured at: 23 cm Tube secured with: Tape Dental Injury: Teeth and Oropharynx as per pre-operative assessment

## 2021-08-07 NOTE — Anesthesia Postprocedure Evaluation (Signed)
Anesthesia Post Note  Patient: Leslie Livingston  Procedure(s) Performed: ESOPHAGOGASTRODUODENOSCOPY (EGD) WITH PROPOFOL BIOPSY     Patient location during evaluation: Endoscopy Anesthesia Type: MAC Level of consciousness: awake Pain management: pain level controlled Vital Signs Assessment: post-procedure vital signs reviewed and stable Respiratory status: spontaneous breathing Cardiovascular status: stable Postop Assessment: no apparent nausea or vomiting Anesthetic complications: no   No notable events documented.  Last Vitals:  Vitals:   08/07/21 1450 08/07/21 1514  BP: 129/66 131/79  Pulse: 63 60  Resp: (!) 23 18  Temp:  36.8 C  SpO2: 97% 98%    Last Pain:  Vitals:   08/07/21 1514  TempSrc: Oral  PainSc:                  [name]

## 2021-08-07 NOTE — Consult Note (Addendum)
Referring Provider: Dr. Jacinto Halim Primary Care Physician:  Lorene Dy, MD Primary Gastroenterologist:  Dr. Lucio Edward  Reason for Consultation: Esophageal mass, dysphagia  HPI: Leslie Livingston is a 71 y.o. male with a past medical history of arthritis, hypertension, hyperlipidemia, colon polyps and suspected diverticular GI bleed 04/2018.  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 He underwent a chest CT 07/29/2021 showed diffuse esophageal wall thickening, pronounced to the proximal esophagus concerning for malignancy, multiple pulmonary nodules and mediastinal/supraclavicular lymphadenopathy suspicious for metastatic disease.  He was referred to our outpatient GI clinic, appointment not yet scheduled.  He presented to the ED this morning with difficulty swallowing any liquids or food.  He is having difficulty sleeping due to having throat pain and difficulty managing his secretions/phlegm.  Labs in the ED showed WBC count of 7.2.  Hemoglobin 10.7.  Hematocrit 33.6.  Platelet 417.  Sodium 141.  Potassium 3.8.  BUN 18.  Creatinine 1.07.  Albumin 3.6.  Total bili 0.6.  Alk phos 74.  AST 12.  ALT 12.  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  He remains NPO since yesterday evening.  He is not on any anticoagulation.  He reported taking Advil 20 mg 2 tabs p.o. once daily for throat pain and arthritis for the past 2 weeks.  He denies having heartburn.  No upper or lower abdominal pain.  He passes a normal brown formed stool every 2 to 3 days.  No rectal bleeding or black stools.  He smokes cigarettes on and off for many years, stopped smoking 4 months ago.  He drinks "heavy" on the weekends when with friends.   He complains of right-sided chest pain and right upper back pain for the past 2 weeks.  No nausea or vomiting.  No fever, sweats or chills.  He has lost 20 pounds over the past 2 to 3 months.   He has a history of a GI bleed/painless hematochezia in the setting of NSAID use admitted to the hospital 04/2018.  Baseline hemoglobin level 13.2 down to 8.9.  There was a possibility of upper GI bleed with dark red bloody stools with a mildly elevated BUN but an EGD was not pursued as a diverticular bleed was suspected.  He underwent a colonoscopy 05/05/2018 which showed severe pandiverticulosis without evidence of active diverticular bleeding.  No known family history of esophageal, gastric or colon cancer.  Chest CT without contrast 07/29/2021: 1. Diffuse esophageal wall thickening, most pronounced in the proximal esophagus, concerning for malignancy. Endoscopy with tissue sampling is recommended for definitive diagnosis. 2. Multiple pulmonary nodules bilaterally measuring up to 2.1 cm, highly suspicious for metastatic disease. Tissue sampling or PET-CT is suggested for further evaluation. 3. Mediastinal and supraclavicular lymphadenopathy, suspicious for metastatic disease. 4. Coronary artery calcifications. 5. Aortic atherosclerosis.  Soft tissue neck CT 07/29/2021: 1. Bulky esophageal malignancy at the thoracic inlet with lower cervical and thoracic adenopathy and pulmonary nodules, reference dedicated chest CT. 2. 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  PAST GI PROCEDURES:  Colonoscopy 05/05/2018 by Dr. Henrene Pastor:  1. Severe pandiverticulosis 2. Self-limited diverticular bleed now resolved 3. Acute blood loss anemia. Stable.  Colonoscopy 11/09/2017: Preparation of the colon was inadequate. - Stool in the entire examined colon. -  Moderate diverticulosis in the left colon. - Mild diverticulosis in the right colon. - The examination was otherwise normal on direct and retroflexion views. - Internal  hemorrhoids. - No specimens collected.  Colonoscopy 09/20/2012: 5 polyps removed Diverticulosis throughout the colon TUBULAR ADENOMA (2) AND HYPERPLASTIC POLYP (3). NO HIGH GRADE DYSPLASIA OR MALIGNANCY IDENTIFIED.   Past Medical History:  Diagnosis Date   Allergy    Arthritis    Hyperlipidemia    Hypertension     Past Surgical History:  Procedure Laterality Date   COLONOSCOPY WITH PROPOFOL N/A 05/05/2018   Procedure: COLONOSCOPY WITH PROPOFOL;  Surgeon: Irene Shipper, MD;  Location: [facility];  Service: Endoscopy;  Laterality: N/A;    Prior to Admission medications   Medication Sig Start Date End Date Taking? Authorizing Provider  amLODipine (NORVASC) 5 MG tablet Take 5 mg by mouth daily.    [provider]  lisinopril (PRINIVIL,ZESTRIL) 20 MG tablet Take 20 mg by mouth daily. 02/23/18   [provider]  loratadine (CLARITIN) 10 MG tablet Take 1 tablet (10 mg total) by mouth daily. 06/25/21   Rayna Sexton, PA-C  naproxen (NAPROSYN) 375 MG tablet Take 1 tablet (375 mg total) by mouth 2 (two) times daily with a meal. 06/25/21   Rayna Sexton, PA-C    No current facility-administered medications for this encounter.   Current Outpatient Medications  Medication Sig Dispense Refill   amLODipine (NORVASC) 5 MG tablet Take 5 mg by mouth daily.     lisinopril (PRINIVIL,ZESTRIL) 20 MG tablet Take 20 mg by mouth daily.     loratadine (CLARITIN) 10 MG tablet Take 1 tablet (10 mg total) by mouth daily. 30 tablet 0   naproxen (NAPROSYN) 375 MG tablet Take 1 tablet (375 mg total) by mouth 2 (two) times daily with a meal. 20 tablet 0    Allergies as of 08/07/2021   (No Known Allergies)    Family History  Problem Relation Age of Onset   Colon cancer Neg Hx    Stomach cancer Neg Hx    Esophageal cancer Neg Hx    Rectal cancer Neg Hx     Social History   Socioeconomic History   Marital status: Single    Spouse name: Not on file   Number of children: Not  on file   Years of education: Not on file   Highest education level: Not on file  Occupational History   Not on file  Tobacco Use   Smoking status: Former    Packs/day: 0.25    Types: Cigarettes    Quit date: 05/2021    Years since quitting: 0.2    Passive exposure: Past   Smokeless tobacco: Never  Vaping Use   Vaping Use: Never used  Substance and Sexual Activity   Alcohol use: Yes    Alcohol/week: 6.0 standard drinks of alcohol    Types: 6 Cans of beer per week   Drug use: No   Sexual activity: Not on file  Other Topics Concern   Not on file  Social History Narrative   Not on file   Social Determinants of Health   Financial Resource Strain: Not on file  Food Insecurity: Not on file  Transportation Needs: Not on file  Physical Activity: Not on file  Stress: Not on file  Social Connections: Not on file  Intimate Partner Violence: Not on file    Review of Systems: Gen: See HPI.   CV: Denies chest pain, palpitations or edema. Resp:  Denies cough, shortness of breath of hemoptysis.  GI: See HPI. GU : Denies urinary burning, blood in urine, increased urinary frequency or incontinence. MS: + Arthritis.  Derm: Denies rash, itchiness, skin lesions or unhealing ulcers. Psych: Denies depression, anxiety or memory loss Heme: Denies easy bruising, bleeding. Neuro:  Denies headaches, dizziness or paresthesias.   Endo:  Denies any problems with DM, thyroid or adrenal function.  Physical Exam: Vital signs in last 24 hours: Temp:  [98.4 F (36.9 C)] 98.4 F (36.9 C) (07/14 0923) Pulse Rate:  [73] 73 (07/14 0923) Resp:  [18] 18 (07/14 0923) BP: (116)/(98) 116/98 (07/14 0923) SpO2:  [97 %] 97 % (07/14 0923) Weight:  [90.7 kg] 90.7 kg (07/14 0940)   General:  Alert 71 year old male in no acute distress Head:  Normocephalic and atraumatic. Eyes:  No scleral icterus. Conjunctiva pink. Ears:  Normal auditory acuity. Nose:  No deformity, discharge or lesions. Mouth:   Dentition intact. No ulcers or lesions.  Neck:  Supple. No lymphadenopathy or thyromegaly.  Lungs: Breath sounds with scattered wheezes bilaterally. Heart: Regular rate and rhythm, no murmurs Abdomen: Soft, nontender.  Positive bowel sounds all 4 quadrants.  No palpable mass. Rectal: Deferred. Musculoskeletal:  Symmetrical without gross deformities.  Pulses:  Normal pulses noted. Extremities:  Without clubbing or edema. Neurologic:  Alert and  oriented x 4. No focal deficits.  Skin:  Intact without significant lesions or rashes. Psych:  Alert and cooperative. Normal mood and affect.  Lab Results: Recent Labs    08/07/21 0938  WBC 7.2  HGB 10.7*  HCT 33.6*  PLT 417*   BMET Recent Labs    08/07/21 0938  NA 141  K 3.8  CL 107  CO2 24  GLUCOSE 105*  BUN 18  CREATININE 1.07  CALCIUM 9.7   LFT Recent Labs    08/07/21 0938  PROT 8.1  ALBUMIN 3.6  AST 12*  ALT 12  ALKPHOS 74  BILITOT 0.6    IMPRESSION/PLAN:  33) 71 year old male with dysphagia with liquids and solid which has progressively worsened over the past few months.  Chest CT 07/29/2021 showed diffuse esophageal wall thickening, pronounced to the proximal esophagus concerning for malignancy, multiple pulmonary nodules and mediastinal/supraclavicular lymphadenopathy suspicious for metastatic disease.  He presented to the ED early this morning as he was having difficulty swallowing his saliva/secretions with worsening throat pain. -NPO -IV fluids and pain management per the hospitalist -EGD today with Dr. Ardis Hughs benefits and risks discussed including risk with sedation, risk of bleeding, perforation and infection  -Ondansetron 4 mg IV every 6 hours as needed -Further recommendations to be determined after EGD completed -Recommend oncology consult  2) Anemia, likely due to spectated esophageal malignancy with metastatic disease.  No overt GI bleeding. -Monitor H&H, -Infuse PRBCs as needed  3) History of GIB,  suspected diverticular bleed 04/2018   Leslie Livingston  08/07/2021, 11:32AM  _______________________________________________________________________________________________________________________________________  Velora Heckler GI MD note:  I personally examined the patient, reviewed the data and agree with the assessment and plan described above.  I provided a substantive portion of the care of this patient (personally provided more than half of the total time dedicated to the treatment of this patient).  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  He is able to swallow his saliva, can drink liquids but that is getting more and more difficult. I am planning EGD today for evaluation.   Leslie Loffler, MD  [facility] Pager [phone]

## 2021-08-07 NOTE — H&P (Signed)
History and Physical    Patient: Leslie Livingston GUR:[id number] DOB: 1950/03/07 DOA: 08/07/2021 DOS: the patient was seen and examined on 08/07/2021 PCP: Lorene Dy, MD  Patient coming from: Home  Chief Complaint:  Chief Complaint  Patient presents with   Insomnia   unable to swallow   Weight Loss   Chest Pain   HPI: Leslie Livingston is a 71 y.o. male with medical history significant of seasonal allergies, osteoarthritis, hyperlipidemia, hypertension, tobacco use until recently who is coming to the emergency department with dysphagia and odynophagia since last month with imaging suspicious for malignant esophageal mass.  He has lost about 15 pounds in the last 2 months.  Decreased bowel movements due to significantly decreased oral intake. He denied fever, chills, rhinorrhea, sore throat, wheezing or hemoptysis.  No chest pain, palpitations, diaphoresis, PND, orthopnea or pitting edema of the lower extremities.  No abdominal pain, nausea, emesis, diarrhea, constipation, melena or hematochezia.  No flank pain, dysuria, frequency or hematuria.  No polyuria, polydipsia, polyphagia or blurred vision.   ED course: Initial vital signs were temperature 98.4 F, pulse 73, respiration 18, BP 116/98 mmHg O2 sat 97% on room air.  Lab work: CBC with a white count 7.2, hemoglobin 10.7 g/dL platelets 417.  Lipase and troponin are normal.  CMP with a glucose of 105 mg/dL but otherwise unremarkable.  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  Chest CT with some afterwords which showed diffuse esophageal wall thickening most pronounced in the proximal esophagus concerning for malignancy.  There are multiple pulmonary nodules bilaterally measuring up to 2.1 cm highly suspicious for metastatic disease.  There are mediastinal and supraclavicular lymphadenopathy, suspicious  for metastatic disease.  There were also coronary artery calcifications and aortic atherosclerosis.   Review of Systems: As mentioned in the history of present illness. All other systems reviewed and are negative.  Past Medical History:  Diagnosis Date   Allergy    Arthritis    Hyperlipidemia    Hypertension    Past Surgical History:  Procedure Laterality Date   COLONOSCOPY WITH PROPOFOL N/A 05/05/2018   Procedure: COLONOSCOPY WITH PROPOFOL;  Surgeon: Irene Shipper, MD;  Location: [facility];  Service: Endoscopy;  Laterality: N/A;   Social History:  reports that he quit smoking about 2 months ago. His smoking use included cigarettes. He smoked an average of .25 packs per day. He has been exposed to tobacco smoke. He has never used smokeless tobacco. He reports current alcohol use of about 6.0 standard drinks of alcohol per week. He reports that he does not use drugs.  No Known Allergies  Family History  Problem Relation Age of Onset   Hypertension Other    Colon cancer Neg Hx    Stomach cancer Neg Hx    Esophageal cancer Neg Hx    Rectal cancer Neg Hx     Prior to Admission medications   Medication Sig Start Date End Date Taking? Authorizing Provider  amLODipine (NORVASC) 5 MG tablet Take 5 mg by mouth daily.    [provider]  lisinopril (PRINIVIL,ZESTRIL) 20 MG tablet Take 20 mg by mouth daily. 02/23/18   [provider]  loratadine (CLARITIN) 10 MG tablet Take 1 tablet (10 mg total) by mouth daily. 06/25/21   Rayna Sexton, PA-C  naproxen (NAPROSYN) 375 MG tablet Take 1 tablet (375 mg  total) by mouth 2 (two) times daily with a meal. 06/25/21   Rayna Sexton, PA-C    Physical Exam: Vitals:   08/07/21 0940 08/07/21 1000 08/07/21 1030 08/07/21 1045  BP:  126/61 127/64 127/64  Pulse:  (!) 59 (!) 107 (!) 107  Resp:    18  Temp:      TempSrc:      SpO2:  97% 100% 100%  Weight: 90.7 kg     Height: '6\' 1"'$  (1.854 m)      Physical Exam Vitals and nursing  note reviewed.  Constitutional:      General: He is awake.     Appearance: Normal appearance. He is well-developed.  HENT:     Head: Normocephalic.     Mouth/Throat:     Mouth: Mucous membranes are moist.  Eyes:     General: No scleral icterus.    Pupils: Pupils are equal, round, and reactive to light.  Neck:     Vascular: No JVD.  Cardiovascular:     Rate and Rhythm: Normal rate and regular rhythm.     Heart sounds: S1 normal and S2 normal.  Pulmonary:     Effort: Pulmonary effort is normal.     Breath sounds: No decreased breath sounds, wheezing, rhonchi or rales.  Abdominal:     General: Bowel sounds are normal. There is no abdominal bruit.     Palpations: Abdomen is soft.     Tenderness: There is no abdominal tenderness. There is no guarding or rebound.  Musculoskeletal:     Cervical back: Neck supple.     Right lower leg: No edema.     Left lower leg: No edema.  Skin:    General: Skin is warm and dry.  Neurological:     General: No focal deficit present.     Mental Status: He is alert and oriented to person, place, and time.  Psychiatric:        Mood and Affect: Mood normal.        Behavior: Behavior normal. Behavior is cooperative.     Data Reviewed:  There are no new results to review at this time.  Assessment and Plan: Principal Problem:   Esophageal mass Observation/MedSurg. Keep n.p.o. for now. GI consult appreciated. For EGD later today. Oncology has been consulted. Monitor hematocrit and hemoglobin. Pantoprazole twice daily. Follow CBC and chemistry in AM.  Active Problems:   Essential hypertension Parenteral antihypertensives as needed. Follow-up blood pressure closely.    Iron deficiency anemia due to chronic blood loss Monitor hematocrit and hemoglobin.    Dyslipidemia Also with coronary calcification/aortic atherosclerosis. Will need statin treatment to begin at some point.    GERD (gastroesophageal reflux disease) On pantoprazole IVP  twice a day.     Advance Care Planning:   Code Status: Full Code   Consults:   Family Communication:   Severity of Illness: The appropriate patient status for this patient is OBSERVATION. Observation status is judged to be reasonable and necessary in order to provide the required intensity of service to ensure the patient's safety. The patient's presenting symptoms, physical exam findings, and initial radiographic and laboratory data in the context of their medical condition is felt to place them at decreased risk for further clinical deterioration. Furthermore, it is anticipated that the patient will be medically stable for discharge from the hospital within 2 midnights of admission.   Author: Reubin Milan, MD 08/07/2021 11:35 AM  For on call review [email or web address].  This document was prepared using Dragon voice recognition software and may contain some unintended transcription errors.

## 2021-08-07 NOTE — ED Triage Notes (Addendum)
Patient c/o insomnia and trouble swallowing x 3 months. Patient states that he has seen an ENT and states that he has not been contacted for a follow up appointment.  Patient added that he has upper chest pain and has had significant weight loss.

## 2021-08-08 DIAGNOSIS — I2510 Atherosclerotic heart disease of native coronary artery without angina pectoris: Secondary | ICD-10-CM | POA: Diagnosis present

## 2021-08-08 DIAGNOSIS — Z79899 Other long term (current) drug therapy: Secondary | ICD-10-CM | POA: Diagnosis not present

## 2021-08-08 DIAGNOSIS — Z6826 Body mass index (BMI) 26.0-26.9, adult: Secondary | ICD-10-CM | POA: Diagnosis not present

## 2021-08-08 DIAGNOSIS — I700 Atherosclerosis of aorta: Secondary | ICD-10-CM | POA: Diagnosis present

## 2021-08-08 DIAGNOSIS — E440 Moderate protein-calorie malnutrition: Secondary | ICD-10-CM | POA: Diagnosis present

## 2021-08-08 DIAGNOSIS — I10 Essential (primary) hypertension: Secondary | ICD-10-CM | POA: Diagnosis present

## 2021-08-08 DIAGNOSIS — E785 Hyperlipidemia, unspecified: Secondary | ICD-10-CM | POA: Diagnosis present

## 2021-08-08 DIAGNOSIS — C7801 Secondary malignant neoplasm of right lung: Secondary | ICD-10-CM | POA: Diagnosis present

## 2021-08-08 DIAGNOSIS — R633 Feeding difficulties, unspecified: Secondary | ICD-10-CM | POA: Diagnosis present

## 2021-08-08 DIAGNOSIS — C153 Malignant neoplasm of upper third of esophagus: Secondary | ICD-10-CM | POA: Diagnosis present

## 2021-08-08 DIAGNOSIS — D500 Iron deficiency anemia secondary to blood loss (chronic): Secondary | ICD-10-CM | POA: Diagnosis present

## 2021-08-08 DIAGNOSIS — D75839 Thrombocytosis, unspecified: Secondary | ICD-10-CM | POA: Diagnosis present

## 2021-08-08 DIAGNOSIS — D630 Anemia in neoplastic disease: Secondary | ICD-10-CM | POA: Diagnosis not present

## 2021-08-08 DIAGNOSIS — R1310 Dysphagia, unspecified: Secondary | ICD-10-CM | POA: Diagnosis present

## 2021-08-08 DIAGNOSIS — J3801 Paralysis of vocal cords and larynx, unilateral: Secondary | ICD-10-CM | POA: Diagnosis present

## 2021-08-08 DIAGNOSIS — C771 Secondary and unspecified malignant neoplasm of intrathoracic lymph nodes: Secondary | ICD-10-CM | POA: Diagnosis present

## 2021-08-08 DIAGNOSIS — M7989 Other specified soft tissue disorders: Secondary | ICD-10-CM | POA: Diagnosis not present

## 2021-08-08 DIAGNOSIS — M1990 Unspecified osteoarthritis, unspecified site: Secondary | ICD-10-CM | POA: Diagnosis present

## 2021-08-08 DIAGNOSIS — C7802 Secondary malignant neoplasm of left lung: Secondary | ICD-10-CM | POA: Diagnosis present

## 2021-08-08 DIAGNOSIS — R634 Abnormal weight loss: Secondary | ICD-10-CM | POA: Diagnosis not present

## 2021-08-08 DIAGNOSIS — C159 Malignant neoplasm of esophagus, unspecified: Secondary | ICD-10-CM | POA: Diagnosis not present

## 2021-08-08 DIAGNOSIS — K2289 Other specified disease of esophagus: Secondary | ICD-10-CM | POA: Diagnosis present

## 2021-08-08 DIAGNOSIS — Z87891 Personal history of nicotine dependence: Secondary | ICD-10-CM | POA: Diagnosis not present

## 2021-08-08 DIAGNOSIS — R918 Other nonspecific abnormal finding of lung field: Secondary | ICD-10-CM | POA: Diagnosis not present

## 2021-08-08 DIAGNOSIS — K219 Gastro-esophageal reflux disease without esophagitis: Secondary | ICD-10-CM | POA: Diagnosis present

## 2021-08-08 LAB — CBC
HCT: 30.5 % — ABNORMAL LOW (ref 39.0–52.0)
Hemoglobin: 9.7 g/dL — ABNORMAL LOW (ref 13.0–17.0)
MCH: 28.2 pg (ref 26.0–34.0)
MCHC: 31.8 g/dL (ref 30.0–36.0)
MCV: 88.7 fL (ref 80.0–100.0)
Platelets: 359 10*3/uL (ref 150–400)
RBC: 3.44 MIL/uL — ABNORMAL LOW (ref 4.22–5.81)
RDW: 12.2 % (ref 11.5–15.5)
WBC: 5.9 10*3/uL (ref 4.0–10.5)
nRBC: 0 % (ref 0.0–0.2)

## 2021-08-08 LAB — BASIC METABOLIC PANEL
Anion gap: 9 (ref 5–15)
BUN: 16 mg/dL (ref 8–23)
CO2: 22 mmol/L (ref 22–32)
Calcium: 9 mg/dL (ref 8.9–10.3)
Chloride: 108 mmol/L (ref 98–111)
Creatinine, Ser: 0.98 mg/dL (ref 0.61–1.24)
GFR, Estimated: >60 mL/min (ref >60)
Glucose, Bld: 99 mg/dL (ref 70–99)
Potassium: 4 mmol/L (ref 3.5–5.1)
Sodium: 139 mmol/L (ref 135–145)

## 2021-08-08 LAB — GLUCOSE, CAPILLARY
Glucose-Capillary: 108 mg/dL — ABNORMAL HIGH (ref 70–99)
Glucose-Capillary: 97 mg/dL (ref 70–99)
Glucose-Capillary: 98 mg/dL (ref 70–99)
Glucose-Capillary: 98 mg/dL (ref 70–99)

## 2021-08-08 MED ORDER — SODIUM CHLORIDE 0.9 % IV SOLN
250.0000 mg | Freq: Once | INTRAVENOUS | Status: AC
  Administered 2021-08-08: 250 mg via INTRAVENOUS
  Filled 2021-08-08: qty 20

## 2021-08-08 MED ORDER — HYDRALAZINE HCL 20 MG/ML IJ SOLN: 5.0000 mg | Freq: Three times a day (TID) | INTRAMUSCULAR | Status: DC | PRN

## 2021-08-08 NOTE — Progress Notes (Signed)
PROGRESS NOTE    Leslie Livingston  ZOX:[id number] DOB: 19-Apr-1950 DOA: 08/07/2021 PCP: Lorene Dy, MD   Brief Narrative: 71 year old with past medical history significant for seasonal allergies, altered arthritis, hyperlipidemia, hypertension, tobacco and alcohol use presents complaining of dysphagia and odynophagia since last month, with imaging suspicious for malignant esophageal mass.  He lost about 15 pounds in the last month.  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  Bilateral pulmonary nodularity has mildly progressed over the last 9 days.  Supraclavicular and mediastinal adenopathy has not significantly changed.  No definitive metastatic disease in the abdomen or pelvis.  Mild prominent lymph nodes in the upper abdomen are unchanged from remote abdominal CT 2014, likely reactive.  Colonic diverticulosis without acute inflammation.  GI was consulted, patient underwent endoscopy 7/14, standard adult gastroscope could not pass into his very tortuous and narrow UES even after elective airway intubation for gagging and airway spasms, so a pediatric gastroscope was used to complete the exam.  Tight, 10 cm long circumferential proximal esophageal mass that extended from the UES to about 30 cm from the incisors.  The mass was friable, ulcerated and is most certainly a malignancy.     Assessment & Plan:   Principal Problem:   Esophageal mass Active Problems:   Essential hypertension   Iron deficiency anemia due to chronic blood loss   Dyslipidemia   GERD (gastroesophageal reflux disease)   1-Esophageal Mass, Dysphagia;  CT finding concerning with malignancy.  Endoscopy 7/14:  Tight, 10 cm long circumferential proximal esophageal mass that extended from the UES to about 30 cm  from the incisors.  The mass was friable, ulcerated and is most certainly a malignancy. -Surgery consulted for GT tube placement.  -Discussed with Dr Benay Spice, he agrees with [facility] placement by Surgery. Patient will require adjuvant radiation, chemotherapy.  -Continue with IV fluids.  -Radiation oncology consulted.   2-HTN; Will order PRN hydralazine.  SBP not elevated.  Holding Norvasc and Lisinopril.   Iron deficiency anemia:  Will order IV iron.   Dyslipidemia:  GERD; IV PPI.      Estimated body mass index is 26.39 kg/m as calculated from the following:   Height as of this encounter: '6\' 1"'$  (1.854 m).   Weight as of this encounter: 90.7 kg.   DVT prophylaxis: SCD Code Status: Full code Family Communication: Care discussed with patient Disposition Plan:  Status is: Observation The patient will require care spanning > 2 midnights and should be moved to inpatient because: needs G tube, nutrition,     Consultants:  General Sx Dr Benay Spice.   Procedures:    Antimicrobials:    Subjective: He denies pain. I told him, esophageal mass is likely cancer.  He lives alone.   Objective: Vitals:   08/07/21 1727 08/07/21 2125 08/08/21 0157 08/08/21 0533  BP: 131/69 126/66 115/69 115/63  Pulse: 66 (!) 58 66 (!) 55  Resp:  '12 18 16  '$ Temp: 98.7 F (37.1 C) 98.9 F (37.2 C) 98.4 F (36.9 C) 98.6 F (37 C)  TempSrc: Oral Oral Oral Oral  SpO2: 96% 96% 97% 97%  Weight:      Height:        Intake/Output Summary (Last 24 hours) at 08/08/2021 0749 Last data filed at 08/08/2021 0600 Gross per 24 hour  Intake  3025.3 ml  Output 0 ml  Net 3025.3 ml   Filed Weights   08/07/21 0940 08/07/21 1207  Weight: 90.7 kg 90.7 kg    Examination:  General exam: Appears calm and comfortable  Respiratory system: Clear to auscultation. Respiratory effort normal. Cardiovascular system: S1 & S2 heard, RRR. No JVD, murmurs, rubs, gallops or clicks. No pedal edema. Gastrointestinal  system: Abdomen is nondistended, soft and nontender. No organomegaly or masses felt. Normal bowel sounds heard. Central nervous system: Alert and oriented.  Extremities: Symmetric 5 x 5 power.    Data Reviewed: I have personally reviewed following labs and imaging studies  CBC: Recent Labs  Lab 08/07/21 0938 08/08/21 0657  WBC 7.2 5.9  NEUTROABS 4.2  --   HGB 10.7* 9.7*  HCT 33.6* 30.5*  MCV 89.1 88.7  PLT 417* 623   Basic Metabolic Panel: Recent Labs  Lab 08/07/21 0938  NA 141  K 3.8  CL 107  CO2 24  GLUCOSE 105*  BUN 18  CREATININE 1.07  CALCIUM 9.7   GFR: Estimated Creatinine Clearance: 71.6 mL/min (by C-G formula based on SCr of 1.07 mg/dL). Liver Function Tests: Recent Labs  Lab 08/07/21 0938  AST 12*  ALT 12  ALKPHOS 74  BILITOT 0.6  PROT 8.1  ALBUMIN 3.6   Recent Labs  Lab 08/07/21 0938  LIPASE 23   No results for input(s): "AMMONIA" in the last 168 hours. Coagulation Profile: No results for input(s): "INR", "PROTIME" in the last 168 hours. Cardiac Enzymes: No results for input(s): "CKTOTAL", "CKMB", "CKMBINDEX", "TROPONINI" in the last 168 hours. BNP (last 3 results) No results for input(s): "PROBNP" in the last 8760 hours. HbA1C: No results for input(s): "HGBA1C" in the last 72 hours. CBG: Recent Labs  Lab 08/07/21 2343 08/08/21 0537  GLUCAP 124* 108*   Lipid Profile: No results for input(s): "CHOL", "HDL", "LDLCALC", "TRIG", "CHOLHDL", "LDLDIRECT" in the last 72 hours. Thyroid Function Tests: No results for input(s): "TSH", "T4TOTAL", "FREET4", "T3FREE", "THYROIDAB" in the last 72 hours. Anemia Panel: Recent Labs    08/07/21 1520  FERRITIN 179  TIBC 226*  IRON 17*   Sepsis Labs: No results for input(s): "PROCALCITON", "LATICACIDVEN" in the last 168 hours.  No results found for this or any previous visit (from the past 240 hour(s)).       Radiology Studies: CT CHEST ABDOMEN PELVIS W CONTRAST  Result Date:  08/07/2021 CLINICAL DATA:  Esophageal mass.  Staging.  * Tracking Code: BO * EXAM: CT CHEST, ABDOMEN, AND PELVIS WITH CONTRAST TECHNIQUE: Multidetector CT imaging of the chest, abdomen and pelvis was performed following the standard protocol during bolus administration of intravenous contrast. RADIATION DOSE REDUCTION: This exam was performed according to the departmental dose-optimization program which includes automated exposure control, adjustment of the mA and/or kV according to patient size and/or use of iterative reconstruction technique. CONTRAST:  184m OMNIPAQUE IOHEXOL 300 MG/ML  SOLN COMPARISON:  CT neck and chest 07/29/2021. Abdominopelvic CT 06/15/2012. FINDINGS: CT CHEST FINDINGS Cardiovascular: No acute vascular findings. Three-vessel coronary artery atherosclerosis with lesser involvement of the aorta and great vessels. The heart size is normal. There is no pericardial effusion. Mediastinum/Nodes: Long segment wall thickening of the esophagus is again noted, most prominent proximally. Multiple enlarged supraclavicular and mediastinal lymph nodes are again noted bilaterally. A high right paraesophageal node measures 1.5 cm short axis on image 13/2, similar to recent prior study. Subcarinal nodes measure up to 1.3 cm short axis on image 34/2. There are  small hilar lymph nodes bilaterally. No axillary adenopathy. No abnormality of the thyroid gland or trachea seen. Lungs/Pleura: No pleural effusion or pneumothorax. Bilateral pulmonary nodularity again noted. A previously referenced nodule medially in the left lower measures 2.4 x 2.2 cm on image 125/4 (previously 2.1 cm maximally). Right middle lobe nodule currently measures 2.0 cm on image 84/4 and demonstrates central cavitation (previously 1.9 cm). Increased subsegmental atelectasis posteriorly in the left upper lobe. Musculoskeletal/Chest wall: No chest wall mass or suspicious osseous findings. Multilevel spondylosis. CT ABDOMEN AND PELVIS FINDINGS  Hepatobiliary: The liver is normal in density without suspicious focal abnormality. No evidence of gallstones, gallbladder wall thickening or biliary dilatation. Pancreas: Unremarkable. No pancreatic ductal dilatation or surrounding inflammatory changes. Spleen: Normal in size without focal abnormality. Adrenals/Urinary Tract: Both adrenal glands appear normal. 1.2 cm cyst in the posterior interpolar region of the left kidney on image 16/7, similar to remote abdominal CT; no follow-up imaging recommended. No evidence of enhancing renal mass, urinary tract calculus or hydronephrosis. The bladder is mildly distended without focal abnormality. Stomach/Bowel: No enteric contrast administered. The stomach appears unremarkable for its degree of distension. No evidence of bowel wall thickening, distention or surrounding inflammatory change. Diverticular changes throughout the colon. The appendix appears normal. Vascular/Lymphatic: Prominent lymph nodes in the upper abdomen are similar to the remote abdominal CT, including a 9 mm gastrohepatic ligament node on image 66/2 and an 8 mm portacaval node on image 69/2. No progressive abdominopelvic adenopathy identified. Aortic and branch vessel atherosclerosis without aneurysm or large vessel occlusion. Reproductive: The prostate gland and seminal vesicles appear unremarkable. Other: No ascites or peritoneal nodularity. Small umbilical hernia containing only fat. Asymmetric fat in the right inguinal canal. Musculoskeletal: No acute or significant osseous findings. Multilevel lumbar spondylosis. Moderately advanced asymmetric right hip osteoarthritis. IMPRESSION: 1. 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 Bilateral pulmonary nodularity has mildly progressed over the last 9 days. Supraclavicular and mediastinal adenopathy has not significantly changed. 2. No definite metastatic disease in the abdomen or pelvis. Mildly prominent lymph nodes in  the upper abdomen are unchanged from remote abdominal CT of 2014, likely reactive. 3. Colonic diverticulosis without acute inflammation. 4. Coronary and Aortic Atherosclerosis (ICD10-I70.0). Electronically Signed   By: Richardean Sale M.D.   On: 08/07/2021 16:30        Scheduled Meds:  pantoprazole (PROTONIX) IV  40 mg Intravenous Q12H   Continuous Infusions:  lactated ringers 100 mL/hr at 08/07/21 1838     LOS: 0 days    Time spent: 35 minutes    Jorene Kaylor A Avanelle Pixley, MD [facility]   If 7PM-7AM, please contact night-coverage [email or web address]  08/08/2021, 7:49 AM

## 2021-08-08 NOTE — Progress Notes (Signed)
1 Day Post-Op   Subjective/Chief Complaint: No complaints   Objective: Vital signs in last 24 hours: Temp:  [97.2 F (36.2 C)-98.9 F (37.2 C)] 98.6 F (37 C) (07/15 0533) Pulse Rate:  [55-107] 55 (07/15 0533) Resp:  [12-23] 16 (07/15 0533) BP: (106-152)/(61-98) 115/63 (07/15 0533) SpO2:  [92 %-100 %] 97 % (07/15 0533) Weight:  [90.7 kg] 90.7 kg (07/14 1207) Last BM Date : 08/06/21  Intake/Output from previous day: 07/14 0701 - 07/15 0700 In: 3025.3 [I.V.:2025.1; IV Piggyback:1000.2] Out: 0  Intake/Output this shift: No intake/output data recorded.  Ab soft nontender nondistended  Lab Results:  Recent Labs    08/07/21 0938 08/08/21 0657  WBC 7.2 5.9  HGB 10.7* 9.7*  HCT 33.6* 30.5*  PLT 417* 359   BMET Recent Labs    08/07/21 0938 08/08/21 0657  NA 141 139  K 3.8 4.0  CL 107 108  CO2 24 22  GLUCOSE 105* 99  BUN 18 16  CREATININE 1.07 0.98  CALCIUM 9.7 9.0   PT/INR No results for input(s): "LABPROT", "INR" in the last 72 hours. ABG No results for input(s): "PHART", "HCO3" in the last 72 hours.  Invalid input(s): "PCO2", "PO2"  Studies/Results: CT CHEST ABDOMEN PELVIS W CONTRAST  Result Date: 08/07/2021 CLINICAL DATA:  Esophageal mass.  Staging.  * Tracking Code: BO * EXAM: CT CHEST, ABDOMEN, AND PELVIS WITH CONTRAST TECHNIQUE: Multidetector CT imaging of the chest, abdomen and pelvis was performed following the standard protocol during bolus administration of intravenous contrast. RADIATION DOSE REDUCTION: This exam was performed according to the departmental dose-optimization program which includes automated exposure control, adjustment of the mA and/or kV according to patient size and/or use of iterative reconstruction technique. CONTRAST:  177m OMNIPAQUE IOHEXOL 300 MG/ML  SOLN COMPARISON:  CT neck and chest 07/29/2021. Abdominopelvic CT 06/15/2012. FINDINGS: CT CHEST FINDINGS Cardiovascular: No acute vascular findings. Three-vessel coronary artery  atherosclerosis with lesser involvement of the aorta and great vessels. The heart size is normal. There is no pericardial effusion. Mediastinum/Nodes: Long segment wall thickening of the esophagus is again noted, most prominent proximally. Multiple enlarged supraclavicular and mediastinal lymph nodes are again noted bilaterally. A high right paraesophageal node measures 1.5 cm short axis on image 13/2, similar to recent prior study. Subcarinal nodes measure up to 1.3 cm short axis on image 34/2. There are small hilar lymph nodes bilaterally. No axillary adenopathy. No abnormality of the thyroid gland or trachea seen. Lungs/Pleura: No pleural effusion or pneumothorax. Bilateral pulmonary nodularity again noted. A previously referenced nodule medially in the left lower measures 2.4 x 2.2 cm on image 125/4 (previously 2.1 cm maximally). Right middle lobe nodule currently measures 2.0 cm on image 84/4 and demonstrates central cavitation (previously 1.9 cm). Increased subsegmental atelectasis posteriorly in the left upper lobe. Musculoskeletal/Chest wall: No chest wall mass or suspicious osseous findings. Multilevel spondylosis. CT ABDOMEN AND PELVIS FINDINGS Hepatobiliary: The liver is normal in density without suspicious focal abnormality. No evidence of gallstones, gallbladder wall thickening or biliary dilatation. Pancreas: Unremarkable. No pancreatic ductal dilatation or surrounding inflammatory changes. Spleen: Normal in size without focal abnormality. Adrenals/Urinary Tract: Both adrenal glands appear normal. 1.2 cm cyst in the posterior interpolar region of the left kidney on image 16/7, similar to remote abdominal CT; no follow-up imaging recommended. No evidence of enhancing renal mass, urinary tract calculus or hydronephrosis. The bladder is mildly distended without focal abnormality. Stomach/Bowel: No enteric contrast administered. The stomach appears unremarkable for its degree of distension.  No evidence of  bowel wall thickening, distention or surrounding inflammatory change. Diverticular changes throughout the colon. The appendix appears normal. Vascular/Lymphatic: Prominent lymph nodes in the upper abdomen are similar to the remote abdominal CT, including a 9 mm gastrohepatic ligament node on image 66/2 and an 8 mm portacaval node on image 69/2. No progressive abdominopelvic adenopathy identified. Aortic and branch vessel atherosclerosis without aneurysm or large vessel occlusion. Reproductive: The prostate gland and seminal vesicles appear unremarkable. Other: No ascites or peritoneal nodularity. Small umbilical hernia containing only fat. Asymmetric fat in the right inguinal canal. Musculoskeletal: No acute or significant osseous findings. Multilevel lumbar spondylosis. Moderately advanced asymmetric right hip osteoarthritis. IMPRESSION: 1. 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 Bilateral pulmonary nodularity has mildly progressed over the last 9 days. Supraclavicular and mediastinal adenopathy has not significantly changed. 2. No definite metastatic disease in the abdomen or pelvis. Mildly prominent lymph nodes in the upper abdomen are unchanged from remote abdominal CT of 2014, likely reactive. 3. Colonic diverticulosis without acute inflammation. 4. Coronary and Aortic Atherosclerosis (ICD10-I70.0). Electronically Signed   By: Richardean Sale M.D.   On: 08/07/2021 16:30    Anti-infectives: Anti-infectives (From admission, onward)    None       Assessment/Plan: Esophageal mass Dysphagia - s/p upper endoscopy today by Dr. Ardis Hughs which revealed a tight, 10cm long, circumferential proximal esophageal mass extending from UES to about 30cm from the incisors; this mass was friable, ulcerated and is most certainly a malignancy; several biopsies were taken; the esophagus was otherwise normal to the GE junction - appears to have widespread disease - needs surgical  gastrostomy but likely needs port also. Will wait to hear from oncology on that so can do both at same time. I have sent a message to Dr Benay Spice ID - none VTE - SCDs FEN - IVF Foley - none   Rolm Bookbinder 08/08/2021

## 2021-08-09 ENCOUNTER — Encounter (HOSPITAL_COMMUNITY): Payer: Self-pay | Admitting: Gastroenterology

## 2021-08-09 DIAGNOSIS — R634 Abnormal weight loss: Secondary | ICD-10-CM | POA: Diagnosis not present

## 2021-08-09 DIAGNOSIS — R918 Other nonspecific abnormal finding of lung field: Secondary | ICD-10-CM | POA: Diagnosis not present

## 2021-08-09 DIAGNOSIS — R1310 Dysphagia, unspecified: Secondary | ICD-10-CM | POA: Diagnosis not present

## 2021-08-09 DIAGNOSIS — K2289 Other specified disease of esophagus: Secondary | ICD-10-CM | POA: Diagnosis not present

## 2021-08-09 LAB — GLUCOSE, CAPILLARY
Glucose-Capillary: 87 mg/dL (ref 70–99)
Glucose-Capillary: 87 mg/dL (ref 70–99)
Glucose-Capillary: 99 mg/dL (ref 70–99)
Glucose-Capillary: 89 mg/dL (ref 70–99)

## 2021-08-09 MED ORDER — DIPHENHYDRAMINE HCL 50 MG/ML IJ SOLN
25.0000 mg | Freq: Every evening | INTRAMUSCULAR | Status: DC | PRN
Start: 2021-08-09 — End: 2021-08-11
  Administered 2021-08-09 – 2021-08-11 (×3): 25 mg via INTRAVENOUS
  Filled 2021-08-09 (×3): qty 1

## 2021-08-09 NOTE — Progress Notes (Signed)
PROGRESS NOTE    Leslie Livingston  XNT:[id number] DOB: 04-09-1950 DOA: 08/07/2021 PCP: Lorene Dy, MD   Brief Narrative: 71 year old with past medical history significant for seasonal allergies, altered arthritis, hyperlipidemia, hypertension, tobacco and alcohol use presents complaining of dysphagia and odynophagia since last month, with imaging suspicious for malignant esophageal mass.  He lost about 15 pounds in the last month.  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  Bilateral pulmonary nodularity has mildly progressed over the last 9 days.  Supraclavicular and mediastinal adenopathy has not significantly changed.  No definitive metastatic disease in the abdomen or pelvis.  Mild prominent lymph nodes in the upper abdomen are unchanged from remote abdominal CT 2014, likely reactive.  Colonic diverticulosis without acute inflammation.  GI was consulted, patient underwent endoscopy 7/14, standard adult gastroscope could not pass into his very tortuous and narrow UES even after elective airway intubation for gagging and airway spasms, so a pediatric gastroscope was used to complete the exam.  Tight, 10 cm long circumferential proximal esophageal mass that extended from the UES to about 30 cm from the incisors.  The mass was friable, ulcerated and is most certainly a malignancy.     Assessment & Plan:   Principal Problem:   Esophageal mass Active Problems:   Essential hypertension   Iron deficiency anemia due to chronic blood loss   Dyslipidemia   GERD (gastroesophageal reflux disease)   1-Esophageal Mass, Dysphagia;  CT finding concerning with malignancy.  Endoscopy 7/14:  Tight, 10 cm long circumferential proximal esophageal mass that extended from the UES to about 30 cm  from the incisors.  The mass was friable, ulcerated and is most certainly a malignancy. -Surgery consulted for GT tube placement.  -Discussed with Dr Benay Spice, he agrees with [facility] placement by Surgery. Patient will require adjuvant radiation, chemotherapy.  -Continue with IV fluids.  -Radiation oncology consulted.   2-HTN; Will order PRN hydralazine.  SBP not elevated.  Holding Norvasc and Lisinopril.   Iron deficiency anemia:  Received IV iron.   Dyslipidemia:  GERD; IV PPI.      Estimated body mass index is 26.39 kg/m as calculated from the following:   Height as of this encounter: '6\' 1"'$  (1.854 m).   Weight as of this encounter: 90.7 kg.   DVT prophylaxis: SCD Code Status: Full code Family Communication: Care discussed with patient Disposition Plan:  Status is: Observation The patient will require care spanning > 2 midnights and should be moved to inpatient because: needs G tube, nutrition,     Consultants:  General Sx Dr Benay Spice.   Procedures:    Antimicrobials:    Subjective: He doesn't have any  complaint, other than he cant eat,   Objective: Vitals:   08/08/21 1000 08/08/21 1321 08/08/21 2104 08/09/21 0440  BP: 117/68 127/62 (!) 146/71 (!) 152/65  Pulse: (!) 54 (!) 51 (!) 56 (!) 57  Resp: '17 16 16 16  '$ Temp: 98.8 F (37.1 C) 99.7 F (37.6 C) 98.3 F (36.8 C) 98.6 F (37 C)  TempSrc: Oral Oral Oral Oral  SpO2: 96% 96% 95% 94%  Weight:      Height:        Intake/Output Summary (Last 24 hours) at 08/09/2021 0954 Last data filed at 08/09/2021 0600 Gross per 24 hour  Intake 2124.27  ml  Output --  Net 2124.27 ml    Filed Weights   08/07/21 0940 08/07/21 1207  Weight: 90.7 kg 90.7 kg    Examination:  General exam: NAD Respiratory system: CTA  CVS; S 1, S 2 RRR Gastrointestinal system: BS present, soft, nt Central nervous system: Alert, and oriented.  Extremities: no edema    Data Reviewed: I have personally reviewed following labs  and imaging studies  CBC: Recent Labs  Lab 08/07/21 0938 08/08/21 0657  WBC 7.2 5.9  NEUTROABS 4.2  --   HGB 10.7* 9.7*  HCT 33.6* 30.5*  MCV 89.1 88.7  PLT 417* 585    Basic Metabolic Panel: Recent Labs  Lab 08/07/21 0938 08/08/21 0657  NA 141 139  K 3.8 4.0  CL 107 108  CO2 24 22  GLUCOSE 105* 99  BUN 18 16  CREATININE 1.07 0.98  CALCIUM 9.7 9.0    GFR: Estimated Creatinine Clearance: 78.1 mL/min (by C-G formula based on SCr of 0.98 mg/dL). Liver Function Tests: Recent Labs  Lab 08/07/21 0938  AST 12*  ALT 12  ALKPHOS 74  BILITOT 0.6  PROT 8.1  ALBUMIN 3.6    Recent Labs  Lab 08/07/21 0938  LIPASE 23    No results for input(s): "AMMONIA" in the last 168 hours. Coagulation Profile: No results for input(s): "INR", "PROTIME" in the last 168 hours. Cardiac Enzymes: No results for input(s): "CKTOTAL", "CKMB", "CKMBINDEX", "TROPONINI" in the last 168 hours. BNP (last 3 results) No results for input(s): "PROBNP" in the last 8760 hours. HbA1C: No results for input(s): "HGBA1C" in the last 72 hours. CBG: Recent Labs  Lab 08/08/21 0537 08/08/21 1111 08/08/21 1746 08/08/21 2356 08/09/21 0438  GLUCAP 108* 98 97 98 87    Lipid Profile: No results for input(s): "CHOL", "HDL", "LDLCALC", "TRIG", "CHOLHDL", "LDLDIRECT" in the last 72 hours. Thyroid Function Tests: No results for input(s): "TSH", "T4TOTAL", "FREET4", "T3FREE", "THYROIDAB" in the last 72 hours. Anemia Panel: Recent Labs    08/07/21 1520  FERRITIN 179  TIBC 226*  IRON 17*    Sepsis Labs: No results for input(s): "PROCALCITON", "LATICACIDVEN" in the last 168 hours.  No results found for this or any previous visit (from the past 240 hour(s)).       Radiology Studies: CT CHEST ABDOMEN PELVIS W CONTRAST  Result Date: 08/07/2021 CLINICAL DATA:  Esophageal mass.  Staging.  * Tracking Code: BO * EXAM: CT CHEST, ABDOMEN, AND PELVIS WITH CONTRAST TECHNIQUE: Multidetector CT imaging  of the chest, abdomen and pelvis was performed following the standard protocol during bolus administration of intravenous contrast. RADIATION DOSE REDUCTION: This exam was performed according to the departmental dose-optimization program which includes automated exposure control, adjustment of the mA and/or kV according to patient size and/or use of iterative reconstruction technique. CONTRAST:  183m OMNIPAQUE IOHEXOL 300 MG/ML  SOLN COMPARISON:  CT neck and chest 07/29/2021. Abdominopelvic CT 06/15/2012. FINDINGS: CT CHEST FINDINGS Cardiovascular: No acute vascular findings. Three-vessel coronary artery atherosclerosis with lesser involvement of the aorta and great vessels. The heart size is normal. There is no pericardial effusion. Mediastinum/Nodes: Long segment wall thickening of the esophagus is again noted, most prominent proximally. Multiple enlarged supraclavicular and mediastinal lymph nodes are again noted bilaterally. A high right paraesophageal node measures 1.5 cm short axis on image 13/2, similar to recent prior study. Subcarinal nodes measure up to 1.3 cm short axis on image 34/2. There are small hilar lymph nodes bilaterally. No axillary adenopathy. No  abnormality of the thyroid gland or trachea seen. Lungs/Pleura: No pleural effusion or pneumothorax. Bilateral pulmonary nodularity again noted. A previously referenced nodule medially in the left lower measures 2.4 x 2.2 cm on image 125/4 (previously 2.1 cm maximally). Right middle lobe nodule currently measures 2.0 cm on image 84/4 and demonstrates central cavitation (previously 1.9 cm). Increased subsegmental atelectasis posteriorly in the left upper lobe. Musculoskeletal/Chest wall: No chest wall mass or suspicious osseous findings. Multilevel spondylosis. CT ABDOMEN AND PELVIS FINDINGS Hepatobiliary: The liver is normal in density without suspicious focal abnormality. No evidence of gallstones, gallbladder wall thickening or biliary dilatation.  Pancreas: Unremarkable. No pancreatic ductal dilatation or surrounding inflammatory changes. Spleen: Normal in size without focal abnormality. Adrenals/Urinary Tract: Both adrenal glands appear normal. 1.2 cm cyst in the posterior interpolar region of the left kidney on image 16/7, similar to remote abdominal CT; no follow-up imaging recommended. No evidence of enhancing renal mass, urinary tract calculus or hydronephrosis. The bladder is mildly distended without focal abnormality. Stomach/Bowel: No enteric contrast administered. The stomach appears unremarkable for its degree of distension. No evidence of bowel wall thickening, distention or surrounding inflammatory change. Diverticular changes throughout the colon. The appendix appears normal. Vascular/Lymphatic: Prominent lymph nodes in the upper abdomen are similar to the remote abdominal CT, including a 9 mm gastrohepatic ligament node on image 66/2 and an 8 mm portacaval node on image 69/2. No progressive abdominopelvic adenopathy identified. Aortic and branch vessel atherosclerosis without aneurysm or large vessel occlusion. Reproductive: The prostate gland and seminal vesicles appear unremarkable. Other: No ascites or peritoneal nodularity. Small umbilical hernia containing only fat. Asymmetric fat in the right inguinal canal. Musculoskeletal: No acute or significant osseous findings. Multilevel lumbar spondylosis. Moderately advanced asymmetric right hip osteoarthritis. IMPRESSION: 1. 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 Bilateral pulmonary nodularity has mildly progressed over the last 9 days. Supraclavicular and mediastinal adenopathy has not significantly changed. 2. No definite metastatic disease in the abdomen or pelvis. Mildly prominent lymph nodes in the upper abdomen are unchanged from remote abdominal CT of 2014, likely reactive. 3. Colonic diverticulosis without acute inflammation. 4. Coronary and Aortic  Atherosclerosis (ICD10-I70.0). Electronically Signed   By: Richardean Sale M.D.   On: 08/07/2021 16:30        Scheduled Meds:  pantoprazole (PROTONIX) IV  40 mg Intravenous Q12H   Continuous Infusions:  lactated ringers 100 mL/hr at 08/08/21 1326     LOS: 1 day    Time spent: 35 minutes    Pernella Ackerley A Diamante Truszkowski, MD [facility]   If 7PM-7AM, please contact night-coverage [email or web address]  08/09/2021, 9:54 AM

## 2021-08-09 NOTE — Evaluation (Signed)
Physical Therapy Evaluation Patient Details Name: Leslie Livingston MRN: [id number] DOB: 27-Aug-1950 Today's Date: 08/09/2021  History of Present Illness  Pt admitted from home with dysphagia ad odynophagia 2* esophageal mass.  Pt with hx of essential hypertension and iron deficiency anemia  Clinical Impression  Pt admitted as above and currently demonstrating Modified Independence with all basic mobility tasks including ambulating 400' in hall including stepping fwd, bkwd, sidestep and turning 360 degrees.  Pt reports just feeling weak and tired from lack of sleep and food but otherwise at or near baseline for mobility .  Pt agrees no PT needs at this time and PT service will sign off.     Recommendations for follow up therapy are one component of a multi-disciplinary discharge planning process, led by the attending physician.  Recommendations may be updated based on patient status, additional functional criteria and insurance authorization.  Follow Up Recommendations No PT follow up      Assistance Recommended at Discharge None  Patient can return home with the following       Equipment Recommendations None recommended by PT  Recommendations for Other Services       Functional Status Assessment Patient has not had a recent decline in their functional status     Precautions / Restrictions Precautions Precautions: Fall Restrictions Weight Bearing Restrictions: No      Mobility  Bed Mobility Overal bed mobility: Modified Independent             General bed mobility comments: no physical assist    Transfers Overall transfer level: Modified independent Equipment used: None               General transfer comment: no physical assist    Ambulation/Gait Ambulation/Gait assistance: Modified independent (Device/Increase time) Gait Distance (Feet): 400 Feet Assistive device: Quad cane Gait Pattern/deviations: Step-through pattern, WFL(Within Functional  Limits) Gait velocity: mod pace     General Gait Details: mildly antalgic gait with pt reporting long-term R hip and knee discomfort  Stairs            Wheelchair Mobility    Modified Rankin (Stroke Patients Only)       Balance Overall balance assessment: Mild deficits observed, not formally tested                                           Pertinent Vitals/Pain Pain Assessment Pain Assessment: No/denies pain    Home Living Family/patient expects to be discharged to:: Private residence Living Arrangements: Alone Available Help at Discharge: Family;Friend(s);Available PRN/intermittently Type of Home: House Home Access: Stairs to enter Entrance Stairs-Rails: Right Entrance Stairs-Number of Steps: "a few"   Home Layout: One level Home Equipment: Cane - quad      Prior Function Prior Level of Function : Independent/Modified Independent             Mobility Comments: using SBQC for distance only 2* long-term R hip and knee discomfort ADLs Comments: Pt reports no difficulty with ADL prior to admit     Hand Dominance        Extremity/Trunk Assessment   Upper Extremity Assessment Upper Extremity Assessment: Overall WFL for tasks assessed    Lower Extremity Assessment Lower Extremity Assessment: Overall WFL for tasks assessed    Cervical / Trunk Assessment Cervical / Trunk Assessment: Normal  Communication   Communication: No difficulties  Cognition Arousal/Alertness:  Awake/alert Behavior During Therapy: WFL for tasks assessed/performed Overall Cognitive Status: Within Functional Limits for tasks assessed                                          General Comments      Exercises     Assessment/Plan    PT Assessment Patient does not need any further PT services  PT Problem List         PT Treatment Interventions      PT Goals (Current goals can be found in the Care Plan section)  Acute Rehab PT  Goals Patient Stated Goal: Be able to eat and sleep again PT Goal Formulation: All assessment and education complete, DC therapy    Frequency       Co-evaluation               AM-PAC PT "6 Clicks" Mobility  Outcome Measure Help needed turning from your back to your side while in a flat bed without using bedrails?: None Help needed moving from lying on your back to sitting on the side of a flat bed without using bedrails?: None Help needed moving to and from a bed to a chair (including a wheelchair)?: None Help needed standing up from a chair using your arms (e.g., wheelchair or bedside chair)?: None Help needed to walk in hospital room?: None Help needed climbing 3-5 steps with a railing? : None 6 Click Score: 24    End of Session Equipment Utilized During Treatment: Gait belt Activity Tolerance: Patient tolerated treatment well Patient left: in bed;with call bell/phone within reach Nurse Communication: Mobility status PT Visit Diagnosis: Difficulty in walking, not elsewhere classified (R26.2)    Time: 5625-6389 PT Time Calculation (min) (ACUTE ONLY): 20 min   Charges:   PT Evaluation $PT Eval Low Complexity: 1 Low          [place] Pager [phone] Office [phone]   Leslie Livingston 08/09/2021, 10:58 AM

## 2021-08-09 NOTE — Progress Notes (Addendum)
IP PROGRESS NOTE  Subjective:   He continues to have dysphagia/odynophagia.  Objective: Vital signs in last 24 hours: Blood pressure (!) 152/65, pulse (!) 57, temperature 98.6 F (37 C), temperature source Oral, resp. rate 16, height '6\' 1"'$  (1.854 m), weight 200 lb (90.7 kg), SpO2 94 %.  Intake/Output from previous day: 07/15 0701 - 07/16 0700 In: 2124.3 [I.V.:2034.1; IV Piggyback:90.2] Out: -   Physical Exam:  HEENT: No thrush Abdomen: Nontender, no hepatosplenomegaly Extremities: No leg edema  Lab Results: Recent Labs    08/07/21 0938 08/08/21 0657  WBC 7.2 5.9  HGB 10.7* 9.7*  HCT 33.6* 30.5*  PLT 417* 359    BMET Recent Labs    08/07/21 0938 08/08/21 0657  NA 141 139  K 3.8 4.0  CL 107 108  CO2 24 22  GLUCOSE 105* 99  BUN 18 16  CREATININE 1.07 0.98  CALCIUM 9.7 9.0    No results found for: "CEA1", "CEA", "EUM353", "CA125"  Studies/Results: CT CHEST ABDOMEN PELVIS W CONTRAST  Result Date: 08/07/2021 CLINICAL DATA:  Esophageal mass.  Staging.  * Tracking Code: BO * EXAM: CT CHEST, ABDOMEN, AND PELVIS WITH CONTRAST TECHNIQUE: Multidetector CT imaging of the chest, abdomen and pelvis was performed following the standard protocol during bolus administration of intravenous contrast. RADIATION DOSE REDUCTION: This exam was performed according to the departmental dose-optimization program which includes automated exposure control, adjustment of the mA and/or kV according to patient size and/or use of iterative reconstruction technique. CONTRAST:  17m OMNIPAQUE IOHEXOL 300 MG/ML  SOLN COMPARISON:  CT neck and chest 07/29/2021. Abdominopelvic CT 06/15/2012. FINDINGS: CT CHEST FINDINGS Cardiovascular: No acute vascular findings. Three-vessel coronary artery atherosclerosis with lesser involvement of the aorta and great vessels. The heart size is normal. There is no pericardial effusion. Mediastinum/Nodes: Long segment wall thickening of the esophagus is again noted,  most prominent proximally. Multiple enlarged supraclavicular and mediastinal lymph nodes are again noted bilaterally. A high right paraesophageal node measures 1.5 cm short axis on image 13/2, similar to recent prior study. Subcarinal nodes measure up to 1.3 cm short axis on image 34/2. There are small hilar lymph nodes bilaterally. No axillary adenopathy. No abnormality of the thyroid gland or trachea seen. Lungs/Pleura: No pleural effusion or pneumothorax. Bilateral pulmonary nodularity again noted. A previously referenced nodule medially in the left lower measures 2.4 x 2.2 cm on image 125/4 (previously 2.1 cm maximally). Right middle lobe nodule currently measures 2.0 cm on image 84/4 and demonstrates central cavitation (previously 1.9 cm). Increased subsegmental atelectasis posteriorly in the left upper lobe. Musculoskeletal/Chest wall: No chest wall mass or suspicious osseous findings. Multilevel spondylosis. CT ABDOMEN AND PELVIS FINDINGS Hepatobiliary: The liver is normal in density without suspicious focal abnormality. No evidence of gallstones, gallbladder wall thickening or biliary dilatation. Pancreas: Unremarkable. No pancreatic ductal dilatation or surrounding inflammatory changes. Spleen: Normal in size without focal abnormality. Adrenals/Urinary Tract: Both adrenal glands appear normal. 1.2 cm cyst in the posterior interpolar region of the left kidney on image 16/7, similar to remote abdominal CT; no follow-up imaging recommended. No evidence of enhancing renal mass, urinary tract calculus or hydronephrosis. The bladder is mildly distended without focal abnormality. Stomach/Bowel: No enteric contrast administered. The stomach appears unremarkable for its degree of distension. No evidence of bowel wall thickening, distention or surrounding inflammatory change. Diverticular changes throughout the colon. The appendix appears normal. Vascular/Lymphatic: Prominent lymph nodes in the upper abdomen are  similar to the remote abdominal CT, including a 9  mm gastrohepatic ligament node on image 66/2 and an 8 mm portacaval node on image 69/2. No progressive abdominopelvic adenopathy identified. Aortic and branch vessel atherosclerosis without aneurysm or large vessel occlusion. Reproductive: The prostate gland and seminal vesicles appear unremarkable. Other: No ascites or peritoneal nodularity. Small umbilical hernia containing only fat. Asymmetric fat in the right inguinal canal. Musculoskeletal: No acute or significant osseous findings. Multilevel lumbar spondylosis. Moderately advanced asymmetric right hip osteoarthritis. IMPRESSION: 1. 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 Bilateral pulmonary nodularity has mildly progressed over the last 9 days. Supraclavicular and mediastinal adenopathy has not significantly changed. 2. No definite metastatic disease in the abdomen or pelvis. Mildly prominent lymph nodes in the upper abdomen are unchanged from remote abdominal CT of 2014, likely reactive. 3. Colonic diverticulosis without acute inflammation. 4. Coronary and Aortic Atherosclerosis (ICD10-I70.0). Electronically Signed   By: Richardean Sale M.D.   On: 08/07/2021 16:30    Medications: I have reviewed the patient's current medications.  Assessment/plan:  Esophagus mass with chest lymphadenopathy and lung nodules CT neck 07/29/2021-adenopathy at the thoracic inlet and lower bilateral jugular chains, esophagus mass, left vocal cord paralysis CT chest 07/29/2021-diffuse esophageal wall thickening, most pronounced in the proximal esophagus, multiple pulmonary nodules, mediastinal and supraclavicular lymphadenopathy Upper endoscopy 08/07/2021-mass/stricture extending from the UES to 30 cm CTs chest, abdomen, and pelvis 08/07/2021-long segment wall thickening of the esophagus, prominent proximally, multiple large supraclavicular mediastinal nodes, bilateral lung nodules,, and  upper abdominal nodes similar to remote abdominal CT   Solid/liquid dysphagia secondary to #1 Weight loss secondary to #1 Odynophagia secondary to #1 History of colon polyps-tubular adenomas History of a diverticular bleed-April 2020 Anemia Thrombocytosis  Mr. Kishi has severe dysphagia secondary to an obstructing proximal esophagus mass.  He appears to have metastatic disease involving neck/chest lymph nodes and the lungs.  I discussed gastrostomy tube and Port-A-Cath placement with him.  He agrees to proceed.  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  He understands the need for systemic therapy.  I will coordinate a treatment plan with the radiation oncology team.  We will consider a course of pelvic radiation for dysphagia to be followed by systemic therapy.  I will follow-up on the pathology from the EGD biopsy.   LOS: 1 day   Betsy Coder, MD   08/09/2021, 8:07 AM

## 2021-08-09 NOTE — Progress Notes (Signed)
OT Cancellation Note  Patient Details Name: Leslie Livingston MRN: [id number] DOB: 01-18-1951   Cancelled Treatment:    Reason Eval/Treat Not Completed: OT screened, no needs identified, will sign off. Patient independent with ADLs.  [name] 08/09/2021, 10:13 AM

## 2021-08-09 NOTE — Progress Notes (Signed)
2 Days Post-Op   Subjective/Chief Complaint: No complaints   Objective: Vital signs in last 24 hours: Temp:  [98.3 F (36.8 C)-99.7 F (37.6 C)] 98.6 F (37 C) (07/16 0440) Pulse Rate:  [51-57] 57 (07/16 0440) Resp:  [16-17] 16 (07/16 0440) BP: (117-152)/(62-71) 152/65 (07/16 0440) SpO2:  [94 %-96 %] 94 % (07/16 0440) Last BM Date : 08/06/21  Intake/Output from previous day: 07/15 0701 - 07/16 0700 In: 2124.3 [I.V.:2034.1; IV Piggyback:90.2] Out: -  Intake/Output this shift: No intake/output data recorded.  Abd soft nontender  Lab Results:  Recent Labs    08/07/21 0938 08/08/21 0657  WBC 7.2 5.9  HGB 10.7* 9.7*  HCT 33.6* 30.5*  PLT 417* 359   BMET Recent Labs    08/07/21 0938 08/08/21 0657  NA 141 139  K 3.8 4.0  CL 107 108  CO2 24 22  GLUCOSE 105* 99  BUN 18 16  CREATININE 1.07 0.98  CALCIUM 9.7 9.0   PT/INR No results for input(s): "LABPROT", "INR" in the last 72 hours. ABG No results for input(s): "PHART", "HCO3" in the last 72 hours.  Invalid input(s): "PCO2", "PO2"  Studies/Results: CT CHEST ABDOMEN PELVIS W CONTRAST  Result Date: 08/07/2021 CLINICAL DATA:  Esophageal mass.  Staging.  * Tracking Code: BO * EXAM: CT CHEST, ABDOMEN, AND PELVIS WITH CONTRAST TECHNIQUE: Multidetector CT imaging of the chest, abdomen and pelvis was performed following the standard protocol during bolus administration of intravenous contrast. RADIATION DOSE REDUCTION: This exam was performed according to the departmental dose-optimization program which includes automated exposure control, adjustment of the mA and/or kV according to patient size and/or use of iterative reconstruction technique. CONTRAST:  147m OMNIPAQUE IOHEXOL 300 MG/ML  SOLN COMPARISON:  CT neck and chest 07/29/2021. Abdominopelvic CT 06/15/2012. FINDINGS: CT CHEST FINDINGS Cardiovascular: No acute vascular findings. Three-vessel coronary artery atherosclerosis with lesser involvement of the aorta and  great vessels. The heart size is normal. There is no pericardial effusion. Mediastinum/Nodes: Long segment wall thickening of the esophagus is again noted, most prominent proximally. Multiple enlarged supraclavicular and mediastinal lymph nodes are again noted bilaterally. A high right paraesophageal node measures 1.5 cm short axis on image 13/2, similar to recent prior study. Subcarinal nodes measure up to 1.3 cm short axis on image 34/2. There are small hilar lymph nodes bilaterally. No axillary adenopathy. No abnormality of the thyroid gland or trachea seen. Lungs/Pleura: No pleural effusion or pneumothorax. Bilateral pulmonary nodularity again noted. A previously referenced nodule medially in the left lower measures 2.4 x 2.2 cm on image 125/4 (previously 2.1 cm maximally). Right middle lobe nodule currently measures 2.0 cm on image 84/4 and demonstrates central cavitation (previously 1.9 cm). Increased subsegmental atelectasis posteriorly in the left upper lobe. Musculoskeletal/Chest wall: No chest wall mass or suspicious osseous findings. Multilevel spondylosis. CT ABDOMEN AND PELVIS FINDINGS Hepatobiliary: The liver is normal in density without suspicious focal abnormality. No evidence of gallstones, gallbladder wall thickening or biliary dilatation. Pancreas: Unremarkable. No pancreatic ductal dilatation or surrounding inflammatory changes. Spleen: Normal in size without focal abnormality. Adrenals/Urinary Tract: Both adrenal glands appear normal. 1.2 cm cyst in the posterior interpolar region of the left kidney on image 16/7, similar to remote abdominal CT; no follow-up imaging recommended. No evidence of enhancing renal mass, urinary tract calculus or hydronephrosis. The bladder is mildly distended without focal abnormality. Stomach/Bowel: No enteric contrast administered. The stomach appears unremarkable for its degree of distension. No evidence of bowel wall thickening, distention or surrounding  inflammatory change. Diverticular changes throughout the colon. The appendix appears normal. Vascular/Lymphatic: Prominent lymph nodes in the upper abdomen are similar to the remote abdominal CT, including a 9 mm gastrohepatic ligament node on image 66/2 and an 8 mm portacaval node on image 69/2. No progressive abdominopelvic adenopathy identified. Aortic and branch vessel atherosclerosis without aneurysm or large vessel occlusion. Reproductive: The prostate gland and seminal vesicles appear unremarkable. Other: No ascites or peritoneal nodularity. Small umbilical hernia containing only fat. Asymmetric fat in the right inguinal canal. Musculoskeletal: No acute or significant osseous findings. Multilevel lumbar spondylosis. Moderately advanced asymmetric right hip osteoarthritis. IMPRESSION: 1. 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 Bilateral pulmonary nodularity has mildly progressed over the last 9 days. Supraclavicular and mediastinal adenopathy has not significantly changed. 2. No definite metastatic disease in the abdomen or pelvis. Mildly prominent lymph nodes in the upper abdomen are unchanged from remote abdominal CT of 2014, likely reactive. 3. Colonic diverticulosis without acute inflammation. 4. Coronary and Aortic Atherosclerosis (ICD10-I70.0). Electronically Signed   By: Richardean Sale M.D.   On: 08/07/2021 16:30    Anti-infectives: Anti-infectives (From admission, onward)    None       Assessment/Plan: Esophageal mass Dysphagia - s/p upper endoscopy by Dr. Ardis Hughs which revealed a tight, 10cm long, circumferential proximal esophageal mass extending from UES to about 30cm from the incisors; this mass was friable, ulcerated and is most certainly a malignancy; several biopsies were taken; the esophagus was otherwise normal to the GE junction - appears to have widespread disease - plan for port and g tube with Dr Ninfa Linden tomorrow ID - none VTE -  SCDs FEN - IVF Foley - none I discussed plan with he and his daughter  I reviewed Consultant oncology notes, hospitalist notes, last 24 h vitals and pain scores, last 48 h intake and output, last 24 h labs and trends, and last 24 h imaging results.  This care required moderate level of medical decision making.   Rolm Bookbinder 08/09/2021

## 2021-08-10 ENCOUNTER — Encounter (HOSPITAL_COMMUNITY)
Admission: EM | Disposition: A | Discharge status: Home Health | Payer: Self-pay | Source: Home / Self Care | Attending: Internal Medicine

## 2021-08-10 ENCOUNTER — Encounter: Payer: Self-pay | Admitting: *Deleted

## 2021-08-10 ENCOUNTER — Telehealth: Payer: Self-pay | Admitting: *Deleted

## 2021-08-10 ENCOUNTER — Inpatient Hospital Stay (HOSPITAL_COMMUNITY): Payer: Medicare PPO | Admitting: Certified Registered Nurse Anesthetist

## 2021-08-10 ENCOUNTER — Inpatient Hospital Stay (HOSPITAL_COMMUNITY): Payer: Medicare PPO

## 2021-08-10 ENCOUNTER — Other Ambulatory Visit: Payer: Self-pay

## 2021-08-10 ENCOUNTER — Encounter (HOSPITAL_COMMUNITY): Payer: Self-pay | Admitting: *Deleted

## 2021-08-10 DIAGNOSIS — D630 Anemia in neoplastic disease: Secondary | ICD-10-CM | POA: Diagnosis not present

## 2021-08-10 DIAGNOSIS — Z87891 Personal history of nicotine dependence: Secondary | ICD-10-CM

## 2021-08-10 DIAGNOSIS — C159 Malignant neoplasm of esophagus, unspecified: Secondary | ICD-10-CM | POA: Diagnosis not present

## 2021-08-10 DIAGNOSIS — I10 Essential (primary) hypertension: Secondary | ICD-10-CM

## 2021-08-10 DIAGNOSIS — K2289 Other specified disease of esophagus: Secondary | ICD-10-CM | POA: Diagnosis not present

## 2021-08-10 HISTORY — PX: GASTROSTOMY: SHX5249

## 2021-08-10 HISTORY — PX: PORTACATH PLACEMENT: SHX2246

## 2021-08-10 LAB — CBC
HCT: 30.5 % — ABNORMAL LOW (ref 39.0–52.0)
Hemoglobin: 9.6 g/dL — ABNORMAL LOW (ref 13.0–17.0)
MCH: 27.8 pg (ref 26.0–34.0)
MCHC: 31.5 g/dL (ref 30.0–36.0)
MCV: 88.4 fL (ref 80.0–100.0)
Platelets: 353 10*3/uL (ref 150–400)
RBC: 3.45 MIL/uL — ABNORMAL LOW (ref 4.22–5.81)
RDW: 12.2 % (ref 11.5–15.5)
WBC: 7.5 10*3/uL (ref 4.0–10.5)
nRBC: 0 % (ref 0.0–0.2)

## 2021-08-10 LAB — SURGICAL PCR SCREEN
MRSA, PCR: NEGATIVE
Staphylococcus aureus: POSITIVE — AB

## 2021-08-10 LAB — BASIC METABOLIC PANEL
Anion gap: 7 (ref 5–15)
BUN: 12 mg/dL (ref 8–23)
CO2: 24 mmol/L (ref 22–32)
Calcium: 9 mg/dL (ref 8.9–10.3)
Chloride: 107 mmol/L (ref 98–111)
Creatinine, Ser: 0.98 mg/dL (ref 0.61–1.24)
GFR, Estimated: >60 mL/min (ref >60)
Glucose, Bld: 85 mg/dL (ref 70–99)
Potassium: 3.6 mmol/L (ref 3.5–5.1)
Sodium: 138 mmol/L (ref 135–145)

## 2021-08-10 LAB — GLUCOSE, CAPILLARY
Glucose-Capillary: 94 mg/dL (ref 70–99)
Glucose-Capillary: 101 mg/dL — ABNORMAL HIGH (ref 70–99)
Glucose-Capillary: 114 mg/dL — ABNORMAL HIGH (ref 70–99)

## 2021-08-10 LAB — SURGICAL PATHOLOGY

## 2021-08-10 SURGERY — INSERTION, TUNNELED CENTRAL VENOUS DEVICE, WITH PORT
Anesthesia: General | Site: Neck

## 2021-08-10 MED ORDER — LACTATED RINGERS IV SOLN: INTRAVENOUS | Status: DC

## 2021-08-10 MED ORDER — HEPARIN 6000 UNIT IRRIGATION SOLUTION
Freq: Once | Status: AC
  Administered 2021-08-10: 1
  Filled 2021-08-10: qty 6000

## 2021-08-10 MED ORDER — ROCURONIUM BROMIDE 10 MG/ML (PF) SYRINGE
PREFILLED_SYRINGE | INTRAVENOUS | Status: AC
  Filled 2021-08-10: qty 10

## 2021-08-10 MED ORDER — PROPOFOL 10 MG/ML IV BOLUS
INTRAVENOUS | Status: AC
  Filled 2021-08-10: qty 20

## 2021-08-10 MED ORDER — FENTANYL CITRATE (PF) 250 MCG/5ML IJ SOLN
INTRAMUSCULAR | Status: DC | PRN
Start: 2021-08-10 — End: 2021-08-10
  Administered 2021-08-10: 50 ug via INTRAVENOUS
  Administered 2021-08-10: 25 ug via INTRAVENOUS
  Administered 2021-08-10: 50 ug via INTRAVENOUS
  Administered 2021-08-10: 25 ug via INTRAVENOUS
  Administered 2021-08-10 (×2): 50 ug via INTRAVENOUS

## 2021-08-10 MED ORDER — ONDANSETRON HCL 4 MG/2ML IJ SOLN
INTRAMUSCULAR | Status: AC
  Filled 2021-08-10: qty 2

## 2021-08-10 MED ORDER — SUCCINYLCHOLINE CHLORIDE 200 MG/10ML IV SOSY
PREFILLED_SYRINGE | INTRAVENOUS | Status: DC | PRN
  Administered 2021-08-10: 100 mg via INTRAVENOUS

## 2021-08-10 MED ORDER — MUPIROCIN 2 % EX OINT
1.0000 | TOPICAL_OINTMENT | Freq: Two times a day (BID) | CUTANEOUS | Status: AC
  Administered 2021-08-10 – 2021-08-14 (×9): 1 via NASAL
  Filled 2021-08-10 (×3): qty 22

## 2021-08-10 MED ORDER — SUCCINYLCHOLINE CHLORIDE 200 MG/10ML IV SOSY
PREFILLED_SYRINGE | INTRAVENOUS | Status: AC
Start: 2021-08-10 — End: ?
  Filled 2021-08-10: qty 10

## 2021-08-10 MED ORDER — MIDAZOLAM HCL 2 MG/2ML IJ SOLN
INTRAMUSCULAR | Status: DC | PRN
  Administered 2021-08-10: 2 mg via INTRAVENOUS

## 2021-08-10 MED ORDER — HEPARIN SOD (PORK) LOCK FLUSH 100 UNIT/ML IV SOLN
INTRAVENOUS | Status: AC
Start: 2021-08-10 — End: ?
  Filled 2021-08-10: qty 5

## 2021-08-10 MED ORDER — BUPIVACAINE HCL (PF) 0.5 % IJ SOLN
INTRAMUSCULAR | Status: AC
  Filled 2021-08-10: qty 30

## 2021-08-10 MED ORDER — PHENYLEPHRINE 80 MCG/ML (10ML) SYRINGE FOR IV PUSH (FOR BLOOD PRESSURE SUPPORT)
PREFILLED_SYRINGE | INTRAVENOUS | Status: DC | PRN
  Administered 2021-08-10: 80 ug via INTRAVENOUS

## 2021-08-10 MED ORDER — HEPARIN SOD (PORK) LOCK FLUSH 100 UNIT/ML IV SOLN
INTRAVENOUS | Status: DC | PRN
  Administered 2021-08-10: 500 [IU]

## 2021-08-10 MED ORDER — HYDROMORPHONE HCL 1 MG/ML IJ SOLN
1.0000 mg | INTRAMUSCULAR | Status: DC | PRN
  Administered 2021-08-10 – 2021-08-16 (×16): 1 mg via INTRAVENOUS
  Filled 2021-08-10 (×17): qty 1

## 2021-08-10 MED ORDER — SODIUM CHLORIDE 0.9 % IR SOLN
Status: DC | PRN
  Administered 2021-08-10: 1000 mL

## 2021-08-10 MED ORDER — KETOROLAC TROMETHAMINE 30 MG/ML IJ SOLN
INTRAMUSCULAR | Status: DC | PRN
  Administered 2021-08-10: 30 mg via INTRAVENOUS

## 2021-08-10 MED ORDER — CHLORHEXIDINE GLUCONATE 0.12 % MT SOLN
15.0000 mL | Freq: Once | OROMUCOSAL | Status: AC
  Administered 2021-08-10: 15 mL via OROMUCOSAL

## 2021-08-10 MED ORDER — PROPOFOL 10 MG/ML IV BOLUS
INTRAVENOUS | Status: DC | PRN
  Administered 2021-08-10: 160 mg via INTRAVENOUS

## 2021-08-10 MED ORDER — LIDOCAINE HCL (PF) 2 % IJ SOLN
INTRAMUSCULAR | Status: AC
  Filled 2021-08-10: qty 5

## 2021-08-10 MED ORDER — DEXAMETHASONE SODIUM PHOSPHATE 10 MG/ML IJ SOLN
INTRAMUSCULAR | Status: AC
  Filled 2021-08-10: qty 1

## 2021-08-10 MED ORDER — PHENYLEPHRINE 80 MCG/ML (10ML) SYRINGE FOR IV PUSH (FOR BLOOD PRESSURE SUPPORT)
PREFILLED_SYRINGE | INTRAVENOUS | Status: AC
Start: 2021-08-10 — End: ?
  Filled 2021-08-10: qty 10

## 2021-08-10 MED ORDER — CEFAZOLIN SODIUM-DEXTROSE 2-4 GM/100ML-% IV SOLN
2.0000 g | Freq: Once | INTRAVENOUS | Status: AC
  Administered 2021-08-10: 2 g via INTRAVENOUS
  Filled 2021-08-10: qty 100

## 2021-08-10 MED ORDER — DEXAMETHASONE SODIUM PHOSPHATE 10 MG/ML IJ SOLN
INTRAMUSCULAR | Status: DC | PRN
  Administered 2021-08-10: 8 mg via INTRAVENOUS

## 2021-08-10 MED ORDER — BUPIVACAINE HCL (PF) 0.5 % IJ SOLN
INTRAMUSCULAR | Status: DC | PRN
  Administered 2021-08-10: 30 mL

## 2021-08-10 MED ORDER — FENTANYL CITRATE (PF) 250 MCG/5ML IJ SOLN
INTRAMUSCULAR | Status: AC
  Filled 2021-08-10: qty 5

## 2021-08-10 MED ORDER — LIDOCAINE HCL (CARDIAC) PF 100 MG/5ML IV SOSY
PREFILLED_SYRINGE | INTRAVENOUS | Status: DC | PRN
  Administered 2021-08-10: 60 mg via INTRAVENOUS

## 2021-08-10 MED ORDER — ONDANSETRON HCL 4 MG/2ML IJ SOLN
INTRAMUSCULAR | Status: DC | PRN
  Administered 2021-08-10: 4 mg via INTRAVENOUS

## 2021-08-10 MED ORDER — SUGAMMADEX SODIUM 200 MG/2ML IV SOLN
INTRAVENOUS | Status: DC | PRN
  Administered 2021-08-10: 200 mg via INTRAVENOUS

## 2021-08-10 MED ORDER — MIDAZOLAM HCL 2 MG/2ML IJ SOLN
INTRAMUSCULAR | Status: AC
  Filled 2021-08-10: qty 2

## 2021-08-10 MED ORDER — ORAL CARE MOUTH RINSE
15.0000 mL | Freq: Once | OROMUCOSAL | Status: AC
Start: 2021-08-10 — End: 2021-08-10

## 2021-08-10 MED ORDER — ROCURONIUM BROMIDE 10 MG/ML (PF) SYRINGE
PREFILLED_SYRINGE | INTRAVENOUS | Status: DC | PRN
  Administered 2021-08-10: 40 mg via INTRAVENOUS

## 2021-08-10 SURGICAL SUPPLY — 78 items
ADH SKN CLS APL DERMABOND .7 (GAUZE/BANDAGES/DRESSINGS) ×2
APL SKNCLS STERI-STRIP NONHPOA (GAUZE/BANDAGES/DRESSINGS)
BAG COUNTER SPONGE SURGICOUNT (BAG) IMPLANT
BAG DECANTER FOR FLEXI CONT (MISCELLANEOUS) ×4 IMPLANT
BAG SPNG CNTER NS LX DISP (BAG)
BENZOIN TINCTURE PRP APPL 2/3 (GAUZE/BANDAGES/DRESSINGS) IMPLANT
BLADE EXTENDED COATED 6.5IN (ELECTRODE) ×4 IMPLANT
BLADE HEX COATED 2.75 (ELECTRODE) ×4 IMPLANT
BLADE SURG 15 STRL LF DISP TIS (BLADE) ×3 IMPLANT
BLADE SURG 15 STRL SS (BLADE) ×3
BLADE SURG SZ11 CARB STEEL (BLADE) ×4 IMPLANT
CATH BOLUS GASTRO 22FR (CATHETERS) ×1 IMPLANT
CATH FOLEY 2WAY SLVR  5CC 18FR (CATHETERS)
CATH FOLEY 2WAY SLVR  5CC 22FR (CATHETERS)
CATH FOLEY 2WAY SLVR 30CC 20FR (CATHETERS) IMPLANT
CATH FOLEY 2WAY SLVR 30CC 22FR (CATHETERS) IMPLANT
CATH FOLEY 2WAY SLVR 5CC 18FR (CATHETERS) IMPLANT
CATH FOLEY 2WAY SLVR 5CC 22FR (CATHETERS) IMPLANT
CLIP TI LARGE 6 (CLIP) IMPLANT
COVER MAYO STAND STRL (DRAPES) ×4 IMPLANT
COVER SURGICAL LIGHT HANDLE (MISCELLANEOUS) ×4 IMPLANT
DERMABOND ADVANCED (GAUZE/BANDAGES/DRESSINGS) ×3
DERMABOND ADVANCED .7 DNX12 (GAUZE/BANDAGES/DRESSINGS) IMPLANT
DRAPE C-ARM 42X120 X-RAY (DRAPES) ×4 IMPLANT
DRAPE LAPAROSCOPIC ABDOMINAL (DRAPES) ×4 IMPLANT
DRSG OPSITE POSTOP 4X6 (GAUZE/BANDAGES/DRESSINGS) ×1 IMPLANT
DRSG TEGADERM 2-3/8X2-3/4 SM (GAUZE/BANDAGES/DRESSINGS) IMPLANT
DRSG TEGADERM 4X4.75 (GAUZE/BANDAGES/DRESSINGS) IMPLANT
ELECT REM PT RETURN 15FT ADLT (MISCELLANEOUS) ×4 IMPLANT
GAUZE 4X4 16PLY ~~LOC~~+RFID DBL (SPONGE) ×4 IMPLANT
GAUZE SPONGE 4X4 12PLY STRL (GAUZE/BANDAGES/DRESSINGS) IMPLANT
GLOVE BIO SURGEON STRL SZ7 (GLOVE) ×12 IMPLANT
GLOVE BIO SURGEON STRL SZ7.5 (GLOVE) ×4 IMPLANT
GLOVE BIOGEL PI IND STRL 7.0 (GLOVE) ×3 IMPLANT
GLOVE BIOGEL PI IND STRL 8 (GLOVE) ×3 IMPLANT
GLOVE BIOGEL PI INDICATOR 7.0 (GLOVE) ×3
GLOVE BIOGEL PI INDICATOR 8 (GLOVE) ×3
GLOVE BIOGEL PI ORTHO PRO 7.5 (GLOVE) ×3
GLOVE PI ORTHO PRO STRL 7.5 (GLOVE) ×3 IMPLANT
GOWN STRL REUS W/ TWL XL LVL3 (GOWN DISPOSABLE) ×6 IMPLANT
GOWN STRL REUS W/TWL XL LVL3 (GOWN DISPOSABLE) ×6
HANDLE SUCTION POOLE (INSTRUMENTS) ×3 IMPLANT
KIT BASIN OR (CUSTOM PROCEDURE TRAY) ×4 IMPLANT
KIT PORT POWER 8FR ISP CVUE (Port) ×1 IMPLANT
KIT TURNOVER KIT A (KITS) IMPLANT
NDL HYPO 25X1 1.5 SAFETY (NEEDLE) ×3 IMPLANT
NEEDLE HYPO 25X1 1.5 SAFETY (NEEDLE) ×3 IMPLANT
NS IRRIG 1000ML POUR BTL (IV SOLUTION) ×4 IMPLANT
PACK BASIC VI WITH GOWN DISP (CUSTOM PROCEDURE TRAY) ×4 IMPLANT
PACK GENERAL/GYN (CUSTOM PROCEDURE TRAY) ×4 IMPLANT
PENCIL SMOKE EVACUATOR (MISCELLANEOUS) IMPLANT
SPIKE FLUID TRANSFER (MISCELLANEOUS) ×4 IMPLANT
STAPLER VISISTAT 35W (STAPLE) ×4 IMPLANT
STRIP CLOSURE SKIN 1/2X4 (GAUZE/BANDAGES/DRESSINGS) IMPLANT
SUCTION POOLE HANDLE (INSTRUMENTS) ×3
SUT MNCRL AB 4-0 PS2 18 (SUTURE) ×4 IMPLANT
SUT NOV 1 T60/GS (SUTURE) IMPLANT
SUT NOVA T20/GS 25 (SUTURE) IMPLANT
SUT PROLENE 2 0 SH DA (SUTURE) ×4 IMPLANT
SUT SILK 2 0 (SUTURE) ×3
SUT SILK 2 0 SH CR/8 (SUTURE) ×4 IMPLANT
SUT SILK 2-0 18XBRD TIE 12 (SUTURE) ×3 IMPLANT
SUT SILK 2-0 30XBRD TIE 12 (SUTURE) IMPLANT
SUT SILK 3 0 (SUTURE)
SUT SILK 3 0 SH CR/8 (SUTURE) IMPLANT
SUT SILK 3-0 18XBRD TIE 12 (SUTURE) IMPLANT
SUT VIC AB 2-0 SH 18 (SUTURE) IMPLANT
SUT VIC AB 3-0 SH 18 (SUTURE) ×1 IMPLANT
SUT VIC AB 3-0 SH 27 (SUTURE) ×3
SUT VIC AB 3-0 SH 27XBRD (SUTURE) ×3 IMPLANT
SUT VICRYL 2 0 18  UND BR (SUTURE)
SUT VICRYL 2 0 18 UND BR (SUTURE) IMPLANT
SUT VICRYL 3 0 BR 18  UND (SUTURE)
SUT VICRYL 3 0 BR 18 UND (SUTURE) IMPLANT
SYR 10ML LL (SYRINGE) ×4 IMPLANT
SYR CONTROL 10ML LL (SYRINGE) ×4 IMPLANT
TOWEL OR 17X26 10 PK STRL BLUE (TOWEL DISPOSABLE) ×4 IMPLANT
TRAY FOLEY MTR SLVR 16FR STAT (SET/KITS/TRAYS/PACK) ×4 IMPLANT

## 2021-08-10 NOTE — Progress Notes (Signed)
Initial Nutrition Assessment  DOCUMENTATION CODES:   Non-severe (moderate) malnutrition in context of acute illness/injury  INTERVENTION:  - once able to use G-tube, recommend Osmolite 1.5 @ 25 ml/hr to advance by 10 ml every 12 hours to reach goal rate of 55 ml/hr with 45 ml Prosource TF TID and 100 ml free water every 4 hours.  - at goal rate, this regimen will provide 2100 kcal, 116 grams protein, and 1606 ml free water.  Monitor magnesium, potassium, and phosphorus BID for at least 3 days, MD to replete as needed, as pt is at risk for refeeding syndrome given moderate malnutrition and inadequate nutrition for at least 2 months.    NUTRITION DIAGNOSIS:   Moderate Malnutrition related to acute illness (newly dx malignancy) as evidenced by mild fat depletion, mild muscle depletion.  GOAL:   Patient will meet greater than or equal to 90% of their needs  MONITOR:   TF tolerance, Labs, Weight trends  REASON FOR ASSESSMENT:   Malnutrition Screening Tool, Consult Assessment of nutrition requirement/status  ASSESSMENT:   71 year old male with medical history of seasonal allergies, altered arthritis, HLD, HTN, and tobacco and alcohol use. He presented to the ED due to dysphagia and odynophagia since June with imaging suspicious for malignant esophageal mass. 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 Bilateral pulmonary nodularity has mildly progressed over the last 9 days. Colonic diverticulosis without acute inflammation. GI was consulted and he underwent endoscopy on 7/14. Patient was admitted d/t esophageal mass.  Patient laying in bed with no visitors present at the time of RD visit. Patient shares that symptoms have been worsening over the past 2 months. He shares that he was  progressively unable to swallow foods and eventually even liquids. Unable, even with several attempts, to get a clearer picture of the progressive time line.   Patient denies having oral or abdominal pain at any point. He underwent port placement and insertion of G-tube by General Surgery today. He denies abdominal discomfort at this time following that. He states that his esophagus is feeling better and that his voice quality is beginning to improve.  Patient has a cane at bedside and shares that he does use it nearly every time he ambulates d/t arthritis.   Discussed plan for TF initiation on 7/18 and what advancement will look like.  Weight today is 200 lb and weight on 5/17 was 225 lb. This indicates 25 lb weight loss (11% body weight) in the past 2 months.    Labs reviewed; CBGs: 94 and 101 mg/dl. Medications reviewed; 40 mg IV protonix BID. IVF; LR @ 100 ml/hr.     NUTRITION - FOCUSED PHYSICAL EXAM:  Flowsheet Row Most Recent Value  Orbital Region Mild depletion  Upper Arm Region Moderate depletion  Thoracic and Lumbar Region Unable to assess  Buccal Region Mild depletion  Temple Region Mild depletion  Clavicle Bone Region Mild depletion  Clavicle and Acromion Bone Region Mild depletion  Scapular Bone Region Mild depletion  Dorsal Hand Mild depletion  Patellar Region Mild depletion  Anterior Thigh Region Mild depletion  Posterior Calf Region Moderate depletion  Edema (RD Assessment) None  Hair Reviewed  Eyes Reviewed  Mouth Reviewed  Skin Reviewed  Nails Reviewed       Diet Order:   Diet Order  Diet NPO time specified Except for: Other (See Comments)  Diet effective now                   EDUCATION NEEDS:   Education needs have been addressed  Skin:  Skin Assessment: Skin Integrity Issues: Skin Integrity Issues:: Incisions Incisions: neck and abdomen (7/17)  Last BM:  PTA/unknown  Height:   Ht Readings from Last 1 Encounters:   08/10/21 '6\' 1"'$  (1.854 m)    Weight:   Wt Readings from Last 1 Encounters:  08/10/21 90.7 kg     BMI:  Body mass index is 26.39 kg/m.  Estimated Nutritional Needs:  Kcal:  2100-2400 kcal Protein:  105-125 grams Fluid:  >/= 2.3 L/day     Jarome Matin, MS, RD, LDN, CNSC Registered Dietitian II Inpatient Clinical Nutrition RD pager # and on-call/weekend pager # available in [facility]

## 2021-08-10 NOTE — Progress Notes (Signed)
Patient ID: Leslie Livingston, male   DOB: September 01, 1950, 71 y.o.   MRN: [id number]   Pre Procedure note for inpatients:   Leslie Livingston has been scheduled for Procedure(s): INSERTION PORT-A-CATH (N/A) INSERTION OF GASTROSTOMY TUBE (N/A) today. The various methods of treatment have been discussed with the patient. After consideration of the risks, benefits and treatment options the patient has consented to the planned procedure.   The patient has been seen and labs reviewed. There are no changes in the patient's condition to prevent proceeding with the planned procedure today.  Recent labs:  Lab Results  Component Value Date   WBC 7.5 08/10/2021   HGB 9.6 (L) 08/10/2021   HCT 30.5 (L) 08/10/2021   PLT 353 08/10/2021   GLUCOSE 85 08/10/2021   CHOL 193 05/02/2007   TRIG 113 05/02/2007   HDL 46.2 05/02/2007   LDLCALC 124 (H) 05/02/2007   ALT 12 08/07/2021   AST 12 (L) 08/07/2021   NA 138 08/10/2021   K 3.6 08/10/2021   CL 107 08/10/2021   CREATININE 0.98 08/10/2021   BUN 12 08/10/2021   CO2 24 08/10/2021   TSH 1.59 05/02/2007   PSA 0.57 05/02/2007   INR 1.0 05/02/2018    Coralie Keens, MD 08/10/2021 8:51 AM

## 2021-08-10 NOTE — Anesthesia Preprocedure Evaluation (Signed)
Anesthesia Evaluation  Patient identified by MRN, date of birth, ID band Patient awake    Reviewed: Allergy & Precautions, NPO status , Patient's Chart, lab work & pertinent test results  Airway Mallampati: II  TM Distance: >3 FB     Dental   Pulmonary former smoker,    breath sounds clear to auscultation       Cardiovascular hypertension, Pt. on medications  Rhythm:Regular Rate:Normal     Neuro/Psych    GI/Hepatic Neg liver ROS, GERD  ,Esophageal CA    Endo/Other  negative endocrine ROS  Renal/GU negative Renal ROS     Musculoskeletal  (+) Arthritis ,   Abdominal   Peds  Hematology  (+) Blood dyscrasia, anemia ,   Anesthesia Other Findings   Reproductive/Obstetrics                             Lab Results  Component Value Date   WBC 7.5 08/10/2021   HGB 9.6 (L) 08/10/2021   HCT 30.5 (L) 08/10/2021   MCV 88.4 08/10/2021   PLT 353 08/10/2021   Lab Results  Component Value Date   CREATININE 0.98 08/10/2021   BUN 12 08/10/2021   NA 138 08/10/2021   K 3.6 08/10/2021   CL 107 08/10/2021   CO2 24 08/10/2021    Anesthesia Physical  Anesthesia Plan  ASA: 3  Anesthesia Plan: General   Post-op Pain Management: Ofirmev IV (intra-op)* and Toradol IV (intra-op)*   Induction: Intravenous  PONV Risk Score and Plan: 2 and Treatment may vary due to age or medical condition, Ondansetron and Dexamethasone  Airway Management Planned: Oral ETT  Additional Equipment:   Intra-op Plan:   Post-operative Plan: Extubation in OR  Informed Consent: I have reviewed the patients History and Physical, chart, labs and discussed the procedure including the risks, benefits and alternatives for the proposed anesthesia with the patient or authorized representative who has indicated his/her understanding and acceptance.     Dental advisory given  Plan Discussed with: CRNA and  Anesthesiologist  Anesthesia Plan Comments:         Anesthesia Quick Evaluation

## 2021-08-10 NOTE — Anesthesia Procedure Notes (Signed)
Procedure Name: Intubation Date/Time: 08/10/2021 9:53 AM  Performed by: Raenette Rover, CRNAPre-anesthesia Checklist: Patient identified, Emergency Drugs available, Suction available and Patient being monitored Patient Re-evaluated:Patient Re-evaluated prior to induction Oxygen Delivery Method: Circle system utilized Preoxygenation: Pre-oxygenation with 100% oxygen Induction Type: IV induction Ventilation: Mask ventilation without difficulty Laryngoscope Size: Mac and 4 Grade View: Grade I Tube type: Oral Tube size: 7.5 mm Number of attempts: 1 Airway Equipment and Method: Stylet Placement Confirmation: ETT inserted through vocal cords under direct vision, positive ETCO2 and breath sounds checked- equal and bilateral Secured at: 23 cm Tube secured with: Tape Dental Injury: Teeth and Oropharynx as per pre-operative assessment

## 2021-08-10 NOTE — Plan of Care (Signed)

## 2021-08-10 NOTE — Progress Notes (Signed)
Oncology Discharge Planning Note  [facility] [place] Address: [address] [place], [place], [place] [address] Hours of Operation:  Nena Polio, Monday - Friday  Clinic Contact Information:  [phone]) [phone]  Oncology Care Team: Medical Oncologist:  Benay Spice  Patient Details: Name:  Leslie Livingston, Leslie Livingston MRN:   [id number] DOB:   [birth year] Reason for Current Admission: '@PPROB'$ @  Discharge Planning Narrative: Notification of admission received by Dr Benay Spice for [facility].  Discharge follow-up appointments for oncology are current and available on the AVS and MyChart.   Upon discharge from the hospital, hematology/oncology's post discharge plan of care for the outpatient setting is: Appointment with Ned Card NP and Dr Benay Spice on 08/17/21 0915 at [place], [place] [address] [phone]   Zaydenn Balaguer will be called within two business days after discharge to review hematology/oncology's plan of care for full understanding.    Outpatient Oncology Specific Care Only: Oncology appointment transportation needs addressed?:  no Oncology medication management for symptom management addressed?:  no Chemo Alert Card reviewed?:  no Immunotherapy Alert Card reviewed?:  no

## 2021-08-10 NOTE — Op Note (Signed)
INSERTION PORT-A-CATH, INSERTION OF GASTROSTOMY TUBE  Procedure Note  Leslie Livingston 08/07/2021 - 08/10/2021   Pre-op Diagnosis: ESOPHAGEAL CANCER     Post-op Diagnosis: same  Procedure(s): INSERTION PORT-A-CATH LEFT SUBCLAVIAN VEIN (8 FR) INSERTION OF GASTROSTOMY TUBE (22 FR)  Surgeon(s): Leslie Keens, MD  Assist: Dr. Clerance Lav, [facility] Resident  Anesthesia: General  Staff:  Circulator: Gillis Santa, RN Radiology Technologist: Leanor Rubenstein Scrub Person: Jeanne Ivan Float Surgical Tech: Sanda Klein  Estimated Blood Loss: Minimal  Indications: This is a 71 year old gentleman who presents with a large esophageal mass.  Malignancy is suspected with metastatic disease.  The decision was made to proceed with a Port-A-Cath insertion for IV chemotherapy and a gastrostomy tube for nutrition.  Procedure: The patient brought to the operating room identifies correct patient.  He was placed upon the operating table general anesthesia was induced.  His left chest and neck were then prepped and draped in usual sterile fashion.  Using the introducer needle I cannulated the left subclavian vein.  I then passed the wire through the needle into the central venous system under direct fluoroscopy.  I then made incision at the wire site with a scalpel and created a pocket for the port.  An 8 French [place] port was brought to the field.  It was flushed appropriately.  The catheter was attached to the port.  I then fed the introducer sheath and dilator over the wire and into the central venous system.  I then removed the dilator and wire.  The port was placed into the pocket.  It was cut an appropriate length.  It was then fed down the peel-away sheath into the central venous system.  The sheath was peeled away.  I accessed the port and good flush and return were demonstrated.  Fluoroscopy confirmed placement in the superior vena cava.  The port was  then sewn to the chest wall with a 3-0 Prolene suture.  It was then instilled with concentrated heparin solution.  Subcutaneous tissue was then closed with interrupted 3-0 Vicryl sutures and the skin was closed with a running 4-0 Monocryl.  Dermabond was then applied  Next the patient's abdomen was prepped and draped in usual sterile fashion.  We created a midline incision with a scalpel in the upper abdomen.  This was taken down through the subcutaneous tissue and fascia with electrocautery.  The peritoneum was then opened entire length of the incision.  The stomach was identified and grasped with Babcock clamps.  We will place with 2-0 silk pursing suture on the anterior wall of the stomach.  A skin incision was made in the left upper quadrant and a 22 French gastrostomy tube was passed through this into the abdominal cavity.  We made an opening in the stomach with a cautery and then placed the balloontipped catheter through the opening into the stomach after the balloon was tested.  The balloon was then insufflated.  The pursing suture was tied in place around the ostomy tube.  We then secured the stomach to the abdominal wall circumferentially around the tube site with interrupted 2-0 silk sutures and the balloon was pulled up against the abdominal wall securing the stomach in place.  The tube was secured to the chest wall with several interrupted 2-0 nylon sutures.  The patient's midline fascia was closed with #1 PDS suture.  The fascia was irrigated with saline.  It was then anesthetized Marcaine.  The skin was then closed  with skin staples.  The patient tolerated the procedure well.  All the counts were correct at the end the procedure.  Dressings were applied.  The patient was then extubated in the operating room and taken in stable condition to the recovery room.                         Leslie Livingston   Date: 08/10/2021  Time: 11:26 AM

## 2021-08-10 NOTE — Progress Notes (Signed)
PROGRESS NOTE    Leslie Livingston  FOY:[id number] DOB: 03-02-1950 DOA: 08/07/2021 PCP: Lorene Dy, MD   Brief Narrative: 71 year old with past medical history significant for seasonal allergies, altered arthritis, hyperlipidemia, hypertension, tobacco and alcohol use presents complaining of dysphagia and odynophagia since last month, with imaging suspicious for malignant esophageal mass.  He lost about 15 pounds in the last month.  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  Bilateral pulmonary nodularity has mildly progressed over the last 9 days.  Supraclavicular and mediastinal adenopathy has not significantly changed.  No definitive metastatic disease in the abdomen or pelvis.  Mild prominent lymph nodes in the upper abdomen are unchanged from remote abdominal CT 2014, likely reactive.  Colonic diverticulosis without acute inflammation.  GI was consulted, patient underwent endoscopy 7/14, standard adult gastroscope could not pass into his very tortuous and narrow UES even after elective airway intubation for gagging and airway spasms, so a pediatric gastroscope was used to complete the exam.  Tight, 10 cm long circumferential proximal esophageal mass that extended from the UES to about 30 cm from the incisors.  The mass was friable, ulcerated and is most certainly a malignancy.     Assessment & Plan:   Principal Problem:   Esophageal mass Active Problems:   Essential hypertension   Iron deficiency anemia due to chronic blood loss   Dyslipidemia   GERD (gastroesophageal reflux disease)   1-Esophageal Mass, Dysphagia;  CT finding concerning with malignancy.  Endoscopy 7/14:  Tight, 10 cm long circumferential proximal esophageal mass that extended from the UES to about 30 cm  from the incisors.  The mass was friable, ulcerated and is most certainly a malignancy. -Surgery consulted for GT tube placement.  -Discussed with Dr Benay Spice, Patient will require adjuvant radiation, chemotherapy.  -Continue with IV fluids.  -Radiation oncology consulted.  -Biopsy results pending.  Underwent G tube placement 7/17. Hopefully can start nutrition tomorrow.  S/p Port catheter placement 7/17.  2-HTN; on  PRN hydralazine.  SBP not elevated.  Holding Norvasc and Lisinopril.   Iron deficiency anemia:  Received IV iron.  Monitor hb.   Dyslipidemia:  GERD; IV PPI.      Estimated body mass index is 26.39 kg/m as calculated from the following:   Height as of this encounter: '6\' 1"'$  (1.854 m).   Weight as of this encounter: 90.7 kg.   DVT prophylaxis: SCD Code Status: Full code Family Communication: Care discussed with patient Disposition Plan:  Status is: Observation The patient will require care spanning > 2 midnights and should be moved to inpatient because: needs G tube, nutrition,     Consultants:  General Sx Dr Benay Spice.   Procedures:    Antimicrobials:    Subjective: He came from procedure. He denies pain.   Objective: Vitals:   08/10/21 1200 08/10/21 1215 08/10/21 1233 08/10/21 1324  BP: (!) 155/76  135/78 130/77  Pulse: (!) 52 (!) 52 (!) 53 (!) 58  Resp: '15 11 16 16  '$ Temp:  97.7 F (36.5 C) 97.8 F (36.6 C) 98.6 F (37 C)  TempSrc:   Oral Oral  SpO2: 99% 100% 95% 100%  Weight:      Height:        Intake/Output Summary (Last 24 hours) at 08/10/2021 1419 Last data filed at 08/10/2021  1400 Gross per 24 hour  Intake 3175.69 ml  Output 270 ml  Net 2905.69 ml    Filed Weights   08/07/21 0940 08/07/21 1207 08/10/21 0845  Weight: 90.7 kg 90.7 kg 90.7 kg    Examination:  General exam: NAD Respiratory system: CTA CVS; S 1, S 2 RRR Gastrointestinal system: BS present, soft, G tube in place.  Central nervous system: Alert, and  oriented Extremities: no edema    Data Reviewed: I have personally reviewed following labs and imaging studies  CBC: Recent Labs  Lab 08/07/21 0938 08/08/21 0657 08/10/21 0416  WBC 7.2 5.9 7.5  NEUTROABS 4.2  --   --   HGB 10.7* 9.7* 9.6*  HCT 33.6* 30.5* 30.5*  MCV 89.1 88.7 88.4  PLT 417* 359 384    Basic Metabolic Panel: Recent Labs  Lab 08/07/21 0938 08/08/21 0657 08/10/21 0416  NA 141 139 138  K 3.8 4.0 3.6  CL 107 108 107  CO2 '24 22 24  '$ GLUCOSE 105* 99 85  BUN '18 16 12  '$ CREATININE 1.07 0.98 0.98  CALCIUM 9.7 9.0 9.0    GFR: Estimated Creatinine Clearance: 78.1 mL/min (by C-G formula based on SCr of 0.98 mg/dL). Liver Function Tests: Recent Labs  Lab 08/07/21 0938  AST 12*  ALT 12  ALKPHOS 74  BILITOT 0.6  PROT 8.1  ALBUMIN 3.6    Recent Labs  Lab 08/07/21 0938  LIPASE 23    No results for input(s): "AMMONIA" in the last 168 hours. Coagulation Profile: No results for input(s): "INR", "PROTIME" in the last 168 hours. Cardiac Enzymes: No results for input(s): "CKTOTAL", "CKMB", "CKMBINDEX", "TROPONINI" in the last 168 hours. BNP (last 3 results) No results for input(s): "PROBNP" in the last 8760 hours. HbA1C: No results for input(s): "HGBA1C" in the last 72 hours. CBG: Recent Labs  Lab 08/09/21 1123 08/09/21 1718 08/09/21 2311 08/10/21 0540 08/10/21 1246  GLUCAP 87 99 89 94 101*    Lipid Profile: No results for input(s): "CHOL", "HDL", "LDLCALC", "TRIG", "CHOLHDL", "LDLDIRECT" in the last 72 hours. Thyroid Function Tests: No results for input(s): "TSH", "T4TOTAL", "FREET4", "T3FREE", "THYROIDAB" in the last 72 hours. Anemia Panel: Recent Labs    08/07/21 1520  FERRITIN 179  TIBC 226*  IRON 17*    Sepsis Labs: No results for input(s): "PROCALCITON", "LATICACIDVEN" in the last 168 hours.  Recent Results (from the past 240 hour(s))  Surgical PCR screen     Status: Abnormal   Collection Time: 08/10/21  7:44 AM   Specimen:  Nasal Mucosa; Nasal Swab  Result Value Ref Range Status   MRSA, PCR NEGATIVE NEGATIVE Final   Staphylococcus aureus POSITIVE (A) NEGATIVE Final    Comment: (NOTE) The Xpert SA Assay (FDA approved for NASAL specimens in patients 34 years of age and older), is one component of a comprehensive surveillance program. It is not intended to diagnose infection nor to guide or monitor treatment. Performed at [facility], [place] [address], [place], [place] [address]          Radiology Studies: DG C-Arm 1-60 Min-No Report  Result Date: 08/10/2021 Fluoroscopy was utilized by the requesting physician.  No radiographic interpretation.        Scheduled Meds:  mupirocin ointment  1 Application Nasal BID   pantoprazole (PROTONIX) IV  40 mg Intravenous Q12H   Continuous Infusions:  lactated ringers 100 mL/hr at 08/09/21 2112     LOS: 2 days    Time spent:  35 minutes    Elmarie Shiley, MD [facility]   If 7PM-7AM, please contact night-coverage [email or web address]  08/10/2021, 2:19 PM

## 2021-08-10 NOTE — Transfer of Care (Signed)
Immediate Anesthesia Transfer of Care Note  Patient: Leslie Livingston  Procedure(s) Performed: INSERTION PORT-A-CATH (Neck) INSERTION OF GASTROSTOMY TUBE (Abdomen)  Patient Location: PACU  Anesthesia Type:General  Level of Consciousness: awake, alert , oriented, drowsy and patient cooperative  Airway & Oxygen Therapy: Patient Spontanous Breathing and Patient connected to face mask oxygen  Post-op Assessment: Report given to RN and Post -op Vital signs reviewed and stable  Post vital signs: Reviewed and stable  Last Vitals:  Vitals Value Taken Time  BP 142/64 08/10/21 1134  Temp    Pulse 58 08/10/21 1137  Resp 19 08/10/21 1137  SpO2 97 % 08/10/21 1137  Vitals shown include unvalidated device data.  Last Pain:  Vitals:   08/10/21 0845  TempSrc: Oral  PainSc: 0-No pain      Patients Stated Pain Goal: 3 (45/40/98 1191)  Complications: No notable events documented.

## 2021-08-11 ENCOUNTER — Encounter: Payer: Self-pay | Admitting: *Deleted

## 2021-08-11 ENCOUNTER — Encounter (HOSPITAL_COMMUNITY): Payer: Self-pay | Admitting: Surgery

## 2021-08-11 ENCOUNTER — Ambulatory Visit
Admit: 2021-08-11 | Discharge: 2021-08-11 | Disposition: A | Discharge status: Home / Self Care | Payer: Medicare PPO | Source: Ambulatory Visit | Attending: Radiation Oncology | Admitting: Radiation Oncology

## 2021-08-11 DIAGNOSIS — R1310 Dysphagia, unspecified: Secondary | ICD-10-CM

## 2021-08-11 DIAGNOSIS — C159 Malignant neoplasm of esophagus, unspecified: Secondary | ICD-10-CM | POA: Diagnosis present

## 2021-08-11 DIAGNOSIS — E440 Moderate protein-calorie malnutrition: Secondary | ICD-10-CM | POA: Insufficient documentation

## 2021-08-11 DIAGNOSIS — K2289 Other specified disease of esophagus: Secondary | ICD-10-CM | POA: Diagnosis not present

## 2021-08-11 DIAGNOSIS — C153 Malignant neoplasm of upper third of esophagus: Secondary | ICD-10-CM

## 2021-08-11 LAB — CBC
HCT: 31.3 % — ABNORMAL LOW (ref 39.0–52.0)
Hemoglobin: 10 g/dL — ABNORMAL LOW (ref 13.0–17.0)
MCH: 28.4 pg (ref 26.0–34.0)
MCHC: 31.9 g/dL (ref 30.0–36.0)
MCV: 88.9 fL (ref 80.0–100.0)
Platelets: 371 10*3/uL (ref 150–400)
RBC: 3.52 MIL/uL — ABNORMAL LOW (ref 4.22–5.81)
RDW: 12.1 % (ref 11.5–15.5)
WBC: 8.7 10*3/uL (ref 4.0–10.5)
nRBC: 0 % (ref 0.0–0.2)

## 2021-08-11 LAB — BASIC METABOLIC PANEL
Anion gap: 10 (ref 5–15)
BUN: 14 mg/dL (ref 8–23)
CO2: 25 mmol/L (ref 22–32)
Calcium: 9.1 mg/dL (ref 8.9–10.3)
Chloride: 104 mmol/L (ref 98–111)
Creatinine, Ser: 1.03 mg/dL (ref 0.61–1.24)
GFR, Estimated: >60 mL/min (ref >60)
Glucose, Bld: 97 mg/dL (ref 70–99)
Potassium: 3.8 mmol/L (ref 3.5–5.1)
Sodium: 139 mmol/L (ref 135–145)

## 2021-08-11 LAB — GLUCOSE, CAPILLARY
Glucose-Capillary: 103 mg/dL — ABNORMAL HIGH (ref 70–99)
Glucose-Capillary: 105 mg/dL — ABNORMAL HIGH (ref 70–99)
Glucose-Capillary: 90 mg/dL (ref 70–99)
Glucose-Capillary: 93 mg/dL (ref 70–99)
Glucose-Capillary: 99 mg/dL (ref 70–99)

## 2021-08-11 LAB — MAGNESIUM
Magnesium: 2 mg/dL (ref 1.7–2.4)
Magnesium: 1.8 mg/dL (ref 1.7–2.4)

## 2021-08-11 LAB — PHOSPHORUS: Phosphorus: 2.3 mg/dL — ABNORMAL LOW (ref 2.5–4.6)

## 2021-08-11 MED ORDER — FREE WATER
150.0000 mL | Status: DC
  Administered 2021-08-11 – 2021-08-16 (×31): 150 mL

## 2021-08-11 MED ORDER — VITAL HIGH PROTEIN PO LIQD: 1000.0000 mL | ORAL | Status: DC

## 2021-08-11 MED ORDER — MELATONIN 3 MG PO TABS
3.0000 mg | ORAL_TABLET | Freq: Every day | ORAL | Status: DC
  Administered 2021-08-11 – 2021-08-13 (×3): 3 mg via ORAL
  Filled 2021-08-11 (×3): qty 1

## 2021-08-11 MED ORDER — PROSOURCE TF PO LIQD
45.0000 mL | Freq: Three times a day (TID) | ORAL | Status: DC
Start: 2021-08-11 — End: 2021-08-16
  Administered 2021-08-11 – 2021-08-16 (×16): 45 mL
  Filled 2021-08-11 (×2): qty 45
  Filled 2021-08-11: qty 405
  Filled 2021-08-11 (×15): qty 45

## 2021-08-11 MED ORDER — AMLODIPINE BESYLATE 5 MG PO TABS
5.0000 mg | ORAL_TABLET | Freq: Every day | ORAL | Status: DC
  Administered 2021-08-11 – 2021-08-16 (×6): 5 mg via ORAL
  Filled 2021-08-11 (×6): qty 1

## 2021-08-11 MED ORDER — OSMOLITE 1.5 CAL PO LIQD
1000.0000 mL | ORAL | Status: DC
  Administered 2021-08-11 – 2021-08-15 (×4): 1000 mL
  Filled 2021-08-11 (×4): qty 1000
  Filled 2021-08-11: qty 4000
  Filled 2021-08-11 (×5): qty 1000

## 2021-08-11 NOTE — Care Management Important Message (Signed)
Important Message  Patient Details IM Letter given to the Patient. Name: Leslie Livingston MRN: [id number] Date of Birth: February 19, 1950   Medicare Important Message Given:  Yes     Kerin Salen 08/11/2021, 1:39 PM

## 2021-08-11 NOTE — Progress Notes (Signed)
Nutrition Follow-up  DOCUMENTATION CODES:   Non-severe (moderate) malnutrition in context of acute illness/injury  INTERVENTION:  - will order Osmolite 1.5 @ 25 ml/hr to advance by 10 ml every 12 hours to reach goal rate of 55 ml/hr with 45 ml Prosource TF TID and 150 ml free water every 4 hours via G-tube.  - at goal rate, this regimen will provide 2100 kcal, 116 grams protein, and 1906 ml free water.  - as TF rate is advancing and patient is noted to be tolerating and labs are well managed, will transition to bolus regimen.   - monitor magnesium, potassium, and phosphorus BID for at least 3 days, MD to replete as needed, as pt is at risk for refeeding syndrome given moderate malnutrition and inadequate nutrition for at least 2 months.    NUTRITION DIAGNOSIS:   Moderate Malnutrition related to acute illness (newly dx malignancy) as evidenced by mild fat depletion, mild muscle depletion. -ongoing  GOAL:   Patient will meet greater than or equal to 90% of their needs -unable to meet with TF not yet started  MONITOR:   TF tolerance, Labs, Weight trends  REASON FOR ASSESSMENT:   Consult Enteral/tube feeding initiation and management  ASSESSMENT:   71 year old male with medical history of seasonal allergies, altered arthritis, HLD, HTN, and tobacco and alcohol use. He presented to the ED due to dysphagia and odynophagia since June with imaging suspicious for malignant esophageal mass. 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 Bilateral pulmonary nodularity has mildly progressed over the last 9 days. Colonic diverticulosis without acute inflammation. GI was consulted and he underwent endoscopy on 7/14. Patient was admitted d/t esophageal mass.  Patient seen by this RD on 7/17 for  initial assessment. Secure chat message received from Surgery PA that ok to start TF this AM.   Patient was on the phone with no visitors at bedside at the time of RD visit. Let patient know of plan for TF start via G-tube today. Patient very appreciative.   Will place order as outlined above. He remains NPO with water sips allowed.   Medical Oncology NP note from today reviewed; pending Radiation Oncology consult. Plan for systemic chemo after d/c.   Labs reviewed; CBGs: 105, 103, 90 mg/dl. Medications reviewed; 40 mg IV protonix BID. IVF; NS @ 100 ml/hr.   Diet Order:   Diet Order             Diet NPO time specified Except for: Other (See Comments)  Diet effective now                   EDUCATION NEEDS:   Education needs have been addressed  Skin:  Skin Assessment: Skin Integrity Issues: Skin Integrity Issues:: Incisions Incisions: neck and abdomen (7/17)  Last BM:  PTA/unknown  Height:   Ht Readings from Last 1 Encounters:  08/10/21 '6\' 1"'$  (1.854 m)    Weight:   Wt Readings from Last 1 Encounters:  08/10/21 90.7 kg     BMI:  Body mass index is 26.39 kg/m.  Estimated Nutritional Needs:  Kcal:  2100-2400 kcal Protein:  105-125 grams Fluid:  >/= 2.3 L/day     Jarome Matin, MS, RD, LDN, CNSC Registered Dietitian II Inpatient Clinical Nutrition RD pager # and on-call/weekend pager # available in [facility]

## 2021-08-11 NOTE — Progress Notes (Signed)
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 Cancer of proximal third of esophagus (HCC)  C15.3       HISTORY OF PRESENT ILLNESS: Leslie Livingston is a 71 y.o. male seen at the request of Dr. Benay Spice.  He had sore throat for the past 3 months.  He has been evaluated in the emergency room on several occasions with this complaint.  He was referred to ENT on 07/14/2021.  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  No mass at the base of the tongue, supraglottis, or hypopharynx.  CT neck 07/29/2021 showed adenopathy at the thoracic inlet and lower bilateral jugular chains, esophagus mass, left vocal cord paralysis CT chest 07/29/2021 revealed diffuse esophageal wall thickening, most pronounced in the proximal esophagus, multiple pulmonary nodules, mediastinal and supraclavicular lymphadenopathy.  Upper endoscopy on 08/07/2021 demonstrated mass/stricture extending from the UES to 30 cm.  CTs chest, abdomen, and pelvis 08/07/2021 showed long segment wall thickening of the esophagus, prominent proximally, multiple large supraclavicular mediastinal nodes, bilateral lung nodules, and upper abdominal nodes.  Pathology showed squamous cell carcinoma.  He underwent placement of portacath and gastrostomy tube on 08/10/21  He has kindly been referred today for consideration of palliative radiotherapy for dysphagia.   PREVIOUS RADIATION THERAPY: No  PAST MEDICAL HISTORY:  Past Medical History:  Diagnosis Date   Allergy    Arthritis    Hyperlipidemia    Hypertension       PAST SURGICAL HISTORY: Past Surgical History:  Procedure  Laterality Date   BIOPSY  08/07/2021   Procedure: BIOPSY;  Surgeon: Milus Banister, MD;  Location: [facility];  Service: Gastroenterology;;   COLONOSCOPY WITH PROPOFOL N/A 05/05/2018   Procedure: COLONOSCOPY WITH PROPOFOL;  Surgeon: Irene Shipper, MD;  Location: [facility];  Service: Endoscopy;  Laterality: N/A;   ESOPHAGOGASTRODUODENOSCOPY (EGD) WITH PROPOFOL N/A 08/07/2021   Procedure: ESOPHAGOGASTRODUODENOSCOPY (EGD) WITH PROPOFOL;  Surgeon: Milus Banister, MD;  Location: [facility];  Service: Gastroenterology;  Laterality: N/A;    FAMILY HISTORY:  Family History  Problem Relation Age of Onset   Hypertension Other    Colon cancer Neg Hx    Stomach cancer Neg Hx    Esophageal cancer Neg Hx    Rectal cancer Neg Hx     SOCIAL HISTORY:  Social History   Socioeconomic History   Marital status: Single    Spouse name: Not on file   Number of children: Not on file   Years of education: Not on file   Highest education level: Not on file  Occupational History   Not on file  Tobacco Use   Smoking status: Former    Packs/day: 0.25    Types: Cigarettes    Quit date: 05/2021    Years since quitting: 0.2    Passive exposure: Past   Smokeless tobacco: Never  Vaping Use   Vaping Use: Never used  Substance and Sexual Activity   Alcohol use: Yes    Alcohol/week: 6.0 standard drinks of alcohol    Types: 6 Cans of beer per week   Drug use: No  Sexual activity: Not on file  Other Topics Concern   Not on file  Social History Narrative   Not on file   Social Determinants of Health   Financial Resource Strain: Not on file  Food Insecurity: Not on file  Transportation Needs: Not on file  Physical Activity: Not on file  Stress: Not on file  Social Connections: Not on file  Intimate Partner Violence: Not on file    ALLERGIES: Patient has no known allergies.  MEDICATIONS:  No current facility-administered medications for this visit.   No current outpatient medications  on file.   Facility-Administered Medications Ordered in Other Visits  Medication Dose Route Frequency Provider Last Rate Last Admin   diphenhydrAMINE (BENADRYL) injection 25 mg  25 mg Intravenous QHS PRN Coralie Keens, MD   25 mg at 08/10/21 2151   hydrALAZINE (APRESOLINE) injection 5 mg  5 mg Intravenous Q8H PRN Coralie Keens, MD       HYDROmorphone (DILAUDID) injection 1 mg  1 mg Intravenous Q2H PRN Coralie Keens, MD   1 mg at 08/10/21 2002   lactated ringers infusion   Intravenous Continuous Coralie Keens, MD 100 mL/hr at 08/11/21 0553 New Bag at 08/11/21 0553   mupirocin ointment (BACTROBAN) 2 % 1 Application  1 Application Nasal BID Coralie Keens, MD   1 Application at 70/26/37 0853   ondansetron (ZOFRAN) tablet 4 mg  4 mg Oral Q6H PRN Coralie Keens, MD       Or   ondansetron [employer]) injection 4 mg  4 mg Intravenous Q6H PRN Coralie Keens, MD       pantoprazole (PROTONIX) injection 40 mg  40 mg Intravenous Q12H Coralie Keens, MD   40 mg at 08/11/21 8588    REVIEW OF SYSTEMS:  On review of systems, the patient reports increasing dysphagia. A complete review of systems is obtained and is otherwise negative.    PHYSICAL EXAM:  Wt Readings from Last 3 Encounters:  08/10/21 200 lb (90.7 kg)  07/21/21 212 lb 12.8 oz (96.5 kg)  06/10/21 225 lb (102.1 kg)   Temp Readings from Last 3 Encounters:  08/11/21 98.8 F (37.1 C) (Oral)  07/21/21 98.1 F (36.7 C) (Oral)  06/25/21 98.3 F (36.8 C) (Oral)   BP Readings from Last 3 Encounters:  08/11/21 (!) 144/74  07/21/21 110/64  06/25/21 (!) 149/96   Pulse Readings from Last 3 Encounters:  08/11/21 (!) 55  07/21/21 72  06/25/21 (!) 56    /10  Per hospitalist   General exam: NAD, Depressed Respiratory system: CTA CVS; S 1, S 2 RRR Gastrointestinal system: BS present, soft, G tube in place.  Central nervous system: alert and oriented Extremities: No edema   KPS = 50  100 - Normal; no  complaints; no evidence of disease. 90   - Able to carry on normal activity; minor signs or symptoms of disease. 80   - Normal activity with effort; some signs or symptoms of disease. 71   - Cares for self; unable to carry on normal activity or to do active work. 60   - Requires occasional assistance, but is able to care for most of his personal needs. 50   - Requires considerable assistance and frequent medical care. 62   - Disabled; requires special care and assistance. 66   - Severely disabled; hospital admission is indicated although death not imminent. 37   - Very sick; hospital admission necessary; active supportive treatment necessary. 10   - Moribund; fatal processes  progressing rapidly. 0     - Dead  Karnofsky DA, Abelmann [place], Craver LS and Burchenal [facility] [phone]) The use of the nitrogen mustards in the palliative treatment of carcinoma: with particular reference to bronchogenic carcinoma Cancer 1 634-56  LABORATORY DATA:  Lab Results  Component Value Date   WBC 8.7 08/11/2021   HGB 10.0 (L) 08/11/2021   HCT 31.3 (L) 08/11/2021   MCV 88.9 08/11/2021   PLT 371 08/11/2021   Lab Results  Component Value Date   NA 138 08/10/2021   K 3.6 08/10/2021   CL 107 08/10/2021   CO2 24 08/10/2021   Lab Results  Component Value Date   ALT 12 08/07/2021   AST 12 (L) 08/07/2021   ALKPHOS 74 08/07/2021   BILITOT 0.6 08/07/2021     RADIOGRAPHY: DG C-Arm 1-60 Min-No Report  Result Date: 08/10/2021 Fluoroscopy was utilized by the requesting physician.  No radiographic interpretation.   CT CHEST ABDOMEN PELVIS W CONTRAST  Result Date: 08/07/2021 CLINICAL DATA:  Esophageal mass.  Staging.  * Tracking Code: BO * EXAM: CT CHEST, ABDOMEN, AND PELVIS WITH CONTRAST TECHNIQUE: Multidetector CT imaging of the chest, abdomen and pelvis was performed following the standard protocol during bolus administration of intravenous contrast. RADIATION DOSE REDUCTION: This exam was performed according to the  departmental dose-optimization program which includes automated exposure control, adjustment of the mA and/or kV according to patient size and/or use of iterative reconstruction technique. CONTRAST:  116m OMNIPAQUE IOHEXOL 300 MG/ML  SOLN COMPARISON:  CT neck and chest 07/29/2021. Abdominopelvic CT 06/15/2012. FINDINGS: CT CHEST FINDINGS Cardiovascular: No acute vascular findings. Three-vessel coronary artery atherosclerosis with lesser involvement of the aorta and great vessels. The heart size is normal. There is no pericardial effusion. Mediastinum/Nodes: Long segment wall thickening of the esophagus is again noted, most prominent proximally. Multiple enlarged supraclavicular and mediastinal lymph nodes are again noted bilaterally. A high right paraesophageal node measures 1.5 cm short axis on image 13/2, similar to recent prior study. Subcarinal nodes measure up to 1.3 cm short axis on image 34/2. There are small hilar lymph nodes bilaterally. No axillary adenopathy. No abnormality of the thyroid gland or trachea seen. Lungs/Pleura: No pleural effusion or pneumothorax. Bilateral pulmonary nodularity again noted. A previously referenced nodule medially in the left lower measures 2.4 x 2.2 cm on image 125/4 (previously 2.1 cm maximally). Right middle lobe nodule currently measures 2.0 cm on image 84/4 and demonstrates central cavitation (previously 1.9 cm). Increased subsegmental atelectasis posteriorly in the left upper lobe. Musculoskeletal/Chest wall: No chest wall mass or suspicious osseous findings. Multilevel spondylosis. CT ABDOMEN AND PELVIS FINDINGS Hepatobiliary: The liver is normal in density without suspicious focal abnormality. No evidence of gallstones, gallbladder wall thickening or biliary dilatation. Pancreas: Unremarkable. No pancreatic ductal dilatation or surrounding inflammatory changes. Spleen: Normal in size without focal abnormality. Adrenals/Urinary Tract: Both adrenal glands appear  normal. 1.2 cm cyst in the posterior interpolar region of the left kidney on image 16/7, similar to remote abdominal CT; no follow-up imaging recommended. No evidence of enhancing renal mass, urinary tract calculus or hydronephrosis. The bladder is mildly distended without focal abnormality. Stomach/Bowel: No enteric contrast administered. The stomach appears unremarkable for its degree of distension. No evidence of bowel wall thickening, distention or surrounding inflammatory change. Diverticular changes throughout the colon. The appendix appears normal. Vascular/Lymphatic: Prominent lymph nodes in the upper abdomen are similar to the remote abdominal CT, including a 9 mm gastrohepatic ligament node on image 66/2 and  an 8 mm portacaval node on image 69/2. No progressive abdominopelvic adenopathy identified. Aortic and branch vessel atherosclerosis without aneurysm or large vessel occlusion. Reproductive: The prostate gland and seminal vesicles appear unremarkable. Other: No ascites or peritoneal nodularity. Small umbilical hernia containing only fat. Asymmetric fat in the right inguinal canal. Musculoskeletal: No acute or significant osseous findings. Multilevel lumbar spondylosis. Moderately advanced asymmetric right hip osteoarthritis. IMPRESSION: 1. 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 Bilateral pulmonary nodularity has mildly progressed over the last 9 days. Supraclavicular and mediastinal adenopathy has not significantly changed. 2. No definite metastatic disease in the abdomen or pelvis. Mildly prominent lymph nodes in the upper abdomen are unchanged from remote abdominal CT of 2014, likely reactive. 3. Colonic diverticulosis without acute inflammation. 4. Coronary and Aortic Atherosclerosis (ICD10-I70.0). Electronically Signed   By: Richardean Sale M.D.   On: 08/07/2021 16:30   CT CHEST WO CONTRAST  Result Date: 07/30/2021 CLINICAL DATA:  Right midlung opacity,  hoarseness.  Former smoker. EXAM: CT CHEST WITHOUT CONTRAST TECHNIQUE: Multidetector CT imaging of the chest was performed following the standard protocol without IV contrast. RADIATION DOSE REDUCTION: This exam was performed according to the departmental dose-optimization program which includes automated exposure control, adjustment of the mA and/or kV according to patient size and/or use of iterative reconstruction technique. COMPARISON:  06/10/2021. FINDINGS: Cardiovascular: The heart is normal in size and there is a trace pericardial effusion. Multi-vessel coronary artery calcifications are noted. There is atherosclerotic calcification of the aorta without evidence of aneurysm. The pulmonary trunk is normal in caliber. Mediastinum/Nodes: Enlarged lymph nodes are present in the mediastinum measuring up to 1.5 cm in the right paratracheal space. Enlarged supraclavicular lymph nodes are seen bilaterally. There is a suspected left axillary lymphadenopathy. No axillary lymphadenopathy is seen. The thyroid gland and trachea are within normal limits. There is diffuse esophageal wall thickening, most pronounced in the proximal esophagus. Lungs/Pleura: The airways are patent. Multiple pulmonary nodules are present bilaterally measuring up to 2.1 cm in the left lower lobe medially, axial image 128. The largest nodule on the right measures 1.9 cm in the right middle lobe, axial image 81. No effusion or pneumothorax. Mild atelectasis is present in the posterior aspect of the left upper lobe. Upper Abdomen: There is fatty infiltration of the gastric wall suggesting chronic inflammatory changes. No adrenal nodule or mass. No acute abnormality. Musculoskeletal: Degenerative changes are present in the cervical and thoracic spine. No suspicious osseous abnormality or acute fracture. IMPRESSION: 1. Diffuse esophageal wall thickening, most pronounced in the proximal esophagus, concerning for malignancy. Endoscopy with tissue  sampling is recommended for definitive diagnosis. 2. Multiple pulmonary nodules bilaterally measuring up to 2.1 cm, highly suspicious for metastatic disease. Tissue sampling or PET-CT is suggested for further evaluation. 3. Mediastinal and supraclavicular lymphadenopathy, suspicious for metastatic disease. 4. Coronary artery calcifications. 5. Aortic atherosclerosis. Electronically Signed   By: Brett Fairy M.D.   On: 07/30/2021 03:23   CT SOFT TISSUE NECK W CONTRAST  Result Date: 07/29/2021 CLINICAL DATA:  Hoarseness in throat pain with vocal cord paralysis for 3 months. EXAM: CT NECK WITH CONTRAST TECHNIQUE: Multidetector CT imaging of the neck was performed using the standard protocol following the bolus administration of intravenous contrast. RADIATION DOSE REDUCTION: This exam was performed according to the departmental dose-optimization program which includes automated exposure control, adjustment of the mA and/or kV according to patient size and/or use of iterative reconstruction technique. CONTRAST:  21m ISOVUE-300 IOPAMIDOL (ISOVUE-300)  INJECTION 61% COMPARISON:  None Available. FINDINGS: Pharynx and larynx: No evidence of mass or inflammation. Enlarged left laryngeal ventricle and piriform sinus attributed to vocal cord paresis on the left. The underlying cause is attributed to a large mass circumferentially affecting the upper esophagus. Salivary glands: Asymmetric lobulation upward and posterior from the right submandibular gland, but isodense to parenchyma and not convincing for mass on reformats. This will be re-evaluated at surveillance imaging given the following. Thyroid: Negative Lymph nodes: Adenopathy at the thoracic inlet and lower bilateral jugular chain on both sides with low-density centers, attributed to metastatic disease. A left-sided node on 3:97 measures 18 mm. Vascular: Atheromatous wall thickening in the neck. No acute vascular finding. Limited intracranial: Remote left inferior  cerebellar infarction. Visualized orbits: Negative Mastoids and visualized paranasal sinuses: Retention cysts along the lower maxillary sinuses. Skeleton: Diffuse idiopathic skeletal hyperostosis with bulky spondylitic spurring throughout the cervical spine encroaching on the prevertebral space. Upper chest: Esophageal mass with thoracic adenopathy and pulmonary nodules. IMPRESSION: 1. Bulky esophageal malignancy at the thoracic inlet with lower cervical and thoracic adenopathy and pulmonary nodules, reference dedicated chest CT. 2. 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 Electronically Signed   By: Jorje Guild M.D.   On: 07/29/2021 18:37     IMPRESSION/PLAN: 1.         71 y.o. gentleman with Stage IV squamous cell carcinoma of the upper and mid esophagus with lung and nodal metastases and severe odynophagia. We reviewed the patient's course to date. Today I met with the patient and we discusses the pathology findings and the nature of metastatic esophageal cancer. He is struggling with his oral intake and with swallowing and has undergone G tube placement for nutrition. He needs outpatient PET imaging and will begin systemic therapy in the coming weeks. We are offering a palliative course of therapy to try to alleviate his dysphagia/odynophagia. We discussed the risks, benefits, short, and long term effects of radiotherapy, as well as the palliative intent, and the patient is interested in proceeding. I discussed the delivery and logistics of radiotherapy and the course of 3 weeks of radiotherapy to the esophagus. Written consent is obtained and placed in the chart, a copy was provided to the patient. We will simulate tomorrow and anticipate starting therapy on Thursday this week. If he remains inpatient we would bring him down each day for treatment, but can also approach this in the outpatient setting whenever his primary team feels he's appropriate for discharge.   We personally spent 60 minutes in this  encounter including chart review, reviewing radiological studies, meeting face-to-face with the patient, entering orders and completing documentation.      Carola Rhine, PAC    Tyler Pita, MD  [facility] Direct Dial: [phone]  Fax: [phone] [email or web address]  Skype  [email or web address]    Page Me by clicking the little pager icon at this link

## 2021-08-11 NOTE — Discharge Instructions (Signed)
[facility]      [facility] °[phone] ° °OPEN ABDOMINAL SURGERY: POST OP INSTRUCTIONS ° °Always review your discharge instruction sheet given to you by the facility where your surgery was performed. ° °IF YOU HAVE DISABILITY OR FAMILY LEAVE FORMS, YOU MUST BRING THEM TO THE OFFICE FOR PROCESSING.  PLEASE DO NOT GIVE THEM TO YOUR DOCTOR. ° °A prescription for pain medication may be given to you upon discharge.  Take your pain medication as prescribed, if needed.  If narcotic pain medicine is not needed, then you may take acetaminophen (Tylenol) or ibuprofen (Advil) as needed. °Take your usually prescribed medications unless otherwise directed. °If you need a refill on your pain medication, please contact your pharmacy. They will contact our office to request authorization.  Prescriptions will not be filled after 5pm or on week-ends. °You should follow a light diet the first few days after arrival home, such as soup and crackers, pudding, etc.unless your doctor has advised otherwise. A high-fiber, low fat diet can be resumed as tolerated.   Be sure to include lots of fluids daily. Most patients will experience some swelling and bruising on the chest and neck area.  Ice packs will help.  Swelling and bruising can take several days to resolve °Most patients will experience some swelling and bruising in the area of the incision. Ice pack will help. Swelling and bruising can take several days to resolve..  °It is common to experience some constipation if taking pain medication after surgery.  Increasing fluid intake and taking a stool softener will usually help or prevent this problem from occurring.  A mild laxative (Milk of Magnesia or Miralax) should be taken according to package directions if there are no bowel movements after 48 hours. ° You may have steri-strips (small skin tapes) in place directly over the incision.  These strips should be left on the skin for 7-10 days.  If your surgeon used skin  glue on the incision, you may shower in 24 hours.  The glue will flake off over the next 2-3 weeks.  Any sutures or staples will be removed at the office during your follow-up visit. You may find that a light gauze bandage over your incision may keep your staples from being rubbed or pulled. You may shower and replace the bandage daily. °ACTIVITIES:  You may resume regular (light) daily activities beginning the next day--such as daily self-care, walking, climbing stairs--gradually increasing activities as tolerated.  You may have sexual intercourse when it is comfortable.  Refrain from any heavy lifting or straining until approved by your doctor. °You may drive when you no longer are taking prescription pain medication, you can comfortably wear a seatbelt, and you can safely maneuver your car and apply brakes ° °You should see your doctor in the office for a follow-up appointment approximately two weeks after your surgery.  Make sure that you call for this appointment within a day or two after you arrive home to insure a convenient appointment time. ° °WHEN TO CALL YOUR DOCTOR: °Fever over 101.0 °Inability to urinate °Nausea and/or vomiting °Extreme swelling or bruising °Continued bleeding from incision. °Increased pain, redness, or drainage from the incision. °Difficulty swallowing or breathing °Muscle cramping or spasms. °Numbness or tingling in hands or feet or around lips. ° °The clinic staff is available to answer your questions during regular business hours.  Please don’t hesitate to call and ask to speak to one of the nurses if you have concerns. ° °For   further questions, please visit [email or web address]  °

## 2021-08-11 NOTE — Progress Notes (Signed)
PD 1 and Foundation one testing requested on Accession number [id number] per Dr Benay Spice

## 2021-08-11 NOTE — Progress Notes (Addendum)
PROGRESS NOTE    Leslie Livingston  CXK:[id number] DOB: [birth year] DOA: 08/07/2021 PCP: Lorene Dy, MD   Brief Narrative: 71 year old with past medical history significant for seasonal allergies, altered arthritis, hyperlipidemia, hypertension, tobacco and alcohol use presents complaining of dysphagia and odynophagia since last month, with imaging suspicious for malignant esophageal mass.  He lost about 15 pounds in the last month.  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  Bilateral pulmonary nodularity has mildly progressed over the last 9 days.  Supraclavicular and mediastinal adenopathy has not significantly changed.  No definitive metastatic disease in the abdomen or pelvis.  Mild prominent lymph nodes in the upper abdomen are unchanged from remote abdominal CT 2014, likely reactive.  Colonic diverticulosis without acute inflammation.  GI was consulted, patient underwent endoscopy 7/14, standard adult gastroscope could not pass into his very tortuous and narrow UES even after elective airway intubation for gagging and airway spasms, so a pediatric gastroscope was used to complete the exam.  Tight, 10 cm long circumferential proximal esophageal mass that extended from the UES to about 30 cm from the incisors.  The mass was friable, ulcerated and is most certainly a malignancy.  He will be started on tube feeding 7/18.    Assessment & Plan:   Principal Problem:   Esophageal mass Active Problems:   Essential hypertension   Iron deficiency anemia due to chronic blood loss   Dyslipidemia   GERD (gastroesophageal reflux disease)   Malnutrition of moderate degree   Dysphagia   Squamous cell carcinoma, esophagus (HCC)   1-Esophageal Mass, Dysphagia: Invasive moderately  differentiated squamous cell carcinoma -CT finding concerning with malignancy.  Metastatic disease in the chest -Endoscopy 7/14:  Tight, 10 cm long circumferential proximal esophageal mass that extended from the UES to about 30 cm from the incisors.  The mass was friable, ulcerated and is most certainly a malignancy. -Surgery consulted-underwent GT tube placement 7/17. -Discussed with Dr Benay Spice, Patient will require adjuvant radiation, chemotherapy.  -Continue with IV fluids.  -Radiation oncology consulted.  -Biopsy: Invasive moderately differentiated squamous cell carcinoma. S/p Port catheter placement 7/17. Plan to start enteral nutrition through G tube.   2-HTN; on  PRN hydralazine.  Resume Norvasc Holding lisinopril/   Iron deficiency anemia:  Received IV iron.  Monitor hb.   Dyslipidemia:  GERD; IV PPI.      Estimated body mass index is 26.39 kg/m as calculated from the following:   Height as of this encounter: '6\' 1"'$  (1.854 m).   Weight as of this encounter: 90.7 kg.   DVT prophylaxis: SCD, Code Status: Full code Family Communication: Care discussed with patient Disposition Plan:  Status is: Observation The patient will require care spanning > 2 midnights and should be moved to inpatient because: needs G tube, nutrition,     Consultants:  General Sx Dr Benay Spice.   Procedures:    Antimicrobials:    Subjective: He is concern with new diagnosis. He spoke with Dr Benay Spice today.  We discussed plan about starting nutrition today. He will need radiation and  chemo for  cancer.   Objective: Vitals:   08/11/21 0224 08/11/21 0541 08/11/21 0926 08/11/21 1313  BP: 134/71 (!) 154/48 (!) 144/74 (!) 155/71  Pulse: (!) 52 (!) 57 (!) 55 (!) 57  Resp: '16 18 18 '$ 18  Temp: 97.8 F (36.6 C) 98.4 F (36.9 C) 98.8 F (37.1 C) 99.1 F (37.3 C)  TempSrc: Oral Oral Oral Oral  SpO2: 95% 95% 97% 96%  Weight:      Height:        Intake/Output Summary (Last 24 hours)  at 08/11/2021 1357 Last data filed at 08/11/2021 1200 Gross per 24 hour  Intake 0 ml  Output 650 ml  Net -650 ml   Filed Weights   08/07/21 0940 08/07/21 1207 08/10/21 0845  Weight: 90.7 kg 90.7 kg 90.7 kg    Examination:  General exam: NAD, Depressed Respiratory system: CTA CVS; S 1, S 2 RRR Gastrointestinal system: BS present, soft, G tube in place.  Central nervous system: alert and oriented Extremities: No edema    Data Reviewed: I have personally reviewed following labs and imaging studies  CBC: Recent Labs  Lab 08/07/21 0938 08/08/21 0657 08/10/21 0416 08/11/21 0757  WBC 7.2 5.9 7.5 8.7  NEUTROABS 4.2  --   --   --   HGB 10.7* 9.7* 9.6* 10.0*  HCT 33.6* 30.5* 30.5* 31.3*  MCV 89.1 88.7 88.4 88.9  PLT 417* 359 353 962   Basic Metabolic Panel: Recent Labs  Lab 08/07/21 0938 08/08/21 0657 08/10/21 0416 08/11/21 0757  NA 141 139 138 139  K 3.8 4.0 3.6 3.8  CL 107 108 107 104  CO2 '24 22 24 25  '$ GLUCOSE 105* 99 85 97  BUN '18 16 12 14  '$ CREATININE 1.07 0.98 0.98 1.03  CALCIUM 9.7 9.0 9.0 9.1  MG  --   --   --  2.0   GFR: Estimated Creatinine Clearance: 74.3 mL/min (by C-G formula based on SCr of 1.03 mg/dL). Liver Function Tests: Recent Labs  Lab 08/07/21 0938  AST 12*  ALT 12  ALKPHOS 74  BILITOT 0.6  PROT 8.1  ALBUMIN 3.6   Recent Labs  Lab 08/07/21 0938  LIPASE 23   No results for input(s): "AMMONIA" in the last 168 hours. Coagulation Profile: No results for input(s): "INR", "PROTIME" in the last 168 hours. Cardiac Enzymes: No results for input(s): "CKTOTAL", "CKMB", "CKMBINDEX", "TROPONINI" in the last 168 hours. BNP (last 3 results) No results for input(s): "PROBNP" in the last 8760 hours. HbA1C: No results for input(s): "HGBA1C" in the last 72 hours. CBG: Recent Labs  Lab 08/10/21 1246 08/10/21 1854 08/11/21 0051 08/11/21 0651 08/11/21 1127  GLUCAP 101* 114* 105* 103* 90   Lipid Profile: No results for input(s): "CHOL",  "HDL", "LDLCALC", "TRIG", "CHOLHDL", "LDLDIRECT" in the last 72 hours. Thyroid Function Tests: No results for input(s): "TSH", "T4TOTAL", "FREET4", "T3FREE", "THYROIDAB" in the last 72 hours. Anemia Panel: No results for input(s): "VITAMINB12", "FOLATE", "FERRITIN", "TIBC", "IRON", "RETICCTPCT" in the last 72 hours.  Sepsis Labs: No results for input(s): "PROCALCITON", "LATICACIDVEN" in the last 168 hours.  Recent Results (from the past 240 hour(s))  Surgical PCR screen     Status: Abnormal   Collection Time: 08/10/21  7:44 AM   Specimen: Nasal Mucosa; Nasal Swab  Result Value Ref Range Status   MRSA, PCR NEGATIVE NEGATIVE Final   Staphylococcus aureus POSITIVE (A) NEGATIVE Final    Comment: (NOTE) The Xpert SA Assay (FDA approved for NASAL specimens in patients 66 years of age and older), is one component of a comprehensive surveillance program. It is not intended to diagnose infection nor to guide or monitor treatment. Performed at [facility], [place] [address] [place], [place] [address]  Radiology Studies: DG C-Arm 1-60 Min-No Report  Result Date: 08/10/2021 Fluoroscopy was utilized by the requesting physician.  No radiographic interpretation.        Scheduled Meds:  amLODipine  5 mg Oral Daily   feeding supplement (PROSource TF)  45 mL Per Tube TID   free water  150 mL Per Tube Q4H   mupirocin ointment  1 Application Nasal BID   pantoprazole (PROTONIX) IV  40 mg Intravenous Q12H   Continuous Infusions:  feeding supplement (OSMOLITE 1.5 CAL)     lactated ringers 100 mL/hr at 08/11/21 0553     LOS: 3 days    Time spent: 35 minutes    Shajuana Mclucas A Ara Mano, MD [facility]   If 7PM-7AM, please contact night-coverage [email or web address]  08/11/2021, 1:57 PM

## 2021-08-11 NOTE — Progress Notes (Signed)
Progress Note  1 Day Post-Op  Subjective: Pt denies significant abdominal pain. Unsure if passing flatus. Denies nausea.   Objective: Vital signs in last 24 hours: Temp:  [96.6 F (35.9 C)-98.8 F (37.1 C)] 98.8 F (37.1 C) (07/18 0926) Pulse Rate:  [52-58] 55 (07/18 0926) Resp:  [11-18] 18 (07/18 0926) BP: (130-161)/(48-79) 144/74 (07/18 0926) SpO2:  [94 %-100 %] 97 % (07/18 0926) Last BM Date : 08/09/21  Intake/Output from previous day: 07/17 0701 - 07/18 0700 In: 1871.7 [I.V.:1771.7; IV Piggyback:100] Out: 270 [Urine:250; Blood:20] Intake/Output this shift: No intake/output data recorded.  PE: General: pleasant, WD, WN male who is laying in bed in NAD Heart: regular, rate, and rhythm. Lungs: Respiratory effort nonlabored Abd: soft, NT, ND, +BS, midline incision with staples present and honeycomb, g tube clamped and sutured in place    Lab Results:  Recent Labs    08/10/21 0416 08/11/21 0757  WBC 7.5 8.7  HGB 9.6* 10.0*  HCT 30.5* 31.3*  PLT 353 371   BMET Recent Labs    08/10/21 0416  NA 138  K 3.6  CL 107  CO2 24  GLUCOSE 85  BUN 12  CREATININE 0.98  CALCIUM 9.0   PT/INR No results for input(s): "LABPROT", "INR" in the last 72 hours. CMP     Component Value Date/Time   NA 138 08/10/2021 0416   K 3.6 08/10/2021 0416   CL 107 08/10/2021 0416   CO2 24 08/10/2021 0416   GLUCOSE 85 08/10/2021 0416   BUN 12 08/10/2021 0416   CREATININE 0.98 08/10/2021 0416   CALCIUM 9.0 08/10/2021 0416   PROT 8.1 08/07/2021 0938   ALBUMIN 3.6 08/07/2021 0938   AST 12 (L) 08/07/2021 0938   ALT 12 08/07/2021 0938   ALKPHOS 74 08/07/2021 0938   BILITOT 0.6 08/07/2021 0938   GFRNONAA >60 08/10/2021 0416   GFRAA >60 05/06/2018 0239   Lipase     Component Value Date/Time   LIPASE 23 08/07/2021 0938       Studies/Results: DG C-Arm 1-60 Min-No Report  Result Date: 08/10/2021 Fluoroscopy was utilized by the requesting physician.  No radiographic  interpretation.    Anti-infectives: Anti-infectives (From admission, onward)    Start     Dose/Rate Route Frequency Ordered Stop   08/10/21 0900  ceFAZolin (ANCEF) IVPB 2g/100 mL premix        2 g 200 mL/hr over 30 Minutes Intravenous  Once 08/10/21 6010 08/10/21 1035        Assessment/Plan Esophageal mass Dysphagia - s/p upper endoscopy by Dr. Ardis Hughs which revealed a tight, 10cm long, circumferential proximal esophageal mass extending from UES to about 30cm from the incisors; this mass was friable, ulcerated and is most certainly a malignancy; several biopsies were taken; the esophagus was otherwise normal to the GE junction S/P PAC placement and open gastrostomy placement 08/10/21 Dr. Ninfa Linden POD1 - ok to start tube feeding today and advance diet as tolerated - ok to shower from a surgical standpoint - ok to use port - will schedule follow up for staple removal in our office in 10-14 days - flush g-tube daily even if not using for nutrition  - general surgery will not continue to follow acutely but please don't hesitate to reach out if any surgical concerns  ID - ancef pre-op VTE - SCDs, ok to have LMWH or SQH from surgical standpoint FEN - IVF, ok to start TF Foley - none   LOS: 3 days  I reviewed Consultant oncology notes, hospitalist notes, last 24 h vitals and pain scores, last 48 h intake and output, and last 24 h labs and trends.     Norm Parcel, [facility] 08/11/2021, 9:30 AM Please see Amion for pager number during day hours 7:00am-4:30pm

## 2021-08-11 NOTE — Progress Notes (Addendum)
IP PROGRESS NOTE  Subjective:   He continues to have dysphagia/odynophagia.  Had Port-A-Cath and feeding tube placed yesterday.  Has not yet started tube feeding.  Objective: Vital signs in last 24 hours: Blood pressure (!) 154/48, pulse (!) 57, temperature 98.4 F (36.9 C), temperature source Oral, resp. rate 18, height '6\' 1"'  (1.854 m), weight 90.7 kg, SpO2 95 %.  Intake/Output from previous day: 07/17 0701 - 07/18 0700 In: 1871.7 [I.V.:1771.7; IV Piggyback:100] Out: 270 [Urine:250; Blood:20]  Physical Exam:  HEENT: No thrush Abdomen: Nontender, no hepatosplenomegaly, gastrostomy tube in place Extremities: No leg edema  Port-A-Cath without erythema  Lab Results: Recent Labs    08/10/21 0416  WBC 7.5  HGB 9.6*  HCT 30.5*  PLT 353    BMET Recent Labs    08/10/21 0416  NA 138  K 3.6  CL 107  CO2 24  GLUCOSE 85  BUN 12  CREATININE 0.98  CALCIUM 9.0    No results found for: "CEA1", "CEA", "ECX507", "CA125"  Studies/Results: DG C-Arm 1-60 Min-No Report  Result Date: 08/10/2021 Fluoroscopy was utilized by the requesting physician.  No radiographic interpretation.    Medications: I have reviewed the patient's current medications.  Assessment/plan:  Metastatic esophageal cancer CT neck 07/29/2021-adenopathy at the thoracic inlet and lower bilateral jugular chains, esophagus mass, left vocal cord paralysis CT chest 07/29/2021-diffuse esophageal wall thickening, most pronounced in the proximal esophagus, multiple pulmonary nodules, mediastinal and supraclavicular lymphadenopathy Upper endoscopy 08/07/2021-mass/stricture extending from the UES to 30 cm CTs chest, abdomen, and pelvis 08/07/2021-long segment wall thickening of the esophagus, prominent proximally, multiple large supraclavicular mediastinal nodes, bilateral lung nodules,, and upper abdominal nodes similar to remote abdominal CT Biopsy 08/07/2021 showed invasive moderately differentiated squamous cell  carcinoma   Solid/liquid dysphagia secondary to #1 -Gastrostomy tube placement 07/31/2021 Weight loss secondary to #1 Odynophagia secondary to #1 History of colon polyps-tubular adenomas History of a diverticular bleed-April 2020 Anemia Thrombocytosis  Leslie. [place] appears unchanged.  Biopsy from 7/14 consistent with esophageal cancer.  Biopsy results were discussed with the patient today.  Recommend radiation oncology consult for consideration of palliative radiation to the esophageal mass due to the dysphagia and odynophagia.  Message has been sent to radiation oncology.  We will request PD-L1 and Foundation One testing on biopsy.  We will plan for systemic hemotherapy following hospital discharge.  Recommendations: 1.  Radiation oncology consult for consideration of palliative radiation to the esophageal mass. 2.  Initiate tube feeding per per general surgery. 3.  Outpatient follow-up will be scheduled at the cancer center.   LOS: 3 days   Mikey Bussing, NP   08/11/2021, 8:32 AM  Leslie Livingston was interviewed and examined.  He tolerated surgery well.  He appears stable.  I discussed the pathology findings with him.  He has been diagnosed with squamous cell carcinoma of the esophagus.  He appears to have metastatic disease involving neck/chest lymph nodes and lung nodules.  We will consult radiation oncology to consider a course of palliative radiation to the esophagus mass.  We will plan to begin systemic therapy, likely FOLFOX/nivolumab, within the next few weeks.  Outpatient follow-up is scheduled at the Cancer center.  I was present for greater than 50% of today's visit.  I performed medical decision making.

## 2021-08-11 NOTE — TOC Initial Note (Signed)
Transition of Care [facility]) - Initial/Assessment Note   Patient Details  Name: Leslie Livingston MRN: [id number] Date of Birth: 14-Sep-1950  Transition of Care [facility]) CM/SW Contact:    Sherie Don, LCSW Phone Number: 08/11/2021, 2:51 PM  Clinical Narrative: Patient will discharge home with tube feedings and will need HHRN as well as the feedings set up prior to discharge. CSW met with patient to discuss referrals for [facility] and tube feedings. Patient agreeable to referrals to [facility] for feedings and [facility] for [facility].  CSW made tube feeding referral to [facility] with [facility], which was accepted. Awaiting discharge tube feeding plan before supplies will be set up/ordered. CSW made [facility] referral to [place]ind[facility] with [facility], which was accepted. TOC awaiting finalized tube feeding plan.  Expected Discharge Plan: [place] Barriers to Discharge: Continued Medical Work up  Patient Goals and CMS Choice Patient states their goals for this hospitalization and ongoing recovery are:: Return home CMS Medicare.gov Compare Post Acute Care list provided to:: Patient Choice offered to / list presented to : Patient  Expected Discharge Plan and Services Expected Discharge Plan: [place] In-house Referral: Clinical Social Work Post Acute Care Choice: Durable Medical Equipment, Home Health Living arrangements for the past 2 months: Single Family Home            DME Arranged: Tube feeding DME Agency: [facility] Date DME Agency Contacted: 08/11/21 Time DME Agency Contacted: 0737 Representative spoke with at DME Agency: Andee Poles HH Arranged: RN [place] Agency: [place] Date [place]: 08/11/21 Time [place]: [place] Representative spoke with at [place]: [place] Arrangements/Services Living arrangements for the past 2 months: [place] Patient language and need for interpreter reviewed:: Yes Do you feel safe going back to the place  where you live?: Yes      Need for Family Participation in Patient Care: No (Comment) Care giver support system in place?: Yes (comment) Criminal Activity/Legal Involvement Pertinent to Current Situation/Hospitalization: No - Comment as needed  Activities of Daily Living Home Assistive Devices/Equipment: None ADL Screening (condition at time of admission) Patient's cognitive ability adequate to safely complete daily activities?: Yes Is the patient deaf or have difficulty hearing?: No Does the patient have difficulty seeing, even when wearing glasses/contacts?: No Does the patient have difficulty concentrating, remembering, or making decisions?: No Patient able to express need for assistance with ADLs?: Yes Does the patient have difficulty dressing or bathing?: No Independently performs ADLs?: Yes (appropriate for developmental age) Does the patient have difficulty walking or climbing stairs?: No Weakness of Legs: None Weakness of Arms/Hands: None  Permission Sought/Granted Permission sought to share information with : Other (comment) Permission granted to share information with : Yes, Verbal Permission Granted Permission granted to share info w AGENCY: [facility], [facility]  Emotional Assessment Appearance:: Appears stated age Attitude/Demeanor/Rapport: Engaged Affect (typically observed): Accepting Orientation: : Oriented to Self, Oriented to Place, Oriented to  Time, Oriented to Situation Alcohol / Substance Use: Not Applicable Psych Involvement: No (comment)  Admission diagnosis:  Esophageal mass [K22.89] Dysphagia, unspecified type [R13.10] Patient Active Problem List   Diagnosis Date Noted   Malnutrition of moderate degree 08/11/2021   Cancer of proximal third of esophagus ([place]) 08/11/2021   Dysphagia 08/11/2021   Squamous cell carcinoma, esophagus ([place]) 08/11/2021   Esophageal mass 08/07/2021   Iron deficiency anemia due to chronic blood loss 05/05/2018   Dyslipidemia  05/05/2018   Obesity (BMI 30-39.9)  05/05/2018   GERD (gastroesophageal reflux disease) 05/05/2018   Acute blood loss anemia 05/05/2018   Diverticulosis of colon with hemorrhage    Rectal bleed 05/04/2018   GI bleed 05/02/2018   ERECTILE DYSFUNCTION 06/06/2007   ANEMIA-NOS 12/16/2006   Essential hypertension 12/16/2006   HEMORRHOIDS, EXTERNAL 12/16/2006   PCP:  Lorene Dy, MD Pharmacy:   [facility], [address] [place] [address] Phone: [phone] Fax: [phone]  Readmission Risk Interventions     No data to display

## 2021-08-11 NOTE — Progress Notes (Signed)
Chaplain engaged in an initial visit with Leslie Livingston.  Chaplain spent time getting to know about Kaysen's prognosis and where he stands emotionally and mentally.  He voiced feeling like right now is hard but he has hope for the future and that all will be well.    Chaplain offered reflective listening, prayer over him, and support.    08/11/21 1400  Clinical Encounter Type  Visited With Patient  Visit Type Initial;Spiritual support  Spiritual Encounters  Spiritual Needs Prayer

## 2021-08-12 ENCOUNTER — Other Ambulatory Visit: Payer: Medicare PPO

## 2021-08-12 ENCOUNTER — Ambulatory Visit
Admission: RE | Admit: 2021-08-12 | Discharge: 2021-08-12 | Disposition: A | Discharge status: Home / Self Care | Payer: Medicare PPO | Source: Ambulatory Visit | Attending: Radiation Oncology | Admitting: Radiation Oncology

## 2021-08-12 DIAGNOSIS — K2289 Other specified disease of esophagus: Secondary | ICD-10-CM | POA: Diagnosis not present

## 2021-08-12 DIAGNOSIS — C153 Malignant neoplasm of upper third of esophagus: Secondary | ICD-10-CM

## 2021-08-12 LAB — CBC
HCT: 30.3 % — ABNORMAL LOW (ref 39.0–52.0)
Hemoglobin: 9.7 g/dL — ABNORMAL LOW (ref 13.0–17.0)
MCH: 28.2 pg (ref 26.0–34.0)
MCHC: 32 g/dL (ref 30.0–36.0)
MCV: 88.1 fL (ref 80.0–100.0)
Platelets: 366 10*3/uL (ref 150–400)
RBC: 3.44 MIL/uL — ABNORMAL LOW (ref 4.22–5.81)
RDW: 12.3 % (ref 11.5–15.5)
WBC: 7.6 10*3/uL (ref 4.0–10.5)
nRBC: 0 % (ref 0.0–0.2)

## 2021-08-12 LAB — BASIC METABOLIC PANEL
Anion gap: 9 (ref 5–15)
BUN: 14 mg/dL (ref 8–23)
CO2: 26 mmol/L (ref 22–32)
Calcium: 8.9 mg/dL (ref 8.9–10.3)
Chloride: 104 mmol/L (ref 98–111)
Creatinine, Ser: 0.96 mg/dL (ref 0.61–1.24)
GFR, Estimated: >60 mL/min (ref >60)
Glucose, Bld: 144 mg/dL — ABNORMAL HIGH (ref 70–99)
Potassium: 3.4 mmol/L — ABNORMAL LOW (ref 3.5–5.1)
Sodium: 139 mmol/L (ref 135–145)

## 2021-08-12 LAB — GLUCOSE, CAPILLARY
Glucose-Capillary: 120 mg/dL — ABNORMAL HIGH (ref 70–99)
Glucose-Capillary: 126 mg/dL — ABNORMAL HIGH (ref 70–99)
Glucose-Capillary: 126 mg/dL — ABNORMAL HIGH (ref 70–99)
Glucose-Capillary: 131 mg/dL — ABNORMAL HIGH (ref 70–99)
Glucose-Capillary: 134 mg/dL — ABNORMAL HIGH (ref 70–99)
Glucose-Capillary: 145 mg/dL — ABNORMAL HIGH (ref 70–99)
Glucose-Capillary: 158 mg/dL — ABNORMAL HIGH (ref 70–99)

## 2021-08-12 LAB — PHOSPHORUS
Phosphorus: 2.4 mg/dL — ABNORMAL LOW (ref 2.5–4.6)
Phosphorus: 4.1 mg/dL (ref 2.5–4.6)

## 2021-08-12 LAB — MAGNESIUM
Magnesium: 2 mg/dL (ref 1.7–2.4)
Magnesium: 1.8 mg/dL (ref 1.7–2.4)

## 2021-08-12 MED ORDER — DEXTROSE 5 % IV SOLN
30.0000 mmol | Freq: Once | INTRAVENOUS | Status: AC
  Administered 2021-08-12: 30 mmol via INTRAVENOUS
  Filled 2021-08-12: qty 10

## 2021-08-12 NOTE — Progress Notes (Signed)
  Radiation Oncology         [phone] ________________________________  Name: Leslie Livingston MRN: [id number]  Date: 08/12/2021  DOB: Mar 29, 1950  INPATIENT  SIMULATION AND TREATMENT PLANNING NOTE    ICD-10-CM   1. 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  Identity was confirmed.  All relevant records and images related to the planned course of therapy were reviewed.  The patient freely provided informed written consent to proceed with treatment after reviewing the details related to the planned course of therapy. The consent form was witnessed and verified by the simulation staff.  Then, the patient was set-up in a stable reproducible  supine position for radiation therapy.  CT images were obtained.  Surface markings were placed.  The CT images were loaded into the planning software.  Then the target and avoidance structures were contoured.  Treatment planning then occurred.  The radiation prescription was entered and confirmed.  Then, I designed and supervised the construction of multiple medically necessary complex treatment devices including body positioner and MLCs to shield the spinal cord, heart and lungs.  I have requested : 3D Simulation  I have requested a DVH of the following structures: left lung, right lung, heart, spinal cord and target.    PLAN:  The patient will receive 35 Gy in 14 fractions.  ________________________________  Sheral Apley Tammi Klippel, M.D.

## 2021-08-12 NOTE — Progress Notes (Signed)
PROGRESS NOTE  Leslie Livingston VZD:[id number] DOB: 01/15/1951 DOA: 08/07/2021 PCP: Lorene Dy, MD  Brief History   71 year old with past medical history significant for seasonal allergies, altered arthritis, hyperlipidemia, hypertension, tobacco and alcohol use presents complaining of dysphagia and odynophagia since last month, with imaging suspicious for malignant esophageal mass.  He lost about 15 pounds in the last month.   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  Bilateral pulmonary nodularity has mildly progressed over the last 9 days.  Supraclavicular and mediastinal adenopathy has not significantly changed.  No definitive metastatic disease in the abdomen or pelvis.  Mild prominent lymph nodes in the upper abdomen are unchanged from remote abdominal CT 2014, likely reactive.  Colonic diverticulosis without acute inflammation.   GI was consulted, patient underwent endoscopy 7/14, standard adult gastroscope could not pass into his very tortuous and narrow UES even after elective airway intubation for gagging and airway spasms, so a pediatric gastroscope was used to complete the exam.  Tight, 10 cm long circumferential proximal esophageal mass that extended from the UES to about 30 cm from the incisors.  The mass was friable, ulcerated and is most certainly a malignancy.   He was started on tube feeding 7/18. Goal rate is 55 cc /hr.   Plan is to discharge to home with home health RN and tube feeds.  Consultants  Medical oncology General surgery Gastroenterology Radiation oncology  Procedures  EGD with biopsy Surgical G tube placed on 08/11/2021 Port placement Simulation of radiation treatment  Antibiotics   Anti-infectives (From admission, onward)    Start     Dose/Rate  Route Frequency Ordered Stop   08/10/21 0900  ceFAZolin (ANCEF) IVPB 2g/100 mL premix        2 g 200 mL/hr over 30 Minutes Intravenous  Once 08/10/21 0852 08/10/21 1035      Subjective  The patient is resting comfortably. No new complaints.  Objective   Vitals:  Vitals:   08/12/21 0523 08/12/21 1350  BP: 129/76 96/82  Pulse: 61 64  Resp: 18 14  Temp: 99.3 F (37.4 C) 98.8 F (37.1 C)  SpO2: 95% 93%    Exam:  Constitutional:  Appears calm and comfortable Respiratory:  CTA bilaterally, no w/r/r.  Respiratory effort normal. No retractions or accessory muscle use Cardiovascular:  RRR, no m/r/g No LE extremity edema   Normal pedal pulses Abdomen:  Abdomen appears normal; no tenderness or masses No hernias No HSM Musculoskeletal:  Digits/nails BUE: no clubbing, cyanosis, petechiae, infection exam of joints, bones, muscles of at least one of following: head/neck, RUE, LUE, RLE, LLE   strength and tone normal, no atrophy, no abnormal movements No tenderness, masses Normal ROM, no contractures  gait and station Skin:  No rashes, lesions, ulcers palpation of skin: no induration or nodules Neurologic:  CN 2-12 intact Sensation all 4 extremities intact Psychiatric:  Mental status Mood, affect appropriate Orientation to person, place, time  judgment and insight appear intact     I have personally reviewed the following:   Today's Data  Vitals  Lab Data  BMP, Magnesium, Phosphorus CBC  Micro Data  Nasal Swab positive for staph aureus  Imaging  CT chest, abdomen and pelvis  Cardiology Data  EKG  Other Data  Biopsy from esophagus: Invasive, moderately differentiated Squamous cell  CA  Scheduled Meds:  amLODipine  5 mg Oral Daily   feeding supplement (PROSource TF)  45 mL Per Tube TID   free water  150 mL Per Tube Q4H   melatonin  3 mg Oral QHS   mupirocin ointment  1 Application Nasal BID   pantoprazole (PROTONIX) IV  40 mg Intravenous Q12H    Continuous Infusions:  feeding supplement (OSMOLITE 1.5 CAL) 35 mL/hr at 08/12/21 3338   lactated ringers 100 mL/hr at 08/12/21 3291    Principal Problem:   Esophageal mass Active Problems:   Essential hypertension   Iron deficiency anemia due to chronic blood loss   Dyslipidemia   GERD (gastroesophageal reflux disease)   Malnutrition of moderate degree   Dysphagia   Squamous cell carcinoma, esophagus (HCC)   LOS: 4 days    A & P  Esophageal mass/Squamous cell CA of the esophagus: The patient underwent simulation of radiation treatment on 08/12/2021. He will have first treatment today. Systemic therapy to begin as outpatient. Surgical G-tube and port has been placed. The patient is receiving G-tube feeds which are being incrementally increased towards his goal rate of 55 cc/hr. Medical oncology and radiation oncology have been consulted.  Severe dysphagia/Moderate degree of malnutrition: The patient is receiving G-tube feeds which are being incrementally increased towards his goal rate of 55 cc/hr. He is thus far tolerating the feeds well.  Iron Deficiency Anemia: Noted. Hemoglobin is stable at 9.7. Continue to monitor. Supplement iron orally.  Essential Hypertension: Patient's blood pressures are normotensive. No scheduled antihypertensives.  GERD: Continue PPI.  I have seen and examined this patient myself. I have spent 34 minutes in his evaluation and care.  DVT prophylaxis: SCD's Code Status: Full Code Family Communication: None available Disposition Plan: Home with home health    Charon Akamine, DO [facility] Direct contact: see [email or web address]  7PM-7AM contact night coverage as above 08/12/2021, 6:30 PM  LOS: 4 days

## 2021-08-12 NOTE — Anesthesia Postprocedure Evaluation (Signed)
Anesthesia Post Note  Patient: Leslie Livingston  Procedure(s) Performed: INSERTION PORT-A-CATH (Neck) INSERTION OF GASTROSTOMY TUBE (Abdomen)     Patient location during evaluation: PACU Anesthesia Type: General Level of consciousness: awake and alert Pain management: pain level controlled Vital Signs Assessment: post-procedure vital signs reviewed and stable Respiratory status: spontaneous breathing, nonlabored ventilation, respiratory function stable and patient connected to nasal cannula oxygen Cardiovascular status: blood pressure returned to baseline and stable Postop Assessment: no apparent nausea or vomiting Anesthetic complications: no   No notable events documented.  Last Vitals:  Vitals:   08/11/21 1313 08/11/21 2133  BP: (!) 155/71 (!) 144/79  Pulse: (!) 57 62  Resp: 18 18  Temp: 37.3 C 37.3 C  SpO2: 96% 93%    Last Pain:  Vitals:   08/11/21 2137  TempSrc:   PainSc: 0-No pain                 Tiajuana Amass

## 2021-08-12 NOTE — Plan of Care (Signed)
  Problem: Health Behavior/Discharge Planning: Goal: Ability to manage health-related needs will improve Outcome: Progressing   Problem: Clinical Measurements: Goal: Will remain free from infection Outcome: Progressing   Problem: Clinical Measurements: Goal: Diagnostic test results will improve Outcome: Progressing   Problem: Nutrition: Goal: Adequate nutrition will be maintained Outcome: Progressing   Problem: Coping: Goal: Level of anxiety will decrease Outcome: Progressing   Problem: Pain Managment: Goal: General experience of comfort will improve Outcome: Progressing

## 2021-08-13 ENCOUNTER — Encounter: Payer: Self-pay | Admitting: Oncology

## 2021-08-13 ENCOUNTER — Inpatient Hospital Stay (HOSPITAL_COMMUNITY): Payer: Medicare PPO

## 2021-08-13 ENCOUNTER — Other Ambulatory Visit: Payer: Self-pay

## 2021-08-13 ENCOUNTER — Ambulatory Visit
Admit: 2021-08-13 | Discharge: 2021-08-13 | Disposition: A | Discharge status: Home / Self Care | Payer: Medicare PPO | Attending: Radiation Oncology | Admitting: Radiation Oncology

## 2021-08-13 DIAGNOSIS — M7989 Other specified soft tissue disorders: Secondary | ICD-10-CM | POA: Diagnosis not present

## 2021-08-13 DIAGNOSIS — K2289 Other specified disease of esophagus: Secondary | ICD-10-CM | POA: Diagnosis not present

## 2021-08-13 LAB — MAGNESIUM
Magnesium: 1.7 mg/dL (ref 1.7–2.4)
Magnesium: 2 mg/dL (ref 1.7–2.4)

## 2021-08-13 LAB — RAD ONC ARIA SESSION SUMMARY
Course Elapsed Days: 0
Plan Fractions Treated to Date: 1
Plan Prescribed Dose Per Fraction: 2.5 Gy
Plan Total Fractions Prescribed: 14
Plan Total Prescribed Dose: 35 Gy
Reference Point Dosage Given to Date: 2.5 Gy
Reference Point Session Dosage Given: 2.5 Gy
Session Number: 1

## 2021-08-13 LAB — GLUCOSE, CAPILLARY
Glucose-Capillary: 105 mg/dL — ABNORMAL HIGH (ref 70–99)
Glucose-Capillary: 110 mg/dL — ABNORMAL HIGH (ref 70–99)
Glucose-Capillary: 124 mg/dL — ABNORMAL HIGH (ref 70–99)
Glucose-Capillary: 152 mg/dL — ABNORMAL HIGH (ref 70–99)
Glucose-Capillary: 162 mg/dL — ABNORMAL HIGH (ref 70–99)
Glucose-Capillary: 162 mg/dL — ABNORMAL HIGH (ref 70–99)

## 2021-08-13 LAB — PHOSPHORUS
Phosphorus: 3.3 mg/dL (ref 2.5–4.6)
Phosphorus: 3.1 mg/dL (ref 2.5–4.6)

## 2021-08-13 MED ORDER — ORAL CARE MOUTH RINSE
15.0000 mL | OROMUCOSAL | Status: DC
  Administered 2021-08-13 – 2021-08-16 (×10): 15 mL via OROMUCOSAL

## 2021-08-13 MED ORDER — ORAL CARE MOUTH RINSE: 15.0000 mL | OROMUCOSAL | Status: DC | PRN

## 2021-08-13 MED ORDER — MORPHINE SULFATE (CONCENTRATE) 10 MG/0.5ML PO SOLN: 5.0000 mg | ORAL | Status: DC | PRN

## 2021-08-13 NOTE — Progress Notes (Signed)
Left upper extremity venous duplex has been completed. Preliminary results can be found in CV Proc through chart review.   08/13/21 8:44 AM Carlos Levering RVT

## 2021-08-13 NOTE — Progress Notes (Addendum)
   08/13/21 1100  Mobility  Activity Ambulated with assistance in hallway  Level of Assistance Standby assist, set-up cues, supervision of patient - no hands on  Assistive Device Front wheel walker  Distance Ambulated (ft) 80 ft  Activity Response Tolerated well  Transport method Ambulatory  $Mobility charge 1 Mobility   Pt had no claims of dizziness, some slight pain when standing in abdomen area, near incision. Nurse assisted with line management prior and post session.  Roderick Pee  Mobility Specialist

## 2021-08-13 NOTE — Progress Notes (Addendum)
IP PROGRESS NOTE  Subjective:   He continues to have dysphagia/odynophagia.  Tolerating tube feed.  Has developed left upper extremity and neck edema.  Objective: Vital signs in last 24 hours: Blood pressure (!) 136/55, pulse 69, temperature 98.7 F (37.1 C), temperature source Oral, resp. rate 16, height '6\' 1"'$  (1.854 m), weight 98.9 kg, SpO2 100 %.  Intake/Output from previous day: 07/19 0701 - 07/20 0700 In: 1842.5 [I.V.:1266.9; NG/GT:575.6] Out: 1150 [Urine:1150]  Physical Exam:  HEENT: No thrush Abdomen: Nontender, no hepatosplenomegaly, gastrostomy tube in place Extremities: No leg edema, left upper extremity edema  Port-A-Cath without erythema  Lab Results: Recent Labs    08/11/21 0757 08/12/21 0501  WBC 8.7 7.6  HGB 10.0* 9.7*  HCT 31.3* 30.3*  PLT 371 366    BMET Recent Labs    08/11/21 0757 08/12/21 0501  NA 139 139  K 3.8 3.4*  CL 104 104  CO2 25 26  GLUCOSE 97 144*  BUN 14 14  CREATININE 1.03 0.96  CALCIUM 9.1 8.9    No results found for: "CEA1", "CEA", "OJJ009", "CA125"  Studies/Results: No results found.  Medications: I have reviewed the patient's current medications.  Assessment/plan:  Metastatic esophageal cancer CT neck 07/29/2021-adenopathy at the thoracic inlet and lower bilateral jugular chains, esophagus mass, left vocal cord paralysis CT chest 07/29/2021-diffuse esophageal wall thickening, most pronounced in the proximal esophagus, multiple pulmonary nodules, mediastinal and supraclavicular lymphadenopathy Upper endoscopy 08/07/2021-mass/stricture extending from the UES to 30 cm CTs chest, abdomen, and pelvis 08/07/2021-long segment wall thickening of the esophagus, prominent proximally, multiple large supraclavicular mediastinal nodes, bilateral lung nodules,, and upper abdominal nodes similar to remote abdominal CT Biopsy 08/07/2021 showed invasive moderately differentiated squamous cell carcinoma   Solid/liquid dysphagia secondary to  #1 -Gastrostomy tube placement 07/31/2021 Weight loss secondary to #1 Odynophagia secondary to #1 History of colon polyps-tubular adenomas History of a diverticular bleed-April 2020 Anemia Thrombocytosis Left subclavian Port-A-Cath placement 08/10/2021  Leslie Livingston appears unchanged.  He continues to have dysphagia and odynophagia.  He is currently receiving IV pain medication.  We will try him on Roxanol 5 to 10 mg sublingual every 4 hours as needed.  He has developed a left upper extremity/neck swelling.  Has a Port-A-Cath on this side.  Will check left upper extremity Doppler to evaluate for DVT.  If he does have a DVT, recommend heparin drip for several days and then transition to Eliquis upon hospital discharge.  The patient has metastatic esophageal cancer.  He will begin a course of palliative radiation to his esophageal mass.  We will arrange for outpatient follow-up following hospital discharge to discuss systemic treatment options.  We will likely proceed with FOLFOX/nivolumab.   Recommendations: 1.  Begin Roxanol 5 to 10 mg sublingual every 4 hours as needed. 2.  Doppler ultrasound of left upper extremity to evaluate for DVT. 3.  Palliative radiation under the care of Dr. Tammi Klippel 4.  Continue tube feeding.  Dietitian following. 5.  Outpatient follow-up will be scheduled at the cancer center.   LOS: 5 days   Leslie Bussing, NP   08/13/2021, 8:12 AM Leslie Livingston was interviewed and examined.  He reports tolerated the tube feedings well.  He has developed soreness at the left upper chest/neck and swelling of the left arm.  We will check a Doppler to check for a Port-A-Cath related DVT.  He will begin palliative the esophagus today.  He is scheduled for outpatient follow-up at the Cancer center next week.  The plan is to begin FOLFOX/nivolumab at the completion of radiation.  He will begin a trial of Roxanol for pain.  I was present for greater than 50% today's visit.  I  performed medical decision making.

## 2021-08-14 ENCOUNTER — Encounter: Payer: Medicare PPO | Admitting: Speech Pathology

## 2021-08-14 ENCOUNTER — Ambulatory Visit
Admit: 2021-08-14 | Discharge: 2021-08-14 | Disposition: A | Discharge status: Home / Self Care | Payer: Medicare PPO | Attending: Radiation Oncology | Admitting: Radiation Oncology

## 2021-08-14 ENCOUNTER — Inpatient Hospital Stay (HOSPITAL_COMMUNITY): Payer: Medicare PPO

## 2021-08-14 ENCOUNTER — Other Ambulatory Visit: Payer: Self-pay

## 2021-08-14 DIAGNOSIS — K2289 Other specified disease of esophagus: Secondary | ICD-10-CM | POA: Diagnosis not present

## 2021-08-14 LAB — RAD ONC ARIA SESSION SUMMARY
Course Elapsed Days: 1
Plan Fractions Treated to Date: 2
Plan Prescribed Dose Per Fraction: 2.5 Gy
Plan Total Fractions Prescribed: 14
Plan Total Prescribed Dose: 35 Gy
Reference Point Dosage Given to Date: 5 Gy
Reference Point Session Dosage Given: 2.5 Gy
Session Number: 2

## 2021-08-14 LAB — GLUCOSE, CAPILLARY
Glucose-Capillary: 114 mg/dL — ABNORMAL HIGH (ref 70–99)
Glucose-Capillary: 119 mg/dL — ABNORMAL HIGH (ref 70–99)
Glucose-Capillary: 129 mg/dL — ABNORMAL HIGH (ref 70–99)
Glucose-Capillary: 141 mg/dL — ABNORMAL HIGH (ref 70–99)
Glucose-Capillary: 142 mg/dL — ABNORMAL HIGH (ref 70–99)
Glucose-Capillary: 150 mg/dL — ABNORMAL HIGH (ref 70–99)

## 2021-08-14 LAB — MAGNESIUM: Magnesium: 1.8 mg/dL (ref 1.7–2.4)

## 2021-08-14 LAB — PHOSPHORUS: Phosphorus: 3.4 mg/dL (ref 2.5–4.6)

## 2021-08-14 MED ORDER — ZOLPIDEM TARTRATE 5 MG PO TABS
5.0000 mg | ORAL_TABLET | Freq: Every evening | ORAL | Status: DC | PRN
Start: 2021-08-14 — End: 2021-08-17
  Administered 2021-08-14 – 2021-08-15 (×2): 5 mg via ORAL
  Filled 2021-08-14 (×3): qty 1

## 2021-08-14 MED ORDER — IOHEXOL 350 MG/ML SOLN
100.0000 mL | Freq: Once | INTRAVENOUS | Status: AC | PRN
  Administered 2021-08-14: 100 mL via INTRAVENOUS

## 2021-08-14 NOTE — Progress Notes (Addendum)
IP PROGRESS NOTE  Subjective:   He continues to have dysphagia/odynophagia.  Tolerating tube feed.  Continues to have left upper extremity and neck swelling.  Doppler ultrasound performed 7/20 negative for DVT.  Objective: Vital signs in last 24 hours: Blood pressure 97/64, pulse 68, temperature 99.4 F (37.4 C), temperature source Oral, resp. rate 14, height '6\' 1"'$  (1.854 m), weight 101.4 kg, SpO2 95 %.  Intake/Output from previous day: 07/20 0701 - 07/21 0700 In: 2910.6 [I.V.:2340.6; NG/GT:450] Out: 700 [Urine:400; Stool:300]  Physical Exam:  HEENT: No thrush Abdomen: Nontender, no hepatosplenomegaly, gastrostomy tube in place Extremities: No leg edema, left upper extremity edema  Port-A-Cath without erythema  Lab Results: Recent Labs    08/12/21 0501  WBC 7.6  HGB 9.7*  HCT 30.3*  PLT 366    BMET Recent Labs    08/12/21 0501  NA 139  K 3.4*  CL 104  CO2 26  GLUCOSE 144*  BUN 14  CREATININE 0.96  CALCIUM 8.9    No results found for: "CEA1", "CEA", "OHY073", "CA125"  Studies/Results: VAS [place] UPPER EXTREMITY VENOUS DUPLEX  Result Date: 08/13/2021 UPPER VENOUS STUDY  Patient Name:  Leslie Livingston  Date of Exam:   08/13/2021 Medical Rec #: [id number]          Accession #:    [id number] Date of Birth: [birth year]           Patient Gender: M Patient Age:   71 years Exam Location:  [facility] Procedure:      VAS [place] UPPER EXTREMITY VENOUS DUPLEX Referring Phys: Mikey Bussing --------------------------------------------------------------------------------  Indications: Swelling Risk Factors: Surgery. Comparison Study: No prior studies. Performing Technologist: Oliver Hum RVT  Examination Guidelines: A complete evaluation includes B-mode imaging, spectral Doppler, color Doppler, and power Doppler as needed of all accessible portions of each vessel. Bilateral testing is considered an integral part of a complete examination. Limited examinations for reoccurring  indications may be performed as noted.  Right Findings: +----------+------------+---------+-----------+----------+-------+ RIGHT     CompressiblePhasicitySpontaneousPropertiesSummary +----------+------------+---------+-----------+----------+-------+ Subclavian    Full       Yes       Yes                      +----------+------------+---------+-----------+----------+-------+  Left Findings: +----------+------------+---------+-----------+----------+-------+ LEFT      CompressiblePhasicitySpontaneousPropertiesSummary +----------+------------+---------+-----------+----------+-------+ IJV           Full       Yes       Yes                      +----------+------------+---------+-----------+----------+-------+ Subclavian    Full       Yes       Yes                      +----------+------------+---------+-----------+----------+-------+ Axillary      Full       Yes       Yes                      +----------+------------+---------+-----------+----------+-------+ Brachial      Full       Yes       Yes                      +----------+------------+---------+-----------+----------+-------+ Radial        Full                                          +----------+------------+---------+-----------+----------+-------+  Ulnar         Full                                          +----------+------------+---------+-----------+----------+-------+ Cephalic      Full                                          +----------+------------+---------+-----------+----------+-------+ Basilic       Full                                          +----------+------------+---------+-----------+----------+-------+  Summary:  Right: No evidence of thrombosis in the subclavian.  Left: No evidence of deep vein thrombosis in the upper extremity. No evidence of superficial vein thrombosis in the upper extremity.  *See table(s) above for measurements and observations.  Diagnosing  physician: Jamelle Haring Electronically signed by Jamelle Haring on 08/13/2021 at 8:14:54 PM.    Final     Medications: I have reviewed the patient's current medications.  Assessment/plan:  Metastatic esophageal cancer CT neck 07/29/2021-adenopathy at the thoracic inlet and lower bilateral jugular chains, esophagus mass, left vocal cord paralysis CT chest 07/29/2021-diffuse esophageal wall thickening, most pronounced in the proximal esophagus, multiple pulmonary nodules, mediastinal and supraclavicular lymphadenopathy Upper endoscopy 08/07/2021-mass/stricture extending from the UES to 30 cm CTs chest, abdomen, and pelvis 08/07/2021-long segment wall thickening of the esophagus, prominent proximally, multiple large supraclavicular mediastinal nodes, bilateral lung nodules,, and upper abdominal nodes similar to remote abdominal CT Biopsy 08/07/2021 showed invasive moderately differentiated squamous cell carcinoma   Solid/liquid dysphagia secondary to #1 -Gastrostomy tube placement 07/31/2021 Weight loss secondary to #1 Odynophagia secondary to #1 History of colon polyps-tubular adenomas History of a diverticular bleed-April 2020 Anemia Thrombocytosis Left subclavian Port-A-Cath placement 08/10/2021 Left upper extremity edema -Doppler ultrasound of left upper extremity 08/13/2021-negative for DVT  [name] appears unchanged.  He has persistent swelling in the left upper extremity.  Doppler ultrasound was negative for DVT.  However, he had a recent Port-A-Cath placed and recommend further evaluation.  May need CT chest versus venogram to further evaluate.  Will defer to hospitalist.  He continues to have dysphagia and odynophagia.  We discussed trying Roxanol sublingual to see if it controls his pain.  The patient has metastatic esophageal cancer.  He is receiving palliative radiation to his esophageal mass..  We will arrange for outpatient follow-up following hospital discharge to discuss systemic  treatment options.  We will likely proceed with FOLFOX/nivolumab.   Recommendations: 1.  Continue Roxanol 5 to 10 mg sublingual every 4 hours as needed. 2.  Consider CT chest versus CT venogram to evaluate left upper extremity edema. 3.  Continue palliative radiation under the care of Dr. Tammi Klippel 4.  Continue tube feeding.  Dietitian following. 5.  Outpatient follow-up will be scheduled at the cancer center. 6.  Consider stopping amlodipine if the blood pressure remains low  Okay to discharge from our standpoint once the upper extremity edema has been further evaluated.   LOS: 6 days   Mikey Bussing, NP   Mr. Gasparro was interviewed and examined.  He has persistent swelling of the left upper extremity.  A Doppler was - 08/13/2021.  I think he should have a CT or venogram to further assess for obstruction of the left-sided central vasculature.  Outpatient follow-up will be scheduled at the Cancer center.  I was present for greater than 50% of today's visit.  I performed medical decision making.    08/14/2021, 10:15 AM

## 2021-08-14 NOTE — Progress Notes (Signed)
PROGRESS NOTE  Leslie Livingston OBS:[id number] DOB: 01-Mar-1950 DOA: 08/07/2021 PCP: Lorene Dy, MD  Brief History   71 year old with past medical history significant for seasonal allergies, altered arthritis, hyperlipidemia, hypertension, tobacco and alcohol use presents complaining of dysphagia and odynophagia since last month, with imaging suspicious for malignant esophageal mass.  He lost about 15 pounds in the last month.   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  Bilateral pulmonary nodularity has mildly progressed over the last 9 days.  Supraclavicular and mediastinal adenopathy has not significantly changed.  No definitive metastatic disease in the abdomen or pelvis.  Mild prominent lymph nodes in the upper abdomen are unchanged from remote abdominal CT 2014, likely reactive.  Colonic diverticulosis without acute inflammation.   GI was consulted, patient underwent endoscopy 7/14, standard adult gastroscope could not pass into his very tortuous and narrow UES even after elective airway intubation for gagging and airway spasms, so a pediatric gastroscope was used to complete the exam.  Tight, 10 cm long circumferential proximal esophageal mass that extended from the UES to about 30 cm from the incisors.  The mass was friable, ulcerated and is most certainly a malignancy.   He was started on tube feeding 7/18. Goal rate is 55 cc /hr.   The patient has developed swelling of his left upper extremity and neck. Doppler of the left upper extremity is pending.   Plan is to discharge to home with home health RN and tube feeds.  Consultants  Medical oncology General surgery Gastroenterology Radiation oncology  Procedures  EGD with biopsy Surgical G tube placed on 08/11/2021 Port  placement Simulation of radiation treatment  Antibiotics   Anti-infectives (From admission, onward)    Start     Dose/Rate Route Frequency Ordered Stop   08/10/21 0900  ceFAZolin (ANCEF) IVPB 2g/100 mL premix        2 g 200 mL/hr over 30 Minutes Intravenous  Once 08/10/21 0852 08/10/21 1035      Subjective  The patient is resting comfortably. No new complaints.  Objective   Vitals:  Vitals:   08/14/21 0413 08/14/21 1451  BP: 97/64 132/63  Pulse: 68 64  Resp: 14   Temp: 99.4 F (37.4 C) 99.3 F (37.4 C)  SpO2: 95% 96%    Exam:  Constitutional:  Appears calm and comfortable Respiratory:  CTA bilaterally, no w/r/r.  Respiratory effort normal. No retractions or accessory muscle use Cardiovascular:  RRR, no m/r/g No LE extremity edema   Normal pedal pulses Abdomen:  Abdomen appears normal; no tenderness or masses No hernias No HSM Musculoskeletal:  Digits/nails BUE: no clubbing, cyanosis, petechiae, infection exam of joints, bones, muscles of at least one of following: head/neck, RUE, LUE, RLE, LLE   strength and tone normal, no atrophy, no abnormal movements No tenderness, masses Normal ROM, no contractures  gait and station Skin:  No rashes, lesions, ulcers palpation of skin: no induration or nodules Neurologic:  CN 2-12 intact Sensation all 4 extremities intact Psychiatric:  Mental status Mood, affect appropriate Orientation to person, place, time  judgment and insight appear intact     I have personally reviewed the following:   Today's Data  Vitals  Lab Data  BMP, Magnesium, Phosphorus CBC  Micro Data  Nasal Swab positive for staph aureus  Imaging  CT chest, abdomen and pelvis Doppler of the left upper extremity - pending.  Cardiology Data  EKG  Other Data  Biopsy from esophagus: Invasive, moderately differentiated Squamous cell CA  Scheduled Meds:  amLODipine  5 mg Oral Daily   feeding supplement (PROSource TF)  45 mL Per Tube  TID   free water  150 mL Per Tube Q4H   melatonin  3 mg Oral QHS   mupirocin ointment  1 Application Nasal BID   mouth rinse  15 mL Mouth Rinse 4 times per day   pantoprazole (PROTONIX) IV  40 mg Intravenous Q12H   Continuous Infusions:  feeding supplement (OSMOLITE 1.5 CAL) 1,000 mL (08/13/21 2048)   lactated ringers 100 mL/hr at 08/14/21 3200    Principal Problem:   Esophageal mass Active Problems:   Essential hypertension   Iron deficiency anemia due to chronic blood loss   Dyslipidemia   GERD (gastroesophageal reflux disease)   Malnutrition of moderate degree   Dysphagia   Squamous cell carcinoma, esophagus (HCC)   LOS: 6 days    A & P  Esophageal mass/Squamous cell CA of the esophagus: The patient underwent simulation of radiation treatment on 08/12/2021. He will have first treatment today. Systemic therapy to begin as outpatient. Surgical G-tube and port has been placed. The patient is receiving G-tube feeds which are being incrementally increased towards his goal rate of 55 cc/hr. Medical oncology and radiation oncology have been consulted.  Severe dysphagia/Moderate degree of malnutrition: The patient is receiving G-tube feeds which are being incrementally increased towards his goal rate of 55 cc/hr. He is thus far tolerating the feeds well.  Left Upper Extremity Swelling: Doppler of the left upper extremity is pending.  Iron Deficiency Anemia: Noted. Hemoglobin is stable at 9.7. Continue to monitor. Supplement iron orally.  Essential Hypertension: Patient's blood pressures are normotensive. No scheduled antihypertensives.  GERD: Continue PPI.  I have seen and examined this patient myself. I have spent 32 minutes in his evaluation and care.  DVT prophylaxis: SCD's Code Status: Full Code Family Communication: None available Disposition Plan: Home with home health    Zarea Diesing, DO [facility] Direct contact: see [email or web address]  7PM-7AM contact night  coverage as above 08/13/2021, 2:30 PM  LOS: 4 days

## 2021-08-14 NOTE — Care Management Important Message (Signed)
Important Message  Patient Details IM Letter placed in Patients room. Name: Leslie Livingston MRN: [id number] Date of Birth: May 21, 1950   Medicare Important Message Given:  Yes     Kerin Salen 08/14/2021, 11:43 AM

## 2021-08-14 NOTE — Progress Notes (Signed)
PROGRESS NOTE  Leslie Livingston ZSW:[id number] DOB: 01-09-1951 DOA: 08/07/2021 PCP: Lorene Dy, MD  Brief History   71 year old with past medical history significant for seasonal allergies, altered arthritis, hyperlipidemia, hypertension, tobacco and alcohol use presents complaining of dysphagia and odynophagia since last month, with imaging suspicious for malignant esophageal mass.  He lost about 15 pounds in the last month.   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  Bilateral pulmonary nodularity has mildly progressed over the last 9 days.  Supraclavicular and mediastinal adenopathy has not significantly changed.  No definitive metastatic disease in the abdomen or pelvis.  Mild prominent lymph nodes in the upper abdomen are unchanged from remote abdominal CT 2014, likely reactive.  Colonic diverticulosis without acute inflammation.   GI was consulted, patient underwent endoscopy 7/14, standard adult gastroscope could not pass into his very tortuous and narrow UES even after elective airway intubation for gagging and airway spasms, so a pediatric gastroscope was used to complete the exam.  Tight, 10 cm long circumferential proximal esophageal mass that extended from the UES to about 30 cm from the incisors.  The mass was friable, ulcerated and is most certainly a malignancy.   He was started on tube feeding 7/18. Goal rate is 55 cc /hr.   The patient has developed swelling of his left upper extremity and neck. Doppler of the left upper extremity was negative for DVT. CTA chest is ordered to rule out subclavian thrombosis or thrombosis related to the patient's port.   Plan is to discharge to home with home health RN and tube feeds.  Consultants  Medical oncology General  surgery Gastroenterology Radiation oncology  Procedures  EGD with biopsy Surgical G tube placed on 08/11/2021 Port placement Simulation of radiation treatment  Antibiotics   Anti-infectives (From admission, onward)    Start     Dose/Rate Route Frequency Ordered Stop   08/10/21 0900  ceFAZolin (ANCEF) IVPB 2g/100 mL premix        2 g 200 mL/hr over 30 Minutes Intravenous  Once 08/10/21 0852 08/10/21 1035      Subjective  The patient is resting comfortably. No new complaints.  Objective   Vitals:  Vitals:   08/14/21 0413 08/14/21 1451  BP: 97/64 132/63  Pulse: 68 64  Resp: 14   Temp: 99.4 F (37.4 C) 99.3 F (37.4 C)  SpO2: 95% 96%    Exam:  Constitutional:  Appears calm and comfortable Respiratory:  CTA bilaterally, no w/r/r.  Respiratory effort normal. No retractions or accessory muscle use Cardiovascular:  RRR, no m/r/g No LE extremity edema   Normal pedal pulses Abdomen:  Abdomen appears normal; no tenderness or masses No hernias No HSM Musculoskeletal:  Left upper extremity is much more swollen than yesterday.  Otherwise, no cyanosis, clubbing, or edema of other limbs. Skin:  No rashes, lesions, ulcers palpation of skin: no induration or nodules Neurologic:  CN 2-12 intact Sensation all 4 extremities intact Psychiatric:  Mental status Mood, affect appropriate Orientation to person, place, time  judgment and insight appear intact    I have personally reviewed the following:   Today's Data  Vitals  Lab Data    Micro Data  Nasal Swab positive for staph aureus  Imaging  CT chest, abdomen and pelvis Doppler of the left upper extremity CTA  chest  Cardiology Data  EKG  Other Data  Biopsy from esophagus: Invasive, moderately differentiated Squamous cell CA  Scheduled Meds:  amLODipine  5 mg Oral Daily   feeding supplement (PROSource TF)  45 mL Per Tube TID   free water  150 mL Per Tube Q4H   melatonin  3 mg Oral QHS   mupirocin  ointment  1 Application Nasal BID   mouth rinse  15 mL Mouth Rinse 4 times per day   pantoprazole (PROTONIX) IV  40 mg Intravenous Q12H   Continuous Infusions:  feeding supplement (OSMOLITE 1.5 CAL) 1,000 mL (08/13/21 2048)   lactated ringers 100 mL/hr at 08/14/21 7353    Principal Problem:   Esophageal mass Active Problems:   Essential hypertension   Iron deficiency anemia due to chronic blood loss   Dyslipidemia   GERD (gastroesophageal reflux disease)   Malnutrition of moderate degree   Dysphagia   Squamous cell carcinoma, esophagus (HCC)   LOS: 6 days    A & P  Esophageal mass/Squamous cell CA of the esophagus: The patient underwent simulation of radiation treatment on 08/12/2021. He will have first treatment today. Systemic therapy to begin as outpatient. Surgical G-tube and port has been placed. The patient is receiving G-tube feeds which are being incrementally increased towards his goal rate of 55 cc/hr. Medical oncology and radiation oncology have been consulted.  Severe dysphagia/Moderate degree of malnutrition: The patient is receiving G-tube feeds which are being incrementally increased towards his goal rate of 55 cc/hr. He is thus far tolerating the feeds well.  Left Upper Extremity Swelling: Doppler of the left upper extremity was negative for DVT. Have ordered CTA of the chest to examine for possible subclavian thrombosis or possible thrombosis related to patient's recent port placement.   Iron Deficiency Anemia: Noted. Hemoglobin is stable at 9.7. Continue to monitor. Supplement iron orally.  Essential Hypertension: Patient's blood pressures are normotensive. No scheduled antihypertensives.  GERD: Continue PPI.  I have seen and examined this patient myself. I have spent 34 minutes in his evaluation and care.  DVT prophylaxis: SCD's Code Status: Full Code Family Communication: None available Disposition Plan: Home with home health    Maureen Duesing, DO [facility] Direct contact: see [email or web address]  7PM-7AM contact night coverage as above 08/14/2021, 4:49 PM  LOS: 4 days

## 2021-08-15 DIAGNOSIS — K2289 Other specified disease of esophagus: Secondary | ICD-10-CM | POA: Diagnosis not present

## 2021-08-15 LAB — GLUCOSE, CAPILLARY
Glucose-Capillary: 136 mg/dL — ABNORMAL HIGH (ref 70–99)
Glucose-Capillary: 138 mg/dL — ABNORMAL HIGH (ref 70–99)
Glucose-Capillary: 120 mg/dL — ABNORMAL HIGH (ref 70–99)
Glucose-Capillary: 149 mg/dL — ABNORMAL HIGH (ref 70–99)
Glucose-Capillary: 129 mg/dL — ABNORMAL HIGH (ref 70–99)

## 2021-08-15 NOTE — Progress Notes (Signed)
PROGRESS NOTE  Leslie Livingston DVV:[id number] DOB: 01-Jul-1950 DOA: 08/07/2021 PCP: Lorene Dy, MD  Brief History   71 year old with past medical history significant for seasonal allergies, altered arthritis, hyperlipidemia, hypertension, tobacco and alcohol use presents complaining of dysphagia and odynophagia since last month, with imaging suspicious for malignant esophageal mass.  He lost about 15 pounds in the last month.   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  Bilateral pulmonary nodularity has mildly progressed over the last 9 days.  Supraclavicular and mediastinal adenopathy has not significantly changed.  No definitive metastatic disease in the abdomen or pelvis.  Mild prominent lymph nodes in the upper abdomen are unchanged from remote abdominal CT 2014, likely reactive.  Colonic diverticulosis without acute inflammation.   GI was consulted, patient underwent endoscopy 7/14, standard adult gastroscope could not pass into his very tortuous and narrow UES even after elective airway intubation for gagging and airway spasms, so a pediatric gastroscope was used to complete the exam.  Tight, 10 cm long circumferential proximal esophageal mass that extended from the UES to about 30 cm from the incisors.  The mass was friable, ulcerated and is most certainly a malignancy.   He was started on tube feeding 7/18. Goal rate is 55 cc /hr.   The patient has developed swelling of his left upper extremity and neck. Doppler of the left upper extremity was negative for DVT. Venogram of the chest ruled out subclavian thrombosis or thrombosis related to the patient's port.   Plan is to discharge to home with home health RN and tube feeds.  Consultants  Medical oncology General  surgery Gastroenterology Radiation oncology  Procedures  EGD with biopsy Surgical G tube placed on 08/11/2021 Port placement Simulation of radiation treatment  Antibiotics   Anti-infectives (From admission, onward)    Start     Dose/Rate Route Frequency Ordered Stop   08/10/21 0900  ceFAZolin (ANCEF) IVPB 2g/100 mL premix        2 g 200 mL/hr over 30 Minutes Intravenous  Once 08/10/21 0852 08/10/21 1035      Subjective  The patient is resting comfortably. No new complaints.  Objective   Vitals:  Vitals:   08/15/21 0418 08/15/21 1319  BP: (!) 146/74 127/62  Pulse: 69 67  Resp: 16   Temp: 97.7 F (36.5 C) 98.7 F (37.1 C)  SpO2: 96% 97%    Exam:  Constitutional:  Appears calm and comfortable Respiratory:  CTA bilaterally, no w/r/r.  Respiratory effort normal. No retractions or accessory muscle use Cardiovascular:  RRR, no m/r/g No LE extremity edema   Normal pedal pulses Abdomen:  Abdomen appears normal; no tenderness or masses No hernias No HSM Musculoskeletal:  Left upper extremity is much more swollen than yesterday.  Otherwise, no cyanosis, clubbing, or edema of other limbs. Skin:  No rashes, lesions, ulcers palpation of skin: no induration or nodules Neurologic:  CN 2-12 intact Sensation all 4 extremities intact Psychiatric:  Mental status Mood, affect appropriate Orientation to person, place, time  judgment and insight appear intact    I have personally reviewed the following:   Today's Data  Vitals  Lab Data    Micro Data  Nasal Swab positive for staph aureus  Imaging  CT chest, abdomen and pelvis Doppler of the left upper extremity CTA  chest  Cardiology Data  EKG  Other Data  Biopsy from esophagus: Invasive, moderately differentiated Squamous cell CA  Scheduled Meds:  amLODipine  5 mg Oral Daily   feeding supplement (PROSource TF)  45 mL Per Tube TID   free water  150 mL Per Tube Q4H   mouth rinse  15 mL Mouth Rinse 4  times per day   pantoprazole (PROTONIX) IV  40 mg Intravenous Q12H   Continuous Infusions:  feeding supplement (OSMOLITE 1.5 CAL) 55 mL/hr at 08/15/21 1619   lactated ringers 20 mL/hr at 08/15/21 1619    Principal Problem:   Esophageal mass Active Problems:   Essential hypertension   Iron deficiency anemia due to chronic blood loss   Dyslipidemia   GERD (gastroesophageal reflux disease)   Malnutrition of moderate degree   Dysphagia   Squamous cell carcinoma, esophagus (HCC)   LOS: 7 days    A & P  Esophageal mass/Squamous cell CA of the esophagus: The patient underwent simulation of radiation treatment on 08/12/2021. He will have first treatment today. Systemic therapy to begin as outpatient. Surgical G-tube and port has been placed. The patient is receiving G-tube feeds which are being incrementally increased towards his goal rate of 55 cc/hr. Medical oncology and radiation oncology have been consulted.  Severe dysphagia/Moderate degree of malnutrition: The patient is receiving G-tube feeds which are being incrementally increased towards his goal rate of 55 cc/hr. He is thus far tolerating the feeds well.  Left Upper Extremity Swelling: Doppler of the left upper extremity was negative for DVT. Have ordered CTA of the chest was negative for thrombosis. Swelling is improved today.  Iron Deficiency Anemia: Noted. Hemoglobin is stable at 9.7. Continue to monitor. Supplement iron orally.  Essential Hypertension: Patient's blood pressures are normotensive. No scheduled antihypertensives.  GERD: Continue PPI.  I have seen and examined this patient myself. I have spent 32 minutes in his evaluation and care.  DVT prophylaxis: SCD's Code Status: Full Code Family Communication: None available Disposition Plan: Home with home health and tube feeds.   Ezell Melikian, DO [facility] Direct contact: see [email or web address]  7PM-7AM contact night coverage as above 08/15/2021, 4:49 PM  LOS:  4 days

## 2021-08-16 DIAGNOSIS — K2289 Other specified disease of esophagus: Secondary | ICD-10-CM | POA: Diagnosis not present

## 2021-08-16 LAB — CBC WITH DIFFERENTIAL/PLATELET
Abs Immature Granulocytes: 0.02 10*3/uL (ref 0.00–0.07)
Basophils Absolute: 0 10*3/uL (ref 0.0–0.1)
Basophils Relative: 0 %
Eosinophils Absolute: 0.2 10*3/uL (ref 0.0–0.5)
Eosinophils Relative: 3 %
HCT: 27.8 % — ABNORMAL LOW (ref 39.0–52.0)
Hemoglobin: 8.9 g/dL — ABNORMAL LOW (ref 13.0–17.0)
Immature Granulocytes: 0 %
Lymphocytes Relative: 18 %
Lymphs Abs: 1.2 10*3/uL (ref 0.7–4.0)
MCH: 28.3 pg (ref 26.0–34.0)
MCHC: 32 g/dL (ref 30.0–36.0)
MCV: 88.3 fL (ref 80.0–100.0)
Monocytes Absolute: 0.9 10*3/uL (ref 0.1–1.0)
Monocytes Relative: 13 %
Neutro Abs: 4.5 10*3/uL (ref 1.7–7.7)
Neutrophils Relative %: 66 %
Platelets: 335 10*3/uL (ref 150–400)
RBC: 3.15 MIL/uL — ABNORMAL LOW (ref 4.22–5.81)
RDW: 13 % (ref 11.5–15.5)
WBC: 6.9 10*3/uL (ref 4.0–10.5)
nRBC: 0 % (ref 0.0–0.2)

## 2021-08-16 LAB — COMPREHENSIVE METABOLIC PANEL
ALT: 14 U/L (ref 0–44)
AST: 11 U/L — ABNORMAL LOW (ref 15–41)
Albumin: 2.7 g/dL — ABNORMAL LOW (ref 3.5–5.0)
Alkaline Phosphatase: 58 U/L (ref 38–126)
Anion gap: 7 (ref 5–15)
BUN: 11 mg/dL (ref 8–23)
CO2: 27 mmol/L (ref 22–32)
Calcium: 8.7 mg/dL — ABNORMAL LOW (ref 8.9–10.3)
Chloride: 102 mmol/L (ref 98–111)
Creatinine, Ser: 0.78 mg/dL (ref 0.61–1.24)
GFR, Estimated: >60 mL/min (ref >60)
Glucose, Bld: 140 mg/dL — ABNORMAL HIGH (ref 70–99)
Potassium: 3.9 mmol/L (ref 3.5–5.1)
Sodium: 136 mmol/L (ref 135–145)
Total Bilirubin: 0.4 mg/dL (ref 0.3–1.2)
Total Protein: 6.4 g/dL — ABNORMAL LOW (ref 6.5–8.1)

## 2021-08-16 LAB — GLUCOSE, CAPILLARY
Glucose-Capillary: 106 mg/dL — ABNORMAL HIGH (ref 70–99)
Glucose-Capillary: 121 mg/dL — ABNORMAL HIGH (ref 70–99)
Glucose-Capillary: 126 mg/dL — ABNORMAL HIGH (ref 70–99)
Glucose-Capillary: 126 mg/dL — ABNORMAL HIGH (ref 70–99)
Glucose-Capillary: 142 mg/dL — ABNORMAL HIGH (ref 70–99)

## 2021-08-16 MED ORDER — PROSOURCE TF PO LIQD
45.0000 mL | Freq: Three times a day (TID) | ORAL | 0 refills | Status: DC
  Filled 2021-08-16: qty 45, 1d supply, fill #0

## 2021-08-16 MED ORDER — OSMOLITE 1.5 CAL PO LIQD
1000.0000 mL | ORAL | 0 refills | Status: DC
  Filled 2021-08-16: qty 1000, fill #0

## 2021-08-16 MED ORDER — MORPHINE SULFATE (CONCENTRATE) 10 MG/0.5ML PO SOLN
5.0000 mg | ORAL | 0 refills | Status: DC | PRN
  Filled 2021-08-16: qty 90, 30d supply, fill #0

## 2021-08-16 MED ORDER — PANTOPRAZOLE SODIUM 40 MG PO PACK
40.0000 mg | PACK | Freq: Every day | ORAL | 0 refills | Status: DC
  Filled 2021-08-16: qty 30, 30d supply, fill #0

## 2021-08-16 MED ORDER — AMLODIPINE BESYLATE 5 MG PO TABS
5.0000 mg | ORAL_TABLET | Freq: Every day | ORAL | 0 refills | Status: DC
  Filled 2021-08-16: qty 30, 30d supply, fill #0

## 2021-08-16 MED ORDER — FREE WATER
150.0000 mL | 0 refills | Status: DC
  Filled 2021-08-16: qty 150, 1d supply, fill #0

## 2021-08-16 MED ORDER — OSMOLITE 1.5 CAL PO LIQD
1000.0000 mL | ORAL | Status: DC
Start: 2021-08-16 — End: 2021-08-17

## 2021-08-16 NOTE — Assessment & Plan Note (Signed)
As per medical and radiation oncology.

## 2021-08-16 NOTE — Progress Notes (Signed)
Brief Nutrition Note  Pt to be discharged home on TF this afternoon. Has been tolerating Osmolite 1.5 well. Difficulty obtaining protein modular in preparation for discharge. Will adjust order to make up for the loss on nutrition from modular and ensure pt is receiving adequate nutrition while undergoing treatment for his cancer. Would benefit from outpatient follow-up.  Estimated Nutrition Needs: Energy: 2400-2600 kcal/d Protein: 85-100g Fluid 59m/kcal   Recommend the following: If continuous feeds are desired: Osmolite 1.5 @ 65 ml/hr  Free water flush 1253mh q4h This regimen will provide 2340kcal, 98g of protein and 193969mf free water (TF+flush)  If bolus feeds are preferred, recommend: 6.5 cartons of Osmolite 1.5/d (3 bolus feeds 1.5 cartons, 1 bolus of two cartons) Free water flush 120m63mfore and after each bolus This regimen will provide 2307kcal, 97g of protein and 2202mL58mfree water (TF+flush)  RacheRanell Patrick LDN Clinical Dietitian RD pager # available in AMION  After hours/weekend pager # available [facility]

## 2021-08-16 NOTE — Progress Notes (Signed)
Notified daughter [name] that pt has been discharged. She states that she can come and pick up pt when she closes her restaurant at 7 pm. Asked about other daughter and [name] states that she lives in [place] and has just had surgery and is unable to help. TOC working to finalize details for D/C.

## 2021-08-16 NOTE — Assessment & Plan Note (Signed)
Follow up with oncology as outpatient. Continue radiation therapy as scheduled.

## 2021-08-16 NOTE — Discharge Summary (Addendum)
Physician Discharge Summary   Patient: Leslie Livingston MRN: [id number] DOB: 1950-05-19  Admit date:     08/07/2021  Discharge date: 08/16/21  Discharge Physician: Karie Kirks   PCP: Lorene Dy, MD   Recommendations at discharge:    Discharge to home with home health NPO Follow up with PCP in 7-10 days. Follow up with oncology as directed. Follow up with radiation oncology and keep appointments for future therapy.  Discharge Diagnoses: Principal Problem:   Esophageal mass Active Problems:   Essential hypertension   Iron deficiency anemia due to chronic blood loss   Dyslipidemia   GERD (gastroesophageal reflux disease)   Malnutrition of moderate degree   Dysphagia   Squamous cell carcinoma, esophagus (HCC)  Resolved Problems:   * No resolved hospital problems. *  Hospital Course: 72 year old with past medical history significant for seasonal allergies, altered arthritis, hyperlipidemia, hypertension, tobacco and alcohol use presents complaining of dysphagia and odynophagia since last month, with imaging suspicious for malignant esophageal mass.  He lost about 15 pounds in the last month.   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  Bilateral pulmonary nodularity has mildly progressed over the last 9 days.  Supraclavicular and mediastinal adenopathy has not significantly changed.  No definitive metastatic disease in the abdomen or pelvis.  Mild prominent lymph nodes in the upper abdomen are unchanged from remote abdominal CT 2014, likely reactive.  Colonic diverticulosis without acute inflammation.   GI was consulted, patient underwent endoscopy 7/14, standard adult gastroscope could not pass into his very tortuous and narrow UES even after elective airway intubation for  gagging and airway spasms, so a pediatric gastroscope was used to complete the exam.  Tight, 10 cm long circumferential proximal esophageal mass that extended from the UES to about 30 cm from the incisors.  The mass was friable, ulcerated and is most certainly a malignancy.   He was started on tube feeding 7/18. Goal rate is 55 cc /hr.    The patient has developed swelling of his left upper extremity and neck. Doppler of the left upper extremity was negative for DVT. Venogram of the chest ruled out subclavian thrombosis or thrombosis related to the patient's port.    Plan is to discharge to home with home health RN and tube feeds.  Assessment and Plan: Assessment and Plan: * Esophageal mass Follow up with oncology as outpatient. Continue radiation therapy as scheduled.  Squamous cell carcinoma, esophagus (HCC) As per medical and radiation oncology.  Dysphagia Tube feeds only.  Malnutrition of moderate degree Continue tube feeds with protein supplements.  GERD (gastroesophageal reflux disease) Protonix per tube.  Dyslipidemia Deferred to PCP.  Iron deficiency anemia due to chronic blood loss Noted.  Essential hypertension The patient is normotensive on amlodipine 5 mg daily.  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  Consultants:  Radiation oncology   Medical Oncology   General Surgery   Gastroenterology    Procedures performed:  G- Tube placement EGD  Disposition: Home health Diet recommendation:  NPO Tube feeds DISCHARGE MEDICATION: Allergies as of 08/16/2021   No Known Allergies      Medication List     STOP taking these medications    Advil 200 MG tablet Generic drug: ibuprofen   HYDROcodone-acetaminophen 5-325 MG tablet Commonly known as: NORCO/VICODIN  lisinopril 20 MG tablet Commonly known as: ZESTRIL   loratadine 10 MG tablet Commonly known as: CLARITIN   naproxen 375 MG tablet Commonly known as: NAPROSYN       TAKE these  medications    amLODipine 5 MG tablet Commonly known as: NORVASC Take 1 tablet (5 mg total) by mouth daily. Start taking on: August 17, 2021 What changed:  when to take this reasons to take this   feeding supplement (OSMOLITE 1.5 CAL) Liqd Place 1,000 mLs into feeding tube continuous. Rate is 55 cc/hr.   feeding supplement (PROSource TF) liquid Place 45 mLs into feeding tube 3 (three) times daily. Rate is 55 cc /hr.   feeding supplement (OSMOLITE 1.5 CAL) Liqd Place 1,000 mLs into feeding tube continuous. Rate: 55 cc/hr.   free water Soln Place 150 mLs into feeding tube every 4 (four) hours. Rate is 55 cc/ hr.   morphine CONCENTRATE 10 MG/0.5ML Soln concentrated solution Place 0.25-0.5 mLs (5-10 mg total) under the tongue every 4 (four) hours as needed for severe pain.   pantoprazole sodium 40 mg Commonly known as: PROTONIX Place 40 mg into feeding tube daily.        Follow-up Information     Sur[facility], [place]. Schedule an appointment as soon as possible for a visit in 2 week(s).   Specialty: General Surgery Why: for staple removal Contact information: [address] [place] [address] [phone]         [facility] Follow up.   Specialty: Home Health Services Why: Alvis Lemmings will provide nursing after discharge to assist with home tube feedings. Contact information: [address] [phone]         [facility] Follow up.   Why: [employer] will be providing tube feedings after discharge.               Discharge Exam: Filed Weights   08/14/21 0412 08/15/21 0500 08/16/21 0428  Weight: 101.4 kg 100 kg 100 kg   Vitals:   08/16/21 0428 08/16/21 1331  BP: (!) 119/50 (!) 110/58  Pulse: 64 61  Resp: 16 18  Temp: 98.7 F (37.1 C) 99 F (37.2 C)  SpO2: 95% 97%   Exam:  Constitutional:  The patient is awake, alert, and oriented x 3. No acute distress. Respiratory:  No  increased work of breathing. No wheezes, rales, or rhonchi No tactile fremitus Cardiovascular:  Regular rate and rhythm No murmurs, ectopy, or gallups. No lateral PMI. No thrills. Abdomen:  Abdomen is soft, non-tender, non-distended No hernias, masses, or organomegaly Normoactive bowel sounds.  Musculoskeletal:  No cyanosis, clubbing, or edema Skin:  No rashes, lesions, ulcers palpation of skin: no induration or nodules Neurologic:  CN 2-12 intact Sensation all 4 extremities intact Psychiatric:  Mental status Mood, affect appropriate Orientation to person, place, time  judgment and insight appear intact   Condition at discharge: fair  The results of significant diagnostics from this hospitalization (including imaging, microbiology, ancillary and laboratory) are listed below for reference.   Imaging Studies: CT VENOGRAM CHEST  Result Date: 08/14/2021 CLINICAL DATA:  Arm swelling. Left subclavian surgically placed port catheter. EXAM: CT VENOGRAM CHEST TECHNIQUE: Multidetector CT imaging of the chest was performed using the standard venous protocol during bolus administration of intravenous contrast. Multiplanar CT image reconstructions and MIPs were obtained to evaluate the vascular anatomy RADIATION DOSE REDUCTION: This exam was performed according to the departmental dose-optimization program which includes  automated exposure control, adjustment of the mA and/or kV according to patient size and/or use of iterative reconstruction technique. CONTRAST:  166m OMNIPAQUE IOHEXOL 350 MG/ML SOLN COMPARISON:  08/07/2021 FINDINGS: VENOUS Visualized central aspect of bilateral internal jugular veins patent. Bilateral axillary and subclavian veins patent. Left subclavian port catheter extends to the proximal SVC. There is tapered narrowing in the mid left brachiocephalic vein, but no intraluminal thrombus nor significant enhancing collateral venous channels to suggest hemodynamic significance.  SVC widely patent. Cardiovascular: Heart size normal. No pericardial effusion. Scattered coronary calcifications. Mediastinum/Nodes: Marked circumferential thickening in the proximal thoracic esophagus. Bilateral supraclavicular and mediastinal adenopathy as before. 1.8 cm right hilar lymph node. Lungs/Pleura: Bilateral small pleural effusions have developed. Multiple pulmonary nodules in all lobes as before. Upper Abdomen: No acute findings. Musculoskeletal: Anterior vertebral endplate spurring at multiple levels in the mid and lower thoracic spine. 1.4 cm lytic lesion in the posterior aspect of the T5 vertebral body (previously 0.8 cm on 07/29/2021) without compression deformity. Review of the MIP images confirms the above findings. IMPRESSION: 1. No evidence of central venous thrombosis or stenosis post subclavian port catheter placement. 2. Proximal esophageal mass with pulmonary nodules, mediastinal, right hilar and supraclavicular adenopathy as before. 3. Enlarging probable metastasis in the T5 vertebral body without fracture. 4. Coronary and Aortic Atherosclerosis (ICD10-170.0). Electronically Signed   By: DLucrezia EuropeM.D.   On: 08/14/2021 16:42   VAS [place]PPER EXTREMITY VENOUS DUPLEX  Result Date: 08/13/2021 UPPER VENOUS STUDY  Patient Name:  RTIMMEY LAMBA Date of Exam:   08/13/2021 Medical Rec #: [id number]         Accession #:    [id number] of Birth: 919-Aug-1952          Patient Gender: M Patient Age:   76years [facility]      VAS [place]ER EXTREMITY VENOUS DUPLEX Referring Phys: KMikey Bussing--------------------------------------------------------------------------------  Indications: Swelling Risk Factors: Surgery. Comparison Study: No prior studies. Performing Technologist: GOliver HumRVT  Examination Guidelines: A complete evaluation includes B-mode imaging, spectral Doppler, color Doppler, and power Doppler as needed of all accessible portions of  each vessel. Bilateral testing is considered an integral part of a complete examination. Limited examinations for reoccurring indications may be performed as noted.  Right Findings: +----------+------------+---------+-----------+----------+-------+ RIGHT     CompressiblePhasicitySpontaneousPropertiesSummary +----------+------------+---------+-----------+----------+-------+ Subclavian    Full       Yes       Yes                      +----------+------------+---------+-----------+----------+-------+  Left Findings: +----------+------------+---------+-----------+----------+-------+ LEFT      CompressiblePhasicitySpontaneousPropertiesSummary +----------+------------+---------+-----------+----------+-------+ IJV           Full       Yes       Yes                      +----------+------------+---------+-----------+----------+-------+ Subclavian    Full       Yes       Yes                      +----------+------------+---------+-----------+----------+-------+ Axillary      Full       Yes       Yes                      +----------+------------+---------+-----------+----------+-------+ Brachial  Full       Yes       Yes                      +----------+------------+---------+-----------+----------+-------+ Radial        Full                                          +----------+------------+---------+-----------+----------+-------+ Ulnar         Full                                          +----------+------------+---------+-----------+----------+-------+ Cephalic      Full                                          +----------+------------+---------+-----------+----------+-------+ Basilic       Full                                          +----------+------------+---------+-----------+----------+-------+  Summary:  Right: No evidence of thrombosis in the subclavian.  Left: No evidence of deep vein thrombosis in the upper extremity. No evidence  of superficial vein thrombosis in the upper extremity.  *See table(s) above for measurements and observations.  Diagnosing physician: Jamelle Haring Electronically signed by Jamelle Haring on 08/13/2021 at 8:14:54 PM.    Final    DG C-Arm 1-60 Min-No Report  Result Date: 08/10/2021 Fluoroscopy was utilized by the requesting physician.  No radiographic interpretation.   CT CHEST ABDOMEN PELVIS W CONTRAST  Result Date: 08/07/2021 CLINICAL DATA:  Esophageal mass.  Staging.  * Tracking Code: BO * EXAM: CT CHEST, ABDOMEN, AND PELVIS WITH CONTRAST TECHNIQUE: Multidetector CT imaging of the chest, abdomen and pelvis was performed following the standard protocol during bolus administration of intravenous contrast. RADIATION DOSE REDUCTION: This exam was performed according to the departmental dose-optimization program which includes automated exposure control, adjustment of the mA and/or kV according to patient size and/or use of iterative reconstruction technique. CONTRAST:  113m OMNIPAQUE IOHEXOL 300 MG/ML  SOLN COMPARISON:  CT neck and chest 07/29/2021. Abdominopelvic CT 06/15/2012. FINDINGS: CT CHEST FINDINGS Cardiovascular: No acute vascular findings. Three-vessel coronary artery atherosclerosis with lesser involvement of the aorta and great vessels. The heart size is normal. There is no pericardial effusion. Mediastinum/Nodes: Long segment wall thickening of the esophagus is again noted, most prominent proximally. Multiple enlarged supraclavicular and mediastinal lymph nodes are again noted bilaterally. A high right paraesophageal node measures 1.5 cm short axis on image 13/2, similar to recent prior study. Subcarinal nodes measure up to 1.3 cm short axis on image 34/2. There are small hilar lymph nodes bilaterally. No axillary adenopathy. No abnormality of the thyroid gland or trachea seen. Lungs/Pleura: No pleural effusion or pneumothorax. Bilateral pulmonary nodularity again noted. A previously referenced  nodule medially in the left lower measures 2.4 x 2.2 cm on image 125/4 (previously 2.1 cm maximally). Right middle lobe nodule currently measures 2.0 cm on image 84/4 and demonstrates central cavitation (previously 1.9 cm). Increased subsegmental atelectasis posteriorly in the left upper lobe. Musculoskeletal/Chest wall: No  chest wall mass or suspicious osseous findings. Multilevel spondylosis. CT ABDOMEN AND PELVIS FINDINGS Hepatobiliary: The liver is normal in density without suspicious focal abnormality. No evidence of gallstones, gallbladder wall thickening or biliary dilatation. Pancreas: Unremarkable. No pancreatic ductal dilatation or surrounding inflammatory changes. Spleen: Normal in size without focal abnormality. Adrenals/Urinary Tract: Both adrenal glands appear normal. 1.2 cm cyst in the posterior interpolar region of the left kidney on image 16/7, similar to remote abdominal CT; no follow-up imaging recommended. No evidence of enhancing renal mass, urinary tract calculus or hydronephrosis. The bladder is mildly distended without focal abnormality. Stomach/Bowel: No enteric contrast administered. The stomach appears unremarkable for its degree of distension. No evidence of bowel wall thickening, distention or surrounding inflammatory change. Diverticular changes throughout the colon. The appendix appears normal. Vascular/Lymphatic: Prominent lymph nodes in the upper abdomen are similar to the remote abdominal CT, including a 9 mm gastrohepatic ligament node on image 66/2 and an 8 mm portacaval node on image 69/2. No progressive abdominopelvic adenopathy identified. Aortic and branch vessel atherosclerosis without aneurysm or large vessel occlusion. Reproductive: The prostate gland and seminal vesicles appear unremarkable. Other: No ascites or peritoneal nodularity. Small umbilical hernia containing only fat. Asymmetric fat in the right inguinal canal. Musculoskeletal: No acute or significant osseous  findings. Multilevel lumbar spondylosis. Moderately advanced asymmetric right hip osteoarthritis. IMPRESSION: 1. 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 Bilateral pulmonary nodularity has mildly progressed over the last 9 days. Supraclavicular and mediastinal adenopathy has not significantly changed. 2. No definite metastatic disease in the abdomen or pelvis. Mildly prominent lymph nodes in the upper abdomen are unchanged from remote abdominal CT of 2014, likely reactive. 3. Colonic diverticulosis without acute inflammation. 4. Coronary and Aortic Atherosclerosis (ICD10-I70.0). Electronically Signed   By: Richardean Sale M.D.   On: 08/07/2021 16:30   CT CHEST WO CONTRAST  Result Date: 07/30/2021 CLINICAL DATA:  Right midlung opacity, hoarseness.  Former smoker. EXAM: CT CHEST WITHOUT CONTRAST TECHNIQUE: Multidetector CT imaging of the chest was performed following the standard protocol without IV contrast. RADIATION DOSE REDUCTION: This exam was performed according to the departmental dose-optimization program which includes automated exposure control, adjustment of the mA and/or kV according to patient size and/or use of iterative reconstruction technique. COMPARISON:  06/10/2021. FINDINGS: Cardiovascular: The heart is normal in size and there is a trace pericardial effusion. Multi-vessel coronary artery calcifications are noted. There is atherosclerotic calcification of the aorta without evidence of aneurysm. The pulmonary trunk is normal in caliber. Mediastinum/Nodes: Enlarged lymph nodes are present in the mediastinum measuring up to 1.5 cm in the right paratracheal space. Enlarged supraclavicular lymph nodes are seen bilaterally. There is a suspected left axillary lymphadenopathy. No axillary lymphadenopathy is seen. The thyroid gland and trachea are within normal limits. There is diffuse esophageal wall thickening, most pronounced in the proximal esophagus.  Lungs/Pleura: The airways are patent. Multiple pulmonary nodules are present bilaterally measuring up to 2.1 cm in the left lower lobe medially, axial image 128. The largest nodule on the right measures 1.9 cm in the right middle lobe, axial image 81. No effusion or pneumothorax. Mild atelectasis is present in the posterior aspect of the left upper lobe. Upper Abdomen: There is fatty infiltration of the gastric wall suggesting chronic inflammatory changes. No adrenal nodule or mass. No acute abnormality. Musculoskeletal: Degenerative changes are present in the cervical and thoracic spine. No suspicious osseous abnormality or acute fracture. IMPRESSION: 1. Diffuse esophageal wall thickening, most  pronounced in the proximal esophagus, concerning for malignancy. Endoscopy with tissue sampling is recommended for definitive diagnosis. 2. Multiple pulmonary nodules bilaterally measuring up to 2.1 cm, highly suspicious for metastatic disease. Tissue sampling or PET-CT is suggested for further evaluation. 3. Mediastinal and supraclavicular lymphadenopathy, suspicious for metastatic disease. 4. Coronary artery calcifications. 5. Aortic atherosclerosis. Electronically Signed   By: Brett Fairy M.D.   On: 07/30/2021 03:23   CT SOFT TISSUE NECK W CONTRAST  Result Date: 07/29/2021 CLINICAL DATA:  Hoarseness in throat pain with vocal cord paralysis for 3 months. EXAM: CT NECK WITH CONTRAST TECHNIQUE: Multidetector CT imaging of the neck was performed using the standard protocol following the bolus administration of intravenous contrast. RADIATION DOSE REDUCTION: This exam was performed according to the departmental dose-optimization program which includes automated exposure control, adjustment of the mA and/or kV according to patient size and/or use of iterative reconstruction technique. CONTRAST:  58m ISOVUE-300 IOPAMIDOL (ISOVUE-300) INJECTION 61% COMPARISON:  None Available. FINDINGS: Pharynx and larynx: No evidence of  mass or inflammation. Enlarged left laryngeal ventricle and piriform sinus attributed to vocal cord paresis on the left. The underlying cause is attributed to a large mass circumferentially affecting the upper esophagus. Salivary glands: Asymmetric lobulation upward and posterior from the right submandibular gland, but isodense to parenchyma and not convincing for mass on reformats. This will be re-evaluated at surveillance imaging given the following. Thyroid: Negative Lymph nodes: Adenopathy at the thoracic inlet and lower bilateral jugular chain on both sides with low-density centers, attributed to metastatic disease. A left-sided node on 3:97 measures 18 mm. Vascular: Atheromatous wall thickening in the neck. No acute vascular finding. Limited intracranial: Remote left inferior cerebellar infarction. Visualized orbits: Negative Mastoids and visualized paranasal sinuses: Retention cysts along the lower maxillary sinuses. Skeleton: Diffuse idiopathic skeletal hyperostosis with bulky spondylitic spurring throughout the cervical spine encroaching on the prevertebral space. Upper chest: Esophageal mass with thoracic adenopathy and pulmonary nodules. IMPRESSION: 1. Bulky esophageal malignancy at the thoracic inlet with lower cervical and thoracic adenopathy and pulmonary nodules, reference dedicated chest CT. 2. 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 Electronically Signed   By: JJorje GuildM.D.   On: 07/29/2021 18:37    Microbiology: Results for orders placed or performed during the hospital encounter of 08/07/21  Surgical PCR screen     Status: Abnormal   Collection Time: 08/10/21  7:44 AM   Specimen: Nasal Mucosa; Nasal Swab  Result Value Ref Range Status   MRSA, PCR NEGATIVE NEGATIVE Final   Staphylococcus aureus POSITIVE (A) NEGATIVE Final    Comment: (NOTE) The Xpert SA Assay (FDA approved for NASAL specimens in patients 276years of age and older), is one component of a comprehensive surveillance  program. It is not intended to diagnose infection nor to guide or monitor treatment. [facility] [address], [place] [address]    Labs: CBC: Recent Labs  Lab 08/10/21 0416 08/11/21 0757 08/12/21 0501 08/16/21 0439  WBC 7.5 8.7 7.6 6.9  NEUTROABS  --   --   --  4.5  HGB 9.6* 10.0* 9.7* 8.9*  HCT 30.5* 31.3* 30.3* 27.8*  MCV 88.4 88.9 88.1 88.3  PLT 353 371 366 3604  Basic Metabolic Panel: Recent Labs  Lab 08/10/21 0416 08/11/21 0757 08/11/21 1632 08/12/21 0501 08/12/21 1715 08/13/21 0445 08/13/21 1644 08/14/21 0415 08/16/21 0439  NA 138 139  --  139  --   --   --   --  136  K 3.6 3.8  --  3.4*  --   --   --   --  3.9  CL 107 104  --  104  --   --   --   --  102  CO2 24 25  --  26  --   --   --   --  27  GLUCOSE 85 97  --  144*  --   --   --   --  140*  BUN 12 14  --  14  --   --   --   --  11  CREATININE 0.98 1.03  --  0.96  --   --   --   --  0.78  CALCIUM 9.0 9.1  --  8.9  --   --   --   --  8.7*  MG  --  2.0   < > 2.0 1.8 1.7 2.0 1.8  --   PHOS  --   --    < > 2.4* 4.1 3.3 3.1 3.4  --    < > = values in this interval not displayed.   Liver Function Tests: Recent Labs  Lab 08/16/21 0439  AST 11*  ALT 14  ALKPHOS 58  BILITOT 0.4  PROT 6.4*  ALBUMIN 2.7*   CBG: Recent Labs  Lab 08/16/21 0005 08/16/21 0425 08/16/21 0734 08/16/21 1150 08/16/21 1615  GLUCAP 126* 142* 126* 121* 106*    Discharge time spent: greater than 30 minutes.  Signed: Elih Mooney, DO [facility] 08/16/2021

## 2021-08-16 NOTE — Assessment & Plan Note (Signed)
Deferred to PCP

## 2021-08-16 NOTE — Progress Notes (Signed)
Reviewed written d/c instructions w pt and daughter [name] and all questions answered. They both verbalized understanding. Gave pt a 3 days supply of Osmolite and Prosource, reviewed instructions re home equipment delivery and HHRN to set up tube feedings. D/C via w/c w all belongings in stable condition.

## 2021-08-16 NOTE — TOC Transition Note (Addendum)
Transition of Care [facility]) - CM/SW Discharge Note   Patient Details  Name: Leslie Livingston MRN: [id number] Date of Birth: 12/07/1950  Transition of Care [facility]) CM/SW Contact:  Dessa Phi, RN Phone Number: 08/16/2021, 2:37 PM   Clinical Narrative:Confirmed [facility] for HHRN;[employer] dme for osmolite formula, TF pump to be delivered to patient's home today-confirmed address-rep Jasmin/Zach aware of osmolite TF order. Nsg aware to provide few days of formula to patient @ d/c.Spoke to dtr LaTonya to confirm delivery of TF pump to home today. Family will transport home. No further CM needs.   -Per [employer] rep Zach need complete osmolite/prosource orders with rate-MD notified;also must have few days of formula prior d/c.Await complete orders, & formula prior d/c. -3:29p-Nsg/MD aware of orders being complete. Will need 3 bags of formula(provided by hospital-nutrition or pharmacy) to rm for safe d/c. [employer] will deliver pump to home today-they will order formula that will get into home until a few days. No further CM needs.      Barriers to Discharge: No Barriers Identified   Patient Goals and CMS Choice Patient states their goals for this hospitalization and ongoing recovery are:: Return home CMS Medicare.gov Compare Post Acute Care list provided to:: Patient Choice offered to / list presented to : Patient  Discharge Placement                       Discharge Plan and Services In-house Referral: Clinical Social Work   Post Acute Care Choice: Durable Medical Equipment, Home Health          DME Arranged: Tube feeding DME Agency: [employer] Date DME Agency Contacted: 08/11/21 Time DME Agency Contacted: [phone] Representative spoke with at DME Agency: [place]: RN [place] Agency: [place] Date [place]: 08/11/21 Time [place]: [place] Representative spoke with at [place]: Cindie  Social Determinants of Health ([place])  Interventions     Readmission Risk Interventions     No data to display

## 2021-08-16 NOTE — Assessment & Plan Note (Signed)
The patient is normotensive on amlodipine 5 mg daily.

## 2021-08-16 NOTE — Assessment & Plan Note (Signed)
Protonix per tube.

## 2021-08-16 NOTE — Assessment & Plan Note (Signed)
Noted  

## 2021-08-16 NOTE — Assessment & Plan Note (Signed)
Continue tube feeds with protein supplements.

## 2021-08-16 NOTE — Assessment & Plan Note (Signed)
Tube feeds only.

## 2021-08-16 NOTE — Progress Notes (Signed)
RD on call paged to assist w final d/c preparations.

## 2021-08-17 ENCOUNTER — Encounter: Payer: Self-pay | Admitting: *Deleted

## 2021-08-17 ENCOUNTER — Other Ambulatory Visit (HOSPITAL_COMMUNITY): Payer: Self-pay

## 2021-08-17 ENCOUNTER — Ambulatory Visit: Payer: Medicare PPO

## 2021-08-17 ENCOUNTER — Inpatient Hospital Stay: Payer: Medicare PPO | Admitting: Nurse Practitioner

## 2021-08-17 NOTE — Progress Notes (Signed)
Faxed notification from [facility] that specimen was received on 08/15/2021.

## 2021-08-18 ENCOUNTER — Encounter: Payer: Self-pay | Admitting: *Deleted

## 2021-08-18 ENCOUNTER — Other Ambulatory Visit (HOSPITAL_COMMUNITY): Payer: Self-pay

## 2021-08-18 ENCOUNTER — Ambulatory Visit: Payer: Medicare PPO

## 2021-08-18 ENCOUNTER — Other Ambulatory Visit: Payer: Self-pay

## 2021-08-18 ENCOUNTER — Ambulatory Visit
Admission: RE | Admit: 2021-08-18 | Discharge: 2021-08-18 | Disposition: A | Discharge status: Home / Self Care | Payer: Medicare PPO | Source: Ambulatory Visit | Attending: Radiation Oncology | Admitting: Radiation Oncology

## 2021-08-18 DIAGNOSIS — C153 Malignant neoplasm of upper third of esophagus: Secondary | ICD-10-CM | POA: Insufficient documentation

## 2021-08-18 DIAGNOSIS — Z510 Encounter for antineoplastic radiation therapy: Secondary | ICD-10-CM | POA: Diagnosis present

## 2021-08-18 LAB — RAD ONC ARIA SESSION SUMMARY
Course Elapsed Days: 5
Plan Fractions Treated to Date: 3
Plan Prescribed Dose Per Fraction: 2.5 Gy
Plan Total Fractions Prescribed: 14
Plan Total Prescribed Dose: 35 Gy
Reference Point Dosage Given to Date: 7.5 Gy
Reference Point Session Dosage Given: 2.5 Gy
Session Number: 3

## 2021-08-18 NOTE — Progress Notes (Signed)
Per Dr. Benay Spice: Needs 30 minute hospital follow up week of 08/24/21. Scheduling message sent.

## 2021-08-19 ENCOUNTER — Other Ambulatory Visit: Payer: Self-pay

## 2021-08-19 ENCOUNTER — Ambulatory Visit
Admission: RE | Admit: 2021-08-19 | Discharge: 2021-08-19 | Disposition: A | Discharge status: Home / Self Care | Payer: Medicare PPO | Source: Ambulatory Visit | Attending: Radiation Oncology | Admitting: Radiation Oncology

## 2021-08-19 DIAGNOSIS — Z510 Encounter for antineoplastic radiation therapy: Secondary | ICD-10-CM | POA: Diagnosis not present

## 2021-08-19 LAB — RAD ONC ARIA SESSION SUMMARY
Course Elapsed Days: 6
Plan Fractions Treated to Date: 4
Plan Prescribed Dose Per Fraction: 2.5 Gy
Plan Total Fractions Prescribed: 14
Plan Total Prescribed Dose: 35 Gy
Reference Point Dosage Given to Date: 10 Gy
Reference Point Session Dosage Given: 2.5 Gy
Session Number: 4

## 2021-08-20 ENCOUNTER — Ambulatory Visit
Admission: RE | Admit: 2021-08-20 | Discharge: 2021-08-20 | Disposition: A | Discharge status: Home / Self Care | Payer: Medicare PPO | Source: Ambulatory Visit | Attending: Radiation Oncology | Admitting: Radiation Oncology

## 2021-08-20 ENCOUNTER — Other Ambulatory Visit: Payer: Self-pay

## 2021-08-20 ENCOUNTER — Inpatient Hospital Stay: Payer: Medicare PPO | Admitting: Dietician

## 2021-08-20 DIAGNOSIS — Z510 Encounter for antineoplastic radiation therapy: Secondary | ICD-10-CM | POA: Diagnosis not present

## 2021-08-20 LAB — RAD ONC ARIA SESSION SUMMARY
Course Elapsed Days: 7
Plan Fractions Treated to Date: 5
Plan Prescribed Dose Per Fraction: 2.5 Gy
Plan Total Fractions Prescribed: 14
Plan Total Prescribed Dose: 35 Gy
Reference Point Dosage Given to Date: 12.5 Gy
Reference Point Session Dosage Given: 2.5 Gy
Session Number: 5

## 2021-08-21 ENCOUNTER — Ambulatory Visit
Admission: RE | Admit: 2021-08-21 | Discharge: 2021-08-21 | Disposition: A | Discharge status: Home / Self Care | Payer: Medicare PPO | Source: Ambulatory Visit | Attending: Radiation Oncology | Admitting: Radiation Oncology

## 2021-08-21 ENCOUNTER — Emergency Department (HOSPITAL_COMMUNITY): Payer: Medicare PPO

## 2021-08-21 ENCOUNTER — Other Ambulatory Visit: Payer: Self-pay

## 2021-08-21 ENCOUNTER — Encounter: Payer: Medicare PPO | Admitting: Speech Pathology

## 2021-08-21 ENCOUNTER — Encounter (HOSPITAL_COMMUNITY): Payer: Self-pay | Admitting: Emergency Medicine

## 2021-08-21 ENCOUNTER — Emergency Department (HOSPITAL_COMMUNITY)
Admission: EM | Admit: 2021-08-21 | Discharge: 2021-08-22 | Disposition: A | Discharge status: Home / Self Care | Payer: Medicare PPO | Attending: Emergency Medicine | Admitting: Emergency Medicine

## 2021-08-21 DIAGNOSIS — R1084 Generalized abdominal pain: Secondary | ICD-10-CM | POA: Diagnosis present

## 2021-08-21 DIAGNOSIS — R059 Cough, unspecified: Secondary | ICD-10-CM | POA: Insufficient documentation

## 2021-08-21 LAB — CBC WITH DIFFERENTIAL/PLATELET
Abs Immature Granulocytes: 0.03 10*3/uL (ref 0.00–0.07)
Basophils Absolute: 0 10*3/uL (ref 0.0–0.1)
Basophils Relative: 0 %
Eosinophils Absolute: 0.1 10*3/uL (ref 0.0–0.5)
Eosinophils Relative: 1 %
HCT: 30.1 % — ABNORMAL LOW (ref 39.0–52.0)
Hemoglobin: 9.4 g/dL — ABNORMAL LOW (ref 13.0–17.0)
Immature Granulocytes: 0 %
Lymphocytes Relative: 11 %
Lymphs Abs: 0.9 10*3/uL (ref 0.7–4.0)
MCH: 27.8 pg (ref 26.0–34.0)
MCHC: 31.2 g/dL (ref 30.0–36.0)
MCV: 89.1 fL (ref 80.0–100.0)
Monocytes Absolute: 0.8 10*3/uL (ref 0.1–1.0)
Monocytes Relative: 10 %
Neutro Abs: 6 10*3/uL (ref 1.7–7.7)
Neutrophils Relative %: 78 %
Platelets: 438 10*3/uL — ABNORMAL HIGH (ref 150–400)
RBC: 3.38 MIL/uL — ABNORMAL LOW (ref 4.22–5.81)
RDW: 13.1 % (ref 11.5–15.5)
WBC: 7.8 10*3/uL (ref 4.0–10.5)
nRBC: 0 % (ref 0.0–0.2)

## 2021-08-21 LAB — COMPREHENSIVE METABOLIC PANEL
ALT: 21 U/L (ref 0–44)
AST: 14 U/L — ABNORMAL LOW (ref 15–41)
Albumin: 3.3 g/dL — ABNORMAL LOW (ref 3.5–5.0)
Alkaline Phosphatase: 80 U/L (ref 38–126)
Anion gap: 10 (ref 5–15)
BUN: 24 mg/dL — ABNORMAL HIGH (ref 8–23)
CO2: 25 mmol/L (ref 22–32)
Calcium: 9.4 mg/dL (ref 8.9–10.3)
Chloride: 102 mmol/L (ref 98–111)
Creatinine, Ser: 0.94 mg/dL (ref 0.61–1.24)
GFR, Estimated: >60 mL/min (ref >60)
Glucose, Bld: 102 mg/dL — ABNORMAL HIGH (ref 70–99)
Potassium: 4 mmol/L (ref 3.5–5.1)
Sodium: 137 mmol/L (ref 135–145)
Total Bilirubin: 0.7 mg/dL (ref 0.3–1.2)
Total Protein: 8.1 g/dL (ref 6.5–8.1)

## 2021-08-21 LAB — RAD ONC ARIA SESSION SUMMARY
Course Elapsed Days: 8
Plan Fractions Treated to Date: 6
Plan Prescribed Dose Per Fraction: 2.5 Gy
Plan Total Fractions Prescribed: 14
Plan Total Prescribed Dose: 35 Gy
Reference Point Dosage Given to Date: 15 Gy
Reference Point Session Dosage Given: 2.5 Gy
Session Number: 6

## 2021-08-21 LAB — I-STAT CHEM 8, ED
BUN: 21 mg/dL (ref 8–23)
Calcium, Ion: 1.21 mmol/L (ref 1.15–1.40)
Chloride: 101 mmol/L (ref 98–111)
Creatinine, Ser: 1.2 mg/dL (ref 0.61–1.24)
Glucose, Bld: 104 mg/dL — ABNORMAL HIGH (ref 70–99)
HCT: 30 % — ABNORMAL LOW (ref 39.0–52.0)
Hemoglobin: 10.2 g/dL — ABNORMAL LOW (ref 13.0–17.0)
Potassium: 4.1 mmol/L (ref 3.5–5.1)
Sodium: 137 mmol/L (ref 135–145)
TCO2: 23 mmol/L (ref 22–32)

## 2021-08-21 MED ORDER — ONDANSETRON HCL 4 MG/2ML IJ SOLN
4.0000 mg | Freq: Once | INTRAMUSCULAR | Status: DC
  Filled 2021-08-21: qty 2

## 2021-08-21 MED ORDER — IOHEXOL 300 MG/ML  SOLN
50.0000 mL | Freq: Once | INTRAMUSCULAR | Status: AC | PRN
Start: 2021-08-21 — End: 2021-08-21
  Administered 2021-08-21: 50 mL

## 2021-08-21 MED ORDER — ONDANSETRON HCL 4 MG/2ML IJ SOLN
4.0000 mg | Freq: Once | INTRAMUSCULAR | Status: AC
  Administered 2021-08-21: 4 mg via INTRAVENOUS

## 2021-08-21 MED ORDER — MORPHINE SULFATE (PF) 4 MG/ML IV SOLN
4.0000 mg | Freq: Once | INTRAVENOUS | Status: DC
  Filled 2021-08-21: qty 1

## 2021-08-21 MED ORDER — IOHEXOL 300 MG/ML  SOLN
100.0000 mL | Freq: Once | INTRAMUSCULAR | Status: AC | PRN
  Administered 2021-08-21: 100 mL via INTRAVENOUS

## 2021-08-21 MED ORDER — SODIUM CHLORIDE 0.9 % IV BOLUS
1000.0000 mL | Freq: Once | INTRAVENOUS | Status: AC
  Administered 2021-08-21: 1000 mL via INTRAVENOUS

## 2021-08-21 NOTE — Discharge Instructions (Signed)
Your CT scan did not show a significant abnormality.  Your tube looks like its in good position.  Follow up with your family doc.

## 2021-08-21 NOTE — ED Triage Notes (Signed)
Pt reports that his feeding tube keeps dislodging. Denies pain. Denies N/V/D. States that he hasn't had a normal BM in at least a week. Site looks good. He advises that he is tired of talking about it.

## 2021-08-21 NOTE — ED Provider Notes (Signed)
[facility] DEPT Provider Note   CSN: [id number] Arrival date & time: 08/21/21  1857     History  Chief Complaint  Patient presents with   Feeding Tube Dislodged    Leslie Livingston is a 71 y.o. male.  71 yo M with a chief complaints of difficulty with his G-tube.  Tells me that he has been having pain in his abdomen for at least 4 weeks or so.  Tells me that his J-tube does not seem like it is functioning.  We will put fluid in there and states that tends to come out.  He has had continued coughing which is unchanged.  No fevers.        Home Medications Prior to Admission medications   Medication Sig Start Date End Date Taking? Authorizing Provider  amLODipine (NORVASC) 5 MG tablet Take 1 tablet (5 mg total) by mouth daily. 08/17/21   Swayze, Ava, DO  Morphine Sulfate (MORPHINE CONCENTRATE) 10 MG/0.5ML SOLN concentrated solution Place 0.25 - 0.5 mLs (5-10 mg total) under the tongue every 4 hours as needed for severe pain. 08/16/21   Swayze, Ava, DO  Nutritional Supplements (FEEDING SUPPLEMENT, OSMOLITE 1.5 CAL,) LIQD Place 1,000 mLs into feeding tube continuous. Rate is 55 cc/hr. 08/16/21   Swayze, Ava, DO  Nutritional Supplements (FEEDING SUPPLEMENT, OSMOLITE 1.5 CAL,) LIQD Place 1,000 mLs into feeding tube continuous. Rate: 55 cc/hr. 08/16/21   Swayze, Ava, DO  Nutritional Supplements (FEEDING SUPPLEMENT, PROSOURCE TF,) liquid Place 45 mLs into feeding tube 3 (three) times daily. Rate is 55 cc /hr. 08/16/21   Swayze, Ava, DO  pantoprazole sodium (PROTONIX) 40 mg Place 1 packet (40 mg) into feeding tube daily. 08/16/21   Swayze, Ava, DO  Water For Irrigation, Sterile (FREE WATER) SOLN Place 150 mLs into feeding tube every 4 (four) hours. Rate is 55 cc/ hr. 08/16/21   Swayze, Ava, DO      Allergies    Patient has no known allergies.    Review of Systems   Review of Systems  Physical Exam Updated Vital Signs BP 128/67   Pulse 79   Temp 99.8 F  (37.7 C) (Oral)   Resp 17   Ht '6\' 1"'$  (1.854 m)   Wt 99.8 kg   SpO2 97%   BMI 29.03 kg/m  Physical Exam Vitals and nursing note reviewed.  Constitutional:      Appearance: He is well-developed.  HENT:     Head: Normocephalic and atraumatic.  Eyes:     Pupils: Pupils are equal, round, and reactive to light.  Neck:     Vascular: No JVD.  Cardiovascular:     Rate and Rhythm: Normal rate and regular rhythm.     Heart sounds: No murmur heard.    No friction rub. No gallop.  Pulmonary:     Effort: No respiratory distress.     Breath sounds: No wheezing.  Abdominal:     General: There is no distension.     Tenderness: There is no abdominal tenderness. There is no guarding or rebound.     Comments: G-tube in place without obvious discharge without any erythema or fluctuance.  Mild diffuse abdominal discomfort.  Musculoskeletal:        General: Normal range of motion.     Cervical back: Normal range of motion and neck supple.  Skin:    Coloration: Skin is not pale.     Findings: No rash.  Neurological:     Mental Status:  He is alert and oriented to person, place, and time.  Psychiatric:        Behavior: Behavior normal.     ED Results / Procedures / Treatments   Labs (all labs ordered are listed, but only abnormal results are displayed) Labs Reviewed  COMPREHENSIVE METABOLIC PANEL - Abnormal; Notable for the following components:      Result Value   Glucose, Bld 102 (*)    BUN 24 (*)    Albumin 3.3 (*)    AST 14 (*)    All other components within normal limits  CBC WITH DIFFERENTIAL/PLATELET - Abnormal; Notable for the following components:   RBC 3.38 (*)    Hemoglobin 9.4 (*)    HCT 30.1 (*)    Platelets 438 (*)    All other components within normal limits  I-STAT CHEM 8, ED - Abnormal; Notable for the following components:   Glucose, Bld 104 (*)    Hemoglobin 10.2 (*)    HCT 30.0 (*)    All other components within normal limits    EKG None  Radiology CT  ABDOMEN PELVIS W CONTRAST  Result Date: 08/21/2021 CLINICAL DATA:  Acute abdominal pain, feeding tube EXAM: CT ABDOMEN AND PELVIS WITH CONTRAST TECHNIQUE: Multidetector CT imaging of the abdomen and pelvis was performed using the standard protocol following bolus administration of intravenous contrast. RADIATION DOSE REDUCTION: This exam was performed according to the departmental dose-optimization program which includes automated exposure control, adjustment of the mA and/or kV according to patient size and/or use of iterative reconstruction technique. CONTRAST:  111m OMNIPAQUE IOHEXOL 300 MG/ML  SOLN COMPARISON:  CT chest dated 08/14/2021. CT chest abdomen pelvis dated 08/07/2021. FINDINGS: Lower chest: Bilateral pulmonary nodules, measuring up to 2.6 cm in the medial left lower lobe (series 4/image 23), compatible with pulmonary metastases, unchanged. Pleural effusions have resolved. Hepatobiliary: Liver is within normal limits, noting focal fat/altered perfusion along the falciform ligament. Gallbladder is unremarkable. No intrahepatic or extrahepatic ductal dilatation. Pancreas: Within normal limits. Spleen: Within normal limits. Adrenals/Urinary Tract: Adrenal glands are within normal limits. 14 mm left upper pole renal cyst (series 2/image 33), benign (Bosniak I). No follow-up is recommended. Right kidney is within normal limits. No hydronephrosis. Bladder is within normal limits. Stomach/Bowel: Stomach is notable for a percutaneous gastrostomy in satisfactory position. No evidence of bowel obstruction. Normal appendix (series 2/image 58). Extensive left colonic diverticulosis, without evidence of diverticulitis. Vascular/Lymphatic: No evidence of abdominal aortic aneurysm. Atherosclerotic calcifications of the abdominal aorta and branch vessels. Small upper abdominal lymph nodes, including a 9 mm short axis gastrohepatic node (series 2/image 27), unchanged. Reproductive: Prostate is unremarkable. Other:  No abdominopelvic ascites. Postsurgical changes along the midline anterior abdominal wall. Musculoskeletal: Degenerative changes of the visualized thoracolumbar spine. IMPRESSION: Percutaneous gastrostomy in satisfactory position. Bilateral pulmonary metastases, unchanged. Pleural effusions have resolved. Small upper abdominal lymph nodes, unchanged. Electronically Signed   By: SJulian HyM.D.   On: 08/21/2021 22:28   DG ABDOMEN PEG TUBE LOCATION  Result Date: 08/21/2021 CLINICAL DATA:  Check gastrostomy catheter placement EXAM: ABDOMEN - 1 VIEW COMPARISON:  None Available. FINDINGS: Scattered large and small bowel gas is noted. Fecal material is noted throughout colon without obstructive change. Contrast injected via an indwelling gastrostomy catheter flows directly into the stomach and subsequently into the small bowel consistent with appropriate placement. IMPRESSION: Gastrostomy catheter within the stomach. Changes of mild constipation without obstruction. Electronically Signed   By: MInez CatalinaM.D.   On: 08/21/2021  20:56    Procedures Procedures    Medications Ordered in ED Medications  ondansetron (ZOFRAN) injection 4 mg (has no administration in time range)  morphine (PF) 4 MG/ML injection 4 mg (4 mg Intravenous Patient Refused/Not Given 08/21/21 2206)  iohexol (OMNIPAQUE) 300 MG/ML solution 50 mL (50 mLs Per Tube Contrast Given 08/21/21 2052)  sodium chloride 0.9 % bolus 1,000 mL (1,000 mLs Intravenous New Bag/Given 08/21/21 2150)  ondansetron (ZOFRAN) injection 4 mg (4 mg Intravenous Given 08/21/21 2150)  iohexol (OMNIPAQUE) 300 MG/ML solution 100 mL (100 mLs Intravenous Contrast Given 08/21/21 2212)    ED Course/ Medical Decision Making/ A&P                           Medical Decision Making Amount and/or Complexity of Data Reviewed Radiology: ordered.  Risk Prescription drug management.   72 yo M with a chief complaints of abdominal pain.  This has been going on for some  time.  He also feels like his G-tube is not working.  Tells me that stuff goes into it and then immediately comes back out.  He denies any fevers.  Mildly diffuse abdominal exam.  We will obtain a CT scan.  Blood work.  Reassess.  LFT and lipase unremarkable.  No leukocytosis.   CT without obstruction, tube in good position.   10:36 PM:  I have discussed the diagnosis/risks/treatment options with the patient.  Evaluation and diagnostic testing in the emergency department does not suggest an emergent condition requiring admission or immediate intervention beyond what has been performed at this time.  They will follow up with  PCP. We also discussed returning to the ED immediately if new or worsening sx occur. We discussed the sx which are most concerning (e.g., sudden worsening pain, fever, inability to tolerate by mouth) that necessitate immediate return. Medications administered to the patient during their visit and any new prescriptions provided to the patient are listed below.  Medications given during this visit Medications  ondansetron (ZOFRAN) injection 4 mg (has no administration in time range)  morphine (PF) 4 MG/ML injection 4 mg (4 mg Intravenous Patient Refused/Not Given 08/21/21 2206)  iohexol (OMNIPAQUE) 300 MG/ML solution 50 mL (50 mLs Per Tube Contrast Given 08/21/21 2052)  sodium chloride 0.9 % bolus 1,000 mL (1,000 mLs Intravenous New Bag/Given 08/21/21 2150)  ondansetron (ZOFRAN) injection 4 mg (4 mg Intravenous Given 08/21/21 2150)  iohexol (OMNIPAQUE) 300 MG/ML solution 100 mL (100 mLs Intravenous Contrast Given 08/21/21 2212)     The patient appears reasonably screen and/or stabilized for discharge and I doubt any other medical condition or other [facility] requiring further screening, evaluation, or treatment in the ED at this time prior to discharge.          Final Clinical Impression(s) / ED Diagnoses Final diagnoses:  Generalized abdominal pain    Rx / DC Orders ED  Discharge Orders     None         Deno Etienne, DO 08/21/21 2236

## 2021-08-21 NOTE — ED Provider Triage Note (Signed)
Emergency Medicine Provider Triage Evaluation Note  Leslie Livingston , a 72 y.o. male  was evaluated in triage.  Pt complains of feeding tube misplaced.  Patient states that he has been able to use his feeding tube since Monday or Tuesday of this week.  He has not had anything to eat through his tube since then.  Denies abdominal pain, nausea, fever, vomiting, diarrhea, urinary symptoms, chest pain, shortness of breath..  Review of Systems  Positive: See above Negative:   Physical Exam  BP 96/84 (BP Location: Left Arm)   Pulse 92   Temp 99.8 F (37.7 C) (Oral)   Resp 16   Ht '6\' 1"'$  (1.854 m)   Wt 99.8 kg   SpO2 95%   BMI 29.03 kg/m  Gen:   Awake, no distress   Resp:  Normal effort  MSK:   Moves extremities without difficulty  Other:  No overlying skin abnormalities of PEG tube placement or overlying surgical wound.  Patient has no tenderness to palpation of abdomen.  Lungs are clear to auscultation.  Medical Decision Making  Medically screening exam initiated at 7:59 PM.  Appropriate orders placed.  Alwyn Pea was informed that the remainder of the evaluation will be completed by another provider, this initial triage assessment does not replace that evaluation, and the importance of remaining in the ED until their evaluation is complete.     Wilnette Kales, [place] 08/21/21 2000

## 2021-08-22 NOTE — ED Notes (Signed)
Pt discharge delayed. Pt needs a ride

## 2021-08-22 NOTE — ED Notes (Signed)
Taxi called for pt.

## 2021-08-24 ENCOUNTER — Other Ambulatory Visit: Payer: Self-pay

## 2021-08-24 ENCOUNTER — Ambulatory Visit
Admission: RE | Admit: 2021-08-24 | Discharge: 2021-08-24 | Disposition: A | Discharge status: Home / Self Care | Payer: Medicare PPO | Source: Ambulatory Visit | Attending: Radiation Oncology | Admitting: Radiation Oncology

## 2021-08-24 DIAGNOSIS — Z510 Encounter for antineoplastic radiation therapy: Secondary | ICD-10-CM | POA: Diagnosis not present

## 2021-08-24 LAB — RAD ONC ARIA SESSION SUMMARY
Course Elapsed Days: 11
Plan Fractions Treated to Date: 7
Plan Prescribed Dose Per Fraction: 2.5 Gy
Plan Total Fractions Prescribed: 14
Plan Total Prescribed Dose: 35 Gy
Reference Point Dosage Given to Date: 17.5 Gy
Reference Point Session Dosage Given: 2.5 Gy
Session Number: 7

## 2021-08-25 ENCOUNTER — Other Ambulatory Visit: Payer: Self-pay | Admitting: *Deleted

## 2021-08-25 ENCOUNTER — Encounter (HOSPITAL_COMMUNITY): Payer: Self-pay | Admitting: Oncology

## 2021-08-25 ENCOUNTER — Inpatient Hospital Stay: Payer: Medicare PPO | Attending: Oncology | Admitting: Oncology

## 2021-08-25 ENCOUNTER — Encounter: Payer: Self-pay | Admitting: *Deleted

## 2021-08-25 ENCOUNTER — Ambulatory Visit: Admission: RE | Admit: 2021-08-25 | Payer: Medicare PPO | Source: Ambulatory Visit

## 2021-08-25 ENCOUNTER — Encounter: Payer: Self-pay | Admitting: Oncology

## 2021-08-25 ENCOUNTER — Telehealth: Payer: Self-pay | Admitting: Oncology

## 2021-08-25 VITALS — BP 102/85 | HR 86 | Temp 98.2°F | Resp 18 | Ht 73.0 in | Wt 197.0 lb

## 2021-08-25 DIAGNOSIS — C153 Malignant neoplasm of upper third of esophagus: Secondary | ICD-10-CM | POA: Insufficient documentation

## 2021-08-25 DIAGNOSIS — Z510 Encounter for antineoplastic radiation therapy: Secondary | ICD-10-CM | POA: Diagnosis present

## 2021-08-25 MED ORDER — PROCHLORPERAZINE MALEATE 10 MG PO TABS: 10.0000 mg | ORAL_TABLET | Freq: Four times a day (QID) | ORAL | 0 refills | Status: DC | PRN

## 2021-08-25 MED ORDER — LIDOCAINE-PRILOCAINE 2.5-2.5 % EX CREA: 1.0000 | TOPICAL_CREAM | CUTANEOUS | 0 refills | Status: DC | PRN

## 2021-08-25 MED ORDER — ONDANSETRON HCL 8 MG PO TABS: 8.0000 mg | ORAL_TABLET | Freq: Three times a day (TID) | ORAL | 0 refills | Status: DC | PRN

## 2021-08-25 NOTE — Progress Notes (Signed)
START OFF PATHWAY REGIMEN - Gastroesophageal   [id number]:mFOLFOX6 q14 Days + Nivolumab 240 mg IV D1 q14 Days:   A cycle is every 14 days:     Nivolumab      Oxaliplatin      Leucovorin      Fluorouracil      Fluorouracil   **Always confirm dose/schedule in your pharmacy ordering system**  Patient Characteristics: Distant Metastases (cM1/pM1) / Locally Recurrent Disease, Squamous Cell, Esophageal & GE Junction, First Line, PD?L1 Expression  PositiveCPS ? 1, No Prior Taxane Therapeutic Status: Distant Metastases (No Additional Staging) Histology: Squamous Cell Disease Classification: Esophageal Line of Therapy: First Line PD-L1 Expression Status: PD-L1 Expression Positive CPS ? 1 Taxane Status: No Prior Taxane Intent of Therapy: Non-Curative / Palliative Intent, Discussed with Patient

## 2021-08-25 NOTE — Progress Notes (Signed)
PATIENT NAVIGATOR PROGRESS NOTE  Name: BLAND RUDZINSKI Date: 08/25/2021 MRN: [id number]  DOB: 1951-01-22   Reason for visit:  Visit with Dr Benay Spice  Comments:  Met with Mr Banton and his daughter [name] during visit with Dr Benay Spice Provided treatment education, See education tab Escribed anti nausea meds and EMLA cream Referrals sent for nutrition and SW Provided contact information to [facility] contact information to call with any questions    Time spent counseling/coordinating care: > 60 minutes

## 2021-08-25 NOTE — Progress Notes (Signed)
[place] OFFICE PROGRESS NOTE   Diagnosis: Esophagus cancer  INTERVAL HISTORY:   Leslie Livingston returns as scheduled.  He was discharged from the hospital 08/16/2021.  He continues daily radiation to the esophagus.  Swallowing has partially improved.  He no longer has a "sore throat ".  He complains of malaise.  He is here today with his daughter.  He is using bolus tube feedings and tube feeding hydration. He was seen in the emergency room 08/21/2021 when he felt the gastrostomy tube became dislodged..  A CT found the gastrostomy tube to be in good position.  Objective:  Vital signs in last 24 hours:  Blood pressure 102/85, pulse 86, temperature 98.2 F (36.8 C), temperature source Oral, resp. rate 18, height '6\' 1"'  (1.854 m), weight 197 lb (89.4 kg), SpO2 99 %.    HEENT: Thick white coat over the tongue, no buccal thrush Lymphatics: No cervical or supraclavicular nodes Resp: Lungs clear bilaterally Cardio: Regular rate and rhythm GI: No hepatomegaly, left upper quadrant gastrostomy tube site without evidence of infection Vascular: No leg edema  Portacath/PICC-without erythema  Lab Results:  Lab Results  Component Value Date   WBC 7.8 08/21/2021   HGB 10.2 (L) 08/21/2021   HCT 30.0 (L) 08/21/2021   MCV 89.1 08/21/2021   PLT 438 (H) 08/21/2021   NEUTROABS 6.0 08/21/2021    CMP  Lab Results  Component Value Date   NA 137 08/21/2021   K 4.1 08/21/2021   CL 101 08/21/2021   CO2 25 08/21/2021   GLUCOSE 104 (H) 08/21/2021   BUN 21 08/21/2021   CREATININE 1.20 08/21/2021   CALCIUM 9.4 08/21/2021   PROT 8.1 08/21/2021   ALBUMIN 3.3 (L) 08/21/2021   AST 14 (L) 08/21/2021   ALT 21 08/21/2021   ALKPHOS 80 08/21/2021   BILITOT 0.7 08/21/2021   GFRNONAA >60 08/21/2021   GFRAA >60 05/06/2018    No results found for: "CEA1", "CEA", "OAC166", "CA125"  Lab Results  Component Value Date   INR 1.0 05/02/2018   LABPROT 13.2 05/02/2018    Imaging:  CT  ABDOMEN PELVIS W CONTRAST  Result Date: 08/21/2021 CLINICAL DATA:  Acute abdominal pain, feeding tube EXAM: CT ABDOMEN AND PELVIS WITH CONTRAST TECHNIQUE: Multidetector CT imaging of the abdomen and pelvis was performed using the standard protocol following bolus administration of intravenous contrast. RADIATION DOSE REDUCTION: This exam was performed according to the departmental dose-optimization program which includes automated exposure control, adjustment of the mA and/or kV according to patient size and/or use of iterative reconstruction technique. CONTRAST:  155m OMNIPAQUE IOHEXOL 300 MG/ML  SOLN COMPARISON:  CT chest dated 08/14/2021. CT chest abdomen pelvis dated 08/07/2021. FINDINGS: Lower chest: Bilateral pulmonary nodules, measuring up to 2.6 cm in the medial left lower lobe (series 4/image 23), compatible with pulmonary metastases, unchanged. Pleural effusions have resolved. Hepatobiliary: Liver is within normal limits, noting focal fat/altered perfusion along the falciform ligament. Gallbladder is unremarkable. No intrahepatic or extrahepatic ductal dilatation. Pancreas: Within normal limits. Spleen: Within normal limits. Adrenals/Urinary Tract: Adrenal glands are within normal limits. 14 mm left upper pole renal cyst (series 2/image 33), benign (Bosniak I). No follow-up is recommended. Right kidney is within normal limits. No hydronephrosis. Bladder is within normal limits. Stomach/Bowel: Stomach is notable for a percutaneous gastrostomy in satisfactory position. No evidence of bowel obstruction. Normal appendix (series 2/image 58). Extensive left colonic diverticulosis, without evidence of diverticulitis. Vascular/Lymphatic: No evidence of abdominal aortic aneurysm. Atherosclerotic calcifications of the  abdominal aorta and branch vessels. Small upper abdominal lymph nodes, including a 9 mm short axis gastrohepatic node (series 2/image 27), unchanged. Reproductive: Prostate is unremarkable. Other:  No abdominopelvic ascites. Postsurgical changes along the midline anterior abdominal wall. Musculoskeletal: Degenerative changes of the visualized thoracolumbar spine. IMPRESSION: Percutaneous gastrostomy in satisfactory position. Bilateral pulmonary metastases, unchanged. Pleural effusions have resolved. Small upper abdominal lymph nodes, unchanged. Electronically Signed   By: Julian Hy M.D.   On: 08/21/2021 22:28   DG ABDOMEN PEG TUBE LOCATION  Result Date: 08/21/2021 CLINICAL DATA:  Check gastrostomy catheter placement EXAM: ABDOMEN - 1 VIEW COMPARISON:  None Available. FINDINGS: Scattered large and small bowel gas is noted. Fecal material is noted throughout colon without obstructive change. Contrast injected via an indwelling gastrostomy catheter flows directly into the stomach and subsequently into the small bowel consistent with appropriate placement. IMPRESSION: Gastrostomy catheter within the stomach. Changes of mild constipation without obstruction. Electronically Signed   By: Inez Catalina M.D.   On: 08/21/2021 20:56    Medications: I have reviewed the patient's current medications.   Assessment/Plan: Metastatic esophageal cancer CT neck 07/29/2021-adenopathy at the thoracic inlet and lower bilateral jugular chains, esophagus mass, left vocal cord paralysis CT chest 07/29/2021-diffuse esophageal wall thickening, most pronounced in the proximal esophagus, multiple pulmonary nodules, mediastinal and supraclavicular lymphadenopathy Upper endoscopy 08/07/2021-mass/stricture extending from the UES to 30 cm CTs chest, abdomen, and pelvis 08/07/2021-long segment wall thickening of the esophagus, prominent proximally, multiple large supraclavicular mediastinal nodes, bilateral lung nodules,, and upper abdominal nodes similar to remote abdominal CT Biopsy 08/07/2021 showed invasive moderately differentiated squamous cell carcinoma Foundation 1-MSS, tumor mutation burden 7, PD-L1 CPS 10 Esophagus  radiation 08/13/2021 - 09/03/2021   Solid/liquid dysphagia secondary to #1 -Gastrostomy tube placement 07/31/2021 Weight loss secondary to #1 Odynophagia secondary to #1 History of colon polyps-tubular adenomas History of a diverticular bleed-April 2020 Anemia Thrombocytosis Left subclavian Port-A-Cath placement 08/10/2021 Left upper extremity edema -Doppler ultrasound of left upper extremity 08/13/2021-negative for DVT   Disposition: Leslie Livingston has been 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  He is completing a course of palliative radiation to the esophagus.  Dysphagia is partially improved.  He complains of malaise, likely secondary to radiation, the metastatic tumor burden, and malnutrition.  He will continue gastrostomy tube feedings and duration.  He understands no therapy will be curative.  I discussed the prognosis and treatment options with Leslie Livingston and his wife.  I recommend treatment with systemic therapy to begin at the completion of radiation.  I recommend FOLFOX/nivolumab.  We reviewed potential toxicities associated with the FOLFOX regimen including the chance of nausea/vomiting, mucositis, diarrhea, alopecia, infection, bleeding, and hematologic toxicity.  We discussed the sun sensitivity, rash, hyperpigmentation, and hand/foot syndrome associated with 5-fluorouracil.  We discussed the allergic reaction and various types of neuropathy seen with oxaliplatin.  We reviewed the rash, diarrhea, and various autoimmune toxicities associated with nivolumab.  He agrees to proceed.  He will attend a chemotherapy teaching class today.  Leslie Livingston will be referred for a staging PET scan.  He will return for an office visit and cycle 1 FOLFOX/nivolumab on 09/08/2021.  A chemotherapy plan was entered today.  Leslie Coder, MD  08/25/2021  9:47 AM

## 2021-08-25 NOTE — Telephone Encounter (Signed)
Attempted to contact patient in regards to scheduling nutrition appt no answer so voicemail was left

## 2021-08-26 ENCOUNTER — Other Ambulatory Visit: Payer: Self-pay

## 2021-08-26 ENCOUNTER — Telehealth: Payer: Self-pay

## 2021-08-26 ENCOUNTER — Ambulatory Visit
Admission: RE | Admit: 2021-08-26 | Discharge: 2021-08-26 | Disposition: A | Discharge status: Home / Self Care | Payer: Medicare PPO | Source: Ambulatory Visit | Attending: Radiation Oncology | Admitting: Radiation Oncology

## 2021-08-26 DIAGNOSIS — Z510 Encounter for antineoplastic radiation therapy: Secondary | ICD-10-CM | POA: Diagnosis not present

## 2021-08-26 LAB — RAD ONC ARIA SESSION SUMMARY
Course Elapsed Days: 13
Plan Fractions Treated to Date: 8
Plan Prescribed Dose Per Fraction: 2.5 Gy
Plan Total Fractions Prescribed: 14
Plan Total Prescribed Dose: 35 Gy
Reference Point Dosage Given to Date: 20 Gy
Reference Point Session Dosage Given: 2.5 Gy
Session Number: 8

## 2021-08-26 MED ORDER — FLUCONAZOLE 10 MG/ML PO SUSR: ORAL | 0 refills | Status: DC

## 2021-08-26 MED ORDER — PROMETHAZINE HCL 6.25 MG/5ML PO SYRP: ORAL_SOLUTION | ORAL | 0 refills | Status: DC

## 2021-08-26 MED ORDER — LIDOCAINE-PRILOCAINE 2.5-2.5 % EX CREA: 1.0000 | TOPICAL_CREAM | CUTANEOUS | 0 refills | Status: DC | PRN

## 2021-08-26 NOTE — Telephone Encounter (Signed)
Marshal from [place] called to verify the dose of the patient phenergan. Per Dr. Benay Spice take 12.5 mg via G-tube every 6 hours as needed for nausea and vomiting.

## 2021-08-27 ENCOUNTER — Inpatient Hospital Stay: Payer: Medicare PPO | Admitting: Nurse Practitioner

## 2021-08-27 ENCOUNTER — Telehealth: Payer: Self-pay | Admitting: *Deleted

## 2021-08-27 ENCOUNTER — Other Ambulatory Visit: Payer: Self-pay

## 2021-08-27 ENCOUNTER — Inpatient Hospital Stay: Payer: Medicare PPO

## 2021-08-27 ENCOUNTER — Ambulatory Visit
Admission: RE | Admit: 2021-08-27 | Discharge: 2021-08-27 | Disposition: A | Discharge status: Home / Self Care | Payer: Medicare PPO | Source: Ambulatory Visit | Attending: Radiation Oncology | Admitting: Radiation Oncology

## 2021-08-27 DIAGNOSIS — Z510 Encounter for antineoplastic radiation therapy: Secondary | ICD-10-CM | POA: Diagnosis not present

## 2021-08-27 LAB — RAD ONC ARIA SESSION SUMMARY
Course Elapsed Days: 14
Plan Fractions Treated to Date: 9
Plan Prescribed Dose Per Fraction: 2.5 Gy
Plan Total Fractions Prescribed: 14
Plan Total Prescribed Dose: 35 Gy
Reference Point Dosage Given to Date: 22.5 Gy
Reference Point Session Dosage Given: 2.5 Gy
Session Number: 9

## 2021-08-27 NOTE — Telephone Encounter (Signed)
Unsuccessful reaching patient with PET scan information for 09/03/21 at 0800/0830 at [facility]. Sent message to Dr. Johny Shears nurse requesting she inform patient at next tx appointment on 08/28/21. Will continue to try to reach him.

## 2021-08-27 NOTE — Progress Notes (Signed)
[place] Work  Clinical Social Work was referred by Art therapist for assessment of psychosocial needs.  Clinical Social Worker contacted patient by phone  to offer support and assess for needs.    He is currently living with his daughter who helps care for him.  She works and was not present during the phone conversation.  Discussed available resources and he was agreeable to apply for disability.  Application paperwork was mailed to him.  Also referred him for the [employer].  Patient reports receiving support from his family.  He spends most of the day watching tv.  He is able to obtain food and has transportation.  He expressed no other needs at this time.  CSW to follow up.   [name], LCSW  Clinical Social Worker [facility]

## 2021-08-28 ENCOUNTER — Ambulatory Visit
Admission: RE | Admit: 2021-08-28 | Discharge: 2021-08-28 | Disposition: A | Discharge status: Home / Self Care | Payer: Medicare PPO | Source: Ambulatory Visit | Attending: Radiation Oncology | Admitting: Radiation Oncology

## 2021-08-28 ENCOUNTER — Inpatient Hospital Stay: Payer: Medicare PPO | Admitting: Dietician

## 2021-08-28 ENCOUNTER — Inpatient Hospital Stay: Payer: Medicare PPO

## 2021-08-28 ENCOUNTER — Other Ambulatory Visit: Payer: Self-pay

## 2021-08-28 DIAGNOSIS — Z510 Encounter for antineoplastic radiation therapy: Secondary | ICD-10-CM | POA: Diagnosis not present

## 2021-08-28 LAB — RAD ONC ARIA SESSION SUMMARY
Course Elapsed Days: 15
Plan Fractions Treated to Date: 10
Plan Prescribed Dose Per Fraction: 2.5 Gy
Plan Total Fractions Prescribed: 14
Plan Total Prescribed Dose: 35 Gy
Reference Point Dosage Given to Date: 25 Gy
Reference Point Session Dosage Given: 2.5 Gy
Session Number: 10

## 2021-08-28 NOTE — Progress Notes (Signed)
Patient did not show for nutrition appointment. 

## 2021-08-28 NOTE — Progress Notes (Signed)
[place] CSW Progress Note  Holiday representative met with patient to discuss resources.  His daughter, [name], was also present.  She expressed patient having difficulty paying for medications.  CSW also enrolled patient in [place] to assist with food and gas related needs.  CSW provided two gift cards.  CSW completed referral and sent securely to [place] provided education regarding the [place] [facility] [place] also and she agreed to referral.    Rodman Pickle Janiah Devinney, LCSW

## 2021-08-31 ENCOUNTER — Other Ambulatory Visit (HOSPITAL_COMMUNITY): Payer: Self-pay

## 2021-08-31 ENCOUNTER — Other Ambulatory Visit: Payer: Self-pay

## 2021-08-31 ENCOUNTER — Ambulatory Visit
Admission: RE | Admit: 2021-08-31 | Discharge: 2021-08-31 | Disposition: A | Discharge status: Home / Self Care | Payer: Medicare PPO | Source: Ambulatory Visit | Attending: Radiation Oncology | Admitting: Radiation Oncology

## 2021-08-31 DIAGNOSIS — Z510 Encounter for antineoplastic radiation therapy: Secondary | ICD-10-CM | POA: Diagnosis not present

## 2021-08-31 LAB — RAD ONC ARIA SESSION SUMMARY
Course Elapsed Days: 18
Plan Fractions Treated to Date: 11
Plan Prescribed Dose Per Fraction: 2.5 Gy
Plan Total Fractions Prescribed: 14
Plan Total Prescribed Dose: 35 Gy
Reference Point Dosage Given to Date: 27.5 Gy
Reference Point Session Dosage Given: 2.5 Gy
Session Number: 11

## 2021-08-31 NOTE — Progress Notes (Signed)
Pharmacist Chemotherapy Monitoring - Initial Assessment    Anticipated start date: 09/08/21   The following has been reviewed per standard work regarding the patient's treatment regimen: The patient's diagnosis, treatment plan and drug doses, and organ/hematologic function Lab orders and baseline tests specific to treatment regimen  The treatment plan start date, drug sequencing, and pre-medications Prior authorization status  Patient's documented medication list, including drug-drug interaction screen and prescriptions for anti-emetics and supportive care specific to the treatment regimen The drug concentrations, fluid compatibility, administration routes, and timing of the medications to be used The patient's access for treatment and lifetime cumulative dose history, if applicable  The patient's medication allergies and previous infusion related reactions, if applicable   Changes made to treatment plan:  N/A  Follow up needed:  Pending authorization for treatment    Kennith Center, Pharm.D., CPP 08/31/2021'@1'$ :09 PM

## 2021-09-01 ENCOUNTER — Ambulatory Visit
Admission: RE | Admit: 2021-09-01 | Discharge: 2021-09-01 | Disposition: A | Discharge status: Home / Self Care | Payer: Medicare PPO | Source: Ambulatory Visit | Attending: Radiation Oncology | Admitting: Radiation Oncology

## 2021-09-01 ENCOUNTER — Ambulatory Visit: Payer: Medicare PPO

## 2021-09-01 ENCOUNTER — Other Ambulatory Visit: Payer: Self-pay

## 2021-09-01 DIAGNOSIS — Z510 Encounter for antineoplastic radiation therapy: Secondary | ICD-10-CM | POA: Diagnosis not present

## 2021-09-01 LAB — RAD ONC ARIA SESSION SUMMARY
Course Elapsed Days: 19
Plan Fractions Treated to Date: 12
Plan Prescribed Dose Per Fraction: 2.5 Gy
Plan Total Fractions Prescribed: 14
Plan Total Prescribed Dose: 35 Gy
Reference Point Dosage Given to Date: 30 Gy
Reference Point Session Dosage Given: 2.5 Gy
Session Number: 12

## 2021-09-02 ENCOUNTER — Ambulatory Visit: Payer: Medicare PPO

## 2021-09-02 ENCOUNTER — Other Ambulatory Visit: Payer: Self-pay

## 2021-09-02 ENCOUNTER — Ambulatory Visit
Admission: RE | Admit: 2021-09-02 | Discharge: 2021-09-02 | Disposition: A | Discharge status: Home / Self Care | Payer: Medicare PPO | Source: Ambulatory Visit | Attending: Radiation Oncology | Admitting: Radiation Oncology

## 2021-09-02 DIAGNOSIS — Z510 Encounter for antineoplastic radiation therapy: Secondary | ICD-10-CM | POA: Diagnosis not present

## 2021-09-02 LAB — RAD ONC ARIA SESSION SUMMARY
Course Elapsed Days: 20
Plan Fractions Treated to Date: 13
Plan Prescribed Dose Per Fraction: 2.5 Gy
Plan Total Fractions Prescribed: 14
Plan Total Prescribed Dose: 35 Gy
Reference Point Dosage Given to Date: 32.5 Gy
Reference Point Session Dosage Given: 2.5 Gy
Session Number: 13

## 2021-09-03 ENCOUNTER — Ambulatory Visit
Admission: RE | Admit: 2021-09-03 | Discharge: 2021-09-03 | Disposition: A | Discharge status: Home / Self Care | Payer: Medicare PPO | Source: Ambulatory Visit | Attending: Radiation Oncology | Admitting: Radiation Oncology

## 2021-09-03 ENCOUNTER — Encounter: Payer: Self-pay | Admitting: Radiation Oncology

## 2021-09-03 ENCOUNTER — Other Ambulatory Visit: Payer: Self-pay

## 2021-09-03 ENCOUNTER — Ambulatory Visit: Payer: Medicare PPO

## 2021-09-03 ENCOUNTER — Encounter (HOSPITAL_COMMUNITY): Admission: RE | Admit: 2021-09-03 | Payer: Medicare PPO | Source: Ambulatory Visit

## 2021-09-03 DIAGNOSIS — Z510 Encounter for antineoplastic radiation therapy: Secondary | ICD-10-CM | POA: Diagnosis not present

## 2021-09-03 LAB — RAD ONC ARIA SESSION SUMMARY
Course Elapsed Days: 21
Plan Fractions Treated to Date: 14
Plan Prescribed Dose Per Fraction: 2.5 Gy
Plan Total Fractions Prescribed: 14
Plan Total Prescribed Dose: 35 Gy
Reference Point Dosage Given to Date: 35 Gy
Reference Point Session Dosage Given: 2.5 Gy
Session Number: 14

## 2021-09-05 ENCOUNTER — Other Ambulatory Visit: Payer: Self-pay | Admitting: Oncology

## 2021-09-05 DIAGNOSIS — C159 Malignant neoplasm of esophagus, unspecified: Secondary | ICD-10-CM

## 2021-09-05 NOTE — Progress Notes (Signed)
OFF PATHWAY REGIMEN - Gastroesophageal  No Change  Continue With Treatment as Ordered.  Original Decision Date/Time: 08/25/2021 17:19   [id number]:mFOLFOX6 q14 Days + Nivolumab 240 mg IV D1 q14 Days:   A cycle is every 14 days:     Nivolumab      Oxaliplatin      Leucovorin      Fluorouracil      Fluorouracil   **Always confirm dose/schedule in your pharmacy ordering system**  Patient Characteristics: Distant Metastases (cM1/pM1) / Locally Recurrent Disease, Squamous Cell, Esophageal & GE Junction, First Line, PD?L1 Expression  PositiveCPS ? 1, No Prior Taxane Therapeutic Status: Distant Metastases (No Additional Staging) Histology: Squamous Cell Disease Classification: Esophageal Line of Therapy: First Line PD-L1 Expression Status: PD-L1 Expression Positive CPS ? 1 Taxane Status: No Prior Taxane Intent of Therapy: Non-Curative / Palliative Intent, Discussed with Patient

## 2021-09-07 ENCOUNTER — Other Ambulatory Visit: Payer: Self-pay

## 2021-09-08 ENCOUNTER — Inpatient Hospital Stay: Payer: Medicare PPO

## 2021-09-08 ENCOUNTER — Inpatient Hospital Stay: Payer: Medicare PPO | Admitting: Nurse Practitioner

## 2021-09-08 ENCOUNTER — Encounter: Payer: Self-pay | Admitting: *Deleted

## 2021-09-08 ENCOUNTER — Encounter: Payer: Self-pay | Admitting: Nurse Practitioner

## 2021-09-08 ENCOUNTER — Telehealth: Payer: Self-pay

## 2021-09-08 VITALS — BP 132/71 | HR 85 | Resp 20

## 2021-09-08 VITALS — BP 108/78 | HR 100 | Temp 98.1°F | Resp 18 | Ht 73.0 in | Wt 196.8 lb

## 2021-09-08 DIAGNOSIS — C7951 Secondary malignant neoplasm of bone: Secondary | ICD-10-CM | POA: Diagnosis not present

## 2021-09-08 DIAGNOSIS — Z923 Personal history of irradiation: Secondary | ICD-10-CM | POA: Insufficient documentation

## 2021-09-08 DIAGNOSIS — Z86010 Personal history of colonic polyps: Secondary | ICD-10-CM | POA: Diagnosis not present

## 2021-09-08 DIAGNOSIS — T451X5A Adverse effect of antineoplastic and immunosuppressive drugs, initial encounter: Secondary | ICD-10-CM | POA: Diagnosis not present

## 2021-09-08 DIAGNOSIS — Z5111 Encounter for antineoplastic chemotherapy: Secondary | ICD-10-CM | POA: Insufficient documentation

## 2021-09-08 DIAGNOSIS — Z931 Gastrostomy status: Secondary | ICD-10-CM | POA: Insufficient documentation

## 2021-09-08 DIAGNOSIS — Z79899 Other long term (current) drug therapy: Secondary | ICD-10-CM | POA: Insufficient documentation

## 2021-09-08 DIAGNOSIS — D701 Agranulocytosis secondary to cancer chemotherapy: Secondary | ICD-10-CM | POA: Diagnosis not present

## 2021-09-08 DIAGNOSIS — D75839 Thrombocytosis, unspecified: Secondary | ICD-10-CM | POA: Insufficient documentation

## 2021-09-08 DIAGNOSIS — R634 Abnormal weight loss: Secondary | ICD-10-CM | POA: Insufficient documentation

## 2021-09-08 DIAGNOSIS — C159 Malignant neoplasm of esophagus, unspecified: Secondary | ICD-10-CM | POA: Diagnosis not present

## 2021-09-08 DIAGNOSIS — R5381 Other malaise: Secondary | ICD-10-CM | POA: Diagnosis not present

## 2021-09-08 DIAGNOSIS — D649 Anemia, unspecified: Secondary | ICD-10-CM | POA: Diagnosis not present

## 2021-09-08 LAB — CMP (CANCER CENTER ONLY)
ALT: 46 U/L — ABNORMAL HIGH (ref 0–44)
AST: 22 U/L (ref 15–41)
Albumin: 3.8 g/dL (ref 3.5–5.0)
Alkaline Phosphatase: 121 U/L (ref 38–126)
Anion gap: 10 (ref 5–15)
BUN: 38 mg/dL — ABNORMAL HIGH (ref 8–23)
CO2: 29 mmol/L (ref 22–32)
Calcium: 10.3 mg/dL (ref 8.9–10.3)
Chloride: 102 mmol/L (ref 98–111)
Creatinine: 1.04 mg/dL (ref 0.61–1.24)
GFR, Estimated: >60 mL/min (ref >60)
Glucose, Bld: 139 mg/dL — ABNORMAL HIGH (ref 70–99)
Potassium: 4.7 mmol/L (ref 3.5–5.1)
Sodium: 141 mmol/L (ref 135–145)
Total Bilirubin: 0.4 mg/dL (ref 0.3–1.2)
Total Protein: 8.8 g/dL — ABNORMAL HIGH (ref 6.5–8.1)

## 2021-09-08 LAB — CBC WITH DIFFERENTIAL (CANCER CENTER ONLY)
Abs Immature Granulocytes: 0.03 10*3/uL (ref 0.00–0.07)
Basophils Absolute: 0 10*3/uL (ref 0.0–0.1)
Basophils Relative: 0 %
Eosinophils Absolute: 0.1 10*3/uL (ref 0.0–0.5)
Eosinophils Relative: 2 %
HCT: 33.2 % — ABNORMAL LOW (ref 39.0–52.0)
Hemoglobin: 10.3 g/dL — ABNORMAL LOW (ref 13.0–17.0)
Immature Granulocytes: 0 %
Lymphocytes Relative: 9 %
Lymphs Abs: 0.8 10*3/uL (ref 0.7–4.0)
MCH: 27.3 pg (ref 26.0–34.0)
MCHC: 31 g/dL (ref 30.0–36.0)
MCV: 88.1 fL (ref 80.0–100.0)
Monocytes Absolute: 0.8 10*3/uL (ref 0.1–1.0)
Monocytes Relative: 9 %
Neutro Abs: 6.7 10*3/uL (ref 1.7–7.7)
Neutrophils Relative %: 80 %
Platelet Count: 443 10*3/uL — ABNORMAL HIGH (ref 150–400)
RBC: 3.77 MIL/uL — ABNORMAL LOW (ref 4.22–5.81)
RDW: 14.4 % (ref 11.5–15.5)
WBC Count: 8.5 10*3/uL (ref 4.0–10.5)
nRBC: 0 % (ref 0.0–0.2)

## 2021-09-08 LAB — TSH: TSH: 0.441 u[IU]/mL (ref 0.350–4.500)

## 2021-09-08 MED ORDER — FLUOROURACIL CHEMO INJECTION 2.5 GM/50ML
400.0000 mg/m2 | Freq: Once | INTRAVENOUS | Status: AC
  Administered 2021-09-08: 850 mg via INTRAVENOUS
  Filled 2021-09-08: qty 17

## 2021-09-08 MED ORDER — SODIUM CHLORIDE 0.9 % IV SOLN
10.0000 mg | Freq: Once | INTRAVENOUS | Status: AC
  Administered 2021-09-08: 10 mg via INTRAVENOUS
  Filled 2021-09-08: qty 1

## 2021-09-08 MED ORDER — DEXTROSE 5 % IV SOLN: Freq: Once | INTRAVENOUS | Status: AC

## 2021-09-08 MED ORDER — PALONOSETRON HCL INJECTION 0.25 MG/5ML
0.2500 mg | Freq: Once | INTRAVENOUS | Status: AC
  Administered 2021-09-08: 0.25 mg via INTRAVENOUS

## 2021-09-08 MED ORDER — OXALIPLATIN CHEMO INJECTION 100 MG/20ML
85.0000 mg/m2 | Freq: Once | INTRAVENOUS | Status: AC
  Administered 2021-09-08: 185 mg via INTRAVENOUS
  Filled 2021-09-08: qty 37

## 2021-09-08 MED ORDER — SODIUM CHLORIDE 0.9 % IV SOLN
240.0000 mg | Freq: Once | INTRAVENOUS | Status: AC
  Administered 2021-09-08: 240 mg via INTRAVENOUS
  Filled 2021-09-08: qty 24

## 2021-09-08 MED ORDER — LEUCOVORIN CALCIUM INJECTION 350 MG
400.0000 mg/m2 | Freq: Once | INTRAVENOUS | Status: AC
  Administered 2021-09-08: 860 mg via INTRAVENOUS
  Filled 2021-09-08: qty 43

## 2021-09-08 MED ORDER — SODIUM CHLORIDE 0.9 % IV SOLN
2000.0000 mg/m2 | INTRAVENOUS | Status: DC
  Administered 2021-09-08: 4300 mg via INTRAVENOUS
  Filled 2021-09-08: qty 86

## 2021-09-08 NOTE — Progress Notes (Signed)
Patient seen by Lisa Thomas NP today  Vitals are within treatment parameters.  Labs reviewed by Lisa Thomas NP and are within treatment parameters.  Per physician team, patient is ready for treatment and there are NO modifications to the treatment plan.     

## 2021-09-08 NOTE — Progress Notes (Signed)
  Leslie Livingston OFFICE PROGRESS NOTE   Diagnosis: Esophagus cancer  INTERVAL HISTORY:   Leslie Livingston returns as scheduled.  He is seen today prior to proceeding with cycle 1 FOLFOX/nivolumab.  He continues to have difficulty managing secretions.  No significant intake by mouth.  Nutritional source remains via G-tube.  Objective:  Vital signs in last 24 hours:  Blood pressure 108/78, pulse 100, temperature 98.1 F (36.7 C), temperature source Oral, resp. rate 18, height 6' 1" (1.854 m), weight 196 lb 12.8 oz (89.3 kg), SpO2 99 %.    HEENT: White coating over tongue. Resp: Lungs clear bilaterally. Cardio: Right the rate and rhythm. GI: No hepatomegaly.  Left upper quadrant gastrostomy tube site without evidence of infection.  Midline staples intact. Vascular: No leg edema. Port-A-Cath without erythema.  Lab Results:  Lab Results  Component Value Date   WBC 8.5 09/08/2021   HGB 10.3 (L) 09/08/2021   HCT 33.2 (L) 09/08/2021   MCV 88.1 09/08/2021   PLT 443 (H) 09/08/2021   NEUTROABS 6.7 09/08/2021    Imaging:  No results found.  Medications: I have reviewed the patient's current medications.  Assessment/Plan: Metastatic esophageal cancer CT neck 07/29/2021-adenopathy at the thoracic inlet and lower bilateral jugular chains, esophagus mass, left vocal cord paralysis CT chest 07/29/2021-diffuse esophageal wall thickening, most pronounced in the proximal esophagus, multiple pulmonary nodules, mediastinal and supraclavicular lymphadenopathy Upper endoscopy 08/07/2021-mass/stricture extending from the UES to 30 cm CTs chest, abdomen, and pelvis 08/07/2021-long segment wall thickening of the esophagus, prominent proximally, multiple large supraclavicular mediastinal nodes, bilateral lung nodules,, and upper abdominal nodes similar to remote abdominal CT Biopsy 08/07/2021 showed invasive moderately differentiated squamous cell carcinoma Foundation 1-MSS, tumor mutation  burden 7, PD-L1 CPS 10 Esophagus radiation 08/13/2021 - 09/03/2021 Cycle 1 FOLFOX/nivolumab 09/08/2021   Solid/liquid dysphagia secondary to #1 -Gastrostomy tube placement 07/31/2021 Weight loss secondary to #1 Odynophagia secondary to #1 History of colon polyps-tubular adenomas History of a diverticular bleed-April 2020 Anemia Thrombocytosis Left subclavian Port-A-Cath placement 08/10/2021 Left upper extremity edema -Doppler ultrasound of left upper extremity 08/13/2021-negative for DVT    Disposition: Leslie Livingston appears stable.  He is scheduled to begin systemic therapy today with FOLFOX/nivolumab.  We again reviewed potential toxicities.  He agrees to proceed.  CBC and chemistry panel reviewed.  Labs adequate to proceed as above.  He will return for lab, follow-up, cycle 2 FOLFOX/nivolumab in 2 weeks.  He will contact the office in the interim with any problems.  Patient seen with Dr. Benay Spice.    Ned Card ANP/GNP-BC   09/08/2021  9:53 AM This was a shared visit with Ned Card.  Leslie Livingston was interviewed and examined.  He will begin FOLFOX/nivolumab today.  We will refer him to the surgical clinic for staple removal.  We reviewed the treatment plan with his daughter.  I was present for greater than 50% of today's visit.  I performed medical decision making.  Julieanne Manson, MD

## 2021-09-08 NOTE — Telephone Encounter (Signed)
[facility] and schedule  the patient on 09/10/21 at 9. Patient received the time and address. Patient and his wife expresses understanding and had no further questions or concerns.

## 2021-09-08 NOTE — Patient Instructions (Signed)
[facility]   Discharge Instructions: Thank you for choosing [facility] to provide your oncology and hematology care.   If you have a lab appointment with the [facility], please go directly to the [facility] and check in at the registration area.   Wear comfortable clothing and clothing appropriate for easy access to any Portacath or PICC line.   We strive to give you quality time with your provider. You may need to reschedule your appointment if you arrive late (15 or more minutes).  Arriving late affects you and other patients whose appointments are after yours.  Also, if you miss three or more appointments without notifying the office, you may be dismissed from the clinic at the provider's discretion.      For prescription refill requests, have your pharmacy contact our office and allow 72 hours for refills to be completed.    Today you received the following chemotherapy and/or immunotherapy agents Nivolumab (OPDIVO), Oxaliplatin (ELOXATIN), Leucovorin & Flourouracil (ADRUCIL).      To help prevent nausea and vomiting after your treatment, we encourage you to take your nausea medication as directed.  BELOW ARE SYMPTOMS THAT SHOULD BE REPORTED IMMEDIATELY: *FEVER GREATER THAN 100.4 F (38 C) OR HIGHER *CHILLS OR SWEATING *NAUSEA AND VOMITING THAT IS NOT CONTROLLED WITH YOUR NAUSEA MEDICATION *UNUSUAL SHORTNESS OF BREATH *UNUSUAL BRUISING OR BLEEDING *URINARY PROBLEMS (pain or burning when urinating, or frequent urination) *BOWEL PROBLEMS (unusual diarrhea, constipation, pain near the anus) TENDERNESS IN MOUTH AND THROAT WITH OR WITHOUT PRESENCE OF ULCERS (sore throat, sores in mouth, or a toothache) UNUSUAL RASH, SWELLING OR PAIN  UNUSUAL VAGINAL DISCHARGE OR ITCHING   Items with * indicate a potential emergency and should be followed up as soon as possible or go to the Emergency Department if any problems should occur.  Please show  the CHEMOTHERAPY ALERT CARD or IMMUNOTHERAPY ALERT CARD at check-in to the Emergency Department and triage nurse.  Should you have questions after your visit or need to cancel or reschedule your appointment, please contact [facility]  Dept: [phone]  and follow the prompts.  Office hours are 8:00 a.m. to 4:30 p.m. Monday - Friday. Please note that voicemails left after 4:00 p.m. may not be returned until the following business day.  We are closed weekends and major holidays. You have access to a nurse at all times for urgent questions. Please call the main number to the clinic Dept: [phone] and follow the prompts.   For any non-urgent questions, you may also contact your provider using MyChart. We now offer e-Visits for anyone 16 and older to request care online for non-urgent symptoms. For details visit [email or web address].   Also download the MyChart app! Go to the app store, search "MyChart", open the app, select [facility], and log in with your MyChart username and password.  Masks are optional in the cancer centers. If you would like for your care team to wear a mask while they are taking care of you, please let them know. You may have one support person who is at least 72 years old accompany you for your appointments.  Nivolumab Injection What is this medication? NIVOLUMAB (nye VOL ue mab) treats some types of cancer. It works by helping your immune system slow or stop the spread of cancer cells. It is a monoclonal antibody. This medicine may be used for other purposes; ask your health care provider or pharmacist if you  have questions. COMMON BRAND NAME(S): Opdivo What should I tell my care team before I take this medication? They need to know if you have any of these conditions: Allogeneic stem cell transplant (uses someone else's stem cells) Autoimmune diseases, such as Crohn disease, ulcerative colitis, lupus History of chest radiation Nervous  system problems, such as Guillain-Barre syndrome or myasthenia gravis Organ transplant An unusual or allergic reaction to nivolumab, other medications, foods, dyes, or preservatives Pregnant or trying to get pregnant Breast-feeding How should I use this medication? This medication is infused into a vein. It is given in a hospital or clinic setting. A special MedGuide will be given to you before each treatment. Be sure to read this information carefully each time. Talk to your care team about the use of this medication in children. While it may be prescribed for children as young as 12 years for selected conditions, precautions do apply. Overdosage: If you think you have taken too much of this medicine contact a poison control center or emergency room at once. NOTE: This medicine is only for you. Do not share this medicine with others. What if I miss a dose? Keep appointments for follow-up doses. It is important not to miss your dose. Call your care team if you are unable to keep an appointment. What may interact with this medication? Interactions have not been studied. This list may not describe all possible interactions. Give your health care provider a list of all the medicines, herbs, non-prescription drugs, or dietary supplements you use. Also tell them if you smoke, drink alcohol, or use illegal drugs. Some items may interact with your medicine. What should I watch for while using this medication? Your condition will be monitored carefully while you are receiving this medication. You may need blood work while taking this medication. This medication may cause serious skin reactions. They can happen weeks to months after starting the medication. Contact your care team right away if you notice fevers or flu-like symptoms with a rash. The rash may be red or purple and then turn into blisters or peeling of the skin. You may also notice a red rash with swelling of the face, lips, or lymph nodes in  your neck or under your arms. Tell your care team right away if you have any change in your eyesight. Talk to your care team if you are pregnant or think you might be pregnant. A negative pregnancy test is required before starting this medication. A reliable form of contraception is recommended while taking this medication and for 5 months after the last dose. Talk to your care team about effective forms of contraception. Do not breast-feed while taking this medication and for 5 months after the last dose. What side effects may I notice from receiving this medication? Side effects that you should report to your care team as soon as possible: Allergic reactions--skin rash, itching, hives, swelling of the face, lips, tongue, or throat Dry cough, shortness of breath or trouble breathing Eye pain, redness, irritation, or discharge with blurry or decreased vision Heart muscle inflammation--unusual weakness or fatigue, shortness of breath, chest pain, fast or irregular heartbeat, dizziness, swelling of the ankles, feet, or hands Hormone gland problems--headache, sensitivity to light, unusual weakness or fatigue, dizziness, fast or irregular heartbeat, increased sensitivity to cold or heat, excessive sweating, constipation, hair loss, increased thirst or amount of urine, tremors or shaking, irritability Infusion reactions--chest pain, shortness of breath or trouble breathing, feeling faint or lightheaded Kidney injury (glomerulonephritis)--decrease in  the amount of urine, red or dark brown urine, foamy or bubbly urine, swelling of the ankles, hands, or feet Liver injury--right upper belly pain, loss of appetite, nausea, light-colored stool, dark yellow or brown urine, yellowing skin or eyes, unusual weakness or fatigue Pain, tingling, or numbness in the hands or feet, muscle weakness, change in vision, confusion or trouble speaking, loss of balance or coordination, trouble walking, seizures Rash, fever, and  swollen lymph nodes Redness, blistering, peeling, or loosening of the skin, including inside the mouth Sudden or severe stomach pain, bloody diarrhea, fever, nausea, vomiting Side effects that usually do not require medical attention (report these to your care team if they continue or are bothersome): Bone, joint, or muscle pain Diarrhea Fatigue Loss of appetite Nausea Skin rash This list may not describe all possible side effects. Call your doctor for medical advice about side effects. You may report side effects to FDA at 1-800-FDA-1088. Where should I keep my medication? This medication is given in a hospital or clinic. It will not be stored at home. NOTE: This sheet is a summary. It may not cover all possible information. If you have questions about this medicine, talk to your doctor, pharmacist, or health care provider.  2023 Elsevier/Gold Standard (2020-12-12 00:00:00)  Oxaliplatin Injection What is this medication? OXALIPLATIN (ox AL i PLA tin) treats some types of cancer. It works by slowing down the growth of cancer cells. This medicine may be used for other purposes; ask your health care provider or pharmacist if you have questions. COMMON BRAND NAME(S): Eloxatin What should I tell my care team before I take this medication? They need to know if you have any of these conditions: Heart disease History of irregular heartbeat or rhythm Liver disease Low blood cell levels (white cells, red cells, and platelets) Lung or breathing disease, such as asthma Take medications that treat or prevent blood clots Tingling of the fingers, toes, or other nerve disorder An unusual or allergic reaction to oxaliplatin, other medications, foods, dyes, or preservatives If you or your partner are pregnant or trying to get pregnant Breast-feeding How should I use this medication? This medication is injected into a vein. It is given by your care team in a hospital or clinic setting. Talk to your  care team about the use of this medication in children. Special care may be needed. Overdosage: If you think you have taken too much of this medicine contact a poison control center or emergency room at once. NOTE: This medicine is only for you. Do not share this medicine with others. What if I miss a dose? Keep appointments for follow-up doses. It is important not to miss a dose. Call your care team if you are unable to keep an appointment. What may interact with this medication? Do not take this medication with any of the following: Cisapride Dronedarone Pimozide Thioridazine This medication may also interact with the following: Aspirin and aspirin-like medications Certain medications that treat or prevent blood clots, such as warfarin, apixaban, dabigatran, and rivaroxaban Cisplatin Cyclosporine Diuretics Medications for infection, such as acyclovir, adefovir, amphotericin B, bacitracin, cidofovir, foscarnet, ganciclovir, gentamicin, pentamidine, vancomycin NSAIDs, medications for pain and inflammation, such as ibuprofen or naproxen Other medications that cause heart rhythm changes Pamidronate Zoledronic acid This list may not describe all possible interactions. Give your health care provider a list of all the medicines, herbs, non-prescription drugs, or dietary supplements you use. Also tell them if you smoke, drink alcohol, or use illegal drugs. Some  items may interact with your medicine. What should I watch for while using this medication? Your condition will be monitored carefully while you are receiving this medication. You may need blood work while taking this medication. This medication may make you feel generally unwell. This is not uncommon as chemotherapy can affect healthy cells as well as cancer cells. Report any side effects. Continue your course of treatment even though you feel ill unless your care team tells you to stop. This medication may increase your risk of getting  an infection. Call your care team for advice if you get a fever, chills, sore throat, or other symptoms of a cold or flu. Do not treat yourself. Try to avoid being around people who are sick. Avoid taking medications that contain aspirin, acetaminophen, ibuprofen, naproxen, or ketoprofen unless instructed by your care team. These medications may hide a fever. Be careful brushing or flossing your teeth or using a toothpick because you may get an infection or bleed more easily. If you have any dental work done, tell your dentist you are receiving this medication. This medication can make you more sensitive to cold. Do not drink cold drinks or use ice. Cover exposed skin before coming in contact with cold temperatures or cold objects. When out in cold weather wear warm clothing and cover your mouth and nose to warm the air that goes into your lungs. Tell your care team if you get sensitive to the cold. Talk to your care team if you or your partner are pregnant or think either of you might be pregnant. This medication can cause serious birth defects if taken during pregnancy and for 9 months after the last dose. A negative pregnancy test is required before starting this medication. A reliable form of contraception is recommended while taking this medication and for 9 months after the last dose. Talk to your care team about effective forms of contraception. Do not father a child while taking this medication and for 6 months after the last dose. Use a condom while having sex during this time period. Do not breastfeed while taking this medication and for 3 months after the last dose. This medication may cause infertility. Talk to your care team if you are concerned about your fertility. What side effects may I notice from receiving this medication? Side effects that you should report to your care team as soon as possible: Allergic reactions--skin rash, itching, hives, swelling of the face, lips, tongue, or  throat Bleeding--bloody or black, tar-like stools, vomiting blood or brown material that looks like coffee grounds, red or dark brown urine, small red or purple spots on skin, unusual bruising or bleeding Dry cough, shortness of breath or trouble breathing Heart rhythm changes--fast or irregular heartbeat, dizziness, feeling faint or lightheaded, chest pain, trouble breathing Infection--fever, chills, cough, sore throat, wounds that don't heal, pain or trouble when passing urine, general feeling of discomfort or being unwell Liver injury--right upper belly pain, loss of appetite, nausea, light-colored stool, dark yellow or brown urine, yellowing skin or eyes, unusual weakness or fatigue Low red blood cell level--unusual weakness or fatigue, dizziness, headache, trouble breathing Muscle injury--unusual weakness or fatigue, muscle pain, dark yellow or brown urine, decrease in amount of urine Pain, tingling, or numbness in the hands or feet Sudden and severe headache, confusion, change in vision, seizures, which may be signs of posterior reversible encephalopathy syndrome (PRES) Unusual bruising or bleeding Side effects that usually do not require medical attention (report to your care team  if they continue or are bothersome): Diarrhea Nausea Pain, redness, or swelling with sores inside the mouth or throat Unusual weakness or fatigue Vomiting This list may not describe all possible side effects. Call your doctor for medical advice about side effects. You may report side effects to FDA at 1-800-FDA-1088. Where should I keep my medication? This medication is given in a hospital or clinic. It will not be stored at home. NOTE: This sheet is a summary. It may not cover all possible information. If you have questions about this medicine, talk to your doctor, pharmacist, or health care provider.  2023 Elsevier/Gold Standard (2021-05-08 00:00:00)  Leucovorin Injection What is this  medication? LEUCOVORIN (loo koe VOR in) prevents side effects from certain medications, such as methotrexate. It works by increasing folate levels. This helps protect healthy cells in your body. It may also be used to treat anemia caused by low levels of folate. It can also be used with fluorouracil, a type of chemotherapy, to treat colorectal cancer. It works by increasing the effects of fluorouracil in the body. This medicine may be used for other purposes; ask your health care provider or pharmacist if you have questions. What should I tell my care team before I take this medication? They need to know if you have any of these conditions: Anemia from low levels of vitamin B12 in the blood An unusual or allergic reaction to leucovorin, folic acid, other medications, foods, dyes, or preservatives Pregnant or trying to get pregnant Breastfeeding How should I use this medication? This medication is injected into a vein or a muscle. It is given by your care team in a hospital or clinic setting. Talk to your care team about the use of this medication in children. Special care may be needed. Overdosage: If you think you have taken too much of this medicine contact a poison control center or emergency room at once. NOTE: This medicine is only for you. Do not share this medicine with others. What if I miss a dose? Keep appointments for follow-up doses. It is important not to miss your dose. Call your care team if you are unable to keep an appointment. What may interact with this medication? Capecitabine Fluorouracil Phenobarbital Phenytoin Primidone Trimethoprim;sulfamethoxazole This list may not describe all possible interactions. Give your health care provider a list of all the medicines, herbs, non-prescription drugs, or dietary supplements you use. Also tell them if you smoke, drink alcohol, or use illegal drugs. Some items may interact with your medicine. What should I watch for while using  this medication? Your condition will be monitored carefully while you are receiving this medication. This medication may increase the side effects of 5-fluorouracil. Tell your care team if you have diarrhea or mouth sores that do not get better or that get worse. What side effects may I notice from receiving this medication? Side effects that you should report to your care team as soon as possible: Allergic reactions--skin rash, itching, hives, swelling of the face, lips, tongue, or throat This list may not describe all possible side effects. Call your doctor for medical advice about side effects. You may report side effects to FDA at 1-800-FDA-1088. Where should I keep my medication? This medication is given in a hospital or clinic. It will not be stored at home. NOTE: This sheet is a summary. It may not cover all possible information. If you have questions about this medicine, talk to your doctor, pharmacist, or health care provider.  2023 Elsevier/Gold Standard (  2021-05-22 00:00:00)  Fluorouracil Injection What is this medication? FLUOROURACIL (flure oh YOOR a sil) treats some types of cancer. It works by slowing down the growth of cancer cells. This medicine may be used for other purposes; ask your health care provider or pharmacist if you have questions. COMMON BRAND NAME(S): Adrucil What should I tell my care team before I take this medication? They need to know if you have any of these conditions: Blood disorders Dihydropyrimidine dehydrogenase (DPD) deficiency Infection, such as chickenpox, cold sores, herpes Kidney disease Liver disease Poor nutrition Recent or ongoing radiation therapy An unusual or allergic reaction to fluorouracil, other medications, foods, dyes, or preservatives If you or your partner are pregnant or trying to get pregnant Breast-feeding How should I use this medication? This medication is injected into a vein. It is administered by your care team in a  hospital or clinic setting. Talk to your care team about the use of this medication in children. Special care may be needed. Overdosage: If you think you have taken too much of this medicine contact a poison control center or emergency room at once. NOTE: This medicine is only for you. Do not share this medicine with others. What if I miss a dose? Keep appointments for follow-up doses. It is important not to miss your dose. Call your care team if you are unable to keep an appointment. What may interact with this medication? Do not take this medication with any of the following: Live virus vaccines This medication may also interact with the following: Medications that treat or prevent blood clots, such as warfarin, enoxaparin, dalteparin This list may not describe all possible interactions. Give your health care provider a list of all the medicines, herbs, non-prescription drugs, or dietary supplements you use. Also tell them if you smoke, drink alcohol, or use illegal drugs. Some items may interact with your medicine. What should I watch for while using this medication? Your condition will be monitored carefully while you are receiving this medication. This medication may make you feel generally unwell. This is not uncommon as chemotherapy can affect healthy cells as well as cancer cells. Report any side effects. Continue your course of treatment even though you feel ill unless your care team tells you to stop. In some cases, you may be given additional medications to help with side effects. Follow all directions for their use. This medication may increase your risk of getting an infection. Call your care team for advice if you get a fever, chills, sore throat, or other symptoms of a cold or flu. Do not treat yourself. Try to avoid being around people who are sick. This medication may increase your risk to bruise or bleed. Call your care team if you notice any unusual bleeding. Be careful brushing  or flossing your teeth or using a toothpick because you may get an infection or bleed more easily. If you have any dental work done, tell your dentist you are receiving this medication. Avoid taking medications that contain aspirin, acetaminophen, ibuprofen, naproxen, or ketoprofen unless instructed by your care team. These medications may hide a fever. Do not treat diarrhea with over the counter products. Contact your care team if you have diarrhea that lasts more than 2 days or if it is severe and watery. This medication can make you more sensitive to the sun. Keep out of the sun. If you cannot avoid being in the sun, wear protective clothing and sunscreen. Do not use sun lamps, tanning beds, or  tanning booths. Talk to your care team if you or your partner wish to become pregnant or think you might be pregnant. This medication can cause serious birth defects if taken during pregnancy and for 3 months after the last dose. A reliable form of contraception is recommended while taking this medication and for 3 months after the last dose. Talk to your care team about effective forms of contraception. Do not father a child while taking this medication and for 3 months after the last dose. Use a condom while having sex during this time period. Do not breastfeed while taking this medication. This medication may cause infertility. Talk to your care team if you are concerned about your fertility. What side effects may I notice from receiving this medication? Side effects that you should report to your care team as soon as possible: Allergic reactions--skin rash, itching, hives, swelling of the face, lips, tongue, or throat Heart attack--pain or tightness in the chest, shoulders, arms, or jaw, nausea, shortness of breath, cold or clammy skin, feeling faint or lightheaded Heart failure--shortness of breath, swelling of the ankles, feet, or hands, sudden weight gain, unusual weakness or fatigue Heart rhythm  changes--fast or irregular heartbeat, dizziness, feeling faint or lightheaded, chest pain, trouble breathing High ammonia level--unusual weakness or fatigue, confusion, loss of appetite, nausea, vomiting, seizures Infection--fever, chills, cough, sore throat, wounds that don't heal, pain or trouble when passing urine, general feeling of discomfort or being unwell Low red blood cell level--unusual weakness or fatigue, dizziness, headache, trouble breathing Pain, tingling, or numbness in the hands or feet, muscle weakness, change in vision, confusion or trouble speaking, loss of balance or coordination, trouble walking, seizures Redness, swelling, and blistering of the skin over hands and feet Severe or prolonged diarrhea Unusual bruising or bleeding Side effects that usually do not require medical attention (report to your care team if they continue or are bothersome): Dry skin Headache Increased tears Nausea Pain, redness, or swelling with sores inside the mouth or throat Sensitivity to light Vomiting This list may not describe all possible side effects. Call your doctor for medical advice about side effects. You may report side effects to FDA at 1-800-FDA-1088. Where should I keep my medication? This medication is given in a hospital or clinic. It will not be stored at home. NOTE: This sheet is a summary. It may not cover all possible information. If you have questions about this medicine, talk to your doctor, pharmacist, or health care provider.  2023 Elsevier/Gold Standard (2021-05-19 00:00:00)  The chemotherapy medication bag should finish at 46 hours, 96 hours, or 7 days. For example, if your pump is scheduled for 46 hours and it was put on at 4:00 p.m., it should finish at 2:00 p.m. the day it is scheduled to come off regardless of your appointment time.     Estimated time to finish at 12:30 p.m. on Thursday 09/10/2021.   If the display on your pump reads "Low Volume" and it is  beeping, take the batteries out of the pump and come to the cancer center for it to be taken off.   If the pump alarms go off prior to the pump reading "Low Volume" then call [phone] and someone can assist you.  If the plunger comes out and the chemotherapy medication is leaking out, please use your home chemo spill kit to clean up the spill. Do NOT use paper towels or other household products.  If you have problems or questions regarding your pump,  please call either [phone] (24 hours a day) or the cancer center Monday-Friday 8:00 a.m.- 4:30 p.m. at the clinic number and we will assist you. If you are unable to get assistance, then go to the nearest Emergency Department and ask the staff to contact the IV team for assistance.

## 2021-09-09 ENCOUNTER — Telehealth: Payer: Self-pay

## 2021-09-09 LAB — T4: T4, Total: 5.5 ug/dL (ref 4.5–12.0)

## 2021-09-09 NOTE — Telephone Encounter (Signed)
24 Hour Call Back  Telephone call to patient post first time Opdivo/Folfox. Unable to reach patient and unable to leave a voice message.

## 2021-09-10 ENCOUNTER — Inpatient Hospital Stay: Payer: Medicare PPO

## 2021-09-10 VITALS — BP 95/70 | HR 97 | Temp 98.0°F | Resp 18

## 2021-09-10 DIAGNOSIS — Z5111 Encounter for antineoplastic chemotherapy: Secondary | ICD-10-CM | POA: Diagnosis not present

## 2021-09-10 DIAGNOSIS — C159 Malignant neoplasm of esophagus, unspecified: Secondary | ICD-10-CM

## 2021-09-10 MED ORDER — SODIUM CHLORIDE 0.9% FLUSH
10.0000 mL | INTRAVENOUS | Status: DC | PRN
  Administered 2021-09-10: 10 mL

## 2021-09-10 MED ORDER — HEPARIN SOD (PORK) LOCK FLUSH 100 UNIT/ML IV SOLN
500.0000 [IU] | Freq: Once | INTRAVENOUS | Status: AC | PRN
  Administered 2021-09-10: 500 [IU]

## 2021-09-10 NOTE — Patient Instructions (Signed)
Heparin injection What is this medication? HEPARIN (HEP a rin) is an anticoagulant. It is used to treat or prevent clots in the veins, arteries, lungs, or heart. It stops clots from forming or getting bigger. This medicine prevents clotting during open-heart surgery, dialysis, or in patients who are confined to bed. This medicine may be used for other purposes; ask your health care provider or pharmacist if you have questions. COMMON BRAND NAME(S): Hep-Lock, Hep-Lock U/P, Hepflush-10, Monoject Prefill Advanced Heparin Lock Flush, SASH Normal Saline and Heparin What should I tell my care team before I take this medication? They need to know if you have any of these conditions: bleeding disorders, such as hemophilia or low blood platelets bowel disease or diverticulitis endocarditis high blood pressure liver disease recent surgery or delivery of a baby stomach ulcers an unusual or allergic reaction to heparin, benzyl alcohol, sulfites, other medicines, foods, dyes, or preservatives pregnant or trying to get pregnant breast-feeding How should I use this medication? This medicine is given by injection or infusion into a vein. It can also be given by injection of small amounts under the skin. It is usually given by a health care professional in a hospital or clinic setting. If you get this medicine at home, you will be taught how to prepare and give this medicine. Use exactly as directed. Take your medicine at regular intervals. Do not take it more often than directed. Do not stop taking except on your doctor's advice. Stopping this medicine may increase your risk of a blot clot. Be sure to refill your prescription before you run out of medicine. It is important that you put your used needles and syringes in a special sharps container. Do not put them in a trash can. If you do not have a sharps container, call your pharmacist or healthcare provider to get one. Talk to your pediatrician regarding the  use of this medicine in children. While this medicine may be prescribed for children for selected conditions, precautions do apply. Overdosage: If you think you have taken too much of this medicine contact a poison control center or emergency room at once. NOTE: This medicine is only for you. Do not share this medicine with others. What if I miss a dose? If you miss a dose, take it as soon as you can. If it is almost time for your next dose, take only that dose. Do not take double or extra doses. What may interact with this medication? Do not take this medicine with any of the following medications: aspirin and aspirin-like drugs mifepristone medicines that treat or prevent blood clots like warfarin, enoxaparin, and dalteparin palifermin protamine This medicine may also interact with the following medications: dextran digoxin hydroxychloroquine medicines for treating colds or allergies nicotine NSAIDs, medicines for pain and inflammation, like ibuprofen or naproxen phenylbutazone tetracycline antibiotics This list may not describe all possible interactions. Give your health care provider a list of all the medicines, herbs, non-prescription drugs, or dietary supplements you use. Also tell them if you smoke, drink alcohol, or use illegal drugs. Some items may interact with your medicine. What should I watch for while using this medication? Visit your healthcare professional for regular checks on your progress. You may need blood work done while you are taking this medicine. Your condition will be monitored carefully while you are receiving this medicine. It is important not to miss any appointments. Wear a medical ID bracelet or chain, and carry a card that describes your disease and details   of your medicine and dosage times. Notify your doctor or healthcare professional at once if you have cold, blue hands or feet. If you are going to need surgery or other procedure, tell your healthcare  professional that you are using this medicine. Avoid sports and activities that might cause injury while you are using this medicine. Severe falls or injuries can cause unseen bleeding. Be careful when using sharp tools or knives. Consider using an electric razor. Take special care brushing or flossing your teeth. Report any injuries, bruising, or red spots on the skin to your healthcare professional. Using this medicine for a long time may weaken your bones and increase the risk of bone fractures. You should make sure that you get enough calcium and vitamin D while you are taking this medicine. Discuss the foods you eat and the vitamins you take with your healthcare professional. Wear a medical ID bracelet or chain. Carry a card that describes your disease and details of your medicine and dosage times. What side effects may I notice from receiving this medication? Side effects that you should report to your doctor or health care professional as soon as possible: allergic reactions like skin rash, itching or hives, swelling of the face, lips, or tongue bone pain fever, chills nausea, vomiting signs and symptoms of bleeding such as bloody or black, tarry stools; red or dark-brown urine; spitting up blood or brown material that looks like coffee grounds; red spots on the skin; unusual bruising or bleeding from the eye, gums, or nose signs and symptoms of a blood clot such as chest pain; shortness of breath; pain, swelling, or warmth in the leg signs and symptoms of a stroke such as changes in vision; confusion; trouble speaking or understanding; severe headaches; sudden numbness or weakness of the face, arm or leg; trouble walking; dizziness; loss of coordination Side effects that usually do not require medical attention (report to your doctor or health care professional if they continue or are bothersome): hair loss pain, redness, or irritation at site where injected This list may not describe all  possible side effects. Call your doctor for medical advice about side effects. You may report side effects to FDA at 1-800-FDA-1088. Where should I keep my medication? Keep out of the reach of children. Store unopened vials at room temperature between 15 and 30 degrees C (59 and 86 degrees F). Do not freeze. Do not use if solution is discolored or particulate matter is present. Throw away any unused medicine after the expiration date. NOTE: This sheet is a summary. It may not cover all possible information. If you have questions about this medicine, talk to your doctor, pharmacist, or health care provider.  2023 Elsevier/Gold Standard (2004-10-19 00:00:00)  

## 2021-09-14 ENCOUNTER — Encounter: Payer: Self-pay | Admitting: Oncology

## 2021-09-14 ENCOUNTER — Telehealth: Payer: Self-pay

## 2021-09-14 ENCOUNTER — Telehealth: Payer: Self-pay | Admitting: Nutrition

## 2021-09-14 ENCOUNTER — Other Ambulatory Visit (HOSPITAL_COMMUNITY): Payer: Self-pay

## 2021-09-14 NOTE — Telephone Encounter (Signed)
I spoke with [place], patient's daughter, regarding tube feeding.  I will leave 1 case of Osmolite 1.5 along with tube feeding supplies at the [facility] front desk for patient's daughter to pick up at 7 PM tonight.  Daughter would like to change nutrition appointment from Wednesday to tomorrow morning at 9 AM.  She reports she is off that day.  I have talked with [facility] who confirms patient's tube feeding is supplied through their agency.  Andree Coss states refill order is being processed.  I educated patient's daughter that she must call at [facility] for refill orders for tube feeding in the future.

## 2021-09-14 NOTE — Telephone Encounter (Signed)
Pt's daughter called the infusion room wanting to know how to get more supplies for her father's feeding tube. This RN informed pt that her message would be passed to the appropriate department and that they would call her. Pt's daughter verbalizes understanding. Message sent to Cristy Friedlander, RN and Berneta Sages, RN

## 2021-09-14 NOTE — Progress Notes (Signed)
  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     Plan: The patient has completed radiation treatment. The patient will return to radiation oncology clinic for routine followup in one month. I advised him to call or return sooner if he has any questions or concerns related to his recovery or treatment. ________________________________  Sheral Apley. Tammi Klippel, M.D.

## 2021-09-15 ENCOUNTER — Telehealth: Payer: Self-pay

## 2021-09-15 ENCOUNTER — Other Ambulatory Visit: Payer: Self-pay

## 2021-09-15 ENCOUNTER — Inpatient Hospital Stay: Payer: Medicare PPO | Admitting: Dietician

## 2021-09-15 DIAGNOSIS — C153 Malignant neoplasm of upper third of esophagus: Secondary | ICD-10-CM

## 2021-09-15 DIAGNOSIS — C159 Malignant neoplasm of esophagus, unspecified: Secondary | ICD-10-CM

## 2021-09-15 NOTE — Progress Notes (Signed)
Nutrition Assessment   Reason for Assessment: Esophageal cancer   ASSESSMENT: 71 year old male with metastatic esophageal cancer. S/p Gtube 7/7. He has completed radiation under the care of Dr. Tammi Klippel (7/20-8/10). Patient currently receiving FOLFOX + Nivolumab q14d (started 8/15). He is followed by Dr. Benay Spice.   Past medical history includes HTN, external hemorrhoids, diverticulosis of colon with hemorrhage, dysphagia, acute blood loss anemia, mal of moderate degree  Met with daughter of patient in office. Patient is not present for visit. Daughter reports pt is not eating/drinking by mouth. He rinses mouth with water, but then spits it out. She says she is out of tube feeding bags and asking when to expect delivery of supplies. Daughter has been rinsing then reusing same bag for last 2 days. Daughter reports patient is on feeding pump 24 hours/day @ 26m/hr. She puts 4 and 1/2 cartons in pump before going to work at [employer] She puts an additional 4 and 1/2 cartons in after getting home [facility] Daughter reports adding 500 ml water for flushes. She is giving 45 ml Prosource TF TID with 120 ml water flush before and after via tube. She gives additional 120 ml water before and after medications via tube. Daughter reports the tip of tube is split after getting syringe stuck. She wrapped this with tape, but it leaks. There is no clamp on tube. Daughter reports previously having lopez valve, however home health nurse told her she did not need it. Daughter reports patient was tolerating bolus feedings prior to hospital discharge. She would like to try this at home and not use pump. Says pt lays in bed all day hooked to that pump and he needs to get up and move around. Daughter reports pt sister stays with him during the day and can provide feedings while she is working.    Nutrition Focused Physical Exam: unable to complete - pt not present    Medications: diflucan, morphine concentrate, zofran, protonix,  compazine, phenergan   Labs: 8/15 - glucose 139, BUN 38   Anthropometrics: Weights have decreased 12.9% (29 lbs) from usual weight in 3 months; this is severe  Height: 6'1" Weight: 196 lb 12.8 oz (8/15) UBW: 225 lb (May 2023) BMI: 25.96   Estimated Energy Needs  Kcals: 2500-2679 Protein: 116-134 Fluid: >/= 2.2 L  Osmolite 1.5 - give 6 and 1/2 cartons split over 4 feedings day. Flush tube with 120 ml before and after bolus. Give 45 ml Prosource TF with 30 ml water TID. This provides 2428 kcal, 130 grams protein, 2227 ml total water with flushes  NUTRITION DIAGNOSIS: Inadequate oral intake related to dysphagia secondary to esophageal cancer as evidenced PEG for nutrition/hydration needs   MALNUTRITION DIAGNOSIS: Suspect moderate malnutrition ongoing    INTERVENTION:  Reviewed bolus feeding with daughter on PEG teaching device. Provided lopez valve and educated on attaching/using  Assisted daughter with bolus feeding schedule that aligns with her work schedule - instructions + written schedule given to daughter Message sent to NP and RN navigator regarding split tube tip to arrange for IR evaluation if indicated  Call placed to [facility] for ETA of formula/supplies, awaiting return call at this time Additional complimentary case of Osmolite 1.5 provided today per daughter request Contact information provided as well as direct telephone number for [facility] ordering. Daughter understands to call one week prior to needing formula/supplies  MONITORING, EVALUATION, GOAL: Pt will tolerate tube feedings via bolus    Next Visit: Will f/u via telephone with  daughter in one week

## 2021-09-15 NOTE — Telephone Encounter (Signed)
Patient is schedule to have his G-tube replacement on the 09/16/21, daughter is aware of the time and place.

## 2021-09-16 ENCOUNTER — Ambulatory Visit (HOSPITAL_COMMUNITY)
Admission: RE | Admit: 2021-09-16 | Discharge: 2021-09-16 | Disposition: A | Discharge status: Home / Self Care | Payer: Medicare PPO | Source: Ambulatory Visit | Attending: Oncology | Admitting: Oncology

## 2021-09-16 ENCOUNTER — Other Ambulatory Visit: Payer: Self-pay | Admitting: Oncology

## 2021-09-16 ENCOUNTER — Inpatient Hospital Stay: Payer: Medicare PPO | Admitting: Nutrition

## 2021-09-16 DIAGNOSIS — C153 Malignant neoplasm of upper third of esophagus: Secondary | ICD-10-CM | POA: Insufficient documentation

## 2021-09-16 DIAGNOSIS — Z431 Encounter for attention to gastrostomy: Secondary | ICD-10-CM | POA: Diagnosis not present

## 2021-09-16 HISTORY — PX: IR REPLC GASTRO/COLONIC TUBE PERCUT W/FLUORO: IMG2333

## 2021-09-16 MED ORDER — IOHEXOL 300 MG/ML  SOLN
50.0000 mL | Freq: Once | INTRAMUSCULAR | Status: AC | PRN
  Administered 2021-09-16: 10 mL

## 2021-09-16 MED ORDER — LIDOCAINE VISCOUS HCL 2 % MT SOLN
OROMUCOSAL | Status: AC
  Filled 2021-09-16: qty 15

## 2021-09-18 ENCOUNTER — Telehealth: Payer: Self-pay

## 2021-09-18 ENCOUNTER — Emergency Department (HOSPITAL_COMMUNITY): Payer: Medicare PPO

## 2021-09-18 ENCOUNTER — Encounter (HOSPITAL_COMMUNITY): Payer: Self-pay | Admitting: Emergency Medicine

## 2021-09-18 ENCOUNTER — Emergency Department (HOSPITAL_COMMUNITY)
Admission: EM | Admit: 2021-09-18 | Discharge: 2021-09-18 | Disposition: A | Discharge status: Home / Self Care | Payer: Medicare PPO | Attending: Emergency Medicine | Admitting: Emergency Medicine

## 2021-09-18 ENCOUNTER — Other Ambulatory Visit: Payer: Self-pay

## 2021-09-18 DIAGNOSIS — R0602 Shortness of breath: Secondary | ICD-10-CM | POA: Diagnosis present

## 2021-09-18 DIAGNOSIS — Z8501 Personal history of malignant neoplasm of esophagus: Secondary | ICD-10-CM | POA: Diagnosis not present

## 2021-09-18 DIAGNOSIS — C7951 Secondary malignant neoplasm of bone: Secondary | ICD-10-CM

## 2021-09-18 DIAGNOSIS — I10 Essential (primary) hypertension: Secondary | ICD-10-CM | POA: Insufficient documentation

## 2021-09-18 DIAGNOSIS — Z79899 Other long term (current) drug therapy: Secondary | ICD-10-CM | POA: Insufficient documentation

## 2021-09-18 DIAGNOSIS — J984 Other disorders of lung: Secondary | ICD-10-CM

## 2021-09-18 DIAGNOSIS — Z20822 Contact with and (suspected) exposure to covid-19: Secondary | ICD-10-CM | POA: Insufficient documentation

## 2021-09-18 DIAGNOSIS — C7952 Secondary malignant neoplasm of bone marrow: Secondary | ICD-10-CM | POA: Diagnosis not present

## 2021-09-18 DIAGNOSIS — Z87891 Personal history of nicotine dependence: Secondary | ICD-10-CM | POA: Insufficient documentation

## 2021-09-18 LAB — CBC WITH DIFFERENTIAL/PLATELET
Abs Immature Granulocytes: 0.01 10*3/uL (ref 0.00–0.07)
Basophils Absolute: 0 10*3/uL (ref 0.0–0.1)
Basophils Relative: 1 %
Eosinophils Absolute: 0.1 10*3/uL (ref 0.0–0.5)
Eosinophils Relative: 2 %
HCT: 29.5 % — ABNORMAL LOW (ref 39.0–52.0)
Hemoglobin: 9.3 g/dL — ABNORMAL LOW (ref 13.0–17.0)
Immature Granulocytes: 0 %
Lymphocytes Relative: 18 %
Lymphs Abs: 0.7 10*3/uL (ref 0.7–4.0)
MCH: 27.6 pg (ref 26.0–34.0)
MCHC: 31.5 g/dL (ref 30.0–36.0)
MCV: 87.5 fL (ref 80.0–100.0)
Monocytes Absolute: 0.5 10*3/uL (ref 0.1–1.0)
Monocytes Relative: 12 %
Neutro Abs: 2.6 10*3/uL (ref 1.7–7.7)
Neutrophils Relative %: 67 %
Platelets: 317 10*3/uL (ref 150–400)
RBC: 3.37 MIL/uL — ABNORMAL LOW (ref 4.22–5.81)
RDW: 14.9 % (ref 11.5–15.5)
WBC: 3.9 10*3/uL — ABNORMAL LOW (ref 4.0–10.5)
nRBC: 0 % (ref 0.0–0.2)

## 2021-09-18 LAB — PROTIME-INR
INR: 1.2 (ref 0.8–1.2)
Prothrombin Time: 14.7 seconds (ref 11.4–15.2)

## 2021-09-18 LAB — BASIC METABOLIC PANEL
Anion gap: 12 (ref 5–15)
BUN: 40 mg/dL — ABNORMAL HIGH (ref 8–23)
CO2: 23 mmol/L (ref 22–32)
Calcium: 9.4 mg/dL (ref 8.9–10.3)
Chloride: 100 mmol/L (ref 98–111)
Creatinine, Ser: 0.93 mg/dL (ref 0.61–1.24)
GFR, Estimated: >60 mL/min (ref >60)
Glucose, Bld: 172 mg/dL — ABNORMAL HIGH (ref 70–99)
Potassium: 4 mmol/L (ref 3.5–5.1)
Sodium: 135 mmol/L (ref 135–145)

## 2021-09-18 LAB — BRAIN NATRIURETIC PEPTIDE: B Natriuretic Peptide: 54.7 pg/mL (ref 0.0–100.0)

## 2021-09-18 LAB — RESP PANEL BY RT-PCR (FLU A&B, COVID) ARPGX2
Influenza A by PCR: NEGATIVE
Influenza B by PCR: NEGATIVE
SARS Coronavirus 2 by RT PCR: NEGATIVE

## 2021-09-18 MED ORDER — MORPHINE SULFATE (PF) 4 MG/ML IV SOLN
4.0000 mg | Freq: Once | INTRAVENOUS | Status: AC
  Administered 2021-09-18: 4 mg via INTRAVENOUS
  Filled 2021-09-18: qty 1

## 2021-09-18 MED ORDER — ONDANSETRON HCL 4 MG/2ML IJ SOLN
4.0000 mg | Freq: Once | INTRAMUSCULAR | Status: AC
  Administered 2021-09-18: 4 mg via INTRAVENOUS
  Filled 2021-09-18: qty 2

## 2021-09-18 MED ORDER — IOHEXOL 350 MG/ML SOLN
80.0000 mL | Freq: Once | INTRAVENOUS | Status: AC | PRN
Start: 2021-09-18 — End: 2021-09-18
  Administered 2021-09-18: 80 mL via INTRAVENOUS

## 2021-09-18 MED ORDER — ALBUTEROL SULFATE HFA 108 (90 BASE) MCG/ACT IN AERS: 2.0000 | INHALATION_SPRAY | RESPIRATORY_TRACT | Status: DC | PRN

## 2021-09-18 NOTE — ED Provider Triage Note (Signed)
Emergency Medicine Provider Triage Evaluation Note  Leslie Livingston , a 71 y.o. male  was evaluated in triage.  Pt complains of sob. Report sob, pleuritic cp and cough x 1 week.  No fever, chills, n/v/d.  Hx of throat cancer  Review of Systems  Positive: As above Negative: As above  Physical Exam  BP 121/76   Pulse 100   Resp 20   SpO2 99%  Gen:   Awake, no distress   Resp:  Normal effort  MSK:   Moves extremities without difficulty  Other:    Medical Decision Making  Medically screening exam initiated at 1:11 PM.  Appropriate orders placed.  Alwyn Pea was informed that the remainder of the evaluation will be completed by another provider, this initial triage assessment does not replace that evaluation, and the importance of remaining in the ED until their evaluation is complete.     Domenic Moras, PA-C 09/18/21 1312

## 2021-09-18 NOTE — ED Provider Notes (Signed)
[name] DEPT Provider Note  CSN: [id number] Arrival date & time: 09/18/21 1249  Chief Complaint(s) Cough and Shortness of Breath  HPI Leslie Livingston is a 71 y.o. male with history of metastatic esophageal cancer presenting to the emergency department with cough.  Patient reports 1 day of cough, shortness of breath.  He reports chest pain with coughing.  Reports his cough is productive of white phlegm.  He also has some abdominal pain with coughing, but denies abdominal pain or chest pain otherwise.  Denies fevers or chills.  Denies nausea or vomiting.  Denies diarrhea.  Denies leg swelling, recent travel.  Symptoms are moderate.  Past Medical History Past Medical History:  Diagnosis Date  . Allergy   . Arthritis   . Hyperlipidemia   . Hypertension    Patient Active Problem List   Diagnosis Date Noted  . Malnutrition of moderate degree 08/11/2021  . Cancer of proximal third of esophagus ([place]) 08/11/2021  . Dysphagia 08/11/2021  . Squamous cell carcinoma, esophagus (HCC) 08/11/2021  . Esophageal mass 08/07/2021  . Iron deficiency anemia due to chronic blood loss 05/05/2018  . Dyslipidemia 05/05/2018  . Obesity (BMI 30-39.9) 05/05/2018  . GERD (gastroesophageal reflux disease) 05/05/2018  . Acute blood loss anemia 05/05/2018  . Diverticulosis of colon with hemorrhage   . Rectal bleed 05/04/2018  . GI bleed 05/02/2018  . ERECTILE DYSFUNCTION 06/06/2007  . ANEMIA-NOS 12/16/2006  . Essential hypertension 12/16/2006  . HEMORRHOIDS, EXTERNAL 12/16/2006   Home Medication(s) Prior to Admission medications   Medication Sig Start Date End Date Taking? Authorizing Provider  amLODipine (NORVASC) 5 MG tablet Take 1 tablet (5 mg total) by mouth daily. 08/17/21   Swayze, Ava, DO  fluconazole (DIFLUCAN) 10 MG/ML suspension Take 10 ml (100 mg total)via G tube daily for 5 days. 08/26/21   Ladell Pier, MD  lidocaine-prilocaine (EMLA) cream Apply 1  Application topically as needed. 08/26/21   Ladell Pier, MD  lisinopril (ZESTRIL) 20 MG tablet Take by mouth.    [provider]  Morphine Sulfate (MORPHINE CONCENTRATE) 10 MG/0.5ML SOLN concentrated solution Place 0.25 - 0.5 mLs (5-10 mg total) under the tongue every 4 hours as needed for severe pain. 08/16/21   Swayze, Ava, DO  Nutritional Supplements (FEEDING SUPPLEMENT, OSMOLITE 1.5 CAL,) LIQD Place 1,000 mLs into feeding tube continuous. Rate is 55 cc/hr. 08/16/21   Swayze, Ava, DO  Nutritional Supplements (FEEDING SUPPLEMENT, OSMOLITE 1.5 CAL,) LIQD Place 1,000 mLs into feeding tube continuous. Rate: 55 cc/hr. 08/16/21   Swayze, Ava, DO  Nutritional Supplements (FEEDING SUPPLEMENT, PROSOURCE TF,) liquid Place 45 mLs into feeding tube 3 (three) times daily. Rate is 55 cc /hr. 08/16/21   Swayze, Ava, DO  ondansetron (ZOFRAN) 8 MG tablet Take 1 tablet (8 mg total) by mouth every 8 (eight) hours as needed (Starting day 4 after chemo as needed for nausea). 08/25/21   Ladell Pier, MD  pantoprazole sodium (PROTONIX) 40 mg Place 1 packet (40 mg) into feeding tube daily. 08/16/21   Swayze, Ava, DO  prochlorperazine (COMPAZINE) 10 MG tablet Take 1 tablet (10 mg total) by mouth every 6 (six) hours as needed for nausea or vomiting. 08/25/21   Ladell Pier, MD  promethazine (PHENERGAN) 6.25 MG/5ML syrup Take 12.5 via G-Tube every 6 hours as needed for nausea and vomiting. 08/26/21   Ladell Pier, MD  Water For Irrigation, Sterile (FREE WATER) SOLN Place 150 mLs into feeding tube every 4 (four)  hours. Rate is 55 cc/ hr. 08/16/21   Swayze, Ava, DO                                                                                                                                    Past Surgical History Past Surgical History:  Procedure Laterality Date  . BIOPSY  08/07/2021   Procedure: BIOPSY;  Surgeon: Milus Banister, MD;  Location: [facility];  Service: Gastroenterology;;  . COLONOSCOPY WITH  PROPOFOL N/A 05/05/2018   Procedure: COLONOSCOPY WITH PROPOFOL;  Surgeon: Irene Shipper, MD;  Location: [facility];  Service: Endoscopy;  Laterality: N/A;  . ESOPHAGOGASTRODUODENOSCOPY (EGD) WITH PROPOFOL N/A 08/07/2021   Procedure: ESOPHAGOGASTRODUODENOSCOPY (EGD) WITH PROPOFOL;  Surgeon: Milus Banister, MD;  Location: [facility];  Service: Gastroenterology;  Laterality: N/A;  . GASTROSTOMY N/A 08/10/2021   Procedure: INSERTION OF GASTROSTOMY TUBE;  Surgeon: Coralie Keens, MD;  Location: [facility];  Service: General;  Laterality: N/A;  . IR [place] GASTRO/COLONIC TUBE PERCUT W/FLUORO  09/16/2021  . PORTACATH PLACEMENT N/A 08/10/2021   Procedure: INSERTION PORT-A-CATH;  Surgeon: Coralie Keens, MD;  Location: [facility];  Service: General;  Laterality: N/A;   Family History Family History  Problem Relation Age of Onset  . Hypertension Other   . Colon cancer Neg Hx   . Stomach cancer Neg Hx   . Esophageal cancer Neg Hx   . Rectal cancer Neg Hx     Social History Social History   Tobacco Use  . Smoking status: Former    Packs/day: 0.25    Types: Cigarettes    Quit date: 05/2021    Years since quitting: 0.3    Passive exposure: Past  . Smokeless tobacco: Never  Vaping Use  . Vaping Use: Never used  Substance Use Topics  . Alcohol use: Yes    Alcohol/week: 6.0 standard drinks of alcohol    Types: 6 Cans of beer per week  . Drug use: No   Allergies Patient has no known allergies.  Review of Systems Review of Systems  All other systems reviewed and are negative.   Physical Exam Vital Signs  I have reviewed the triage vital signs BP 120/78 (BP Location: Right Arm)   Pulse 97   Temp 98 F (36.7 C) (Oral)   Resp (!) 22   SpO2 97%  Physical Exam Vitals and nursing note reviewed.  Constitutional:      General: He is not in acute distress.    Appearance: Normal appearance.  HENT:     Mouth/Throat:     Mouth: Mucous membranes are moist.  Cardiovascular:     Rate and  Rhythm: Normal rate and regular rhythm.  Pulmonary:     Effort: Pulmonary effort is normal. No respiratory distress.     Breath sounds: Normal breath sounds.  Abdominal:     General: Abdomen is flat.     Palpations: Abdomen is soft.  Tenderness: There is no abdominal tenderness.  Skin:    General: Skin is warm and dry.     Capillary Refill: Capillary refill takes less than 2 seconds.  Neurological:     Mental Status: He is alert and oriented to person, place, and time. Mental status is at baseline.  Psychiatric:        Mood and Affect: Mood normal.        Behavior: Behavior normal.     ED Results and Treatments Labs (all labs ordered are listed, but only abnormal results are displayed) Labs Reviewed  BASIC METABOLIC PANEL - Abnormal; Notable for the following components:      Result Value   Glucose, Bld 172 (*)    BUN 40 (*)    All other components within normal limits  CBC WITH DIFFERENTIAL/PLATELET - Abnormal; Notable for the following components:   WBC 3.9 (*)    RBC 3.37 (*)    Hemoglobin 9.3 (*)    HCT 29.5 (*)    All other components within normal limits  RESP PANEL BY RT-PCR (FLU A&B, COVID) ARPGX2  SARS CORONAVIRUS 2 BY RT PCR  BRAIN NATRIURETIC PEPTIDE  PROTIME-INR                                                                                                                          Radiology CT Angio Chest PE W and/or Wo Contrast  Result Date: 09/18/2021 CLINICAL DATA:  Pulmonary embolism.  Cough and shortness of breath. EXAM: CT ANGIOGRAPHY CHEST WITH CONTRAST TECHNIQUE: Multidetector CT imaging of the chest was performed using the standard protocol during bolus administration of intravenous contrast. Multiplanar CT image reconstructions and MIPs were obtained to evaluate the vascular anatomy. RADIATION DOSE REDUCTION: This exam was performed according to the departmental dose-optimization program which includes automated exposure control, adjustment of the  mA and/or kV according to patient size and/or use of iterative reconstruction technique. CONTRAST:  57m OMNIPAQUE IOHEXOL 350 MG/ML SOLN COMPARISON:  08/07/2021 FINDINGS: Cardiovascular: Heart size is normal. There are coronary artery calcifications. The pulmonary arteries are well opacified by the contrast bolus, and there is no acute pulmonary embolus. There is atherosclerotic calcification of the thoracic aorta not associated with aneurysm. Mediastinum/Nodes: Similar-appearing enlarged lymph nodes in the LOWER neck. The visualized portion of the thyroid gland has a normal appearance. Enlarged nodes are identified in the subcarinal, RIGHT paratracheal, and hilar regions and appears similar to prior. Lungs/Pleura: There has been improvement in pleural effusions. Numerous solid and cavitary lesions are identified throughout the lungs, as seen on multiple prior studies. Nodules have a similar distribution compared to prior study and measure up to 2.7 centimeters. Multiple nodules show progression from solid to cavitary lesions. Overall size and number is stable however. Upper Abdomen: Gallbladder is present. Small periaortic lymph node at the level of the celiac axis is 9 millimeters. Musculoskeletal: Moderate degenerative changes throughout the thoracic spine. Lytic lesion along the posterior vertebral body at T5  now measures 1.6 centimeters, previously 1.4 centimeters. A new lytic lesion is identified at T6 along the posterior vertebral body measures 0.7 centimeters. There is a new metastasis within the LEFT 10th rib, associated with acute pathologic fracture possibly accounting for the patient's pain. Review of the MIP images confirms the above findings. IMPRESSION: 1. Technically adequate exam showing no acute pulmonary embolus. 2. Interval progression of osseous metastatic disease. Increased size of T5 vertebral body lesion. New T6 vertebral body lesion. 3. Pathologic fracture of the LEFT 10th rib secondary to  metastasis which is new since the prior study. This may account for the patient's pain. 4. Persistent numerous pulmonary nodules, with multiple nodules progressing to cavitary morphology. 5. Similar supraclavicular, mediastinal, and hilar adenopathy. Electronically Signed   By: Nolon Nations M.D.   On: 09/18/2021 18:46   DG Chest 2 View  Result Date: 09/18/2021 CLINICAL DATA:  Productive cough and shortness of breath for a few days EXAM: CHEST - 2 VIEW COMPARISON:  Portable exam 1304 hours compared to 06/10/2021 Correlation CT chest 08/07/2021 FINDINGS: RIGHT subclavian Port-A-Cath with tip projecting over SVC. Normal heart size, mediastinal contours, and pulmonary vascularity. Atherosclerotic calcification aorta. BILATERAL pulmonary nodules consistent with pulmonary metastases. Cavitary nodule in the mid RIGHT lung 2.3 x 2.0 cm. No pleural effusion or pneumothorax. No acute osseous findings. IMPRESSION: Pulmonary metastases including a cavitary lesion 2.3 x 2.0 cm in the RIGHT mid lung, new since the prior chest radiograph 06/10/2021; patient did however have RIGHT mid lung nodules on an interval CT chest though the cavitation is new. Electronically Signed   By: Lavonia Dana M.D.   On: 09/18/2021 13:19    Pertinent labs & imaging results that were available during my care of the patient were reviewed by me and considered in my medical decision making (see MDM for details).  Medications Ordered in ED Medications  albuterol (VENTOLIN HFA) 108 (90 Base) MCG/ACT inhaler 2 puff (has no administration in time range)  ondansetron (ZOFRAN) injection 4 mg (4 mg Intravenous Given 09/18/21 1759)  morphine (PF) 4 MG/ML injection 4 mg (4 mg Intravenous Given 09/18/21 1759)  iohexol (OMNIPAQUE) 350 MG/ML injection 80 mL (80 mLs Intravenous Contrast Given 09/18/21 1822)                                                                                                                                      Procedures Procedures  (including critical care time)  Medical Decision Making / ED Course   MDM:  71 year old male presenting to the emergency department with cough.  Patient well-appearing.  Vital signs reassuring.  Not hypoxic.  No fever.  Given history of cancer, will obtain CT angiography to evaluate for PE.  Chest x-ray demonstrates new cavitary lesion which would also be better characterized on CT scan.  CT scan will further evaluate for pneumonia, suspect this may be likely given the patient's symptoms.  Cavitary lesion could represent pneumonia or cavitation of existing metastatic disease.  No pneumothorax on chest x-ray.  No wheezing to suggest COPD or asthma.  No peripheral edema to suggest CHF or volume overload and chest x-ray without signs of pulmonary edema.  Will reassess.  Clinical Course as of 09/18/21 2238  Fri Sep 18, 2021  1922 I reviewed CT scan, no sign of PE, demonstrates worsening metastatic disease with cavitary lesions, focal infiltrate.  I agree with radiology interpretation and independently visualized images.  Discussed results with patient, discussed monitoring symptoms in the hospital as well as pain control, versus discharged with close outpatient follow-up.  Patient prefers to treat pain at home and monitor symptoms.  Given no fevers, no sign of pneumonia on CTA chest, no leukocytosis, will defer antibiotics as doubt infection at this time. [WS]    Clinical Course User Index [WS] Cristie Hem, MD     Additional history obtained: -Additional history obtained from friend -External records from outside source obtained and reviewed including: Chart review including previous notes, labs, imaging, consultation notes   Lab Tests: -I ordered, reviewed, and interpreted labs.   The pertinent results include:   Labs Reviewed  BASIC METABOLIC PANEL - Abnormal; Notable for the following components:      Result Value   Glucose, Bld 172 (*)    BUN 40  (*)    All other components within normal limits  CBC WITH DIFFERENTIAL/PLATELET - Abnormal; Notable for the following components:   WBC 3.9 (*)    RBC 3.37 (*)    Hemoglobin 9.3 (*)    HCT 29.5 (*)    All other components within normal limits  RESP PANEL BY RT-PCR (FLU A&B, COVID) ARPGX2  SARS CORONAVIRUS 2 BY RT PCR  BRAIN NATRIURETIC PEPTIDE  PROTIME-INR      EKG   EKG Interpretation  Date/Time:  Friday September 18 2021 12:56:55 EDT Ventricular Rate:  99 PR Interval:  147 QRS Duration: 95 QT Interval:  356 QTC Calculation: 457 R Axis:   63 Text Interpretation: Sinus rhythm Confirmed by Garnette Gunner [phone]) on 09/18/2021 5:14:25 PM         Imaging Studies ordered: I ordered imaging studies including CTA chest I independently visualized and interpreted imaging. I agree with the radiologist interpretation   Medicines ordered and prescription drug management: Meds ordered this encounter  Medications  . albuterol (VENTOLIN HFA) 108 (90 Base) MCG/ACT inhaler 2 puff  . ondansetron (ZOFRAN) injection 4 mg  . morphine (PF) 4 MG/ML injection 4 mg  . iohexol (OMNIPAQUE) 350 MG/ML injection 80 mL    -I have reviewed the patients home medicines and have made adjustments as needed    Cardiac Monitoring: The patient was maintained on a cardiac monitor.  I personally viewed and interpreted the cardiac monitored which showed an underlying rhythm of: NSR  Social Determinants of Health:  Factors impacting patients care include: former smoker   Reevaluation: After the interventions noted above, I reevaluated the patient and found that they have :improved  Co morbidities that complicate the patient evaluation . Past Medical History:  Diagnosis Date  . Allergy   . Arthritis   . Hyperlipidemia   . Hypertension       Dispostion: Discharge     Final Clinical Impression(s) / ED Diagnoses Final diagnoses:  Malignant neoplasm metastatic to bone [facility])  Cavitary  lung disease     This chart was dictated using voice recognition software.  Despite best efforts  to proofread,  errors can occur which can change the documentation meaning.    Cristie Hem, MD 09/18/21 2238

## 2021-09-18 NOTE — Discharge Instructions (Signed)
We evaluated you in the emergency department for your cough and shortness of breath.  Your tests including a CT scan of your chest, did not show a pneumonia or blood clot in your lungs.  You have some new metastatic cancer in the bones of your spine and a rib. You also have growing cancer masses in your lungs.  Please follow-up closely with your oncologist.  We discussed staying in the hospital for pain control, but you decided it would be better to go home and treat your pain at home.  Please return to the emergency department if you develop any worsening symptoms, difficulty breathing, fevers, increased phlegm, numbness or tingling, or any other symptoms.

## 2021-09-18 NOTE — Telephone Encounter (Signed)
Leslie Livingston from [facility] called in and stated Leslie Livingston lungs sound crackles, painful when coughing. He is having shortness of breathing, increase coughing and weakness. Leslie Livingston also stated she think he's aspiration. Denied, chills or fever. Vitals 118/78 HR 92,96% Resp 20. Dr. Benay Spice advised the patient go to the [facility] ED. Patient agree to go to the [facility] ED.

## 2021-09-18 NOTE — ED Triage Notes (Signed)
Pt reports being sent from [facility] due to cough and SHOB since last night.

## 2021-09-20 ENCOUNTER — Other Ambulatory Visit: Payer: Self-pay | Admitting: Oncology

## 2021-09-21 ENCOUNTER — Ambulatory Visit: Payer: Medicare PPO | Admitting: Pulmonary Disease

## 2021-09-22 ENCOUNTER — Inpatient Hospital Stay: Payer: Medicare PPO

## 2021-09-22 ENCOUNTER — Other Ambulatory Visit: Payer: Self-pay

## 2021-09-22 ENCOUNTER — Other Ambulatory Visit: Payer: Self-pay | Admitting: *Deleted

## 2021-09-22 ENCOUNTER — Telehealth: Payer: Self-pay | Admitting: Dietician

## 2021-09-22 ENCOUNTER — Encounter: Payer: Self-pay | Admitting: Nurse Practitioner

## 2021-09-22 ENCOUNTER — Inpatient Hospital Stay: Payer: Medicare PPO | Admitting: Nurse Practitioner

## 2021-09-22 VITALS — BP 118/64 | HR 101 | Temp 97.6°F | Resp 18 | Wt 190.8 lb

## 2021-09-22 DIAGNOSIS — C153 Malignant neoplasm of upper third of esophagus: Secondary | ICD-10-CM

## 2021-09-22 DIAGNOSIS — Z5111 Encounter for antineoplastic chemotherapy: Secondary | ICD-10-CM | POA: Diagnosis not present

## 2021-09-22 LAB — CBC WITH DIFFERENTIAL (CANCER CENTER ONLY)
Abs Immature Granulocytes: 0.01 10*3/uL (ref 0.00–0.07)
Basophils Absolute: 0 10*3/uL (ref 0.0–0.1)
Basophils Relative: 1 %
Eosinophils Absolute: 0.1 10*3/uL (ref 0.0–0.5)
Eosinophils Relative: 3 %
HCT: 30.7 % — ABNORMAL LOW (ref 39.0–52.0)
Hemoglobin: 9.7 g/dL — ABNORMAL LOW (ref 13.0–17.0)
Immature Granulocytes: 0 %
Lymphocytes Relative: 40 %
Lymphs Abs: 1 10*3/uL (ref 0.7–4.0)
MCH: 26.6 pg (ref 26.0–34.0)
MCHC: 31.6 g/dL (ref 30.0–36.0)
MCV: 84.3 fL (ref 80.0–100.0)
Monocytes Absolute: 0.7 10*3/uL (ref 0.1–1.0)
Monocytes Relative: 30 %
Neutro Abs: 0.6 10*3/uL — ABNORMAL LOW (ref 1.7–7.7)
Neutrophils Relative %: 26 %
Platelet Count: 497 10*3/uL — ABNORMAL HIGH (ref 150–400)
RBC: 3.64 MIL/uL — ABNORMAL LOW (ref 4.22–5.81)
RDW: 15.9 % — ABNORMAL HIGH (ref 11.5–15.5)
WBC Count: 2.4 10*3/uL — ABNORMAL LOW (ref 4.0–10.5)
nRBC: 0 % (ref 0.0–0.2)

## 2021-09-22 LAB — PROTIME-INR
INR: 1.2 (ref 0.8–1.2)
Prothrombin Time: 15.5 seconds — ABNORMAL HIGH (ref 11.4–15.2)

## 2021-09-22 LAB — CMP (CANCER CENTER ONLY)
ALT: 31 U/L (ref 0–44)
AST: 17 U/L (ref 15–41)
Albumin: 3.5 g/dL (ref 3.5–5.0)
Alkaline Phosphatase: 107 U/L (ref 38–126)
Anion gap: 12 (ref 5–15)
BUN: 31 mg/dL — ABNORMAL HIGH (ref 8–23)
CO2: 26 mmol/L (ref 22–32)
Calcium: 10 mg/dL (ref 8.9–10.3)
Chloride: 98 mmol/L (ref 98–111)
Creatinine: 1.15 mg/dL (ref 0.61–1.24)
GFR, Estimated: >60 mL/min (ref >60)
Glucose, Bld: 133 mg/dL — ABNORMAL HIGH (ref 70–99)
Potassium: 4.2 mmol/L (ref 3.5–5.1)
Sodium: 136 mmol/L (ref 135–145)
Total Bilirubin: 0.5 mg/dL (ref 0.3–1.2)
Total Protein: 8.6 g/dL — ABNORMAL HIGH (ref 6.5–8.1)

## 2021-09-22 MED ORDER — SODIUM CHLORIDE 0.9 % IV SOLN: INTRAVENOUS | Status: DC

## 2021-09-22 NOTE — Patient Instructions (Signed)
Rehydration, Elderly Rehydration is the replacement of body fluids, salts, and minerals (electrolytes) that are lost during dehydration. Dehydration is when there is not enough water or other fluids in the body. This happens when you lose more fluids than you take in. People who are age 72 or older have a higher risk of dehydration than younger adults. Common causes of dehydration include: Conditions that cause loss of water or other fluids, such as diarrhea, vomiting, sweating, or urinating a lot. Not drinking enough fluids. This can occur when you are ill or doing activities that require a lot of energy, especially in hot weather. Other illnesses and conditions, such as fever or infection. Certain medicines, such as those that remove excess fluid from the body (diuretics). Not being able to get enough water and food. Symptoms of mild or moderate dehydration may include thirst, dry lips and mouth, and dizziness. Symptoms of severe dehydration may include increased heart rate, confusion, fainting, and not urinating. For severe dehydration, you may need to get fluids through an IV at the hospital. For mild or moderate dehydration, you can usually rehydrate at home by drinking certain fluids as told by your health care provider. What are the risks? Generally, rehydration is safe. However, taking in too much fluid (overhydration) can be a problem. This is rare. Overhydration can cause an electrolyte imbalance, kidney failure, fluid in the lungs, or a decrease in salt (sodium) levels in the body. Supplies needed: You will need an oral rehydration solution (ORS) if your health care provider tells you to use one. This is a drink designed to treat dehydration. It can be found in pharmacies and retail stores. How to rehydrate Fluids Follow instructions from your health care provider for rehydration. The kind of fluid and the amount you should drink depend on your condition. In general, for mild dehydration,  you should choose drinks that you prefer. If told by your health care provider, drink an ORS. Make an ORS by following instructions on the package. Start by drinking small amounts, about  cup (120 mL) every 5-10 minutes. Slowly increase how much you drink until you have taken the amount recommended by your health care provider. Drink enough fluids to keep your urine pale yellow. If you were told to drink an ORS, finish the ORS first, then start slowly drinking other clear fluids. Drink fluids such as: Water. This includes sparkling water and flavored water. Drinking only waterwhile rehydrating can lead to having too little sodium in your body (hyponatremia). Follow instructions from your health care provider. Water from ice chips you suck on. Fruit juice with water you add to it(diluted). Sports drinks. Hot or cold herbal teas. Broth-based soups. Coffee. Milk or milk products. Food Follow instructions from your health care provider about what to eat while you rehydrate. Your health care provider may recommend that you slowly begin eating regular foods in small amounts. Eat foods that contain a healthy balance of electrolytes, such as bananas, oranges, potatoes, tomatoes, and spinach. Avoid foods that are greasy or contain a lot of sugar. In some cases, you may get nutrition through a feeding tube that is passed through your nose and into your stomach (nasogastric tube, or NG tube). This may be done if you have uncontrolled vomiting or diarrhea. Beverages to avoid  Certain beverages may make dehydration worse. While you rehydrate, avoid drinking alcohol. How to tell if you are recovering from dehydration You may be recovering from dehydration if: You are urinating more often than before   you started rehydrating. Your urine is pale yellow. Your energy level improves. You vomit less frequently. You have diarrhea less frequently. Your appetite improves or returns to normal. You feel less  dizzy or less light-headed. Your skin tone and color start to look more normal. Follow these instructions at home: Take over-the-counter and prescription medicines only as told by your health care provider. Do not take sodium tablets. Doing this can lead to having too much sodium in your body (hypernatremia). Contact a health care provider if: You continue to have symptoms of mild or moderate dehydration, such as: Thirst. Dry lips. Slightly dry mouth. Dizziness. Dark urine or less urine than usual. Muscle cramps. You continue to vomit or have diarrhea. Get help right away if you: Have symptoms of dehydration that get worse. Have a fever. Have a severe headache. Have been vomiting and the following happens: Your vomiting gets worse. Your vomit includes blood or green matter (bile). You cannot eat or drink without vomiting. Have problems with urination or bowel movements, such as: Diarrhea that gets worse. Blood in your stool (feces). This may cause stool to look black and tarry. Not urinating, or urinating only a small amount of very dark urine, within 6-8 hours. Have trouble breathing. Have symptoms that get worse with treatment. These symptoms may represent a serious problem that is an emergency. Do not wait to see if the symptoms will go away. Get medical help right away. Call your local emergency services (911 in the [place] Do not drive yourself to the hospital. Summary Rehydration is the replacement of body fluids, salts, and minerals (electrolytes) that are lost during dehydration. Follow instructions from your health care provider for rehydration. The kind of fluid and the amount you should drink depend on your condition. Slowly increase how much you drink until you have taken the amount recommended by your health care provider. Contact your health care provider if you continue to show signs of mild or moderate dehydration. This information is not intended to replace advice  given to you by your health care provider. Make sure you discuss any questions you have with your health care provider. Document Revised: 03/14/2019 Document Reviewed: 03/01/2019 Elsevier Patient Education  2023 [employer]  

## 2021-09-22 NOTE — Telephone Encounter (Signed)
Nutrition Follow-up:  Patient with metastatic esophageal cancer. S/p Gtube 7/7. He has completed radiation under the care of Dr. Tammi Klippel (7/20-8/10). Patient currently receiving FOLFOX + Nivolumab q14d (started 8/15).   Noted pt seen in ED 8/25 for cough and shortness of breath.   Spoke with daughter of patient via telephone. They care currently at [facility] for appointments waiting to be seen by Dr. Benay Spice. Daughter reports patient has been tolerating bolus feedings well except for the last 2 days. Daughter reports patient is nauseas and vomiting after feedings. She is concerned about 8/25 ED imaging results revealing new lesions and pathologic rib fracture.   Patient reports MD has entered room and she will return call as able.

## 2021-09-22 NOTE — Progress Notes (Signed)
Patient wants IVF today. Chemo on hold

## 2021-09-22 NOTE — Progress Notes (Signed)
  Leslie Livingston OFFICE PROGRESS NOTE   Diagnosis: Esophagus cancer  INTERVAL HISTORY:   Leslie Livingston returns as scheduled.  He completed cycle 1 FOLFOX/nivolumab 09/08/2021.  He was seen in the emergency department on 09/18/2021 for evaluation of cough and shortness of breath.  CT chest negative for PE; pathologic fracture left 10th rib; persistent numerous pulmonary nodules with multiple nodules progressing to cavitary morphology; progression of osseous metastatic disease.  He reports mild nausea following chemotherapy.  No mouth sores.  No diarrhea.  No rash.  He continues tube feedings.  He complains of hiccups.  Objective:  Vital signs in last 24 hours:  Blood pressure 118/64, pulse (!) 101, temperature 97.6 F (36.4 C), temperature source Tympanic, resp. rate 18, weight 190 lb 12.8 oz (86.5 kg), SpO2 99 %.    HEENT: No thrush or ulcers. Resp: Bilateral expiratory rhonchi.  Improves with coughing.  No respiratory distress. Cardio: Regular rate and rhythm. GI: No hepatomegaly.  Left abdomen feeding tube site without evidence of infection. Vascular: No leg edema. Skin: No rash. Port-A-Cath without erythema.  Lab Results:  Lab Results  Component Value Date   WBC 2.4 (L) 09/22/2021   HGB 9.7 (L) 09/22/2021   HCT 30.7 (L) 09/22/2021   MCV 84.3 09/22/2021   PLT 497 (H) 09/22/2021   NEUTROABS 0.6 (L) 09/22/2021    Imaging:  No results found.  Medications: I have reviewed the patient's current medications.  Assessment/Plan: Metastatic esophageal cancer CT neck 07/29/2021-adenopathy at the thoracic inlet and lower bilateral jugular chains, esophagus mass, left vocal cord paralysis CT chest 07/29/2021-diffuse esophageal wall thickening, most pronounced in the proximal esophagus, multiple pulmonary nodules, mediastinal and supraclavicular lymphadenopathy Upper endoscopy 08/07/2021-mass/stricture extending from the UES to 30 cm CTs chest, abdomen, and pelvis  08/07/2021-long segment wall thickening of the esophagus, prominent proximally, multiple large supraclavicular mediastinal nodes, bilateral lung nodules,, and upper abdominal nodes similar to remote abdominal CT Biopsy 08/07/2021 showed invasive moderately differentiated squamous cell carcinoma Foundation 1-MSS, tumor mutation burden 7, PD-L1 CPS 10 Esophagus radiation 08/13/2021 - 09/03/2021 Cycle 1 FOLFOX/nivolumab 09/08/2021 Cycle 2 held 09/22/2021 due to neutropenia   Solid/liquid dysphagia secondary to #1 -Gastrostomy tube placement 07/31/2021 Weight loss secondary to #1 Odynophagia secondary to #1 History of colon polyps-tubular adenomas History of a diverticular bleed-April 2020 Anemia Thrombocytosis Left subclavian Port-A-Cath placement 08/10/2021 Left upper extremity edema -Doppler ultrasound of left upper extremity 08/13/2021-negative for DVT  Disposition: Leslie Livingston appears unchanged.  He has completed 1 cycle of FOLFOX/nivolumab.  Overall he seems to have tolerated well.  Review of the CBC from today shows neutropenia.  We are holding today's treatment and rescheduling for 1 week.  Neutropenic precautions reviewed.  He and his daughter understand to contact the office with fever, chills, other signs of infection.  Plan for white cell growth factor support on day of pump discontinuation.  Potential side effects reviewed including bone pain, rash, splenic rupture.  They are in agreement with this plan.  He will return for lab, follow-up, cycle 2 FOLFOX and nivolumab in 1 week.  He will contact the office in the interim as outlined above or with any other problems.    Ned Card ANP/GNP-BC   09/22/2021  9:43 AM

## 2021-09-23 ENCOUNTER — Other Ambulatory Visit: Payer: Self-pay

## 2021-09-23 ENCOUNTER — Emergency Department (HOSPITAL_COMMUNITY): Payer: Medicare PPO

## 2021-09-23 ENCOUNTER — Emergency Department (HOSPITAL_COMMUNITY)
Admission: EM | Admit: 2021-09-23 | Discharge: 2021-09-23 | Disposition: A | Discharge status: Home / Self Care | Payer: Medicare PPO | Attending: Emergency Medicine | Admitting: Emergency Medicine

## 2021-09-23 ENCOUNTER — Other Ambulatory Visit (HOSPITAL_COMMUNITY): Payer: Self-pay

## 2021-09-23 DIAGNOSIS — Z20822 Contact with and (suspected) exposure to covid-19: Secondary | ICD-10-CM | POA: Diagnosis not present

## 2021-09-23 DIAGNOSIS — R52 Pain, unspecified: Secondary | ICD-10-CM

## 2021-09-23 DIAGNOSIS — C153 Malignant neoplasm of upper third of esophagus: Secondary | ICD-10-CM | POA: Insufficient documentation

## 2021-09-23 DIAGNOSIS — J029 Acute pharyngitis, unspecified: Secondary | ICD-10-CM | POA: Diagnosis present

## 2021-09-23 LAB — CBC WITH DIFFERENTIAL/PLATELET
Abs Immature Granulocytes: 0.01 K/uL (ref 0.00–0.07)
Basophils Absolute: 0 K/uL (ref 0.0–0.1)
Basophils Relative: 1 %
Eosinophils Absolute: 0.1 K/uL (ref 0.0–0.5)
Eosinophils Relative: 3 %
HCT: 31.8 % — ABNORMAL LOW (ref 39.0–52.0)
Hemoglobin: 9.6 g/dL — ABNORMAL LOW (ref 13.0–17.0)
Immature Granulocytes: 0 %
Lymphocytes Relative: 31 %
Lymphs Abs: 0.8 K/uL (ref 0.7–4.0)
MCH: 27 pg (ref 26.0–34.0)
MCHC: 30.2 g/dL (ref 30.0–36.0)
MCV: 89.6 fL (ref 80.0–100.0)
Monocytes Absolute: 0.8 K/uL (ref 0.1–1.0)
Monocytes Relative: 32 %
Neutro Abs: 0.9 K/uL — ABNORMAL LOW (ref 1.7–7.7)
Neutrophils Relative %: 33 %
Platelets: 464 K/uL — ABNORMAL HIGH (ref 150–400)
RBC: 3.55 MIL/uL — ABNORMAL LOW (ref 4.22–5.81)
RDW: 15.9 % — ABNORMAL HIGH (ref 11.5–15.5)
WBC: 2.7 K/uL — ABNORMAL LOW (ref 4.0–10.5)
nRBC: 0 % (ref 0.0–0.2)

## 2021-09-23 LAB — BASIC METABOLIC PANEL WITH GFR
Anion gap: 8 (ref 5–15)
BUN: 33 mg/dL — ABNORMAL HIGH (ref 8–23)
CO2: 26 mmol/L (ref 22–32)
Calcium: 9.3 mg/dL (ref 8.9–10.3)
Chloride: 105 mmol/L (ref 98–111)
Creatinine, Ser: 0.96 mg/dL (ref 0.61–1.24)
GFR, Estimated: >60 mL/min
Glucose, Bld: 149 mg/dL — ABNORMAL HIGH (ref 70–99)
Potassium: 3.9 mmol/L (ref 3.5–5.1)
Sodium: 139 mmol/L (ref 135–145)

## 2021-09-23 LAB — SARS CORONAVIRUS 2 BY RT PCR: SARS Coronavirus 2 by RT PCR: NEGATIVE

## 2021-09-23 MED ORDER — OXYCODONE HCL 5 MG/5ML PO SOLN: 10.0000 mg | Freq: Four times a day (QID) | ORAL | 0 refills | Status: DC | PRN

## 2021-09-23 MED ORDER — OXYCODONE HCL 5 MG/5ML PO SOLN
10.0000 mg | Freq: Four times a day (QID) | ORAL | 0 refills | Status: DC | PRN
  Filled 2021-09-23: qty 150, 4d supply, fill #0

## 2021-09-23 MED ORDER — OXYCODONE HCL 5 MG/5ML PO SOLN
10.0000 mg | ORAL | Status: AC
  Administered 2021-09-23: 10 mg
  Filled 2021-09-23: qty 10

## 2021-09-23 MED ORDER — OXYCODONE HCL 5 MG/5ML PO SOLN
10.0000 mg | ORAL | Status: AC
  Administered 2021-09-23: 10 mg via ORAL
  Filled 2021-09-23: qty 10

## 2021-09-23 NOTE — Discharge Instructions (Signed)
Today you were seen in the emergency department for your pain.    In the emergency department you had lab work and x-ray that were reassuring.    At home, please stop taking the morphine and start taking the oxycodone solution through your G-tube.    Follow-up with your primary doctor in 2-3 days regarding your visit.  Please also follow-up with your hematologist as soon as possible.  Return immediately to the emergency department if you experience any of the following: Difficulty breathing, worsening pain, or any other concerning symptoms.    Thank you for visiting our Emergency Department. It was a pleasure taking care of you today.

## 2021-09-23 NOTE — ED Provider Notes (Addendum)
[facility] DEPT Provider Note   CSN: [id number] Arrival date & time: 09/23/21  1133     History  Chief Complaint  Patient presents with   Sore Throat    GLENMORE KARL is a 71 y.o. male.  71 year old male with a history of esophageal cancer on chemo and status post radiation who presents diffuse pain.  History obtained per the patient and his daughter who states that over the past several months he has had pain in his throat, chest, back, and extremities.  Says that they have been treating at home with 0.5 mL of sublingual morphine every 4-6 hours.  Reports that he has had persistent pain since then.  Was seen yesterday in oncology clinic with an overall reassuring evaluation.  Home health nurse saw him today and thought that he should be evaluated for possible admission for intractable pain.  Currently stays at home with his daughter and has a home health nurse once per week.  The patient and his daughter state that he has had a chronic cough for several months as well as shortness of breath and difficulty swallowing.  No recent fevers or new cough or new difficulty tolerating secretions.    Sore Throat       Home Medications Prior to Admission medications   Medication Sig Start Date End Date Taking? Authorizing Provider  amLODipine (NORVASC) 5 MG tablet Take 1 tablet (5 mg total) by mouth daily. 08/17/21   Swayze, Ava, DO  fluconazole (DIFLUCAN) 10 MG/ML suspension Take 10 ml (100 mg total)via G tube daily for 5 days. 08/26/21   Ladell Pier, MD  lidocaine-prilocaine (EMLA) cream Apply 1 Application topically as needed. 08/26/21   Ladell Pier, MD  lisinopril (ZESTRIL) 20 MG tablet Take by mouth.    [provider]  Nutritional Supplements (FEEDING SUPPLEMENT, OSMOLITE 1.5 CAL,) LIQD Place 1,000 mLs into feeding tube continuous. Rate is 55 cc/hr. 08/16/21   Swayze, Ava, DO  Nutritional Supplements (FEEDING SUPPLEMENT, OSMOLITE 1.5 CAL,)  LIQD Place 1,000 mLs into feeding tube continuous. Rate: 55 cc/hr. 08/16/21   Swayze, Ava, DO  Nutritional Supplements (FEEDING SUPPLEMENT, PROSOURCE TF,) liquid Place 45 mLs into feeding tube 3 (three) times daily. Rate is 55 cc /hr. 08/16/21   Swayze, Ava, DO  ondansetron (ZOFRAN) 8 MG tablet Take 1 tablet (8 mg total) by mouth every 8 (eight) hours as needed (Starting day 4 after chemo as needed for nausea). 08/25/21   Ladell Pier, MD  oxyCODONE (ROXICODONE) 5 MG/5ML solution Place 10 mLs (10 mg total) into feeding tube every 6 (six) hours as needed for up to 5 days for severe pain. 09/23/21 09/28/21  Fransico Meadow, MD  pantoprazole sodium (PROTONIX) 40 mg Place 1 packet (40 mg) into feeding tube daily. 08/16/21   Swayze, Ava, DO  prochlorperazine (COMPAZINE) 10 MG tablet Take 1 tablet (10 mg total) by mouth every 6 (six) hours as needed for nausea or vomiting. 08/25/21   Ladell Pier, MD  promethazine (PHENERGAN) 6.25 MG/5ML syrup Take 12.5 via G-Tube every 6 hours as needed for nausea and vomiting. 08/26/21   Ladell Pier, MD  Water For Irrigation, Sterile (FREE WATER) SOLN Place 150 mLs into feeding tube every 4 (four) hours. Rate is 55 cc/ hr. 08/16/21   Swayze, Ava, DO      Allergies    Patient has no known allergies.    Review of Systems   Review of Systems  Physical  Exam Updated Vital Signs BP 134/89   Pulse 81   Temp 98.8 F (37.1 C) (Oral)   Resp 17   Ht 6' 1.5" (1.867 m)   Wt 86.5 kg   SpO2 95%   BMI 24.83 kg/m  Physical Exam Vitals and nursing note reviewed.  Constitutional:      General: He is not in acute distress.    Appearance: He is well-developed.     Comments: Coughing occasionally.  In no acute distress.  Does have garbled voice which he states is chronic.  HENT:     Head: Normocephalic and atraumatic.     Right Ear: External ear normal.     Left Ear: External ear normal.     Nose: Nose normal.  Eyes:     Extraocular Movements: Extraocular movements  intact.     Conjunctiva/sclera: Conjunctivae normal.     Pupils: Pupils are equal, round, and reactive to light.  Cardiovascular:     Rate and Rhythm: Normal rate and regular rhythm.     Heart sounds: Normal heart sounds.  Pulmonary:     Effort: Pulmonary effort is normal. No respiratory distress.     Breath sounds: Normal breath sounds. No stridor.  Abdominal:     General: There is no distension.     Palpations: Abdomen is soft. There is no mass.     Tenderness: There is no abdominal tenderness. There is no guarding.     Comments: G-tube in place  Musculoskeletal:        General: No swelling.     Cervical back: Normal range of motion and neck supple.  Skin:    General: Skin is warm and dry.     Capillary Refill: Capillary refill takes less than 2 seconds.  Neurological:     Mental Status: He is alert. Mental status is at baseline.  Psychiatric:        Mood and Affect: Mood normal.        Behavior: Behavior normal.     ED Results / Procedures / Treatments   Labs (all labs ordered are listed, but only abnormal results are displayed) Labs Reviewed  BASIC METABOLIC PANEL - Abnormal; Notable for the following components:      Result Value   Glucose, Bld 149 (*)    BUN 33 (*)    All other components within normal limits  CBC WITH DIFFERENTIAL/PLATELET - Abnormal; Notable for the following components:   WBC 2.7 (*)    RBC 3.55 (*)    Hemoglobin 9.6 (*)    HCT 31.8 (*)    RDW 15.9 (*)    Platelets 464 (*)    Neutro Abs 0.9 (*)    All other components within normal limits  SARS CORONAVIRUS 2 BY RT PCR    EKG None  Radiology DG Chest 2 View  Result Date: 09/23/2021 CLINICAL DATA:  Cough EXAM: CHEST - 2 VIEW COMPARISON:  Chest x-ray dated September 18, 2021 FINDINGS: Cardiac and mediastinal contours are unchanged. Left chest wall port unchanged in position. Bilateral pulmonary nodules, some of which are cavitary, unchanged when compared with the prior exam. Left basilar  atelectasis. No large pleural effusion or pneumothorax. IMPRESSION: Bilateral solid pulmonary nodules, some of which are cavitary, unchanged when compared with prior exam, compatible with metastatic disease. No new airspace opacity. Electronically Signed   By: Yetta Glassman M.D.   On: 09/23/2021 15:49    Procedures Procedures   Medications Ordered in ED Medications  oxyCODONE (  ROXICODONE) 5 MG/5ML solution 10 mg (10 mg Per Tube Given 09/23/21 1244)  oxyCODONE (ROXICODONE) 5 MG/5ML solution 10 mg (10 mg Oral Given 09/23/21 1630)    ED Course/ Medical Decision Making/ A&P Clinical Course as of 09/23/21 1945  Wed Sep 23, 2021  1443 Repaged oncology. [RP]  2751 Spoke with Dr Marin Olp.  [RP]  7001 Chest x-ray reviewed and interpreted by me and does show nodules concerning for metastasis that were present on prior x-ray.  No new infiltrates or signs of pneumonia. [RP]    Clinical Course User Index [RP] Fransico Meadow, MD                           Medical Decision Making Amount and/or Complexity of Data Reviewed Labs: ordered. Radiology: ordered.  Risk Prescription drug management.   71 year old male with a history of esophageal cancer on chemo and status post radiation who presents diffuse pain.  Initial Ddx:  Metastatic pain, airway compromise, pneumonia, COVID  MDM:  Appears that patient's complaints today are all chronic aside from the worsening pain that is diffuse.  No signs of airway compromise at this time or changes to his ability to tolerate his secretions.  No stridor on exam he does not appear to be in any acute distress.  With his cough have considered infection as well but feel that this is less likely given the chronicity of his symptoms.  Plan:  Labs Chest x-ray COVID Oxycodone solution through G-tube  ED Summary:  Patient underwent the above work-up which did not reveal any acute abnormalities or infection.  He was feeling much better after the oxycodone  and stated that he would prefer to have this instead of morphine.  Patient was given a prescription for the oxycodone solution which was delivered to the bedside and informed that he will need to stop the morphine.  His daughter was also updated regarding the plan and is coming to pick him up shortly.  Will have him fu with his PCP and oncology regarding his symptoms. Also discussed with Dr Marin Olp from hematology.   Dispo: DC Home. Return precautions discussed including, but not limited to, those listed in the AVS. Allowed pt time to ask questions which were answered fully prior to dc.   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

## 2021-09-23 NOTE — ED Triage Notes (Signed)
Pt BIB [facility] for ongoing throat pain, difficulty swallowing. Pt has CA- his provider told him to come get admitted for continued pain.

## 2021-09-24 ENCOUNTER — Inpatient Hospital Stay: Payer: Medicare PPO

## 2021-09-29 ENCOUNTER — Inpatient Hospital Stay: Payer: Medicare PPO | Admitting: Licensed Clinical Social Worker

## 2021-09-29 ENCOUNTER — Inpatient Hospital Stay: Payer: Medicare PPO | Attending: Oncology

## 2021-09-29 ENCOUNTER — Inpatient Hospital Stay: Payer: Medicare PPO

## 2021-09-29 ENCOUNTER — Other Ambulatory Visit: Payer: Self-pay | Admitting: *Deleted

## 2021-09-29 ENCOUNTER — Encounter: Payer: Self-pay | Admitting: Licensed Clinical Social Worker

## 2021-09-29 ENCOUNTER — Encounter: Payer: Self-pay | Admitting: Nurse Practitioner

## 2021-09-29 ENCOUNTER — Other Ambulatory Visit (HOSPITAL_BASED_OUTPATIENT_CLINIC_OR_DEPARTMENT_OTHER): Payer: Self-pay

## 2021-09-29 ENCOUNTER — Inpatient Hospital Stay: Payer: Medicare PPO | Admitting: Nurse Practitioner

## 2021-09-29 VITALS — BP 112/75 | HR 76

## 2021-09-29 VITALS — BP 113/64 | HR 95 | Temp 98.3°F | Resp 20 | Wt 174.6 lb

## 2021-09-29 DIAGNOSIS — R634 Abnormal weight loss: Secondary | ICD-10-CM | POA: Insufficient documentation

## 2021-09-29 DIAGNOSIS — R918 Other nonspecific abnormal finding of lung field: Secondary | ICD-10-CM | POA: Insufficient documentation

## 2021-09-29 DIAGNOSIS — C153 Malignant neoplasm of upper third of esophagus: Secondary | ICD-10-CM | POA: Diagnosis not present

## 2021-09-29 DIAGNOSIS — D649 Anemia, unspecified: Secondary | ICD-10-CM | POA: Insufficient documentation

## 2021-09-29 DIAGNOSIS — C159 Malignant neoplasm of esophagus, unspecified: Secondary | ICD-10-CM

## 2021-09-29 DIAGNOSIS — R590 Localized enlarged lymph nodes: Secondary | ICD-10-CM | POA: Insufficient documentation

## 2021-09-29 DIAGNOSIS — D75839 Thrombocytosis, unspecified: Secondary | ICD-10-CM | POA: Insufficient documentation

## 2021-09-29 DIAGNOSIS — Z5111 Encounter for antineoplastic chemotherapy: Secondary | ICD-10-CM | POA: Diagnosis present

## 2021-09-29 DIAGNOSIS — Z79899 Other long term (current) drug therapy: Secondary | ICD-10-CM | POA: Diagnosis not present

## 2021-09-29 DIAGNOSIS — R600 Localized edema: Secondary | ICD-10-CM | POA: Insufficient documentation

## 2021-09-29 LAB — CMP (CANCER CENTER ONLY)
ALT: 17 U/L (ref 0–44)
AST: 15 U/L (ref 15–41)
Albumin: 3.5 g/dL (ref 3.5–5.0)
Alkaline Phosphatase: 95 U/L (ref 38–126)
Anion gap: 10 (ref 5–15)
BUN: 24 mg/dL — ABNORMAL HIGH (ref 8–23)
CO2: 27 mmol/L (ref 22–32)
Calcium: 9.9 mg/dL (ref 8.9–10.3)
Chloride: 98 mmol/L (ref 98–111)
Creatinine: 0.83 mg/dL (ref 0.61–1.24)
GFR, Estimated: >60 mL/min (ref >60)
Glucose, Bld: 117 mg/dL — ABNORMAL HIGH (ref 70–99)
Potassium: 3.8 mmol/L (ref 3.5–5.1)
Sodium: 135 mmol/L (ref 135–145)
Total Bilirubin: 0.4 mg/dL (ref 0.3–1.2)
Total Protein: 8.9 g/dL — ABNORMAL HIGH (ref 6.5–8.1)

## 2021-09-29 LAB — CBC WITH DIFFERENTIAL (CANCER CENTER ONLY)
Abs Immature Granulocytes: 0.14 10*3/uL — ABNORMAL HIGH (ref 0.00–0.07)
Basophils Absolute: 0.1 10*3/uL (ref 0.0–0.1)
Basophils Relative: 1 %
Eosinophils Absolute: 0.1 10*3/uL (ref 0.0–0.5)
Eosinophils Relative: 1 %
HCT: 32.6 % — ABNORMAL LOW (ref 39.0–52.0)
Hemoglobin: 10.2 g/dL — ABNORMAL LOW (ref 13.0–17.0)
Immature Granulocytes: 2 %
Lymphocytes Relative: 17 %
Lymphs Abs: 1.3 10*3/uL (ref 0.7–4.0)
MCH: 27 pg (ref 26.0–34.0)
MCHC: 31.3 g/dL (ref 30.0–36.0)
MCV: 86.2 fL (ref 80.0–100.0)
Monocytes Absolute: 0.9 10*3/uL (ref 0.1–1.0)
Monocytes Relative: 12 %
Neutro Abs: 5.1 10*3/uL (ref 1.7–7.7)
Neutrophils Relative %: 67 %
Platelet Count: 452 10*3/uL — ABNORMAL HIGH (ref 150–400)
RBC: 3.78 MIL/uL — ABNORMAL LOW (ref 4.22–5.81)
RDW: 16 % — ABNORMAL HIGH (ref 11.5–15.5)
WBC Count: 7.6 10*3/uL (ref 4.0–10.5)
nRBC: 0 % (ref 0.0–0.2)

## 2021-09-29 LAB — TSH: TSH: 0.869 u[IU]/mL (ref 0.350–4.500)

## 2021-09-29 MED ORDER — FLUOROURACIL CHEMO INJECTION 2.5 GM/50ML
400.0000 mg/m2 | Freq: Once | INTRAVENOUS | Status: AC
  Administered 2021-09-29: 800 mg via INTRAVENOUS
  Filled 2021-09-29: qty 16

## 2021-09-29 MED ORDER — SODIUM CHLORIDE 0.9 % IV SOLN
240.0000 mg | Freq: Once | INTRAVENOUS | Status: AC
  Administered 2021-09-29: 240 mg via INTRAVENOUS
  Filled 2021-09-29: qty 24

## 2021-09-29 MED ORDER — PALONOSETRON HCL INJECTION 0.25 MG/5ML
0.2500 mg | Freq: Once | INTRAVENOUS | Status: AC
  Administered 2021-09-29: 0.25 mg via INTRAVENOUS
  Filled 2021-09-29: qty 5

## 2021-09-29 MED ORDER — SODIUM CHLORIDE 0.9 % IV SOLN
2000.0000 mg/m2 | INTRAVENOUS | Status: DC
  Administered 2021-09-29: 4050 mg via INTRAVENOUS
  Filled 2021-09-29: qty 81

## 2021-09-29 MED ORDER — OXALIPLATIN CHEMO INJECTION 100 MG/20ML
85.0000 mg/m2 | Freq: Once | INTRAVENOUS | Status: AC
  Administered 2021-09-29: 175 mg via INTRAVENOUS
  Filled 2021-09-29: qty 35

## 2021-09-29 MED ORDER — OXYCODONE HCL 5 MG/5ML PO SOLN
5.0000 mg | Freq: Four times a day (QID) | ORAL | 0 refills | Status: DC | PRN
  Filled 2021-09-29: qty 200, 5d supply, fill #0

## 2021-09-29 MED ORDER — LEUCOVORIN CALCIUM INJECTION 350 MG
400.0000 mg/m2 | Freq: Once | INTRAVENOUS | Status: AC
  Administered 2021-09-29: 812 mg via INTRAVENOUS
  Filled 2021-09-29: qty 40.6

## 2021-09-29 MED ORDER — SODIUM CHLORIDE 0.9 % IV SOLN
10.0000 mg | Freq: Once | INTRAVENOUS | Status: AC
  Administered 2021-09-29: 10 mg via INTRAVENOUS
  Filled 2021-09-29: qty 10

## 2021-09-29 MED ORDER — DEXTROSE 5 % IV SOLN: Freq: Once | INTRAVENOUS | Status: AC

## 2021-09-29 NOTE — Progress Notes (Signed)
Patient seen by Ned Card NP today  Vitals are within treatment parameters.  Labs reviewed by Ned Card NP and are within treatment parameters.  Per physician team, patient is ready for treatment. Please note that modifications are being made to the treatment plan including Chemotherapy doses adjusted due to weight loss

## 2021-09-29 NOTE — Progress Notes (Signed)
[facility] CSW Progress Note  [employer] representative contacted caregiver by phone to discuss DME, Medicaid and mediation concerns. CSW spoke with patient's daughter, Ms. Nori Riis and updated on DME information, Medicaid criteria and questions about the pharmacy. Ms. Nori Riis was given a prescription for the DME, Mr. Meroney currently does not currently meet medicaid criteria and pharmacy will contact Ms. Nori Riis to discuss medication cost concerns. Ms. Ramiro Harvest understanding.    Adelene Amas, LCSW

## 2021-09-29 NOTE — Addendum Note (Signed)
Addended by: Betsy Coder B on: 09/29/2021 12:57 PM   Modules accepted: Orders

## 2021-09-29 NOTE — Progress Notes (Addendum)
  Leslie Livingston   Diagnosis: Esophagus cancer  INTERVAL HISTORY:   Leslie Livingston returns as scheduled.  He completed cycle 1 FOLFOX/nivolumab 09/08/2021.  Cycle 2 was held on 09/22/2021 due to neutropenia.  He continues to have pain in multiple locations.  He notes improved tolerance with oxycodone rather than morphine.  He would like a refill.  He continues tube feedings.  He has lost weight since his last visit.  Objective:  Vital signs in last 24 hours:  Blood pressure 113/64, pulse 95, temperature 98.3 F (36.8 C), temperature source Tympanic, resp. rate 20, weight 174 lb 9.6 oz (79.2 kg), SpO2 99 %.    HEENT: White coating over tongue. Resp: Scattered rhonchi.  No respiratory distress. Cardio: Regular rate and rhythm. GI: No hepatomegaly.  Left abdomen feeding tube site without evidence of infection. Vascular: No leg edema. Skin: No rash. Port-A-Cath without erythema.   Lab Results:  Lab Results  Component Value Date   WBC 7.6 09/29/2021   HGB 10.2 (L) 09/29/2021   HCT 32.6 (L) 09/29/2021   MCV 86.2 09/29/2021   PLT 452 (H) 09/29/2021   NEUTROABS 5.1 09/29/2021    Imaging:  No results found.  Medications: I have reviewed the patient's current medications.  Assessment/Plan: Metastatic esophageal cancer CT neck 07/29/2021-adenopathy at the thoracic inlet and lower bilateral jugular chains, esophagus mass, left vocal cord paralysis CT chest 07/29/2021-diffuse esophageal wall thickening, most pronounced in the proximal esophagus, multiple pulmonary nodules, mediastinal and supraclavicular lymphadenopathy Upper endoscopy 08/07/2021-mass/stricture extending from the UES to 30 cm CTs chest, abdomen, and pelvis 08/07/2021-long segment wall thickening of the esophagus, prominent proximally, multiple large supraclavicular mediastinal nodes, bilateral lung nodules,, and upper abdominal nodes similar to remote abdominal CT Biopsy 08/07/2021 showed  invasive moderately differentiated squamous cell carcinoma Foundation 1-MSS, tumor mutation burden 7, PD-L1 CPS 10 Esophagus radiation 08/13/2021 - 09/03/2021 Cycle 1 FOLFOX/nivolumab 09/08/2021 Cycle 2 held 09/22/2021 due to neutropenia Cycle 2 FOLFOX/nivolumab 09/29/2021, [place]   Solid/liquid dysphagia secondary to #1 -Gastrostomy tube placement 07/31/2021 Weight loss secondary to #1 Odynophagia secondary to #1 History of colon polyps-tubular adenomas History of a diverticular bleed-April 2020 Anemia Thrombocytosis Left subclavian Port-A-Cath placement 08/10/2021 Left upper extremity edema -Doppler ultrasound of left upper extremity 08/13/2021-negative for DVT  Disposition: Leslie Livingston appears unchanged.  He has completed 1 cycle of FOLFOX/nivolumab.  Cycle 2 was held last week due to neutropenia.  CBC from today reviewed.  Counts adequate to proceed with cycle 2.  He will receive white cell growth factor support on the day of pump discontinuation.  Potential side effects again reviewed.  He agrees to proceed.  Chemotherapy doses adjusted due to weight loss.  New prescription for oxycodone sent to his pharmacy.  We are ordering a rolling walker due to generalized weakness/deconditioning related to his medical condition and treatment.  We will return for lab, follow-up, cycle 3 FOLFOX/nivolumab in 2 weeks.    Ned Card ANP/GNP-BC   09/29/2021  11:32 AM

## 2021-09-29 NOTE — Progress Notes (Signed)
Adjusted tx plan doses today d/t decreased weight.  Ned Card, NP ok'd changes.  Kennith Center, Pharm.D., CPP 09/29/2021'@1'$ :18 PM

## 2021-09-29 NOTE — Progress Notes (Signed)
[facility] Work  Initial Assessment   Leslie Livingston is a 71 y.o. year old male met with caregiver. Clinical Social Work was referred by medical provider for assessment of psychosocial needs.   SDOH (Social Determinants of Health) assessments performed: Yes SDOH Interventions    Flowsheet Row Clinical Support from 09/29/2021 in [facility] from 08/27/2021 in [facility] Interventions    Food Insecurity Interventions Intervention Not Indicated --  Housing Interventions Intervention Not Indicated Intervention Not Indicated  Transportation Interventions -- Intervention Not Indicated  Utilities Interventions Intervention Not Indicated --  Alcohol Usage Interventions Intervention Not Indicated (Score <7) --  Depression Interventions/Treatment  Counseling --  Financial Strain Interventions Intervention Not Indicated --  Physical Activity Interventions Intervention Not Indicated --  Stress Interventions Intervention Not Indicated --  Social Connections Interventions Intervention Not Indicated --       SDOH Screenings   Food Insecurity: No Food Insecurity (09/29/2021)  Housing: Low Risk  (09/29/2021)  Transportation Needs: No Transportation Needs (08/27/2021)  Utilities: Not At Risk (09/29/2021)  Alcohol Screen: Low Risk  (09/29/2021)  Depression (PHQ2-9): Medium Risk (09/29/2021)  Financial Resource Strain: Medium Risk (09/29/2021)  Physical Activity: Inactive (09/29/2021)  Social Connections: Socially Integrated (09/29/2021)  Stress: Stress Concern Present (09/29/2021)  Tobacco Use: Medium Risk (09/29/2021)     Distress Screen completed: No     No data to display            Family/Social Information:  Housing Arrangement: patient lives with spouse, patient's daughter Leslie Livingston ([place]) [phone]  is main contact and assists with transportation to appointments. Family members/support persons in your  life? Family, Friends, Social worker, and Geophysical data processor concerns: no  Employment: Retired  .  Income source: Paediatric nurse concerns:  Financial concerns Type of concern: Medical bills and financial of concerns of daily living Food access concerns: no Religious or spiritual practice: Yes-  Services Currently in place:  [employer]  Coping/ Adjustment to diagnosis: Patient understands treatment plan and what happens next? yes Concerns about diagnosis and/or treatment: How I will pay for the services I need Patient reported stressors: Finances - medical bills and treatment Hopes and/or priorities: N/A Patient enjoys time with family/ friends Current coping skills/ strengths: Average or above average intelligence , Capable of independent living , General fund of knowledge , Motivation for treatment/growth , and Supportive family/friends     SUMMARY: Current SDOH Barriers:  Financial constraints related to fixed income and Medication procurement  Clinical Social Work Clinical Goal(s):  Patient will Banker about [employer] assistance.  Interventions: Discussed common feeling and emotions when being diagnosed with cancer, and the importance of support during treatment Informed patient of the support team roles and support services at [facility] Provided CSW contact information and encouraged patient to call with any questions or concerns Referred patient to [facility], Provided patient with information about CSW role in patient care, and other available resources, and DME procurement and financial assistance with medications   Follow Up Plan: Patient will contact CSW with any support or resource needs Patient verbalizes understanding of plan: Yes    Adelene Amas, LCSW

## 2021-09-29 NOTE — Patient Instructions (Signed)
[facility]   Discharge Instructions: Thank you for choosing [facility] to provide your oncology and hematology care.   If you have a lab appointment with the [facility], please go directly to the [facility] and check in at the registration area.   Wear comfortable clothing and clothing appropriate for easy access to any Portacath or PICC line.   We strive to give you quality time with your provider. You may need to reschedule your appointment if you arrive late (15 or more minutes).  Arriving late affects you and other patients whose appointments are after yours.  Also, if you miss three or more appointments without notifying the office, you may be dismissed from the clinic at the provider's discretion.      For prescription refill requests, have your pharmacy contact our office and allow 72 hours for refills to be completed.    Today you received the following chemotherapy and/or immunotherapy agents Nivolumab (OPDIVO), Oxaliplatin (ELOXATIN), Leucovorin & Flourouracil (ADRUCIL).      To help prevent nausea and vomiting after your treatment, we encourage you to take your nausea medication as directed.  BELOW ARE SYMPTOMS THAT SHOULD BE REPORTED IMMEDIATELY: *FEVER GREATER THAN 100.4 F (38 C) OR HIGHER *CHILLS OR SWEATING *NAUSEA AND VOMITING THAT IS NOT CONTROLLED WITH YOUR NAUSEA MEDICATION *UNUSUAL SHORTNESS OF BREATH *UNUSUAL BRUISING OR BLEEDING *URINARY PROBLEMS (pain or burning when urinating, or frequent urination) *BOWEL PROBLEMS (unusual diarrhea, constipation, pain near the anus) TENDERNESS IN MOUTH AND THROAT WITH OR WITHOUT PRESENCE OF ULCERS (sore throat, sores in mouth, or a toothache) UNUSUAL RASH, SWELLING OR PAIN  UNUSUAL VAGINAL DISCHARGE OR ITCHING   Items with * indicate a potential emergency and should be followed up as soon as possible or go to the Emergency Department if any problems should occur.  Please show  the CHEMOTHERAPY ALERT CARD or IMMUNOTHERAPY ALERT CARD at check-in to the Emergency Department and triage nurse.  Should you have questions after your visit or need to cancel or reschedule your appointment, please contact [facility]  Dept: [phone]  and follow the prompts.  Office hours are 8:00 a.m. to 4:30 p.m. Monday - Friday. Please note that voicemails left after 4:00 p.m. may not be returned until the following business day.  We are closed weekends and major holidays. You have access to a nurse at all times for urgent questions. Please call the main number to the clinic Dept: [phone] and follow the prompts.   For any non-urgent questions, you may also contact your provider using MyChart. We now offer e-Visits for anyone 16 and older to request care online for non-urgent symptoms. For details visit [email or web address].   Also download the MyChart app! Go to the app store, search "MyChart", open the app, select [facility], and log in with your MyChart username and password.  Masks are optional in the cancer centers. If you would like for your care team to wear a mask while they are taking care of you, please let them know. You may have one support person who is at least 72 years old accompany you for your appointments.  Nivolumab Injection What is this medication? NIVOLUMAB (nye VOL ue mab) treats some types of cancer. It works by helping your immune system slow or stop the spread of cancer cells. It is a monoclonal antibody. This medicine may be used for other purposes; ask your health care provider or pharmacist if you  have questions. COMMON BRAND NAME(S): Opdivo What should I tell my care team before I take this medication? They need to know if you have any of these conditions: Allogeneic stem cell transplant (uses someone else's stem cells) Autoimmune diseases, such as Crohn disease, ulcerative colitis, lupus History of chest radiation Nervous  system problems, such as Guillain-Barre syndrome or myasthenia gravis Organ transplant An unusual or allergic reaction to nivolumab, other medications, foods, dyes, or preservatives Pregnant or trying to get pregnant Breast-feeding How should I use this medication? This medication is infused into a vein. It is given in a hospital or clinic setting. A special MedGuide will be given to you before each treatment. Be sure to read this information carefully each time. Talk to your care team about the use of this medication in children. While it may be prescribed for children as young as 12 years for selected conditions, precautions do apply. Overdosage: If you think you have taken too much of this medicine contact a poison control center or emergency room at once. NOTE: This medicine is only for you. Do not share this medicine with others. What if I miss a dose? Keep appointments for follow-up doses. It is important not to miss your dose. Call your care team if you are unable to keep an appointment. What may interact with this medication? Interactions have not been studied. This list may not describe all possible interactions. Give your health care provider a list of all the medicines, herbs, non-prescription drugs, or dietary supplements you use. Also tell them if you smoke, drink alcohol, or use illegal drugs. Some items may interact with your medicine. What should I watch for while using this medication? Your condition will be monitored carefully while you are receiving this medication. You may need blood work while taking this medication. This medication may cause serious skin reactions. They can happen weeks to months after starting the medication. Contact your care team right away if you notice fevers or flu-like symptoms with a rash. The rash may be red or purple and then turn into blisters or peeling of the skin. You may also notice a red rash with swelling of the face, lips, or lymph nodes in  your neck or under your arms. Tell your care team right away if you have any change in your eyesight. Talk to your care team if you are pregnant or think you might be pregnant. A negative pregnancy test is required before starting this medication. A reliable form of contraception is recommended while taking this medication and for 5 months after the last dose. Talk to your care team about effective forms of contraception. Do not breast-feed while taking this medication and for 5 months after the last dose. What side effects may I notice from receiving this medication? Side effects that you should report to your care team as soon as possible: Allergic reactions--skin rash, itching, hives, swelling of the face, lips, tongue, or throat Dry cough, shortness of breath or trouble breathing Eye pain, redness, irritation, or discharge with blurry or decreased vision Heart muscle inflammation--unusual weakness or fatigue, shortness of breath, chest pain, fast or irregular heartbeat, dizziness, swelling of the ankles, feet, or hands Hormone gland problems--headache, sensitivity to light, unusual weakness or fatigue, dizziness, fast or irregular heartbeat, increased sensitivity to cold or heat, excessive sweating, constipation, hair loss, increased thirst or amount of urine, tremors or shaking, irritability Infusion reactions--chest pain, shortness of breath or trouble breathing, feeling faint or lightheaded Kidney injury (glomerulonephritis)--decrease in  the amount of urine, red or dark brown urine, foamy or bubbly urine, swelling of the ankles, hands, or feet Liver injury--right upper belly pain, loss of appetite, nausea, light-colored stool, dark yellow or brown urine, yellowing skin or eyes, unusual weakness or fatigue Pain, tingling, or numbness in the hands or feet, muscle weakness, change in vision, confusion or trouble speaking, loss of balance or coordination, trouble walking, seizures Rash, fever, and  swollen lymph nodes Redness, blistering, peeling, or loosening of the skin, including inside the mouth Sudden or severe stomach pain, bloody diarrhea, fever, nausea, vomiting Side effects that usually do not require medical attention (report these to your care team if they continue or are bothersome): Bone, joint, or muscle pain Diarrhea Fatigue Loss of appetite Nausea Skin rash This list may not describe all possible side effects. Call your doctor for medical advice about side effects. You may report side effects to FDA at 1-800-FDA-1088. Where should I keep my medication? This medication is given in a hospital or clinic. It will not be stored at home. NOTE: This sheet is a summary. It may not cover all possible information. If you have questions about this medicine, talk to your doctor, pharmacist, or health care provider.  2023 Elsevier/Gold Standard (2020-12-12 00:00:00)  Oxaliplatin Injection What is this medication? OXALIPLATIN (ox AL i PLA tin) treats some types of cancer. It works by slowing down the growth of cancer cells. This medicine may be used for other purposes; ask your health care provider or pharmacist if you have questions. COMMON BRAND NAME(S): Eloxatin What should I tell my care team before I take this medication? They need to know if you have any of these conditions: Heart disease History of irregular heartbeat or rhythm Liver disease Low blood cell levels (white cells, red cells, and platelets) Lung or breathing disease, such as asthma Take medications that treat or prevent blood clots Tingling of the fingers, toes, or other nerve disorder An unusual or allergic reaction to oxaliplatin, other medications, foods, dyes, or preservatives If you or your partner are pregnant or trying to get pregnant Breast-feeding How should I use this medication? This medication is injected into a vein. It is given by your care team in a hospital or clinic setting. Talk to your  care team about the use of this medication in children. Special care may be needed. Overdosage: If you think you have taken too much of this medicine contact a poison control center or emergency room at once. NOTE: This medicine is only for you. Do not share this medicine with others. What if I miss a dose? Keep appointments for follow-up doses. It is important not to miss a dose. Call your care team if you are unable to keep an appointment. What may interact with this medication? Do not take this medication with any of the following: Cisapride Dronedarone Pimozide Thioridazine This medication may also interact with the following: Aspirin and aspirin-like medications Certain medications that treat or prevent blood clots, such as warfarin, apixaban, dabigatran, and rivaroxaban Cisplatin Cyclosporine Diuretics Medications for infection, such as acyclovir, adefovir, amphotericin B, bacitracin, cidofovir, foscarnet, ganciclovir, gentamicin, pentamidine, vancomycin NSAIDs, medications for pain and inflammation, such as ibuprofen or naproxen Other medications that cause heart rhythm changes Pamidronate Zoledronic acid This list may not describe all possible interactions. Give your health care provider a list of all the medicines, herbs, non-prescription drugs, or dietary supplements you use. Also tell them if you smoke, drink alcohol, or use illegal drugs. Some  items may interact with your medicine. What should I watch for while using this medication? Your condition will be monitored carefully while you are receiving this medication. You may need blood work while taking this medication. This medication may make you feel generally unwell. This is not uncommon as chemotherapy can affect healthy cells as well as cancer cells. Report any side effects. Continue your course of treatment even though you feel ill unless your care team tells you to stop. This medication may increase your risk of getting  an infection. Call your care team for advice if you get a fever, chills, sore throat, or other symptoms of a cold or flu. Do not treat yourself. Try to avoid being around people who are sick. Avoid taking medications that contain aspirin, acetaminophen, ibuprofen, naproxen, or ketoprofen unless instructed by your care team. These medications may hide a fever. Be careful brushing or flossing your teeth or using a toothpick because you may get an infection or bleed more easily. If you have any dental work done, tell your dentist you are receiving this medication. This medication can make you more sensitive to cold. Do not drink cold drinks or use ice. Cover exposed skin before coming in contact with cold temperatures or cold objects. When out in cold weather wear warm clothing and cover your mouth and nose to warm the air that goes into your lungs. Tell your care team if you get sensitive to the cold. Talk to your care team if you or your partner are pregnant or think either of you might be pregnant. This medication can cause serious birth defects if taken during pregnancy and for 9 months after the last dose. A negative pregnancy test is required before starting this medication. A reliable form of contraception is recommended while taking this medication and for 9 months after the last dose. Talk to your care team about effective forms of contraception. Do not father a child while taking this medication and for 6 months after the last dose. Use a condom while having sex during this time period. Do not breastfeed while taking this medication and for 3 months after the last dose. This medication may cause infertility. Talk to your care team if you are concerned about your fertility. What side effects may I notice from receiving this medication? Side effects that you should report to your care team as soon as possible: Allergic reactions--skin rash, itching, hives, swelling of the face, lips, tongue, or  throat Bleeding--bloody or black, tar-like stools, vomiting blood or brown material that looks like coffee grounds, red or dark brown urine, small red or purple spots on skin, unusual bruising or bleeding Dry cough, shortness of breath or trouble breathing Heart rhythm changes--fast or irregular heartbeat, dizziness, feeling faint or lightheaded, chest pain, trouble breathing Infection--fever, chills, cough, sore throat, wounds that don't heal, pain or trouble when passing urine, general feeling of discomfort or being unwell Liver injury--right upper belly pain, loss of appetite, nausea, light-colored stool, dark yellow or brown urine, yellowing skin or eyes, unusual weakness or fatigue Low red blood cell level--unusual weakness or fatigue, dizziness, headache, trouble breathing Muscle injury--unusual weakness or fatigue, muscle pain, dark yellow or brown urine, decrease in amount of urine Pain, tingling, or numbness in the hands or feet Sudden and severe headache, confusion, change in vision, seizures, which may be signs of posterior reversible encephalopathy syndrome (PRES) Unusual bruising or bleeding Side effects that usually do not require medical attention (report to your care team  if they continue or are bothersome): Diarrhea Nausea Pain, redness, or swelling with sores inside the mouth or throat Unusual weakness or fatigue Vomiting This list may not describe all possible side effects. Call your doctor for medical advice about side effects. You may report side effects to FDA at 1-800-FDA-1088. Where should I keep my medication? This medication is given in a hospital or clinic. It will not be stored at home. NOTE: This sheet is a summary. It may not cover all possible information. If you have questions about this medicine, talk to your doctor, pharmacist, or health care provider.  2023 Elsevier/Gold Standard (2021-05-08 00:00:00)  Leucovorin Injection What is this  medication? LEUCOVORIN (loo koe VOR in) prevents side effects from certain medications, such as methotrexate. It works by increasing folate levels. This helps protect healthy cells in your body. It may also be used to treat anemia caused by low levels of folate. It can also be used with fluorouracil, a type of chemotherapy, to treat colorectal cancer. It works by increasing the effects of fluorouracil in the body. This medicine may be used for other purposes; ask your health care provider or pharmacist if you have questions. What should I tell my care team before I take this medication? They need to know if you have any of these conditions: Anemia from low levels of vitamin B12 in the blood An unusual or allergic reaction to leucovorin, folic acid, other medications, foods, dyes, or preservatives Pregnant or trying to get pregnant Breastfeeding How should I use this medication? This medication is injected into a vein or a muscle. It is given by your care team in a hospital or clinic setting. Talk to your care team about the use of this medication in children. Special care may be needed. Overdosage: If you think you have taken too much of this medicine contact a poison control center or emergency room at once. NOTE: This medicine is only for you. Do not share this medicine with others. What if I miss a dose? Keep appointments for follow-up doses. It is important not to miss your dose. Call your care team if you are unable to keep an appointment. What may interact with this medication? Capecitabine Fluorouracil Phenobarbital Phenytoin Primidone Trimethoprim;sulfamethoxazole This list may not describe all possible interactions. Give your health care provider a list of all the medicines, herbs, non-prescription drugs, or dietary supplements you use. Also tell them if you smoke, drink alcohol, or use illegal drugs. Some items may interact with your medicine. What should I watch for while using  this medication? Your condition will be monitored carefully while you are receiving this medication. This medication may increase the side effects of 5-fluorouracil. Tell your care team if you have diarrhea or mouth sores that do not get better or that get worse. What side effects may I notice from receiving this medication? Side effects that you should report to your care team as soon as possible: Allergic reactions--skin rash, itching, hives, swelling of the face, lips, tongue, or throat This list may not describe all possible side effects. Call your doctor for medical advice about side effects. You may report side effects to FDA at 1-800-FDA-1088. Where should I keep my medication? This medication is given in a hospital or clinic. It will not be stored at home. NOTE: This sheet is a summary. It may not cover all possible information. If you have questions about this medicine, talk to your doctor, pharmacist, or health care provider.  2023 Elsevier/Gold Standard (  2021-05-22 00:00:00)  Fluorouracil Injection What is this medication? FLUOROURACIL (flure oh YOOR a sil) treats some types of cancer. It works by slowing down the growth of cancer cells. This medicine may be used for other purposes; ask your health care provider or pharmacist if you have questions. COMMON BRAND NAME(S): Adrucil What should I tell my care team before I take this medication? They need to know if you have any of these conditions: Blood disorders Dihydropyrimidine dehydrogenase (DPD) deficiency Infection, such as chickenpox, cold sores, herpes Kidney disease Liver disease Poor nutrition Recent or ongoing radiation therapy An unusual or allergic reaction to fluorouracil, other medications, foods, dyes, or preservatives If you or your partner are pregnant or trying to get pregnant Breast-feeding How should I use this medication? This medication is injected into a vein. It is administered by your care team in a  hospital or clinic setting. Talk to your care team about the use of this medication in children. Special care may be needed. Overdosage: If you think you have taken too much of this medicine contact a poison control center or emergency room at once. NOTE: This medicine is only for you. Do not share this medicine with others. What if I miss a dose? Keep appointments for follow-up doses. It is important not to miss your dose. Call your care team if you are unable to keep an appointment. What may interact with this medication? Do not take this medication with any of the following: Live virus vaccines This medication may also interact with the following: Medications that treat or prevent blood clots, such as warfarin, enoxaparin, dalteparin This list may not describe all possible interactions. Give your health care provider a list of all the medicines, herbs, non-prescription drugs, or dietary supplements you use. Also tell them if you smoke, drink alcohol, or use illegal drugs. Some items may interact with your medicine. What should I watch for while using this medication? Your condition will be monitored carefully while you are receiving this medication. This medication may make you feel generally unwell. This is not uncommon as chemotherapy can affect healthy cells as well as cancer cells. Report any side effects. Continue your course of treatment even though you feel ill unless your care team tells you to stop. In some cases, you may be given additional medications to help with side effects. Follow all directions for their use. This medication may increase your risk of getting an infection. Call your care team for advice if you get a fever, chills, sore throat, or other symptoms of a cold or flu. Do not treat yourself. Try to avoid being around people who are sick. This medication may increase your risk to bruise or bleed. Call your care team if you notice any unusual bleeding. Be careful brushing  or flossing your teeth or using a toothpick because you may get an infection or bleed more easily. If you have any dental work done, tell your dentist you are receiving this medication. Avoid taking medications that contain aspirin, acetaminophen, ibuprofen, naproxen, or ketoprofen unless instructed by your care team. These medications may hide a fever. Do not treat diarrhea with over the counter products. Contact your care team if you have diarrhea that lasts more than 2 days or if it is severe and watery. This medication can make you more sensitive to the sun. Keep out of the sun. If you cannot avoid being in the sun, wear protective clothing and sunscreen. Do not use sun lamps, tanning beds, or  tanning booths. Talk to your care team if you or your partner wish to become pregnant or think you might be pregnant. This medication can cause serious birth defects if taken during pregnancy and for 3 months after the last dose. A reliable form of contraception is recommended while taking this medication and for 3 months after the last dose. Talk to your care team about effective forms of contraception. Do not father a child while taking this medication and for 3 months after the last dose. Use a condom while having sex during this time period. Do not breastfeed while taking this medication. This medication may cause infertility. Talk to your care team if you are concerned about your fertility. What side effects may I notice from receiving this medication? Side effects that you should report to your care team as soon as possible: Allergic reactions--skin rash, itching, hives, swelling of the face, lips, tongue, or throat Heart attack--pain or tightness in the chest, shoulders, arms, or jaw, nausea, shortness of breath, cold or clammy skin, feeling faint or lightheaded Heart failure--shortness of breath, swelling of the ankles, feet, or hands, sudden weight gain, unusual weakness or fatigue Heart rhythm  changes--fast or irregular heartbeat, dizziness, feeling faint or lightheaded, chest pain, trouble breathing High ammonia level--unusual weakness or fatigue, confusion, loss of appetite, nausea, vomiting, seizures Infection--fever, chills, cough, sore throat, wounds that don't heal, pain or trouble when passing urine, general feeling of discomfort or being unwell Low red blood cell level--unusual weakness or fatigue, dizziness, headache, trouble breathing Pain, tingling, or numbness in the hands or feet, muscle weakness, change in vision, confusion or trouble speaking, loss of balance or coordination, trouble walking, seizures Redness, swelling, and blistering of the skin over hands and feet Severe or prolonged diarrhea Unusual bruising or bleeding Side effects that usually do not require medical attention (report to your care team if they continue or are bothersome): Dry skin Headache Increased tears Nausea Pain, redness, or swelling with sores inside the mouth or throat Sensitivity to light Vomiting This list may not describe all possible side effects. Call your doctor for medical advice about side effects. You may report side effects to FDA at 1-800-FDA-1088. Where should I keep my medication? This medication is given in a hospital or clinic. It will not be stored at home. NOTE: This sheet is a summary. It may not cover all possible information. If you have questions about this medicine, talk to your doctor, pharmacist, or health care provider.  2023 Elsevier/Gold Standard (2021-05-19 00:00:00)  The chemotherapy medication bag should finish at 46 hours, 96 hours, or 7 days. For example, if your pump is scheduled for 46 hours and it was put on at 4:00 p.m., it should finish at 2:00 p.m. the day it is scheduled to come off regardless of your appointment time.     Estimated time to finish at 3:00 p.m. on Thursday 10/01/2021.   If the display on your pump reads "Low Volume" and it is  beeping, take the batteries out of the pump and come to the cancer center for it to be taken off.   If the pump alarms go off prior to the pump reading "Low Volume" then call [phone] and someone can assist you.  If the plunger comes out and the chemotherapy medication is leaking out, please use your home chemo spill kit to clean up the spill. Do NOT use paper towels or other household products.  If you have problems or questions regarding your pump,  please call either [phone] (24 hours a day) or the cancer center Monday-Friday 8:00 a.m.- 4:30 p.m. at the clinic number and we will assist you. If you are unable to get assistance, then go to the nearest Emergency Department and ask the staff to contact the IV team for assistance.

## 2021-09-30 ENCOUNTER — Other Ambulatory Visit: Payer: Self-pay

## 2021-10-01 ENCOUNTER — Inpatient Hospital Stay: Payer: Medicare PPO

## 2021-10-01 ENCOUNTER — Other Ambulatory Visit: Payer: Self-pay

## 2021-10-01 ENCOUNTER — Telehealth: Payer: Self-pay | Admitting: Emergency Medicine

## 2021-10-01 VITALS — BP 109/66 | HR 92 | Temp 98.2°F | Resp 20

## 2021-10-01 DIAGNOSIS — Z5111 Encounter for antineoplastic chemotherapy: Secondary | ICD-10-CM | POA: Diagnosis not present

## 2021-10-01 DIAGNOSIS — C159 Malignant neoplasm of esophagus, unspecified: Secondary | ICD-10-CM

## 2021-10-01 MED ORDER — SODIUM CHLORIDE 0.9% FLUSH
10.0000 mL | INTRAVENOUS | Status: DC | PRN
  Administered 2021-10-01: 10 mL

## 2021-10-01 MED ORDER — HEPARIN SOD (PORK) LOCK FLUSH 100 UNIT/ML IV SOLN
500.0000 [IU] | Freq: Once | INTRAVENOUS | Status: AC | PRN
  Administered 2021-10-01: 500 [IU]

## 2021-10-01 MED ORDER — PEGFILGRASTIM-CBQV 6 MG/0.6ML ~~LOC~~ SOSY
6.0000 mg | PREFILLED_SYRINGE | Freq: Once | SUBCUTANEOUS | Status: AC
  Administered 2021-10-01: 6 mg via SUBCUTANEOUS
  Filled 2021-10-01: qty 0.6

## 2021-10-01 NOTE — Patient Instructions (Signed)
Implanted Port Home Guide An implanted port is a device that is placed under the skin. It is usually placed in the chest. The device may vary based on the need. Implanted ports can be used to give IV medicine, to take blood, or to give fluids. You may have an implanted port if: You need IV medicine that would be irritating to the small veins in your hands or arms. You need IV medicines, such as chemotherapy, for a long period of time. You need IV nutrition for a long period of time. You may have fewer limitations when using a port than you would if you used other types of long-term IVs. You will also likely be able to return to normal activities after your incision heals. An implanted port has two main parts: Reservoir. The reservoir is the part where a needle is inserted to give medicines or draw blood. The reservoir is round. After the port is placed, it appears as a small, raised area under your skin. Catheter. The catheter is a small, thin tube that connects the reservoir to a vein. Medicine that is inserted into the reservoir goes into the catheter and then into the vein. How is my port accessed? To access your port: A numbing cream may be placed on the skin over the port site. Your health care provider will put on a mask and sterile gloves. The skin over your port will be cleaned carefully with a germ-killing soap and allowed to dry. Your health care provider will gently pinch the port and insert a needle into it. Your health care provider will check for a blood return to make sure the port is in the vein and is still working (patent). If your port needs to remain accessed to get medicine continuously (constant infusion), your health care provider will place a clear bandage (dressing) over the needle site. The dressing and needle will need to be changed every week, or as told by your health care provider. What is flushing? Flushing helps keep the port working. Follow instructions from your  health care provider about how and when to flush the port. Ports are usually flushed with saline solution or a medicine called heparin. The need for flushing will depend on how the port is used: If the port is only used from time to time to give medicines or draw blood, the port may need to be flushed: Before and after medicines have been given. Before and after blood has been drawn. As part of routine maintenance. Flushing may be recommended every 4-6 weeks. If a constant infusion is running, the port may not need to be flushed. Throw away any syringes in a disposal container that is meant for sharp items (sharps container). You can buy a sharps container from a pharmacy, or you can make one by using an empty hard plastic bottle with a cover. How long will my port stay implanted? The port can stay in for as long as your health care provider thinks it is needed. When it is time for the port to come out, a surgery will be done to remove it. The surgery will be similar to the procedure that was done to put the port in. Follow these instructions at home: Caring for your port and port site Flush your port as told by your health care provider. If you need an infusion over several days, follow instructions from your health care provider about how to take care of your port site. Make sure you: Change your   dressing as told by your health care provider. Wash your hands with soap and water for at least 20 seconds before and after you change your dressing. If soap and water are not available, use alcohol-based hand sanitizer. Place any used dressings or infusion bags into a plastic bag. Throw that bag in the trash. Keep the dressing that covers the needle clean and dry. Do not get it wet. Do not use scissors or sharp objects near the infusion tubing. Keep any external tubes clamped, unless they are being used. Check your port site every day for signs of infection. Check for: Redness, swelling, or  pain. Fluid or blood. Warmth. Pus or a bad smell. Protect the skin around the port site. Avoid wearing bra straps that rub or irritate the site. Protect the skin around your port from seat belts. Place a soft pad over your chest if needed. Bathe or shower as told by your health care provider. The site may get wet as long as you are not actively receiving an infusion. General instructions  Return to your normal activities as told by your health care provider. Ask your health care provider what activities are safe for you. Carry a medical alert card or wear a medical alert bracelet at all times. This will let health care providers know that you have an implanted port in case of an emergency. Where to find more information American Cancer Society: www.cancer.org American Society of Clinical Oncology: www.cancer.net Contact a health care provider if: You have a fever or chills. You have redness, swelling, or pain at the port site. You have fluid or blood coming from your port site. Your incision feels warm to the touch. You have pus or a bad smell coming from the port site. Summary Implanted ports are usually placed in the chest for long-term IV access. Follow instructions from your health care provider about flushing the port and changing bandages (dressings). Take care of the area around your port by avoiding clothing that puts pressure on the area, and by watching for signs of infection. Protect the skin around your port from seat belts. Place a soft pad over your chest if needed. Contact a health care provider if you have a fever or you have redness, swelling, pain, fluid, or a bad smell at the port site. This information is not intended to replace advice given to you by your health care provider. Make sure you discuss any questions you have with your health care provider. Document Revised: 07/15/2020 Document Reviewed: 07/15/2020 Elsevier Patient Education  2023 Elsevier  Inc.  Pegfilgrastim Injection What is this medication? PEGFILGRASTIM (PEG fil gra stim) lowers the risk of infection in people who are receiving chemotherapy. It works by helping your body make more white blood cells, which protects your body from infection. It may also be used to help people who have been exposed to high doses of radiation. This medicine may be used for other purposes; ask your health care provider or pharmacist if you have questions. COMMON BRAND NAME(S): Fulphila, Fylnetra, Neulasta, Nyvepria, Stimufend, UDENYCA, Ziextenzo What should I tell my care team before I take this medication? They need to know if you have any of these conditions: Kidney disease Latex allergy Ongoing radiation therapy Sickle cell disease Skin reactions to acrylic adhesives (On-Body Injector only) An unusual or allergic reaction to pegfilgrastim, filgrastim, other medications, foods, dyes, or preservatives Pregnant or trying to get pregnant Breast-feeding How should I use this medication? This medication is for injection under the   skin. If you get this medication at home, you will be taught how to prepare and give the pre-filled syringe or how to use the On-body Injector. Refer to the patient Instructions for Use for detailed instructions. Use exactly as directed. Tell your care team immediately if you suspect that the On-body Injector may not have performed as intended or if you suspect the use of the On-body Injector resulted in a missed or partial dose. It is important that you put your used needles and syringes in a special sharps container. Do not put them in a trash can. If you do not have a sharps container, call your pharmacist or care team to get one. Talk to your care team about the use of this medication in children. While this medication may be prescribed for selected conditions, precautions do apply. Overdosage: If you think you have taken too much of this medicine contact a poison control  center or emergency room at once. NOTE: This medicine is only for you. Do not share this medicine with others. What if I miss a dose? It is important not to miss your dose. Call your care team if you miss your dose. If you miss a dose due to an On-body Injector failure or leakage, a new dose should be administered as soon as possible using a single prefilled syringe for manual use. What may interact with this medication? Interactions have not been studied. This list may not describe all possible interactions. Give your health care provider a list of all the medicines, herbs, non-prescription drugs, or dietary supplements you use. Also tell them if you smoke, drink alcohol, or use illegal drugs. Some items may interact with your medicine. What should I watch for while using this medication? Your condition will be monitored carefully while you are receiving this medication. You may need blood work done while you are taking this medication. Talk to your care team about your risk of cancer. You may be more at risk for certain types of cancer if you take this medication. If you are going to need a MRI, CT scan, or other procedure, tell your care team that you are using this medication (On-Body Injector only). What side effects may I notice from receiving this medication? Side effects that you should report to your care team as soon as possible: Allergic reactions--skin rash, itching, hives, swelling of the face, lips, tongue, or throat Capillary leak syndrome--stomach or muscle pain, unusual weakness or fatigue, feeling faint or lightheaded, decrease in the amount of urine, swelling of the ankles, hands, or feet, trouble breathing High white blood cell level--fever, fatigue, trouble breathing, night sweats, change in vision, weight loss Inflammation of the aorta--fever, fatigue, back, chest, or stomach pain, severe headache Kidney injury (glomerulonephritis)--decrease in the amount of urine, red or dark  brown urine, foamy or bubbly urine, swelling of the ankles, hands, or feet Shortness of breath or trouble breathing Spleen injury--pain in upper left stomach or shoulder Unusual bruising or bleeding Side effects that usually do not require medical attention (report to your care team if they continue or are bothersome): Bone pain Pain in the hands or feet This list may not describe all possible side effects. Call your doctor for medical advice about side effects. You may report side effects to FDA at 1-800-FDA-1088. Where should I keep my medication? Keep out of the reach of children. If you are using this medication at home, you will be instructed on how to store it. Throw away any unused   medication after the expiration date on the label. NOTE: This sheet is a summary. It may not cover all possible information. If you have questions about this medicine, talk to your doctor, pharmacist, or health care provider.  2023 Elsevier/Gold Standard (2013-04-13 00:00:00)

## 2021-10-01 NOTE — Telephone Encounter (Signed)
Pt was scheduled for a 3:00 pump stop appointment. At 3;30 patient hasn't arrived. Called and spoke to [place] pt daughter. She stated she forgot about the appointment. She will have to leave work, go get him and bring him in.

## 2021-10-06 ENCOUNTER — Inpatient Hospital Stay: Payer: Medicare PPO

## 2021-10-06 ENCOUNTER — Inpatient Hospital Stay: Payer: Medicare PPO | Admitting: Nurse Practitioner

## 2021-10-06 ENCOUNTER — Other Ambulatory Visit: Payer: Self-pay | Admitting: Nurse Practitioner

## 2021-10-06 ENCOUNTER — Telehealth: Payer: Self-pay | Admitting: *Deleted

## 2021-10-06 ENCOUNTER — Other Ambulatory Visit (HOSPITAL_BASED_OUTPATIENT_CLINIC_OR_DEPARTMENT_OTHER): Payer: Self-pay

## 2021-10-06 DIAGNOSIS — C153 Malignant neoplasm of upper third of esophagus: Secondary | ICD-10-CM

## 2021-10-06 MED ORDER — OXYCODONE HCL 5 MG/5ML PO SOLN
5.0000 mg | Freq: Four times a day (QID) | ORAL | 0 refills | Status: DC | PRN
  Filled 2021-10-06: qty 200, 5d supply, fill #0

## 2021-10-06 NOTE — Telephone Encounter (Signed)
Daughter called to request refill on pain med-only has one dose on hand and for script with office note be faxed to [facility] at [phone] for the rolling walker he needs.  Script sent in per NP and walker order/note faxed as requested. Informed daughter to try to provide 48 hour notice for refills.

## 2021-10-08 ENCOUNTER — Inpatient Hospital Stay: Payer: Medicare PPO

## 2021-10-08 ENCOUNTER — Other Ambulatory Visit: Payer: Self-pay | Admitting: Oncology

## 2021-10-08 NOTE — Progress Notes (Signed)
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  The patient was notified in advance and gave permission to proceed with this visit format.  He tolerated radiation treatment relatively well with some complaints of persistent dysphagia with thickened saliva and coughing up sputum but denied hemoptysis.  He remained on G-tube feedings throughout treatment.                             On review of systems, the patient states ***  ALLERGIES:  has No Known Allergies.  Meds: Current Outpatient Medications  Medication Sig Dispense Refill   amLODipine (NORVASC) 5 MG tablet Take 1 tablet (5 mg total) by mouth daily. 30 tablet 0   fluconazole (DIFLUCAN) 10 MG/ML suspension Take 10 ml (100 mg total)via G tube daily for 5 days. 50 mL 0   lidocaine-prilocaine (EMLA) cream Apply 1 Application topically as needed. 30 g 0   lisinopril (ZESTRIL) 20 MG tablet Take by mouth.     Nutritional Supplements (FEEDING SUPPLEMENT, OSMOLITE 1.5 CAL,) LIQD Place 1,000 mLs into feeding tube continuous. Rate is 55 cc/hr. 1000 mL 0   Nutritional Supplements (FEEDING SUPPLEMENT, OSMOLITE 1.5 CAL,) LIQD Place 1,000 mLs into feeding tube continuous. Rate: 55 cc/hr. 1000 mL 0   Nutritional Supplements (FEEDING SUPPLEMENT, PROSOURCE TF,) liquid Place 45 mLs into feeding tube 3 (three) times daily. Rate is 55 cc  /hr. 45 mL 0   ondansetron (ZOFRAN) 8 MG tablet Take 1 tablet (8 mg total) by mouth every 8 (eight) hours as needed (Starting day 4 after chemo as needed for nausea). 20 tablet 0   oxyCODONE (ROXICODONE) 5 MG/5ML solution Place 5-10 mLs (5-10 mg total) into feeding tube every 6 (six) hours as needed for severe pain. 200 mL 0   pantoprazole sodium (PROTONIX) 40 mg Place 1 packet (40 mg) into feeding tube daily. 30 packet 0   prochlorperazine (COMPAZINE) 10 MG tablet Take 1 tablet (10 mg total) by mouth every 6 (six) hours as needed for nausea or vomiting. 30 tablet 0   promethazine (PHENERGAN) 6.25 MG/5ML syrup Take 12.5 via G-Tube every 6 hours as needed for nausea and vomiting. 120 mL 0   Water For Irrigation, Sterile (FREE WATER) SOLN Place 150 mLs into feeding tube every 4 (four) hours. Rate is 55 cc/ hr. 150 mL 0   No current facility-administered medications for this visit.    Physical Findings:  vitals were not taken for this visit.   /10 Unable to assess due to telephone follow-up visit format.  Lab Findings: Lab Results  Component Value Date   WBC 7.6 09/29/2021   HGB 10.2 (L) 09/29/2021   HCT 32.6 (L) 09/29/2021   MCV 86.2 09/29/2021   PLT 452 (H) 09/29/2021     Radiographic Findings: DG Chest  2 View  Result Date: 09/23/2021 CLINICAL DATA:  Cough EXAM: CHEST - 2 VIEW COMPARISON:  Chest x-ray dated September 18, 2021 FINDINGS: Cardiac and mediastinal contours are unchanged. Left chest wall port unchanged in position. Bilateral pulmonary nodules, some of which are cavitary, unchanged when compared with the prior exam. Left basilar atelectasis. No large pleural effusion or pneumothorax. IMPRESSION: Bilateral solid pulmonary nodules, some of which are cavitary, unchanged when compared with prior exam, compatible with metastatic disease. No new airspace opacity. Electronically Signed   By: Yetta Glassman M.D.   On: 09/23/2021 15:49   CT Angio Chest PE W and/or Wo Contrast  Result  Date: 09/18/2021 CLINICAL DATA:  Pulmonary embolism.  Cough and shortness of breath. EXAM: CT ANGIOGRAPHY CHEST WITH CONTRAST TECHNIQUE: Multidetector CT imaging of the chest was performed using the standard protocol during bolus administration of intravenous contrast. Multiplanar CT image reconstructions and MIPs were obtained to evaluate the vascular anatomy. RADIATION DOSE REDUCTION: This exam was performed according to the departmental dose-optimization program which includes automated exposure control, adjustment of the mA and/or kV according to patient size and/or use of iterative reconstruction technique. CONTRAST:  50m OMNIPAQUE IOHEXOL 350 MG/ML SOLN COMPARISON:  08/07/2021 FINDINGS: Cardiovascular: Heart size is normal. There are coronary artery calcifications. The pulmonary arteries are well opacified by the contrast bolus, and there is no acute pulmonary embolus. There is atherosclerotic calcification of the thoracic aorta not associated with aneurysm. Mediastinum/Nodes: Similar-appearing enlarged lymph nodes in the LOWER neck. The visualized portion of the thyroid gland has a normal appearance. Enlarged nodes are identified in the subcarinal, RIGHT paratracheal, and hilar regions and appears similar to prior. Lungs/Pleura: There has been improvement in pleural effusions. Numerous solid and cavitary lesions are identified throughout the lungs, as seen on multiple prior studies. Nodules have a similar distribution compared to prior study and measure up to 2.7 centimeters. Multiple nodules show progression from solid to cavitary lesions. Overall size and number is stable however. Upper Abdomen: Gallbladder is present. Small periaortic lymph node at the level of the celiac axis is 9 millimeters. Musculoskeletal: Moderate degenerative changes throughout the thoracic spine. Lytic lesion along the posterior vertebral body at T5 now measures 1.6 centimeters, previously 1.4 centimeters. A new lytic lesion is  identified at T6 along the posterior vertebral body measures 0.7 centimeters. There is a new metastasis within the LEFT 10th rib, associated with acute pathologic fracture possibly accounting for the patient's pain. Review of the MIP images confirms the above findings. IMPRESSION: 1. Technically adequate exam showing no acute pulmonary embolus. 2. Interval progression of osseous metastatic disease. Increased size of T5 vertebral body lesion. New T6 vertebral body lesion. 3. Pathologic fracture of the LEFT 10th rib secondary to metastasis which is new since the prior study. This may account for the patient's pain. 4. Persistent numerous pulmonary nodules, with multiple nodules progressing to cavitary morphology. 5. Similar supraclavicular, mediastinal, and hilar adenopathy. Electronically Signed   By: ENolon NationsM.D.   On: 09/18/2021 18:46   DG Chest 2 View  Result Date: 09/18/2021 CLINICAL DATA:  Productive cough and shortness of breath for a few days EXAM: CHEST - 2 VIEW COMPARISON:  Portable exam 1304 hours compared to 06/10/2021 Correlation CT chest 08/07/2021 FINDINGS: RIGHT subclavian Port-A-Cath with tip projecting over SVC. Normal heart size, mediastinal contours, and pulmonary vascularity. Atherosclerotic calcification aorta. BILATERAL pulmonary nodules consistent with pulmonary metastases. Cavitary nodule in the mid RIGHT lung 2.3 x 2.0 cm. No pleural effusion or  pneumothorax. No acute osseous findings. IMPRESSION: Pulmonary metastases including a cavitary lesion 2.3 x 2.0 cm in the RIGHT mid lung, new since the prior chest radiograph 06/10/2021; patient did however have RIGHT mid lung nodules on an interval CT chest though the cavitation is new. Electronically Signed   By: Lavonia Dana M.D.   On: 09/18/2021 13:19   IR REPLACE GASTRO/JEJUNO TUBE PERC W/FLUORO  Result Date: 09/16/2021 INDICATION: Damaged balloon retention gastrostomy, esophageal carcinoma EXAM: FLUOROSCOPIC EXCHANGE OF THE 22  FRENCH BALLOON RETENTION GASTROSTOMY MEDICATIONS: NONE. ANESTHESIA/SEDATION: None. CONTRAST:  10 cc-administered into the gastric lumen. FLUOROSCOPY: Radiation Exposure Index (as provided by the fluoroscopic device): 1.0 mGy COMPLICATIONS: None immediate. PROCEDURE: Informed written consent was obtained from the patient after a thorough discussion of the procedural risks, benefits and alternatives. All questions were addressed. Maximal Sterile Barrier Technique was utilized including caps, mask, sterile gowns, sterile gloves, sterile drape, hand hygiene and skin antiseptic. A timeout was performed prior to the initiation of the procedure. Existing gastrostomy was successfully exchanged through the percutaneous tract. 40 French balloon retention gastrostomy advanced. Retention balloon inflated with 8 cc saline and retracted against anterior gastric wall. Fluoroscopic injection confirms position in the stomach. Catheter secured externally. Feeding tube ready for use. IMPRESSION: Successful fluoroscopic exchange of the 22 French balloon retention gastrostomy. Ready for use. Electronically Signed   By: Jerilynn Mages.  Shick M.D.   On: 09/16/2021 14:03    Impression/Plan: 1. 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  He has had some gradual improvement in the dysphagia but has continued with tube feedings.  He continues to tolerate the systemic therapy fairly well and is scheduled for cycle 3 FOLFOX/nivolumab later this morning.  We discussed that while we are happy to continue to participate in his care clinically indicated, at this point, we will plan to see him back on an as-needed basis.  He will continue in routine follow-up under the care and direction of Dr. Benay Spice for continued management of his systemic disease but knows that he is welcome to  call anytime with any questions related to his previous radiation.  We enjoyed taking care of him and look forward to continuing to follow his progress via correspondence.    Nicholos Johns, PA-C

## 2021-10-12 ENCOUNTER — Telehealth: Payer: Self-pay

## 2021-10-12 ENCOUNTER — Encounter: Payer: Self-pay | Admitting: Urology

## 2021-10-12 NOTE — Progress Notes (Signed)
Telephone appointment. I spoke w/ patient's daughter Ms. [name], verified her identity and began nursing interview. She reports patient complains of overall joint pain 7/10. No other issues reported at this time.  Meaningful use complete.  Reminded daughter of patient's 8:30am-10/13/21 telephone appointment w/ Ashlyn Bruning PA-C. I left my extension [phone] in case patient needs anything. Ms. Nori Riis verbalized understanding.  Patient contact [phone] or Daughter Ms. Nori Riis [phone]

## 2021-10-12 NOTE — Telephone Encounter (Signed)
Appointment reminder left via voicemail regarding patient's 8:30am-10/13/21 telephone appointment w/ Ashlyn Bruning PA-C. I left my extension [phone] and requested that patient return my call, so that I may complete the nursing portion of patient's appointment.

## 2021-10-13 ENCOUNTER — Other Ambulatory Visit (HOSPITAL_BASED_OUTPATIENT_CLINIC_OR_DEPARTMENT_OTHER): Payer: Self-pay

## 2021-10-13 ENCOUNTER — Encounter: Payer: Self-pay | Admitting: Oncology

## 2021-10-13 ENCOUNTER — Other Ambulatory Visit: Payer: Self-pay | Admitting: Oncology

## 2021-10-13 ENCOUNTER — Inpatient Hospital Stay: Payer: Medicare PPO | Admitting: Oncology

## 2021-10-13 ENCOUNTER — Inpatient Hospital Stay: Payer: Medicare PPO

## 2021-10-13 ENCOUNTER — Ambulatory Visit
Admission: RE | Admit: 2021-10-13 | Discharge: 2021-10-13 | Disposition: A | Discharge status: Home / Self Care | Payer: Medicare PPO | Source: Ambulatory Visit | Attending: Urology | Admitting: Urology

## 2021-10-13 ENCOUNTER — Other Ambulatory Visit: Payer: Self-pay | Admitting: *Deleted

## 2021-10-13 VITALS — BP 102/73 | HR 100 | Temp 98.1°F | Resp 18 | Ht 73.0 in | Wt 193.2 lb

## 2021-10-13 VITALS — BP 105/67 | HR 80 | Temp 98.6°F | Resp 16

## 2021-10-13 DIAGNOSIS — C159 Malignant neoplasm of esophagus, unspecified: Secondary | ICD-10-CM

## 2021-10-13 DIAGNOSIS — Z5111 Encounter for antineoplastic chemotherapy: Secondary | ICD-10-CM | POA: Diagnosis not present

## 2021-10-13 DIAGNOSIS — C153 Malignant neoplasm of upper third of esophagus: Secondary | ICD-10-CM | POA: Insufficient documentation

## 2021-10-13 DIAGNOSIS — Z95828 Presence of other vascular implants and grafts: Secondary | ICD-10-CM

## 2021-10-13 LAB — CMP (CANCER CENTER ONLY)
ALT: 18 U/L (ref 0–44)
AST: 19 U/L (ref 15–41)
Albumin: 3.5 g/dL (ref 3.5–5.0)
Alkaline Phosphatase: 110 U/L (ref 38–126)
Anion gap: 11 (ref 5–15)
BUN: 23 mg/dL (ref 8–23)
CO2: 27 mmol/L (ref 22–32)
Calcium: 10.2 mg/dL (ref 8.9–10.3)
Chloride: 96 mmol/L — ABNORMAL LOW (ref 98–111)
Creatinine: 0.9 mg/dL (ref 0.61–1.24)
GFR, Estimated: >60 mL/min (ref >60)
Glucose, Bld: 140 mg/dL — ABNORMAL HIGH (ref 70–99)
Potassium: 3.8 mmol/L (ref 3.5–5.1)
Sodium: 134 mmol/L — ABNORMAL LOW (ref 135–145)
Total Bilirubin: 0.3 mg/dL (ref 0.3–1.2)
Total Protein: 8.2 g/dL — ABNORMAL HIGH (ref 6.5–8.1)

## 2021-10-13 LAB — CBC WITH DIFFERENTIAL (CANCER CENTER ONLY)
Abs Immature Granulocytes: 0.08 10*3/uL — ABNORMAL HIGH (ref 0.00–0.07)
Basophils Absolute: 0.1 10*3/uL (ref 0.0–0.1)
Basophils Relative: 1 %
Eosinophils Absolute: 0.3 10*3/uL (ref 0.0–0.5)
Eosinophils Relative: 4 %
HCT: 34.6 % — ABNORMAL LOW (ref 39.0–52.0)
Hemoglobin: 10.8 g/dL — ABNORMAL LOW (ref 13.0–17.0)
Immature Granulocytes: 1 %
Lymphocytes Relative: 13 %
Lymphs Abs: 1.1 10*3/uL (ref 0.7–4.0)
MCH: 27.3 pg (ref 26.0–34.0)
MCHC: 31.2 g/dL (ref 30.0–36.0)
MCV: 87.4 fL (ref 80.0–100.0)
Monocytes Absolute: 1.1 10*3/uL — ABNORMAL HIGH (ref 0.1–1.0)
Monocytes Relative: 13 %
Neutro Abs: 5.9 10*3/uL (ref 1.7–7.7)
Neutrophils Relative %: 68 %
Platelet Count: 366 10*3/uL (ref 150–400)
RBC: 3.96 MIL/uL — ABNORMAL LOW (ref 4.22–5.81)
RDW: 16.5 % — ABNORMAL HIGH (ref 11.5–15.5)
WBC Count: 8.5 10*3/uL (ref 4.0–10.5)
nRBC: 0 % (ref 0.0–0.2)

## 2021-10-13 MED ORDER — PALONOSETRON HCL INJECTION 0.25 MG/5ML
0.2500 mg | Freq: Once | INTRAVENOUS | Status: AC
  Administered 2021-10-13: 0.25 mg via INTRAVENOUS
  Filled 2021-10-13: qty 5

## 2021-10-13 MED ORDER — SODIUM CHLORIDE 0.9 % IV SOLN
10.0000 mg | Freq: Once | INTRAVENOUS | Status: AC
  Administered 2021-10-13: 10 mg via INTRAVENOUS
  Filled 2021-10-13: qty 10

## 2021-10-13 MED ORDER — OXALIPLATIN CHEMO INJECTION 100 MG/20ML
85.0000 mg/m2 | Freq: Once | INTRAVENOUS | Status: AC
  Administered 2021-10-13: 175 mg via INTRAVENOUS
  Filled 2021-10-13: qty 35

## 2021-10-13 MED ORDER — FLUOROURACIL CHEMO INJECTION 2.5 GM/50ML
400.0000 mg/m2 | Freq: Once | INTRAVENOUS | Status: AC
  Administered 2021-10-13: 800 mg via INTRAVENOUS
  Filled 2021-10-13: qty 16

## 2021-10-13 MED ORDER — ONDANSETRON HCL 8 MG PO TABS
8.0000 mg | ORAL_TABLET | Freq: Three times a day (TID) | ORAL | 2 refills | Status: DC | PRN
  Filled 2021-10-13: qty 30, 10d supply, fill #0
  Filled 2021-12-29 (×2): qty 30, 10d supply, fill #1

## 2021-10-13 MED ORDER — DEXTROSE 5 % IV SOLN: Freq: Once | INTRAVENOUS | Status: AC

## 2021-10-13 MED ORDER — PANTOPRAZOLE SODIUM 40 MG PO PACK
40.0000 mg | PACK | Freq: Every day | ORAL | 2 refills | Status: DC
  Filled 2021-10-13: qty 30, 30d supply, fill #0

## 2021-10-13 MED ORDER — LEUCOVORIN CALCIUM INJECTION 350 MG
400.0000 mg/m2 | Freq: Once | INTRAVENOUS | Status: AC
  Administered 2021-10-13: 812 mg via INTRAVENOUS
  Filled 2021-10-13: qty 40.6

## 2021-10-13 MED ORDER — SODIUM CHLORIDE 0.9 % IV SOLN
240.0000 mg | Freq: Once | INTRAVENOUS | Status: AC
  Administered 2021-10-13: 240 mg via INTRAVENOUS
  Filled 2021-10-13: qty 24

## 2021-10-13 MED ORDER — OXYCODONE HCL 5 MG/5ML PO SOLN
5.0000 mg | Freq: Four times a day (QID) | ORAL | 0 refills | Status: DC | PRN
  Filled 2021-10-13: qty 200, 5d supply, fill #0

## 2021-10-13 MED ORDER — ALTEPLASE 2 MG IJ SOLR
2.0000 mg | Freq: Once | INTRAMUSCULAR | Status: AC
  Administered 2021-10-13: 2 mg
  Filled 2021-10-13: qty 2

## 2021-10-13 MED ORDER — PROMETHAZINE HCL 6.25 MG/5ML PO SYRP
ORAL_SOLUTION | ORAL | 2 refills | Status: DC
  Filled 2021-10-13: qty 120, 3d supply, fill #0
  Filled 2021-12-29: qty 120, 3d supply, fill #1

## 2021-10-13 MED ORDER — SODIUM CHLORIDE 0.9% FLUSH: 10.0000 mL | INTRAVENOUS | Status: DC | PRN

## 2021-10-13 MED ORDER — HEPARIN SOD (PORK) LOCK FLUSH 100 UNIT/ML IV SOLN: 500.0000 [IU] | Freq: Once | INTRAVENOUS | Status: DC | PRN

## 2021-10-13 MED ORDER — SODIUM CHLORIDE 0.9 % IV SOLN
2000.0000 mg/m2 | INTRAVENOUS | Status: DC
  Administered 2021-10-13: 4050 mg via INTRAVENOUS
  Filled 2021-10-13: qty 81

## 2021-10-13 NOTE — Progress Notes (Signed)
Pt returned from MD appointment and this RN flushed port to start IVF for treatment. This RN accessed pt port and had great blood return noted for lab draw. Once patient returned; no blood return noted after multiple flushes and position changes. Marita Kansas RN at chairside to attempt to get blood return. After multiple flushes and position changes still unable to get blood return noted from port. This RN deaccessed patient and re-accessed with no blood return noted. No swelling or pain noted from port and flushed without difficulty. Dr. Benay Spice aware and ok to give pre meds through port and then TPA prior to chemotherapy.

## 2021-10-13 NOTE — Patient Instructions (Signed)

## 2021-10-13 NOTE — Progress Notes (Signed)
Patient seen by Dr. Sherrill today ? ?Vitals are within treatment parameters. ? ?Labs reviewed by Dr. Sherrill and are within treatment parameters. ? ?Per physician team, patient is ready for treatment and there are NO modifications to the treatment plan.  ?

## 2021-10-13 NOTE — Patient Instructions (Addendum)
Leslie Livingston   Discharge Instructions: Thank you for choosing [facility] to provide your oncology and hematology care.   If you have a lab appointment with the [facility], please go directly to the [facility] and check in at the registration area.   Wear comfortable clothing and clothing appropriate for easy access to any Portacath or PICC line.   We strive to give you quality time with your provider. You may need to reschedule your appointment if you arrive late (15 or more minutes).  Arriving late affects you and other patients whose appointments are after yours.  Also, if you miss three or more appointments without notifying the office, you may be dismissed from the clinic at the provider's discretion.      For prescription refill requests, have your pharmacy contact our office and allow 72 hours for refills to be completed.    Today you received the following chemotherapy and/or immunotherapy agents Nivolumab (OPDIVO), Oxaliplatin (ELOXATIN), Leucovorin & Flourouracil (ADRUCIL).      To help prevent nausea and vomiting after your treatment, we encourage you to take your nausea medication as directed.  BELOW ARE SYMPTOMS THAT SHOULD BE REPORTED IMMEDIATELY: *FEVER GREATER THAN 100.4 F (38 C) OR HIGHER *CHILLS OR SWEATING *NAUSEA AND VOMITING THAT IS NOT CONTROLLED WITH YOUR NAUSEA MEDICATION *UNUSUAL SHORTNESS OF BREATH *UNUSUAL BRUISING OR BLEEDING *URINARY PROBLEMS (pain or burning when urinating, or frequent urination) *BOWEL PROBLEMS (unusual diarrhea, constipation, pain near the anus) TENDERNESS IN MOUTH AND THROAT WITH OR WITHOUT PRESENCE OF ULCERS (sore throat, sores in mouth, or a toothache) UNUSUAL RASH, SWELLING OR PAIN  UNUSUAL VAGINAL DISCHARGE OR ITCHING   Items with * indicate a potential emergency and should be followed up as soon as possible or go to the Emergency Department if any problems should occur.  Please show  the CHEMOTHERAPY ALERT CARD or IMMUNOTHERAPY ALERT CARD at check-in to the Emergency Department and triage nurse.  Should you have questions after your visit or need to cancel or reschedule your appointment, please contact [facility]  Dept: [phone]  and follow the prompts.  Office hours are 8:00 a.m. to 4:30 p.m. Monday - Friday. Please note that voicemails left after 4:00 p.m. may not be returned until the following business day.  We are closed weekends and major holidays. You have access to a nurse at all times for urgent questions. Please call the main number to the clinic Dept: [phone] and follow the prompts.   For any non-urgent questions, you may also contact your provider using MyChart. We now offer e-Visits for anyone 16 and older to request care online for non-urgent symptoms. For details visit [email or web address].   Also download the MyChart app! Go to the app store, search "MyChart", open the app, select [facility], and log in with your MyChart username and password.  Masks are optional in the cancer centers. If you would like for your care team to wear a mask while they are taking care of you, please let them know. You may have one support person who is at least 72 years old accompany you for your appointments.  Nivolumab Injection What is this medication? NIVOLUMAB (nye VOL ue mab) treats some types of cancer. It works by helping your immune system slow or stop the spread of cancer cells. It is a monoclonal antibody. This medicine may be used for other purposes; ask your health care provider or pharmacist if you  have questions. COMMON BRAND NAME(S): Opdivo What should I tell my care team before I take this medication? They need to know if you have any of these conditions: Allogeneic stem cell transplant (uses someone else's stem cells) Autoimmune diseases, such as Crohn disease, ulcerative colitis, lupus History of chest radiation Nervous  system problems, such as Guillain-Barre syndrome or myasthenia gravis Organ transplant An unusual or allergic reaction to nivolumab, other medications, foods, dyes, or preservatives Pregnant or trying to get pregnant Breast-feeding How should I use this medication? This medication is infused into a vein. It is given in a hospital or clinic setting. A special MedGuide will be given to you before each treatment. Be sure to read this information carefully each time. Talk to your care team about the use of this medication in children. While it may be prescribed for children as young as 12 years for selected conditions, precautions do apply. Overdosage: If you think you have taken too much of this medicine contact a poison control center or emergency room at once. NOTE: This medicine is only for you. Do not share this medicine with others. What if I miss a dose? Keep appointments for follow-up doses. It is important not to miss your dose. Call your care team if you are unable to keep an appointment. What may interact with this medication? Interactions have not been studied. This list may not describe all possible interactions. Give your health care provider a list of all the medicines, herbs, non-prescription drugs, or dietary supplements you use. Also tell them if you smoke, drink alcohol, or use illegal drugs. Some items may interact with your medicine. What should I watch for while using this medication? Your condition will be monitored carefully while you are receiving this medication. You may need blood work while taking this medication. This medication may cause serious skin reactions. They can happen weeks to months after starting the medication. Contact your care team right away if you notice fevers or flu-like symptoms with a rash. The rash may be red or purple and then turn into blisters or peeling of the skin. You may also notice a red rash with swelling of the face, lips, or lymph nodes in  your neck or under your arms. Tell your care team right away if you have any change in your eyesight. Talk to your care team if you are pregnant or think you might be pregnant. A negative pregnancy test is required before starting this medication. A reliable form of contraception is recommended while taking this medication and for 5 months after the last dose. Talk to your care team about effective forms of contraception. Do not breast-feed while taking this medication and for 5 months after the last dose. What side effects may I notice from receiving this medication? Side effects that you should report to your care team as soon as possible: Allergic reactions--skin rash, itching, hives, swelling of the face, lips, tongue, or throat Dry cough, shortness of breath or trouble breathing Eye pain, redness, irritation, or discharge with blurry or decreased vision Heart muscle inflammation--unusual weakness or fatigue, shortness of breath, chest pain, fast or irregular heartbeat, dizziness, swelling of the ankles, feet, or hands Hormone gland problems--headache, sensitivity to light, unusual weakness or fatigue, dizziness, fast or irregular heartbeat, increased sensitivity to cold or heat, excessive sweating, constipation, hair loss, increased thirst or amount of urine, tremors or shaking, irritability Infusion reactions--chest pain, shortness of breath or trouble breathing, feeling faint or lightheaded Kidney injury (glomerulonephritis)--decrease in  the amount of urine, red or dark brown urine, foamy or bubbly urine, swelling of the ankles, hands, or feet Liver injury--right upper belly pain, loss of appetite, nausea, light-colored stool, dark yellow or brown urine, yellowing skin or eyes, unusual weakness or fatigue Pain, tingling, or numbness in the hands or feet, muscle weakness, change in vision, confusion or trouble speaking, loss of balance or coordination, trouble walking, seizures Rash, fever, and  swollen lymph nodes Redness, blistering, peeling, or loosening of the skin, including inside the mouth Sudden or severe stomach pain, bloody diarrhea, fever, nausea, vomiting Side effects that usually do not require medical attention (report these to your care team if they continue or are bothersome): Bone, joint, or muscle pain Diarrhea Fatigue Loss of appetite Nausea Skin rash This list may not describe all possible side effects. Call your doctor for medical advice about side effects. You may report side effects to FDA at 1-800-FDA-1088. Where should I keep my medication? This medication is given in a hospital or clinic. It will not be stored at home. NOTE: This sheet is a summary. It may not cover all possible information. If you have questions about this medicine, talk to your doctor, pharmacist, or health care provider.  2023 Elsevier/Gold Standard (2020-12-12 00:00:00)  Oxaliplatin Injection What is this medication? OXALIPLATIN (ox AL i PLA tin) treats some types of cancer. It works by slowing down the growth of cancer cells. This medicine may be used for other purposes; ask your health care provider or pharmacist if you have questions. COMMON BRAND NAME(S): Eloxatin What should I tell my care team before I take this medication? They need to know if you have any of these conditions: Heart disease History of irregular heartbeat or rhythm Liver disease Low blood cell levels (white cells, red cells, and platelets) Lung or breathing disease, such as asthma Take medications that treat or prevent blood clots Tingling of the fingers, toes, or other nerve disorder An unusual or allergic reaction to oxaliplatin, other medications, foods, dyes, or preservatives If you or your partner are pregnant or trying to get pregnant Breast-feeding How should I use this medication? This medication is injected into a vein. It is given by your care team in a hospital or clinic setting. Talk to your  care team about the use of this medication in children. Special care may be needed. Overdosage: If you think you have taken too much of this medicine contact a poison control center or emergency room at once. NOTE: This medicine is only for you. Do not share this medicine with others. What if I miss a dose? Keep appointments for follow-up doses. It is important not to miss a dose. Call your care team if you are unable to keep an appointment. What may interact with this medication? Do not take this medication with any of the following: Cisapride Dronedarone Pimozide Thioridazine This medication may also interact with the following: Aspirin and aspirin-like medications Certain medications that treat or prevent blood clots, such as warfarin, apixaban, dabigatran, and rivaroxaban Cisplatin Cyclosporine Diuretics Medications for infection, such as acyclovir, adefovir, amphotericin B, bacitracin, cidofovir, foscarnet, ganciclovir, gentamicin, pentamidine, vancomycin NSAIDs, medications for pain and inflammation, such as ibuprofen or naproxen Other medications that cause heart rhythm changes Pamidronate Zoledronic acid This list may not describe all possible interactions. Give your health care provider a list of all the medicines, herbs, non-prescription drugs, or dietary supplements you use. Also tell them if you smoke, drink alcohol, or use illegal drugs. Some  items may interact with your medicine. What should I watch for while using this medication? Your condition will be monitored carefully while you are receiving this medication. You may need blood work while taking this medication. This medication may make you feel generally unwell. This is not uncommon as chemotherapy can affect healthy cells as well as cancer cells. Report any side effects. Continue your course of treatment even though you feel ill unless your care team tells you to stop. This medication may increase your risk of getting  an infection. Call your care team for advice if you get a fever, chills, sore throat, or other symptoms of a cold or flu. Do not treat yourself. Try to avoid being around people who are sick. Avoid taking medications that contain aspirin, acetaminophen, ibuprofen, naproxen, or ketoprofen unless instructed by your care team. These medications may hide a fever. Be careful brushing or flossing your teeth or using a toothpick because you may get an infection or bleed more easily. If you have any dental work done, tell your dentist you are receiving this medication. This medication can make you more sensitive to cold. Do not drink cold drinks or use ice. Cover exposed skin before coming in contact with cold temperatures or cold objects. When out in cold weather wear warm clothing and cover your mouth and nose to warm the air that goes into your lungs. Tell your care team if you get sensitive to the cold. Talk to your care team if you or your partner are pregnant or think either of you might be pregnant. This medication can cause serious birth defects if taken during pregnancy and for 9 months after the last dose. A negative pregnancy test is required before starting this medication. A reliable form of contraception is recommended while taking this medication and for 9 months after the last dose. Talk to your care team about effective forms of contraception. Do not father a child while taking this medication and for 6 months after the last dose. Use a condom while having sex during this time period. Do not breastfeed while taking this medication and for 3 months after the last dose. This medication may cause infertility. Talk to your care team if you are concerned about your fertility. What side effects may I notice from receiving this medication? Side effects that you should report to your care team as soon as possible: Allergic reactions--skin rash, itching, hives, swelling of the face, lips, tongue, or  throat Bleeding--bloody or black, tar-like stools, vomiting blood or brown material that looks like coffee grounds, red or dark brown urine, small red or purple spots on skin, unusual bruising or bleeding Dry cough, shortness of breath or trouble breathing Heart rhythm changes--fast or irregular heartbeat, dizziness, feeling faint or lightheaded, chest pain, trouble breathing Infection--fever, chills, cough, sore throat, wounds that don't heal, pain or trouble when passing urine, general feeling of discomfort or being unwell Liver injury--right upper belly pain, loss of appetite, nausea, light-colored stool, dark yellow or brown urine, yellowing skin or eyes, unusual weakness or fatigue Low red blood cell level--unusual weakness or fatigue, dizziness, headache, trouble breathing Muscle injury--unusual weakness or fatigue, muscle pain, dark yellow or brown urine, decrease in amount of urine Pain, tingling, or numbness in the hands or feet Sudden and severe headache, confusion, change in vision, seizures, which may be signs of posterior reversible encephalopathy syndrome (PRES) Unusual bruising or bleeding Side effects that usually do not require medical attention (report to your care team  if they continue or are bothersome): Diarrhea Nausea Pain, redness, or swelling with sores inside the mouth or throat Unusual weakness or fatigue Vomiting This list may not describe all possible side effects. Call your doctor for medical advice about side effects. You may report side effects to FDA at 1-800-FDA-1088. Where should I keep my medication? This medication is given in a hospital or clinic. It will not be stored at home. NOTE: This sheet is a summary. It may not cover all possible information. If you have questions about this medicine, talk to your doctor, pharmacist, or health care provider.  2023 Elsevier/Gold Standard (2021-05-08 00:00:00)  Leucovorin Injection What is this  medication? LEUCOVORIN (loo koe VOR in) prevents side effects from certain medications, such as methotrexate. It works by increasing folate levels. This helps protect healthy cells in your body. It may also be used to treat anemia caused by low levels of folate. It can also be used with fluorouracil, a type of chemotherapy, to treat colorectal cancer. It works by increasing the effects of fluorouracil in the body. This medicine may be used for other purposes; ask your health care provider or pharmacist if you have questions. What should I tell my care team before I take this medication? They need to know if you have any of these conditions: Anemia from low levels of vitamin B12 in the blood An unusual or allergic reaction to leucovorin, folic acid, other medications, foods, dyes, or preservatives Pregnant or trying to get pregnant Breastfeeding How should I use this medication? This medication is injected into a vein or a muscle. It is given by your care team in a hospital or clinic setting. Talk to your care team about the use of this medication in children. Special care may be needed. Overdosage: If you think you have taken too much of this medicine contact a poison control center or emergency room at once. NOTE: This medicine is only for you. Do not share this medicine with others. What if I miss a dose? Keep appointments for follow-up doses. It is important not to miss your dose. Call your care team if you are unable to keep an appointment. What may interact with this medication? Capecitabine Fluorouracil Phenobarbital Phenytoin Primidone Trimethoprim;sulfamethoxazole This list may not describe all possible interactions. Give your health care provider a list of all the medicines, herbs, non-prescription drugs, or dietary supplements you use. Also tell them if you smoke, drink alcohol, or use illegal drugs. Some items may interact with your medicine. What should I watch for while using  this medication? Your condition will be monitored carefully while you are receiving this medication. This medication may increase the side effects of 5-fluorouracil. Tell your care team if you have diarrhea or mouth sores that do not get better or that get worse. What side effects may I notice from receiving this medication? Side effects that you should report to your care team as soon as possible: Allergic reactions--skin rash, itching, hives, swelling of the face, lips, tongue, or throat This list may not describe all possible side effects. Call your doctor for medical advice about side effects. You may report side effects to FDA at 1-800-FDA-1088. Where should I keep my medication? This medication is given in a hospital or clinic. It will not be stored at home. NOTE: This sheet is a summary. It may not cover all possible information. If you have questions about this medicine, talk to your doctor, pharmacist, or health care provider.  2023 Elsevier/Gold Standard (  2021-05-22 00:00:00)  Fluorouracil Injection What is this medication? FLUOROURACIL (flure oh YOOR a sil) treats some types of cancer. It works by slowing down the growth of cancer cells. This medicine may be used for other purposes; ask your health care provider or pharmacist if you have questions. COMMON BRAND NAME(S): Adrucil What should I tell my care team before I take this medication? They need to know if you have any of these conditions: Blood disorders Dihydropyrimidine dehydrogenase (DPD) deficiency Infection, such as chickenpox, cold sores, herpes Kidney disease Liver disease Poor nutrition Recent or ongoing radiation therapy An unusual or allergic reaction to fluorouracil, other medications, foods, dyes, or preservatives If you or your partner are pregnant or trying to get pregnant Breast-feeding How should I use this medication? This medication is injected into a vein. It is administered by your care team in a  hospital or clinic setting. Talk to your care team about the use of this medication in children. Special care may be needed. Overdosage: If you think you have taken too much of this medicine contact a poison control center or emergency room at once. NOTE: This medicine is only for you. Do not share this medicine with others. What if I miss a dose? Keep appointments for follow-up doses. It is important not to miss your dose. Call your care team if you are unable to keep an appointment. What may interact with this medication? Do not take this medication with any of the following: Live virus vaccines This medication may also interact with the following: Medications that treat or prevent blood clots, such as warfarin, enoxaparin, dalteparin This list may not describe all possible interactions. Give your health care provider a list of all the medicines, herbs, non-prescription drugs, or dietary supplements you use. Also tell them if you smoke, drink alcohol, or use illegal drugs. Some items may interact with your medicine. What should I watch for while using this medication? Your condition will be monitored carefully while you are receiving this medication. This medication may make you feel generally unwell. This is not uncommon as chemotherapy can affect healthy cells as well as cancer cells. Report any side effects. Continue your course of treatment even though you feel ill unless your care team tells you to stop. In some cases, you may be given additional medications to help with side effects. Follow all directions for their use. This medication may increase your risk of getting an infection. Call your care team for advice if you get a fever, chills, sore throat, or other symptoms of a cold or flu. Do not treat yourself. Try to avoid being around people who are sick. This medication may increase your risk to bruise or bleed. Call your care team if you notice any unusual bleeding. Be careful brushing  or flossing your teeth or using a toothpick because you may get an infection or bleed more easily. If you have any dental work done, tell your dentist you are receiving this medication. Avoid taking medications that contain aspirin, acetaminophen, ibuprofen, naproxen, or ketoprofen unless instructed by your care team. These medications may hide a fever. Do not treat diarrhea with over the counter products. Contact your care team if you have diarrhea that lasts more than 2 days or if it is severe and watery. This medication can make you more sensitive to the sun. Keep out of the sun. If you cannot avoid being in the sun, wear protective clothing and sunscreen. Do not use sun lamps, tanning beds, or  tanning booths. Talk to your care team if you or your partner wish to become pregnant or think you might be pregnant. This medication can cause serious birth defects if taken during pregnancy and for 3 months after the last dose. A reliable form of contraception is recommended while taking this medication and for 3 months after the last dose. Talk to your care team about effective forms of contraception. Do not father a child while taking this medication and for 3 months after the last dose. Use a condom while having sex during this time period. Do not breastfeed while taking this medication. This medication may cause infertility. Talk to your care team if you are concerned about your fertility. What side effects may I notice from receiving this medication? Side effects that you should report to your care team as soon as possible: Allergic reactions--skin rash, itching, hives, swelling of the face, lips, tongue, or throat Heart attack--pain or tightness in the chest, shoulders, arms, or jaw, nausea, shortness of breath, cold or clammy skin, feeling faint or lightheaded Heart failure--shortness of breath, swelling of the ankles, feet, or hands, sudden weight gain, unusual weakness or fatigue Heart rhythm  changes--fast or irregular heartbeat, dizziness, feeling faint or lightheaded, chest pain, trouble breathing High ammonia level--unusual weakness or fatigue, confusion, loss of appetite, nausea, vomiting, seizures Infection--fever, chills, cough, sore throat, wounds that don't heal, pain or trouble when passing urine, general feeling of discomfort or being unwell Low red blood cell level--unusual weakness or fatigue, dizziness, headache, trouble breathing Pain, tingling, or numbness in the hands or feet, muscle weakness, change in vision, confusion or trouble speaking, loss of balance or coordination, trouble walking, seizures Redness, swelling, and blistering of the skin over hands and feet Severe or prolonged diarrhea Unusual bruising or bleeding Side effects that usually do not require medical attention (report to your care team if they continue or are bothersome): Dry skin Headache Increased tears Nausea Pain, redness, or swelling with sores inside the mouth or throat Sensitivity to light Vomiting This list may not describe all possible side effects. Call your doctor for medical advice about side effects. You may report side effects to FDA at 1-800-FDA-1088. Where should I keep my medication? This medication is given in a hospital or clinic. It will not be stored at home. NOTE: This sheet is a summary. It may not cover all possible information. If you have questions about this medicine, talk to your doctor, pharmacist, or health care provider.  2023 Elsevier/Gold Standard (2021-05-19 00:00:00)  The chemotherapy medication bag should finish at 46 hours, 96 hours, or 7 days. For example, if your pump is scheduled for 46 hours and it was put on at 4:00 p.m., it should finish at 2:00 p.m. the day it is scheduled to come off regardless of your appointment time.     Estimated time to finish at 2:00 p.m. on Thursday 10/15/2021.   If the display on your pump reads "Low Volume" and it is  beeping, take the batteries out of the pump and come to the cancer center for it to be taken off.   If the pump alarms go off prior to the pump reading "Low Volume" then call [phone] and someone can assist you.  If the plunger comes out and the chemotherapy medication is leaking out, please use your home chemo spill kit to clean up the spill. Do NOT use paper towels or other household products.  If you have problems or questions regarding your pump,  please call either [phone] (24 hours a day) or the cancer center Monday-Friday 8:00 a.m.- 4:30 p.m. at the clinic number and we will assist you. If you are unable to get assistance, then go to the nearest Emergency Department and ask the staff to contact the IV team for assistance.

## 2021-10-13 NOTE — Progress Notes (Signed)
  [facility] OFFICE PROGRESS NOTE   Diagnosis: Esophagus cancer  INTERVAL HISTORY:   Mr Iiams returns as scheduled.  He completed another cycle of FOLFOX/nivolumab on 09/29/2021.  No nausea/vomiting, diarrhea, neuropathy symptoms, or rash.  The skin is diffusely dry.  He has persistent back pain.  The pain is relieved with oxycodone.  He is tolerating liquids by mouth.  He continues tube feedings.  Mr. Silvera reports improvement in his energy level.  He is ambulatory in the home.  He is participating in home physical therapy.  Objective:  Vital signs in last 24 hours:  Blood pressure 102/73, pulse 100, temperature 98.1 F (36.7 C), temperature source Oral, resp. rate 18, height _0  (1.854 m), weight 193 lb 3.2 oz (87.6 kg), SpO2 100 %.    HEENT: No thrush or ulcers Resp: Lungs with bilateral end inspiratory/expiratory rhonchi, no respiratory distress Cardio: Regular rate and rhythm GI: No hepatosplenomegaly, left upper quadrant feeding tube Vascular: No leg edema  Skin: Dryness with flaking over the trunk and extremities  Portacath/PICC-without erythema  Lab Results:  Lab Results  Component Value Date   WBC 8.5 10/13/2021   HGB 10.8 (L) 10/13/2021   HCT 34.6 (L) 10/13/2021   MCV 87.4 10/13/2021   PLT 366 10/13/2021   NEUTROABS 5.9 10/13/2021    CMP  Lab Results  Component Value Date   NA 135 09/29/2021   K 3.8 09/29/2021   CL 98 09/29/2021   CO2 27 09/29/2021   GLUCOSE 117 (H) 09/29/2021   BUN 24 (H) 09/29/2021   CREATININE 0.83 09/29/2021   CALCIUM 9.9 09/29/2021   PROT 8.9 (H) 09/29/2021   ALBUMIN 3.5 09/29/2021   AST 15 09/29/2021   ALT 17 09/29/2021   ALKPHOS 95 09/29/2021   BILITOT 0.4 09/29/2021   GFRNONAA >60 09/29/2021   GFRAA >60 05/06/2018    Medications: I have reviewed the patient's current medications.   Assessment/Plan: Metastatic esophageal cancer CT neck 07/29/2021-adenopathy at the thoracic inlet and lower bilateral  jugular chains, esophagus mass, left vocal cord paralysis CT chest 07/29/2021-diffuse esophageal wall thickening, most pronounced in the proximal esophagus, multiple pulmonary nodules, mediastinal and supraclavicular lymphadenopathy Upper endoscopy 08/07/2021-mass/stricture extending from the UES to 30 cm CTs chest, abdomen, and pelvis 08/07/2021-long segment wall thickening of the esophagus, prominent proximally, multiple large supraclavicular mediastinal nodes, bilateral lung nodules,, and upper abdominal nodes similar to remote abdominal CT Biopsy 08/07/2021 showed invasive moderately differentiated squamous cell carcinoma Foundation 1-MSS, tumor mutation burden 7, PD-L1 CPS 10 Esophagus radiation 08/13/2021 - 09/03/2021 Cycle 1 FOLFOX/nivolumab 09/08/2021 Cycle 2 held 09/22/2021 due to neutropenia Cycle 2 FOLFOX/nivolumab 09/29/2021, Udenyca Cycle 3 FOLFOX/nivolumab 10/13/2021, Udenyca   Solid/liquid dysphagia secondary to #1 -Gastrostomy tube placement 07/31/2021 Weight loss secondary to #1 Odynophagia secondary to #1 History of colon polyps-tubular adenomas History of a diverticular bleed-April 2020 Anemia Thrombocytosis Left subclavian Port-A-Cath placement 08/10/2021 Left upper extremity edema -Doppler ultrasound of left upper extremity 08/13/2021-negative for DVT    Disposition: Mr. Zenon has completed 2 cycles of FOLFOX/nivolumab.  He has tolerated the treatment well.  His performance status has improved.  He will complete cycle 3 today.  He will try nutrition supplements by mouth.  Mr. Turgeon will return for an office visit and chemotherapy in 2 weeks.  He will be referred for a restaging CT evaluation after cycle 5 FOLFOX/nivolumab.  Betsy Coder, MD  10/13/2021  10:06 AM

## 2021-10-14 ENCOUNTER — Ambulatory Visit: Payer: Self-pay | Admitting: Urology

## 2021-10-14 ENCOUNTER — Other Ambulatory Visit: Payer: Self-pay

## 2021-10-14 ENCOUNTER — Ambulatory Visit: Payer: Medicare PPO | Admitting: Dietician

## 2021-10-14 NOTE — Progress Notes (Signed)
Nutrition Follow-up:  Spoke with patient daughter [name] at her home telephone number.  She reports patient is doing well with his feeds, tolerating bolus feeds 4 times a day with 1.5 cartons per feed.  She reports overall getting 3 bottles of water/day via PEG and all meds going through tube.   Daughter is aware MD suggest trial of ONS by mouth.  She said patient has expressed to her he isn't ready but she's willing to encourage him.  She states he is having lots of secretions and trouble clearing, with coughing.  He is rinsing mouth frequently during the day to help clear secretions.  Daughter states patient comfortable with maintaining current weight.    Medications: reviewed  Labs: 10/13/21 HGB 10.8 rising  Anthropometrics:  Weight up 3# past 2 weeks with fluctuations   Height: 6'1" Weight:  10/13/24  193.2 09/23/21  190.7# 09/08/24  196.7# UBW: 225 lb (May 2023) BMI: 25.96     Estimated Energy Needs   Kcals: 2500-2679 Protein: 116-134 Fluid: >/= 2.2 L   Osmolite 1.5 - give 6 and 1/2 cartons split over 4 feedings day. Flush tube with 120 ml before and after bolus. Give 45 ml Prosource TF with 30 ml water TID. This provides 2428 kcal, 130 grams protein, 2227 ml total water with flushes   NUTRITION DIAGNOSIS: Inadequate oral intake related to dysphagia secondary to esophageal cancer as evidenced PEG for nutrition/hydration needs     MALNUTRITION DIAGNOSIS: Suspect moderate malnutrition ongoing      INTERVENTION:  Reviewed bolus feeding with daughter and progression of oral trial.  Encouraged continuation of current bolus feeds.  As patient able to take ONS by mouth we can reduce TF.  Monitor for tolerance, choking, or coughing.    MONITORING, EVALUATION, GOAL: Pt will tolerate tube feedings via bolus      Next Visit: Will f/u via telephone with daughter  next month after MD   April Manson, RDN, LDN Registered Dietitian, [place] (Usual  office hours: Tuesday-Thursday) Mobile: [phone] Remote Office: [phone]

## 2021-10-15 ENCOUNTER — Inpatient Hospital Stay: Payer: Medicare PPO

## 2021-10-15 ENCOUNTER — Other Ambulatory Visit (HOSPITAL_BASED_OUTPATIENT_CLINIC_OR_DEPARTMENT_OTHER): Payer: Self-pay

## 2021-10-15 ENCOUNTER — Encounter: Payer: Self-pay | Admitting: Oncology

## 2021-10-15 VITALS — BP 96/73 | HR 93 | Temp 98.8°F | Resp 16

## 2021-10-15 DIAGNOSIS — Z5111 Encounter for antineoplastic chemotherapy: Secondary | ICD-10-CM | POA: Diagnosis not present

## 2021-10-15 DIAGNOSIS — C159 Malignant neoplasm of esophagus, unspecified: Secondary | ICD-10-CM

## 2021-10-15 MED ORDER — HEPARIN SOD (PORK) LOCK FLUSH 100 UNIT/ML IV SOLN
500.0000 [IU] | Freq: Once | INTRAVENOUS | Status: AC | PRN
  Administered 2021-10-15: 500 [IU]

## 2021-10-15 MED ORDER — SODIUM CHLORIDE 0.9% FLUSH
10.0000 mL | INTRAVENOUS | Status: DC | PRN
  Administered 2021-10-15: 10 mL

## 2021-10-15 MED ORDER — PEGFILGRASTIM-CBQV 6 MG/0.6ML ~~LOC~~ SOSY
6.0000 mg | PREFILLED_SYRINGE | Freq: Once | SUBCUTANEOUS | Status: AC
  Administered 2021-10-15: 6 mg via SUBCUTANEOUS
  Filled 2021-10-15: qty 0.6

## 2021-10-15 NOTE — Patient Instructions (Signed)
Implanted Port Home Guide An implanted port is a device that is placed under the skin. It is usually placed in the chest. The device may vary based on the need. Implanted ports can be used to give IV medicine, to take blood, or to give fluids. You may have an implanted port if: You need IV medicine that would be irritating to the small veins in your hands or arms. You need IV medicines, such as chemotherapy, for a long period of time. You need IV nutrition for a long period of time. You may have fewer limitations when using a port than you would if you used other types of long-term IVs. You will also likely be able to return to normal activities after your incision heals. An implanted port has two main parts: Reservoir. The reservoir is the part where a needle is inserted to give medicines or draw blood. The reservoir is round. After the port is placed, it appears as a small, raised area under your skin. Catheter. The catheter is a small, thin tube that connects the reservoir to a vein. Medicine that is inserted into the reservoir goes into the catheter and then into the vein. How is my port accessed? To access your port: A numbing cream may be placed on the skin over the port site. Your health care provider will put on a mask and sterile gloves. The skin over your port will be cleaned carefully with a germ-killing soap and allowed to dry. Your health care provider will gently pinch the port and insert a needle into it. Your health care provider will check for a blood return to make sure the port is in the vein and is still working (patent). If your port needs to remain accessed to get medicine continuously (constant infusion), your health care provider will place a clear bandage (dressing) over the needle site. The dressing and needle will need to be changed every week, or as told by your health care provider. What is flushing? Flushing helps keep the port working. Follow instructions from your  health care provider about how and when to flush the port. Ports are usually flushed with saline solution or a medicine called heparin. The need for flushing will depend on how the port is used: If the port is only used from time to time to give medicines or draw blood, the port may need to be flushed: Before and after medicines have been given. Before and after blood has been drawn. As part of routine maintenance. Flushing may be recommended every 4-6 weeks. If a constant infusion is running, the port may not need to be flushed. Throw away any syringes in a disposal container that is meant for sharp items (sharps container). You can buy a sharps container from a pharmacy, or you can make one by using an empty hard plastic bottle with a cover. How long will my port stay implanted? The port can stay in for as long as your health care provider thinks it is needed. When it is time for the port to come out, a surgery will be done to remove it. The surgery will be similar to the procedure that was done to put the port in. Follow these instructions at home: Caring for your port and port site Flush your port as told by your health care provider. If you need an infusion over several days, follow instructions from your health care provider about how to take care of your port site. Make sure you: Change your   dressing as told by your health care provider. Wash your hands with soap and water for at least 20 seconds before and after you change your dressing. If soap and water are not available, use alcohol-based hand sanitizer. Place any used dressings or infusion bags into a plastic bag. Throw that bag in the trash. Keep the dressing that covers the needle clean and dry. Do not get it wet. Do not use scissors or sharp objects near the infusion tubing. Keep any external tubes clamped, unless they are being used. Check your port site every day for signs of infection. Check for: Redness, swelling, or  pain. Fluid or blood. Warmth. Pus or a bad smell. Protect the skin around the port site. Avoid wearing bra straps that rub or irritate the site. Protect the skin around your port from seat belts. Place a soft pad over your chest if needed. Bathe or shower as told by your health care provider. The site may get wet as long as you are not actively receiving an infusion. General instructions  Return to your normal activities as told by your health care provider. Ask your health care provider what activities are safe for you. Carry a medical alert card or wear a medical alert bracelet at all times. This will let health care providers know that you have an implanted port in case of an emergency. Where to find more information American Cancer Society: www.cancer.org American Society of Clinical Oncology: www.cancer.net Contact a health care provider if: You have a fever or chills. You have redness, swelling, or pain at the port site. You have fluid or blood coming from your port site. Your incision feels warm to the touch. You have pus or a bad smell coming from the port site. Summary Implanted ports are usually placed in the chest for long-term IV access. Follow instructions from your health care provider about flushing the port and changing bandages (dressings). Take care of the area around your port by avoiding clothing that puts pressure on the area, and by watching for signs of infection. Protect the skin around your port from seat belts. Place a soft pad over your chest if needed. Contact a health care provider if you have a fever or you have redness, swelling, pain, fluid, or a bad smell at the port site. This information is not intended to replace advice given to you by your health care provider. Make sure you discuss any questions you have with your health care provider. Document Revised: 07/15/2020 Document Reviewed: 07/15/2020 Elsevier Patient Education  2023 Elsevier  Inc.  Pegfilgrastim Injection What is this medication? PEGFILGRASTIM (PEG fil gra stim) lowers the risk of infection in people who are receiving chemotherapy. It works by helping your body make more white blood cells, which protects your body from infection. It may also be used to help people who have been exposed to high doses of radiation. This medicine may be used for other purposes; ask your health care provider or pharmacist if you have questions. COMMON BRAND NAME(S): Fulphila, Fylnetra, Neulasta, Nyvepria, Stimufend, UDENYCA, Ziextenzo What should I tell my care team before I take this medication? They need to know if you have any of these conditions: Kidney disease Latex allergy Ongoing radiation therapy Sickle cell disease Skin reactions to acrylic adhesives (On-Body Injector only) An unusual or allergic reaction to pegfilgrastim, filgrastim, other medications, foods, dyes, or preservatives Pregnant or trying to get pregnant Breast-feeding How should I use this medication? This medication is for injection under the   skin. If you get this medication at home, you will be taught how to prepare and give the pre-filled syringe or how to use the On-body Injector. Refer to the patient Instructions for Use for detailed instructions. Use exactly as directed. Tell your care team immediately if you suspect that the On-body Injector may not have performed as intended or if you suspect the use of the On-body Injector resulted in a missed or partial dose. It is important that you put your used needles and syringes in a special sharps container. Do not put them in a trash can. If you do not have a sharps container, call your pharmacist or care team to get one. Talk to your care team about the use of this medication in children. While this medication may be prescribed for selected conditions, precautions do apply. Overdosage: If you think you have taken too much of this medicine contact a poison control  center or emergency room at once. NOTE: This medicine is only for you. Do not share this medicine with others. What if I miss a dose? It is important not to miss your dose. Call your care team if you miss your dose. If you miss a dose due to an On-body Injector failure or leakage, a new dose should be administered as soon as possible using a single prefilled syringe for manual use. What may interact with this medication? Interactions have not been studied. This list may not describe all possible interactions. Give your health care provider a list of all the medicines, herbs, non-prescription drugs, or dietary supplements you use. Also tell them if you smoke, drink alcohol, or use illegal drugs. Some items may interact with your medicine. What should I watch for while using this medication? Your condition will be monitored carefully while you are receiving this medication. You may need blood work done while you are taking this medication. Talk to your care team about your risk of cancer. You may be more at risk for certain types of cancer if you take this medication. If you are going to need a MRI, CT scan, or other procedure, tell your care team that you are using this medication (On-Body Injector only). What side effects may I notice from receiving this medication? Side effects that you should report to your care team as soon as possible: Allergic reactions--skin rash, itching, hives, swelling of the face, lips, tongue, or throat Capillary leak syndrome--stomach or muscle pain, unusual weakness or fatigue, feeling faint or lightheaded, decrease in the amount of urine, swelling of the ankles, hands, or feet, trouble breathing High white blood cell level--fever, fatigue, trouble breathing, night sweats, change in vision, weight loss Inflammation of the aorta--fever, fatigue, back, chest, or stomach pain, severe headache Kidney injury (glomerulonephritis)--decrease in the amount of urine, red or dark  brown urine, foamy or bubbly urine, swelling of the ankles, hands, or feet Shortness of breath or trouble breathing Spleen injury--pain in upper left stomach or shoulder Unusual bruising or bleeding Side effects that usually do not require medical attention (report to your care team if they continue or are bothersome): Bone pain Pain in the hands or feet This list may not describe all possible side effects. Call your doctor for medical advice about side effects. You may report side effects to FDA at 1-800-FDA-1088. Where should I keep my medication? Keep out of the reach of children. If you are using this medication at home, you will be instructed on how to store it. Throw away any unused   medication after the expiration date on the label. NOTE: This sheet is a summary. It may not cover all possible information. If you have questions about this medicine, talk to your doctor, pharmacist, or health care provider.  2023 Elsevier/Gold Standard (2013-04-13 00:00:00)

## 2021-10-16 ENCOUNTER — Other Ambulatory Visit (HOSPITAL_BASED_OUTPATIENT_CLINIC_OR_DEPARTMENT_OTHER): Payer: Self-pay

## 2021-10-19 ENCOUNTER — Telehealth: Payer: Self-pay | Admitting: *Deleted

## 2021-10-19 ENCOUNTER — Other Ambulatory Visit (HOSPITAL_BASED_OUTPATIENT_CLINIC_OR_DEPARTMENT_OTHER): Payer: Self-pay

## 2021-10-19 ENCOUNTER — Telehealth: Payer: Self-pay | Admitting: Nutrition

## 2021-10-19 NOTE — Telephone Encounter (Signed)
Daughter called answering service over weekend requesting ProSource TF. Conformed with [facility] they are working on an order to go out to patient as of 10/17/21. Attempted to call daughter with update. No answer and VM full.

## 2021-10-19 NOTE — Telephone Encounter (Signed)
Contacted patient's daughter to inform her that Prosource TF was ordered and would be delivered. If she runs out before it is delivered, it is acceptable to resume once the shipment is delivered. Daughter understands and was appreciative.

## 2021-10-20 ENCOUNTER — Other Ambulatory Visit: Payer: Self-pay | Admitting: Oncology

## 2021-10-20 ENCOUNTER — Other Ambulatory Visit: Payer: Self-pay | Admitting: Nurse Practitioner

## 2021-10-20 ENCOUNTER — Encounter: Payer: Self-pay | Admitting: Oncology

## 2021-10-20 ENCOUNTER — Other Ambulatory Visit (HOSPITAL_BASED_OUTPATIENT_CLINIC_OR_DEPARTMENT_OTHER): Payer: Self-pay

## 2021-10-20 DIAGNOSIS — C153 Malignant neoplasm of upper third of esophagus: Secondary | ICD-10-CM

## 2021-10-20 MED ORDER — OXYCODONE HCL 5 MG/5ML PO SOLN
5.0000 mg | Freq: Four times a day (QID) | ORAL | 0 refills | Status: DC | PRN
  Filled 2021-10-20: qty 200, 5d supply, fill #0

## 2021-10-21 ENCOUNTER — Encounter: Payer: Self-pay | Admitting: Oncology

## 2021-10-25 ENCOUNTER — Other Ambulatory Visit: Payer: Self-pay | Admitting: Oncology

## 2021-10-25 DIAGNOSIS — C159 Malignant neoplasm of esophagus, unspecified: Secondary | ICD-10-CM

## 2021-10-27 ENCOUNTER — Inpatient Hospital Stay: Payer: Medicare PPO

## 2021-10-27 ENCOUNTER — Inpatient Hospital Stay: Payer: Medicare PPO | Admitting: Licensed Clinical Social Worker

## 2021-10-27 ENCOUNTER — Inpatient Hospital Stay: Payer: Medicare PPO | Admitting: Oncology

## 2021-10-27 ENCOUNTER — Other Ambulatory Visit (HOSPITAL_BASED_OUTPATIENT_CLINIC_OR_DEPARTMENT_OTHER): Payer: Self-pay

## 2021-10-27 ENCOUNTER — Inpatient Hospital Stay: Payer: Medicare PPO | Attending: Oncology

## 2021-10-27 VITALS — BP 107/61 | HR 78

## 2021-10-27 DIAGNOSIS — I959 Hypotension, unspecified: Secondary | ICD-10-CM | POA: Diagnosis not present

## 2021-10-27 DIAGNOSIS — G893 Neoplasm related pain (acute) (chronic): Secondary | ICD-10-CM | POA: Insufficient documentation

## 2021-10-27 DIAGNOSIS — Z5189 Encounter for other specified aftercare: Secondary | ICD-10-CM | POA: Insufficient documentation

## 2021-10-27 DIAGNOSIS — Z79891 Long term (current) use of opiate analgesic: Secondary | ICD-10-CM | POA: Diagnosis not present

## 2021-10-27 DIAGNOSIS — C159 Malignant neoplasm of esophagus, unspecified: Secondary | ICD-10-CM

## 2021-10-27 DIAGNOSIS — C7801 Secondary malignant neoplasm of right lung: Secondary | ICD-10-CM | POA: Insufficient documentation

## 2021-10-27 DIAGNOSIS — Z5111 Encounter for antineoplastic chemotherapy: Secondary | ICD-10-CM | POA: Insufficient documentation

## 2021-10-27 DIAGNOSIS — Z5112 Encounter for antineoplastic immunotherapy: Secondary | ICD-10-CM | POA: Diagnosis present

## 2021-10-27 DIAGNOSIS — C158 Malignant neoplasm of overlapping sites of esophagus: Secondary | ICD-10-CM | POA: Insufficient documentation

## 2021-10-27 DIAGNOSIS — C7802 Secondary malignant neoplasm of left lung: Secondary | ICD-10-CM | POA: Insufficient documentation

## 2021-10-27 DIAGNOSIS — C153 Malignant neoplasm of upper third of esophagus: Secondary | ICD-10-CM

## 2021-10-27 DIAGNOSIS — R1319 Other dysphagia: Secondary | ICD-10-CM | POA: Diagnosis not present

## 2021-10-27 LAB — CBC WITH DIFFERENTIAL (CANCER CENTER ONLY)
Abs Immature Granulocytes: 0.05 10*3/uL (ref 0.00–0.07)
Basophils Absolute: 0.1 10*3/uL (ref 0.0–0.1)
Basophils Relative: 1 %
Eosinophils Absolute: 0.3 10*3/uL (ref 0.0–0.5)
Eosinophils Relative: 4 %
HCT: 36.6 % — ABNORMAL LOW (ref 39.0–52.0)
Hemoglobin: 11.5 g/dL — ABNORMAL LOW (ref 13.0–17.0)
Immature Granulocytes: 1 %
Lymphocytes Relative: 20 %
Lymphs Abs: 1.2 10*3/uL (ref 0.7–4.0)
MCH: 27.9 pg (ref 26.0–34.0)
MCHC: 31.4 g/dL (ref 30.0–36.0)
MCV: 88.8 fL (ref 80.0–100.0)
Monocytes Absolute: 0.9 10*3/uL (ref 0.1–1.0)
Monocytes Relative: 14 %
Neutro Abs: 3.6 10*3/uL (ref 1.7–7.7)
Neutrophils Relative %: 60 %
Platelet Count: 229 10*3/uL (ref 150–400)
RBC: 4.12 MIL/uL — ABNORMAL LOW (ref 4.22–5.81)
RDW: 17.6 % — ABNORMAL HIGH (ref 11.5–15.5)
WBC Count: 6 10*3/uL (ref 4.0–10.5)
nRBC: 0 % (ref 0.0–0.2)

## 2021-10-27 LAB — CMP (CANCER CENTER ONLY)
ALT: 41 U/L (ref 0–44)
AST: 27 U/L (ref 15–41)
Albumin: 3.5 g/dL (ref 3.5–5.0)
Alkaline Phosphatase: 148 U/L — ABNORMAL HIGH (ref 38–126)
Anion gap: 9 (ref 5–15)
BUN: 26 mg/dL — ABNORMAL HIGH (ref 8–23)
CO2: 28 mmol/L (ref 22–32)
Calcium: 10.1 mg/dL (ref 8.9–10.3)
Chloride: 97 mmol/L — ABNORMAL LOW (ref 98–111)
Creatinine: 0.94 mg/dL (ref 0.61–1.24)
GFR, Estimated: >60 mL/min (ref >60)
Glucose, Bld: 128 mg/dL — ABNORMAL HIGH (ref 70–99)
Potassium: 4.2 mmol/L (ref 3.5–5.1)
Sodium: 134 mmol/L — ABNORMAL LOW (ref 135–145)
Total Bilirubin: 0.4 mg/dL (ref 0.3–1.2)
Total Protein: 8.1 g/dL (ref 6.5–8.1)

## 2021-10-27 LAB — MAGNESIUM: Magnesium: 2.1 mg/dL (ref 1.7–2.4)

## 2021-10-27 MED ORDER — SODIUM CHLORIDE 0.9 % IV SOLN
2000.0000 mg/m2 | INTRAVENOUS | Status: DC
  Administered 2021-10-27: 4050 mg via INTRAVENOUS
  Filled 2021-10-27: qty 81

## 2021-10-27 MED ORDER — LEUCOVORIN CALCIUM INJECTION 350 MG
400.0000 mg/m2 | Freq: Once | INTRAVENOUS | Status: AC
  Administered 2021-10-27: 812 mg via INTRAVENOUS
  Filled 2021-10-27: qty 40.6

## 2021-10-27 MED ORDER — SODIUM CHLORIDE 0.9 % IV SOLN
10.0000 mg | Freq: Once | INTRAVENOUS | Status: AC
  Administered 2021-10-27: 10 mg via INTRAVENOUS
  Filled 2021-10-27: qty 10

## 2021-10-27 MED ORDER — PALONOSETRON HCL INJECTION 0.25 MG/5ML
0.2500 mg | Freq: Once | INTRAVENOUS | Status: AC
  Administered 2021-10-27: 0.25 mg via INTRAVENOUS
  Filled 2021-10-27: qty 5

## 2021-10-27 MED ORDER — DEXTROSE 5 % IV SOLN: Freq: Once | INTRAVENOUS | Status: AC

## 2021-10-27 MED ORDER — OXALIPLATIN CHEMO INJECTION 100 MG/20ML
85.0000 mg/m2 | Freq: Once | INTRAVENOUS | Status: AC
  Administered 2021-10-27: 175 mg via INTRAVENOUS
  Filled 2021-10-27: qty 35

## 2021-10-27 MED ORDER — OXYCODONE HCL 5 MG/5ML PO SOLN
5.0000 mg | Freq: Four times a day (QID) | ORAL | 0 refills | Status: DC | PRN
  Filled 2021-10-27: qty 200, 5d supply, fill #0

## 2021-10-27 MED ORDER — FLUOROURACIL CHEMO INJECTION 2.5 GM/50ML
400.0000 mg/m2 | Freq: Once | INTRAVENOUS | Status: AC
  Administered 2021-10-27: 800 mg via INTRAVENOUS
  Filled 2021-10-27: qty 16

## 2021-10-27 MED ORDER — SODIUM CHLORIDE 0.9 % IV SOLN
240.0000 mg | Freq: Once | INTRAVENOUS | Status: AC
  Administered 2021-10-27: 240 mg via INTRAVENOUS
  Filled 2021-10-27: qty 24

## 2021-10-27 NOTE — Progress Notes (Signed)
  [facility] OFFICE PROGRESS NOTE   Diagnosis: Esophagus cancer  INTERVAL HISTORY:   Leslie Livingston completed another cycle of FOLFOX/nivolumab on 10/13/2021.  No rash, nausea, diarrhea, or neuropathy symptoms.  He continues tube feedings.  He is tolerating water by mouth.  He takes oxycodone as needed for pain.  Objective:  Vital signs in last 24 hours:  Blood pressure 112/65, pulse 95, temperature 98.1 F (36.7 C), temperature source Oral, resp. rate 18, height _0  (1.854 m), weight 197 lb 9.6 oz (89.6 kg), SpO2 100 %.    HEENT: No thrush or ulcers Lymphatics: No cervical or supraclavicular nodes Resp: Lungs clear bilaterally Cardio: Regular rate and rhythm GI: No hepatosplenomegaly, left upper quadrant feeding tube Vascular: No leg edema Skin: Diffuse dryness  Portacath/PICC-without erythema  Lab Results:  Lab Results  Component Value Date   WBC 6.0 10/27/2021   HGB 11.5 (L) 10/27/2021   HCT 36.6 (L) 10/27/2021   MCV 88.8 10/27/2021   PLT 229 10/27/2021   NEUTROABS 3.6 10/27/2021    CMP  Lab Results  Component Value Date   NA 134 (L) 10/27/2021   K 4.2 10/27/2021   CL 97 (L) 10/27/2021   CO2 28 10/27/2021   GLUCOSE 128 (H) 10/27/2021   BUN 26 (H) 10/27/2021   CREATININE 0.94 10/27/2021   CALCIUM 10.1 10/27/2021   PROT 8.1 10/27/2021   ALBUMIN 3.5 10/27/2021   AST 27 10/27/2021   ALT 41 10/27/2021   ALKPHOS 148 (H) 10/27/2021   BILITOT 0.4 10/27/2021   GFRNONAA >60 10/27/2021   GFRAA >60 05/06/2018    Medications: I have reviewed the patient's current medications.   Assessment/Plan: Metastatic esophageal cancer CT neck 07/29/2021-adenopathy at the thoracic inlet and lower bilateral jugular chains, esophagus mass, left vocal cord paralysis CT chest 07/29/2021-diffuse esophageal wall thickening, most pronounced in the proximal esophagus, multiple pulmonary nodules, mediastinal and supraclavicular lymphadenopathy Upper endoscopy  08/07/2021-mass/stricture extending from the UES to 30 cm CTs chest, abdomen, and pelvis 08/07/2021-long segment wall thickening of the esophagus, prominent proximally, multiple large supraclavicular mediastinal nodes, bilateral lung nodules,, and upper abdominal nodes similar to remote abdominal CT Biopsy 08/07/2021 showed invasive moderately differentiated squamous cell carcinoma Foundation 1-MSS, tumor mutation burden 7, PD-L1 CPS 10 Esophagus radiation 08/13/2021 - 09/03/2021 Cycle 1 FOLFOX/nivolumab 09/08/2021 Cycle 2 held 09/22/2021 due to neutropenia Cycle 2 FOLFOX/nivolumab 09/29/2021, Udenyca Cycle 3 FOLFOX/nivolumab 10/13/2021, Udenyca Cycle 4 FOLFOX/nivolumab 10/27/2021, Udenyca   Solid/liquid dysphagia secondary to #1 -Gastrostomy tube placement 07/31/2021 Weight loss secondary to #1 Odynophagia secondary to #1 History of colon polyps-tubular adenomas History of a diverticular bleed-April 2020 Anemia Thrombocytosis Left subclavian Port-A-Cath placement 08/10/2021 Left upper extremity edema -Doppler ultrasound of left upper extremity 08/13/2021-negative for DVT     Disposition: Mr. Stiehl appears stable.  He is tolerating the FOLFOX/nivolumab well.  He will complete cycle 4 today.  He will return for an office visit and cycle 5 chemotherapy in 2 weeks.  He will be referred for a restaging CT after cycle 5.  I refilled his prescription for oxycodone.  I encouraged him to try liquids by mouth.  His blood pressure has been low on multiple determinations at the Cancer center.  He will discontinue amlodipine.  Betsy Coder, MD  10/27/2021  9:35 AM

## 2021-10-27 NOTE — Progress Notes (Signed)
Patient seen by Dr. Sherrill today ? ?Vitals are within treatment parameters. ? ?Labs reviewed by Dr. Sherrill and are within treatment parameters. ? ?Per physician team, patient is ready for treatment and there are NO modifications to the treatment plan.  ?

## 2021-10-27 NOTE — Patient Instructions (Addendum)
[facility]   Discharge Instructions: Thank you for choosing [facility] to provide your oncology and hematology care.   If you have a lab appointment with the [facility], please go directly to the [facility] and check in at the registration area.   Wear comfortable clothing and clothing appropriate for easy access to any Portacath or PICC line.   We strive to give you quality time with your provider. You may need to reschedule your appointment if you arrive late (15 or more minutes).  Arriving late affects you and other patients whose appointments are after yours.  Also, if you miss three or more appointments without notifying the office, you may be dismissed from the clinic at the provider's discretion.      For prescription refill requests, have your pharmacy contact our office and allow 72 hours for refills to be completed.    Today you received the following chemotherapy and/or immunotherapy agents Nivolumab (OPDIVO), Oxaliplatin (ELOXATIN), Leucovorin & Flourouracil (ADRUCIL).      To help prevent nausea and vomiting after your treatment, we encourage you to take your nausea medication as directed.  BELOW ARE SYMPTOMS THAT SHOULD BE REPORTED IMMEDIATELY: *FEVER GREATER THAN 100.4 F (38 C) OR HIGHER *CHILLS OR SWEATING *NAUSEA AND VOMITING THAT IS NOT CONTROLLED WITH YOUR NAUSEA MEDICATION *UNUSUAL SHORTNESS OF BREATH *UNUSUAL BRUISING OR BLEEDING *URINARY PROBLEMS (pain or burning when urinating, or frequent urination) *BOWEL PROBLEMS (unusual diarrhea, constipation, pain near the anus) TENDERNESS IN MOUTH AND THROAT WITH OR WITHOUT PRESENCE OF ULCERS (sore throat, sores in mouth, or a toothache) UNUSUAL RASH, SWELLING OR PAIN  UNUSUAL VAGINAL DISCHARGE OR ITCHING   Items with * indicate a potential emergency and should be followed up as soon as possible or go to the Emergency Department if any problems should occur.  Please show  the CHEMOTHERAPY ALERT CARD or IMMUNOTHERAPY ALERT CARD at check-in to the Emergency Department and triage nurse.  Should you have questions after your visit or need to cancel or reschedule your appointment, please contact [facility]  Dept: [phone]  and follow the prompts.  Office hours are 8:00 a.m. to 4:30 p.m. Monday - Friday. Please note that voicemails left after 4:00 p.m. may not be returned until the following business day.  We are closed weekends and major holidays. You have access to a nurse at all times for urgent questions. Please call the main number to the clinic Dept: [phone] and follow the prompts.   For any non-urgent questions, you may also contact your provider using MyChart. We now offer e-Visits for anyone 16 and older to request care online for non-urgent symptoms. For details visit [email or web address].   Also download the MyChart app! Go to the app store, search "MyChart", open the app, select [facility], and log in with your MyChart username and password.  Masks are optional in the cancer centers. If you would like for your care team to wear a mask while they are taking care of you, please let them know. You may have one support person who is at least 72 years old accompany you for your appointments.  Nivolumab Injection What is this medication? NIVOLUMAB (nye VOL ue mab) treats some types of cancer. It works by helping your immune system slow or stop the spread of cancer cells. It is a monoclonal antibody. This medicine may be used for other purposes; ask your health care provider or pharmacist if you  have questions. COMMON BRAND NAME(S): Opdivo What should I tell my care team before I take this medication? They need to know if you have any of these conditions: Allogeneic stem cell transplant (uses someone else's stem cells) Autoimmune diseases, such as Crohn disease, ulcerative colitis, lupus History of chest radiation Nervous  system problems, such as Guillain-Barre syndrome or myasthenia gravis Organ transplant An unusual or allergic reaction to nivolumab, other medications, foods, dyes, or preservatives Pregnant or trying to get pregnant Breast-feeding How should I use this medication? This medication is infused into a vein. It is given in a hospital or clinic setting. A special MedGuide will be given to you before each treatment. Be sure to read this information carefully each time. Talk to your care team about the use of this medication in children. While it may be prescribed for children as young as 12 years for selected conditions, precautions do apply. Overdosage: If you think you have taken too much of this medicine contact a poison control center or emergency room at once. NOTE: This medicine is only for you. Do not share this medicine with others. What if I miss a dose? Keep appointments for follow-up doses. It is important not to miss your dose. Call your care team if you are unable to keep an appointment. What may interact with this medication? Interactions have not been studied. This list may not describe all possible interactions. Give your health care provider a list of all the medicines, herbs, non-prescription drugs, or dietary supplements you use. Also tell them if you smoke, drink alcohol, or use illegal drugs. Some items may interact with your medicine. What should I watch for while using this medication? Your condition will be monitored carefully while you are receiving this medication. You may need blood work while taking this medication. This medication may cause serious skin reactions. They can happen weeks to months after starting the medication. Contact your care team right away if you notice fevers or flu-like symptoms with a rash. The rash may be red or purple and then turn into blisters or peeling of the skin. You may also notice a red rash with swelling of the face, lips, or lymph nodes in  your neck or under your arms. Tell your care team right away if you have any change in your eyesight. Talk to your care team if you are pregnant or think you might be pregnant. A negative pregnancy test is required before starting this medication. A reliable form of contraception is recommended while taking this medication and for 5 months after the last dose. Talk to your care team about effective forms of contraception. Do not breast-feed while taking this medication and for 5 months after the last dose. What side effects may I notice from receiving this medication? Side effects that you should report to your care team as soon as possible: Allergic reactions--skin rash, itching, hives, swelling of the face, lips, tongue, or throat Dry cough, shortness of breath or trouble breathing Eye pain, redness, irritation, or discharge with blurry or decreased vision Heart muscle inflammation--unusual weakness or fatigue, shortness of breath, chest pain, fast or irregular heartbeat, dizziness, swelling of the ankles, feet, or hands Hormone gland problems--headache, sensitivity to light, unusual weakness or fatigue, dizziness, fast or irregular heartbeat, increased sensitivity to cold or heat, excessive sweating, constipation, hair loss, increased thirst or amount of urine, tremors or shaking, irritability Infusion reactions--chest pain, shortness of breath or trouble breathing, feeling faint or lightheaded Kidney injury (glomerulonephritis)--decrease in  the amount of urine, red or dark brown urine, foamy or bubbly urine, swelling of the ankles, hands, or feet Liver injury--right upper belly pain, loss of appetite, nausea, light-colored stool, dark yellow or brown urine, yellowing skin or eyes, unusual weakness or fatigue Pain, tingling, or numbness in the hands or feet, muscle weakness, change in vision, confusion or trouble speaking, loss of balance or coordination, trouble walking, seizures Rash, fever, and  swollen lymph nodes Redness, blistering, peeling, or loosening of the skin, including inside the mouth Sudden or severe stomach pain, bloody diarrhea, fever, nausea, vomiting Side effects that usually do not require medical attention (report these to your care team if they continue or are bothersome): Bone, joint, or muscle pain Diarrhea Fatigue Loss of appetite Nausea Skin rash This list may not describe all possible side effects. Call your doctor for medical advice about side effects. You may report side effects to FDA at 1-800-FDA-1088. Where should I keep my medication? This medication is given in a hospital or clinic. It will not be stored at home. NOTE: This sheet is a summary. It may not cover all possible information. If you have questions about this medicine, talk to your doctor, pharmacist, or health care provider.  2023 Elsevier/Gold Standard (2020-12-12 00:00:00)  Oxaliplatin Injection What is this medication? OXALIPLATIN (ox AL i PLA tin) treats some types of cancer. It works by slowing down the growth of cancer cells. This medicine may be used for other purposes; ask your health care provider or pharmacist if you have questions. COMMON BRAND NAME(S): Eloxatin What should I tell my care team before I take this medication? They need to know if you have any of these conditions: Heart disease History of irregular heartbeat or rhythm Liver disease Low blood cell levels (white cells, red cells, and platelets) Lung or breathing disease, such as asthma Take medications that treat or prevent blood clots Tingling of the fingers, toes, or other nerve disorder An unusual or allergic reaction to oxaliplatin, other medications, foods, dyes, or preservatives If you or your partner are pregnant or trying to get pregnant Breast-feeding How should I use this medication? This medication is injected into a vein. It is given by your care team in a hospital or clinic setting. Talk to your  care team about the use of this medication in children. Special care may be needed. Overdosage: If you think you have taken too much of this medicine contact a poison control center or emergency room at once. NOTE: This medicine is only for you. Do not share this medicine with others. What if I miss a dose? Keep appointments for follow-up doses. It is important not to miss a dose. Call your care team if you are unable to keep an appointment. What may interact with this medication? Do not take this medication with any of the following: Cisapride Dronedarone Pimozide Thioridazine This medication may also interact with the following: Aspirin and aspirin-like medications Certain medications that treat or prevent blood clots, such as warfarin, apixaban, dabigatran, and rivaroxaban Cisplatin Cyclosporine Diuretics Medications for infection, such as acyclovir, adefovir, amphotericin B, bacitracin, cidofovir, foscarnet, ganciclovir, gentamicin, pentamidine, vancomycin NSAIDs, medications for pain and inflammation, such as ibuprofen or naproxen Other medications that cause heart rhythm changes Pamidronate Zoledronic acid This list may not describe all possible interactions. Give your health care provider a list of all the medicines, herbs, non-prescription drugs, or dietary supplements you use. Also tell them if you smoke, drink alcohol, or use illegal drugs. Some  items may interact with your medicine. What should I watch for while using this medication? Your condition will be monitored carefully while you are receiving this medication. You may need blood work while taking this medication. This medication may make you feel generally unwell. This is not uncommon as chemotherapy can affect healthy cells as well as cancer cells. Report any side effects. Continue your course of treatment even though you feel ill unless your care team tells you to stop. This medication may increase your risk of getting  an infection. Call your care team for advice if you get a fever, chills, sore throat, or other symptoms of a cold or flu. Do not treat yourself. Try to avoid being around people who are sick. Avoid taking medications that contain aspirin, acetaminophen, ibuprofen, naproxen, or ketoprofen unless instructed by your care team. These medications may hide a fever. Be careful brushing or flossing your teeth or using a toothpick because you may get an infection or bleed more easily. If you have any dental work done, tell your dentist you are receiving this medication. This medication can make you more sensitive to cold. Do not drink cold drinks or use ice. Cover exposed skin before coming in contact with cold temperatures or cold objects. When out in cold weather wear warm clothing and cover your mouth and nose to warm the air that goes into your lungs. Tell your care team if you get sensitive to the cold. Talk to your care team if you or your partner are pregnant or think either of you might be pregnant. This medication can cause serious birth defects if taken during pregnancy and for 9 months after the last dose. A negative pregnancy test is required before starting this medication. A reliable form of contraception is recommended while taking this medication and for 9 months after the last dose. Talk to your care team about effective forms of contraception. Do not father a child while taking this medication and for 6 months after the last dose. Use a condom while having sex during this time period. Do not breastfeed while taking this medication and for 3 months after the last dose. This medication may cause infertility. Talk to your care team if you are concerned about your fertility. What side effects may I notice from receiving this medication? Side effects that you should report to your care team as soon as possible: Allergic reactions--skin rash, itching, hives, swelling of the face, lips, tongue, or  throat Bleeding--bloody or black, tar-like stools, vomiting blood or brown material that looks like coffee grounds, red or dark brown urine, small red or purple spots on skin, unusual bruising or bleeding Dry cough, shortness of breath or trouble breathing Heart rhythm changes--fast or irregular heartbeat, dizziness, feeling faint or lightheaded, chest pain, trouble breathing Infection--fever, chills, cough, sore throat, wounds that don't heal, pain or trouble when passing urine, general feeling of discomfort or being unwell Liver injury--right upper belly pain, loss of appetite, nausea, light-colored stool, dark yellow or brown urine, yellowing skin or eyes, unusual weakness or fatigue Low red blood cell level--unusual weakness or fatigue, dizziness, headache, trouble breathing Muscle injury--unusual weakness or fatigue, muscle pain, dark yellow or brown urine, decrease in amount of urine Pain, tingling, or numbness in the hands or feet Sudden and severe headache, confusion, change in vision, seizures, which may be signs of posterior reversible encephalopathy syndrome (PRES) Unusual bruising or bleeding Side effects that usually do not require medical attention (report to your care team  if they continue or are bothersome): Diarrhea Nausea Pain, redness, or swelling with sores inside the mouth or throat Unusual weakness or fatigue Vomiting This list may not describe all possible side effects. Call your doctor for medical advice about side effects. You may report side effects to FDA at 1-800-FDA-1088. Where should I keep my medication? This medication is given in a hospital or clinic. It will not be stored at home. NOTE: This sheet is a summary. It may not cover all possible information. If you have questions about this medicine, talk to your doctor, pharmacist, or health care provider.  2023 Elsevier/Gold Standard (2021-05-08 00:00:00)  Leucovorin Injection What is this  medication? LEUCOVORIN (loo koe VOR in) prevents side effects from certain medications, such as methotrexate. It works by increasing folate levels. This helps protect healthy cells in your body. It may also be used to treat anemia caused by low levels of folate. It can also be used with fluorouracil, a type of chemotherapy, to treat colorectal cancer. It works by increasing the effects of fluorouracil in the body. This medicine may be used for other purposes; ask your health care provider or pharmacist if you have questions. What should I tell my care team before I take this medication? They need to know if you have any of these conditions: Anemia from low levels of vitamin B12 in the blood An unusual or allergic reaction to leucovorin, folic acid, other medications, foods, dyes, or preservatives Pregnant or trying to get pregnant Breastfeeding How should I use this medication? This medication is injected into a vein or a muscle. It is given by your care team in a hospital or clinic setting. Talk to your care team about the use of this medication in children. Special care may be needed. Overdosage: If you think you have taken too much of this medicine contact a poison control center or emergency room at once. NOTE: This medicine is only for you. Do not share this medicine with others. What if I miss a dose? Keep appointments for follow-up doses. It is important not to miss your dose. Call your care team if you are unable to keep an appointment. What may interact with this medication? Capecitabine Fluorouracil Phenobarbital Phenytoin Primidone Trimethoprim;sulfamethoxazole This list may not describe all possible interactions. Give your health care provider a list of all the medicines, herbs, non-prescription drugs, or dietary supplements you use. Also tell them if you smoke, drink alcohol, or use illegal drugs. Some items may interact with your medicine. What should I watch for while using  this medication? Your condition will be monitored carefully while you are receiving this medication. This medication may increase the side effects of 5-fluorouracil. Tell your care team if you have diarrhea or mouth sores that do not get better or that get worse. What side effects may I notice from receiving this medication? Side effects that you should report to your care team as soon as possible: Allergic reactions--skin rash, itching, hives, swelling of the face, lips, tongue, or throat This list may not describe all possible side effects. Call your doctor for medical advice about side effects. You may report side effects to FDA at 1-800-FDA-1088. Where should I keep my medication? This medication is given in a hospital or clinic. It will not be stored at home. NOTE: This sheet is a summary. It may not cover all possible information. If you have questions about this medicine, talk to your doctor, pharmacist, or health care provider.  2023 Elsevier/Gold Standard (  2021-05-22 00:00:00)  Fluorouracil Injection What is this medication? FLUOROURACIL (flure oh YOOR a sil) treats some types of cancer. It works by slowing down the growth of cancer cells. This medicine may be used for other purposes; ask your health care provider or pharmacist if you have questions. COMMON BRAND NAME(S): Adrucil What should I tell my care team before I take this medication? They need to know if you have any of these conditions: Blood disorders Dihydropyrimidine dehydrogenase (DPD) deficiency Infection, such as chickenpox, cold sores, herpes Kidney disease Liver disease Poor nutrition Recent or ongoing radiation therapy An unusual or allergic reaction to fluorouracil, other medications, foods, dyes, or preservatives If you or your partner are pregnant or trying to get pregnant Breast-feeding How should I use this medication? This medication is injected into a vein. It is administered by your care team in a  hospital or clinic setting. Talk to your care team about the use of this medication in children. Special care may be needed. Overdosage: If you think you have taken too much of this medicine contact a poison control center or emergency room at once. NOTE: This medicine is only for you. Do not share this medicine with others. What if I miss a dose? Keep appointments for follow-up doses. It is important not to miss your dose. Call your care team if you are unable to keep an appointment. What may interact with this medication? Do not take this medication with any of the following: Live virus vaccines This medication may also interact with the following: Medications that treat or prevent blood clots, such as warfarin, enoxaparin, dalteparin This list may not describe all possible interactions. Give your health care provider a list of all the medicines, herbs, non-prescription drugs, or dietary supplements you use. Also tell them if you smoke, drink alcohol, or use illegal drugs. Some items may interact with your medicine. What should I watch for while using this medication? Your condition will be monitored carefully while you are receiving this medication. This medication may make you feel generally unwell. This is not uncommon as chemotherapy can affect healthy cells as well as cancer cells. Report any side effects. Continue your course of treatment even though you feel ill unless your care team tells you to stop. In some cases, you may be given additional medications to help with side effects. Follow all directions for their use. This medication may increase your risk of getting an infection. Call your care team for advice if you get a fever, chills, sore throat, or other symptoms of a cold or flu. Do not treat yourself. Try to avoid being around people who are sick. This medication may increase your risk to bruise or bleed. Call your care team if you notice any unusual bleeding. Be careful brushing  or flossing your teeth or using a toothpick because you may get an infection or bleed more easily. If you have any dental work done, tell your dentist you are receiving this medication. Avoid taking medications that contain aspirin, acetaminophen, ibuprofen, naproxen, or ketoprofen unless instructed by your care team. These medications may hide a fever. Do not treat diarrhea with over the counter products. Contact your care team if you have diarrhea that lasts more than 2 days or if it is severe and watery. This medication can make you more sensitive to the sun. Keep out of the sun. If you cannot avoid being in the sun, wear protective clothing and sunscreen. Do not use sun lamps, tanning beds, or  tanning booths. Talk to your care team if you or your partner wish to become pregnant or think you might be pregnant. This medication can cause serious birth defects if taken during pregnancy and for 3 months after the last dose. A reliable form of contraception is recommended while taking this medication and for 3 months after the last dose. Talk to your care team about effective forms of contraception. Do not father a child while taking this medication and for 3 months after the last dose. Use a condom while having sex during this time period. Do not breastfeed while taking this medication. This medication may cause infertility. Talk to your care team if you are concerned about your fertility. What side effects may I notice from receiving this medication? Side effects that you should report to your care team as soon as possible: Allergic reactions--skin rash, itching, hives, swelling of the face, lips, tongue, or throat Heart attack--pain or tightness in the chest, shoulders, arms, or jaw, nausea, shortness of breath, cold or clammy skin, feeling faint or lightheaded Heart failure--shortness of breath, swelling of the ankles, feet, or hands, sudden weight gain, unusual weakness or fatigue Heart rhythm  changes--fast or irregular heartbeat, dizziness, feeling faint or lightheaded, chest pain, trouble breathing High ammonia level--unusual weakness or fatigue, confusion, loss of appetite, nausea, vomiting, seizures Infection--fever, chills, cough, sore throat, wounds that don't heal, pain or trouble when passing urine, general feeling of discomfort or being unwell Low red blood cell level--unusual weakness or fatigue, dizziness, headache, trouble breathing Pain, tingling, or numbness in the hands or feet, muscle weakness, change in vision, confusion or trouble speaking, loss of balance or coordination, trouble walking, seizures Redness, swelling, and blistering of the skin over hands and feet Severe or prolonged diarrhea Unusual bruising or bleeding Side effects that usually do not require medical attention (report to your care team if they continue or are bothersome): Dry skin Headache Increased tears Nausea Pain, redness, or swelling with sores inside the mouth or throat Sensitivity to light Vomiting This list may not describe all possible side effects. Call your doctor for medical advice about side effects. You may report side effects to FDA at 1-800-FDA-1088. Where should I keep my medication? This medication is given in a hospital or clinic. It will not be stored at home. NOTE: This sheet is a summary. It may not cover all possible information. If you have questions about this medicine, talk to your doctor, pharmacist, or health care provider.  2023 [employer] Standard (2021-05-19 00:00:00)  The chemotherapy medication bag should finish at 46 hours, 96 hours, or 7 days. For example, if your pump is scheduled for 46 hours and it was put on at 4:00 p.m., it should finish at 2:00 p.m. the day it is scheduled to come off regardless of your appointment time.     Estimated time to finish at 12:30 p.m. on Thursday 10/29/2021.   If the display on your pump reads "Low Volume" and it is  beeping, take the batteries out of the pump and come to the cancer center for it to be taken off.   If the pump alarms go off prior to the pump reading "Low Volume" then call [phone] and someone can assist you.  If the plunger comes out and the chemotherapy medication is leaking out, please use your home chemo spill kit to clean up the spill. Do NOT use paper towels or other household products.  If you have problems or questions regarding your pump,  please call either [phone] (24 hours a day) or the cancer center Monday-Friday 8:00 a.m.- 4:30 p.m. at the clinic number and we will assist you. If you are unable to get assistance, then go to the nearest Emergency Department and ask the staff to contact the IV team for assistance.

## 2021-10-27 NOTE — Progress Notes (Signed)
Leslie Livingston  Holiday representative met with patient to assess needs.  Patient had completed his infusion and was waiting for his daughter to provide transportation.  Patient requested assistance with gas.  CSW provided a [employer] card and explained the maximum amount available.  He said he understood.  Patient reported eating minimal amounts due to his cancer.  He expressed no other needs.  CSW provided supportive counseling.    Leslie Pickle Maiko Salais, LCSW

## 2021-10-28 ENCOUNTER — Other Ambulatory Visit: Payer: Self-pay

## 2021-10-29 ENCOUNTER — Inpatient Hospital Stay: Payer: Medicare PPO

## 2021-10-29 VITALS — BP 109/68 | HR 93 | Temp 99.2°F | Resp 20

## 2021-10-29 DIAGNOSIS — Z5112 Encounter for antineoplastic immunotherapy: Secondary | ICD-10-CM | POA: Diagnosis not present

## 2021-10-29 DIAGNOSIS — C159 Malignant neoplasm of esophagus, unspecified: Secondary | ICD-10-CM

## 2021-10-29 MED ORDER — HEPARIN SOD (PORK) LOCK FLUSH 100 UNIT/ML IV SOLN
500.0000 [IU] | Freq: Once | INTRAVENOUS | Status: AC | PRN
  Administered 2021-10-29: 500 [IU]

## 2021-10-29 MED ORDER — PEGFILGRASTIM-CBQV 6 MG/0.6ML ~~LOC~~ SOSY
6.0000 mg | PREFILLED_SYRINGE | Freq: Once | SUBCUTANEOUS | Status: AC
  Administered 2021-10-29: 6 mg via SUBCUTANEOUS
  Filled 2021-10-29: qty 0.6

## 2021-10-29 MED ORDER — SODIUM CHLORIDE 0.9% FLUSH
10.0000 mL | INTRAVENOUS | Status: DC | PRN
  Administered 2021-10-29: 10 mL

## 2021-10-29 NOTE — Patient Instructions (Signed)

## 2021-11-02 ENCOUNTER — Other Ambulatory Visit: Payer: Self-pay | Admitting: Oncology

## 2021-11-02 ENCOUNTER — Other Ambulatory Visit (HOSPITAL_BASED_OUTPATIENT_CLINIC_OR_DEPARTMENT_OTHER): Payer: Self-pay

## 2021-11-02 DIAGNOSIS — C153 Malignant neoplasm of upper third of esophagus: Secondary | ICD-10-CM

## 2021-11-03 ENCOUNTER — Other Ambulatory Visit: Payer: Self-pay | Admitting: Nurse Practitioner

## 2021-11-03 ENCOUNTER — Other Ambulatory Visit (HOSPITAL_BASED_OUTPATIENT_CLINIC_OR_DEPARTMENT_OTHER): Payer: Self-pay

## 2021-11-03 DIAGNOSIS — C153 Malignant neoplasm of upper third of esophagus: Secondary | ICD-10-CM

## 2021-11-03 MED ORDER — OXYCODONE HCL 5 MG/5ML PO SOLN
5.0000 mg | Freq: Four times a day (QID) | ORAL | 0 refills | Status: DC | PRN
  Filled 2021-11-03: qty 200, 5d supply, fill #0

## 2021-11-07 ENCOUNTER — Other Ambulatory Visit: Payer: Self-pay | Admitting: Oncology

## 2021-11-07 DIAGNOSIS — C159 Malignant neoplasm of esophagus, unspecified: Secondary | ICD-10-CM

## 2021-11-10 ENCOUNTER — Other Ambulatory Visit: Payer: Self-pay | Admitting: *Deleted

## 2021-11-10 ENCOUNTER — Inpatient Hospital Stay: Payer: Medicare PPO

## 2021-11-10 ENCOUNTER — Other Ambulatory Visit (HOSPITAL_BASED_OUTPATIENT_CLINIC_OR_DEPARTMENT_OTHER): Payer: Self-pay

## 2021-11-10 ENCOUNTER — Inpatient Hospital Stay: Payer: Medicare PPO | Admitting: Oncology

## 2021-11-10 ENCOUNTER — Encounter: Payer: Self-pay | Admitting: *Deleted

## 2021-11-10 VITALS — BP 107/72 | HR 72 | Temp 98.1°F | Resp 18 | Ht 73.0 in | Wt 203.6 lb

## 2021-11-10 VITALS — BP 108/73 | HR 88 | Resp 20

## 2021-11-10 DIAGNOSIS — C159 Malignant neoplasm of esophagus, unspecified: Secondary | ICD-10-CM

## 2021-11-10 DIAGNOSIS — Z5112 Encounter for antineoplastic immunotherapy: Secondary | ICD-10-CM | POA: Diagnosis not present

## 2021-11-10 DIAGNOSIS — C153 Malignant neoplasm of upper third of esophagus: Secondary | ICD-10-CM | POA: Diagnosis not present

## 2021-11-10 LAB — CMP (CANCER CENTER ONLY)
ALT: 19 U/L (ref 0–44)
AST: 21 U/L (ref 15–41)
Albumin: 3.4 g/dL — ABNORMAL LOW (ref 3.5–5.0)
Alkaline Phosphatase: 124 U/L (ref 38–126)
Anion gap: 11 (ref 5–15)
BUN: 25 mg/dL — ABNORMAL HIGH (ref 8–23)
CO2: 27 mmol/L (ref 22–32)
Calcium: 10.1 mg/dL (ref 8.9–10.3)
Chloride: 98 mmol/L (ref 98–111)
Creatinine: 0.93 mg/dL (ref 0.61–1.24)
GFR, Estimated: >60 mL/min (ref >60)
Glucose, Bld: 158 mg/dL — ABNORMAL HIGH (ref 70–99)
Potassium: 4.2 mmol/L (ref 3.5–5.1)
Sodium: 136 mmol/L (ref 135–145)
Total Bilirubin: 0.6 mg/dL (ref 0.3–1.2)
Total Protein: 7.5 g/dL (ref 6.5–8.1)

## 2021-11-10 LAB — CBC WITH DIFFERENTIAL (CANCER CENTER ONLY)
Abs Immature Granulocytes: 0.02 10*3/uL (ref 0.00–0.07)
Basophils Absolute: 0 10*3/uL (ref 0.0–0.1)
Basophils Relative: 1 %
Eosinophils Absolute: 0.1 10*3/uL (ref 0.0–0.5)
Eosinophils Relative: 2 %
HCT: 37.5 % — ABNORMAL LOW (ref 39.0–52.0)
Hemoglobin: 11.8 g/dL — ABNORMAL LOW (ref 13.0–17.0)
Immature Granulocytes: 0 %
Lymphocytes Relative: 31 %
Lymphs Abs: 1.5 10*3/uL (ref 0.7–4.0)
MCH: 28.1 pg (ref 26.0–34.0)
MCHC: 31.5 g/dL (ref 30.0–36.0)
MCV: 89.3 fL (ref 80.0–100.0)
Monocytes Absolute: 0.8 10*3/uL (ref 0.1–1.0)
Monocytes Relative: 16 %
Neutro Abs: 2.3 10*3/uL (ref 1.7–7.7)
Neutrophils Relative %: 50 %
Platelet Count: 189 10*3/uL (ref 150–400)
RBC: 4.2 MIL/uL — ABNORMAL LOW (ref 4.22–5.81)
RDW: 17.2 % — ABNORMAL HIGH (ref 11.5–15.5)
WBC Count: 4.7 10*3/uL (ref 4.0–10.5)
nRBC: 0 % (ref 0.0–0.2)

## 2021-11-10 MED ORDER — PALONOSETRON HCL INJECTION 0.25 MG/5ML
0.2500 mg | Freq: Once | INTRAVENOUS | Status: AC
  Administered 2021-11-10: 0.25 mg via INTRAVENOUS
  Filled 2021-11-10: qty 5

## 2021-11-10 MED ORDER — SODIUM CHLORIDE 0.9% FLUSH
10.0000 mL | INTRAVENOUS | Status: DC | PRN
  Administered 2021-11-10: 10 mL

## 2021-11-10 MED ORDER — OXYCODONE HCL 5 MG/5ML PO SOLN
5.0000 mg | Freq: Four times a day (QID) | ORAL | 0 refills | Status: DC | PRN
  Filled 2021-11-10: qty 110, 2d supply, fill #0
  Filled 2021-11-10: qty 90, 3d supply, fill #0

## 2021-11-10 MED ORDER — SODIUM CHLORIDE 0.9 % IV SOLN
10.0000 mg | Freq: Once | INTRAVENOUS | Status: AC
  Administered 2021-11-10: 10 mg via INTRAVENOUS
  Filled 2021-11-10: qty 1

## 2021-11-10 MED ORDER — LEUCOVORIN CALCIUM INJECTION 350 MG
400.0000 mg/m2 | Freq: Once | INTRAVENOUS | Status: AC
  Administered 2021-11-10: 812 mg via INTRAVENOUS
  Filled 2021-11-10: qty 17.5

## 2021-11-10 MED ORDER — SODIUM CHLORIDE 0.9 % IV SOLN
2000.0000 mg/m2 | INTRAVENOUS | Status: DC
  Administered 2021-11-10: 4050 mg via INTRAVENOUS
  Filled 2021-11-10: qty 81

## 2021-11-10 MED ORDER — DEXTROSE 5 % IV SOLN: Freq: Once | INTRAVENOUS | Status: AC

## 2021-11-10 MED ORDER — FLUOROURACIL CHEMO INJECTION 2.5 GM/50ML
400.0000 mg/m2 | Freq: Once | INTRAVENOUS | Status: AC
  Administered 2021-11-10: 800 mg via INTRAVENOUS
  Filled 2021-11-10: qty 16

## 2021-11-10 MED ORDER — OXALIPLATIN CHEMO INJECTION 100 MG/20ML
85.0000 mg/m2 | Freq: Once | INTRAVENOUS | Status: AC
  Administered 2021-11-10: 175 mg via INTRAVENOUS
  Filled 2021-11-10: qty 35

## 2021-11-10 MED ORDER — SODIUM CHLORIDE 0.9 % IV SOLN
240.0000 mg | Freq: Once | INTRAVENOUS | Status: AC
  Administered 2021-11-10: 240 mg via INTRAVENOUS
  Filled 2021-11-10: qty 24

## 2021-11-10 NOTE — Patient Instructions (Signed)
[facility]   Discharge Instructions: Thank you for choosing [facility] to provide your oncology and hematology care.   If you have a lab appointment with the [facility], please go directly to the [facility] and check in at the registration area.   Wear comfortable clothing and clothing appropriate for easy access to any Portacath or PICC line.   We strive to give you quality time with your provider. You may need to reschedule your appointment if you arrive late (15 or more minutes).  Arriving late affects you and other patients whose appointments are after yours.  Also, if you miss three or more appointments without notifying the office, you may be dismissed from the clinic at the provider's discretion.      For prescription refill requests, have your pharmacy contact our office and allow 72 hours for refills to be completed.    Today you received the following chemotherapy and/or immunotherapy agents Nivolumab (OPDIVO), Oxaliplatin (ELOXATIN), Leucovorin & Flourouracil (ADRUCIL).      To help prevent nausea and vomiting after your treatment, we encourage you to take your nausea medication as directed.  BELOW ARE SYMPTOMS THAT SHOULD BE REPORTED IMMEDIATELY: *FEVER GREATER THAN 100.4 F (38 C) OR HIGHER *CHILLS OR SWEATING *NAUSEA AND VOMITING THAT IS NOT CONTROLLED WITH YOUR NAUSEA MEDICATION *UNUSUAL SHORTNESS OF BREATH *UNUSUAL BRUISING OR BLEEDING *URINARY PROBLEMS (pain or burning when urinating, or frequent urination) *BOWEL PROBLEMS (unusual diarrhea, constipation, pain near the anus) TENDERNESS IN MOUTH AND THROAT WITH OR WITHOUT PRESENCE OF ULCERS (sore throat, sores in mouth, or a toothache) UNUSUAL RASH, SWELLING OR PAIN  UNUSUAL VAGINAL DISCHARGE OR ITCHING   Items with * indicate a potential emergency and should be followed up as soon as possible or go to the Emergency Department if any problems should occur.  Please show  the CHEMOTHERAPY ALERT CARD or IMMUNOTHERAPY ALERT CARD at check-in to the Emergency Department and triage nurse.  Should you have questions after your visit or need to cancel or reschedule your appointment, please contact [facility]  Dept: [phone]  and follow the prompts.  Office hours are 8:00 a.m. to 4:30 p.m. Monday - Friday. Please note that voicemails left after 4:00 p.m. may not be returned until the following business day.  We are closed weekends and major holidays. You have access to a nurse at all times for urgent questions. Please call the main number to the clinic Dept: [phone] and follow the prompts.   For any non-urgent questions, you may also contact your provider using MyChart. We now offer e-Visits for anyone 16 and older to request care online for non-urgent symptoms. For details visit [email or web address].   Also download the MyChart app! Go to the app store, search "MyChart", open the app, select [facility], and log in with your MyChart username and password.  Masks are optional in the cancer centers. If you would like for your care team to wear a mask while they are taking care of you, please let them know. You may have one support person who is at least 72 years old accompany you for your appointments.  Nivolumab Injection What is this medication? NIVOLUMAB (nye VOL ue mab) treats some types of cancer. It works by helping your immune system slow or stop the spread of cancer cells. It is a monoclonal antibody. This medicine may be used for other purposes; ask your health care provider or pharmacist if you  have questions. COMMON BRAND NAME(S): Opdivo What should I tell my care team before I take this medication? They need to know if you have any of these conditions: Allogeneic stem cell transplant (uses someone else's stem cells) Autoimmune diseases, such as Crohn disease, ulcerative colitis, lupus History of chest radiation Nervous  system problems, such as Guillain-Barre syndrome or myasthenia gravis Organ transplant An unusual or allergic reaction to nivolumab, other medications, foods, dyes, or preservatives Pregnant or trying to get pregnant Breast-feeding How should I use this medication? This medication is infused into a vein. It is given in a hospital or clinic setting. A special MedGuide will be given to you before each treatment. Be sure to read this information carefully each time. Talk to your care team about the use of this medication in children. While it may be prescribed for children as young as 12 years for selected conditions, precautions do apply. Overdosage: If you think you have taken too much of this medicine contact a poison control center or emergency room at once. NOTE: This medicine is only for you. Do not share this medicine with others. What if I miss a dose? Keep appointments for follow-up doses. It is important not to miss your dose. Call your care team if you are unable to keep an appointment. What may interact with this medication? Interactions have not been studied. This list may not describe all possible interactions. Give your health care provider a list of all the medicines, herbs, non-prescription drugs, or dietary supplements you use. Also tell them if you smoke, drink alcohol, or use illegal drugs. Some items may interact with your medicine. What should I watch for while using this medication? Your condition will be monitored carefully while you are receiving this medication. You may need blood work while taking this medication. This medication may cause serious skin reactions. They can happen weeks to months after starting the medication. Contact your care team right away if you notice fevers or flu-like symptoms with a rash. The rash may be red or purple and then turn into blisters or peeling of the skin. You may also notice a red rash with swelling of the face, lips, or lymph nodes in  your neck or under your arms. Tell your care team right away if you have any change in your eyesight. Talk to your care team if you are pregnant or think you might be pregnant. A negative pregnancy test is required before starting this medication. A reliable form of contraception is recommended while taking this medication and for 5 months after the last dose. Talk to your care team about effective forms of contraception. Do not breast-feed while taking this medication and for 5 months after the last dose. What side effects may I notice from receiving this medication? Side effects that you should report to your care team as soon as possible: Allergic reactions--skin rash, itching, hives, swelling of the face, lips, tongue, or throat Dry cough, shortness of breath or trouble breathing Eye pain, redness, irritation, or discharge with blurry or decreased vision Heart muscle inflammation--unusual weakness or fatigue, shortness of breath, chest pain, fast or irregular heartbeat, dizziness, swelling of the ankles, feet, or hands Hormone gland problems--headache, sensitivity to light, unusual weakness or fatigue, dizziness, fast or irregular heartbeat, increased sensitivity to cold or heat, excessive sweating, constipation, hair loss, increased thirst or amount of urine, tremors or shaking, irritability Infusion reactions--chest pain, shortness of breath or trouble breathing, feeling faint or lightheaded Kidney injury (glomerulonephritis)--decrease in  the amount of urine, red or dark brown urine, foamy or bubbly urine, swelling of the ankles, hands, or feet Liver injury--right upper belly pain, loss of appetite, nausea, light-colored stool, dark yellow or brown urine, yellowing skin or eyes, unusual weakness or fatigue Pain, tingling, or numbness in the hands or feet, muscle weakness, change in vision, confusion or trouble speaking, loss of balance or coordination, trouble walking, seizures Rash, fever, and  swollen lymph nodes Redness, blistering, peeling, or loosening of the skin, including inside the mouth Sudden or severe stomach pain, bloody diarrhea, fever, nausea, vomiting Side effects that usually do not require medical attention (report these to your care team if they continue or are bothersome): Bone, joint, or muscle pain Diarrhea Fatigue Loss of appetite Nausea Skin rash This list may not describe all possible side effects. Call your doctor for medical advice about side effects. You may report side effects to FDA at 1-800-FDA-1088. Where should I keep my medication? This medication is given in a hospital or clinic. It will not be stored at home. NOTE: This sheet is a summary. It may not cover all possible information. If you have questions about this medicine, talk to your doctor, pharmacist, or health care provider.  2023 Elsevier/Gold Standard (2020-12-12 00:00:00)  Oxaliplatin Injection What is this medication? OXALIPLATIN (ox AL i PLA tin) treats some types of cancer. It works by slowing down the growth of cancer cells. This medicine may be used for other purposes; ask your health care provider or pharmacist if you have questions. COMMON BRAND NAME(S): Eloxatin What should I tell my care team before I take this medication? They need to know if you have any of these conditions: Heart disease History of irregular heartbeat or rhythm Liver disease Low blood cell levels (white cells, red cells, and platelets) Lung or breathing disease, such as asthma Take medications that treat or prevent blood clots Tingling of the fingers, toes, or other nerve disorder An unusual or allergic reaction to oxaliplatin, other medications, foods, dyes, or preservatives If you or your partner are pregnant or trying to get pregnant Breast-feeding How should I use this medication? This medication is injected into a vein. It is given by your care team in a hospital or clinic setting. Talk to your  care team about the use of this medication in children. Special care may be needed. Overdosage: If you think you have taken too much of this medicine contact a poison control center or emergency room at once. NOTE: This medicine is only for you. Do not share this medicine with others. What if I miss a dose? Keep appointments for follow-up doses. It is important not to miss a dose. Call your care team if you are unable to keep an appointment. What may interact with this medication? Do not take this medication with any of the following: Cisapride Dronedarone Pimozide Thioridazine This medication may also interact with the following: Aspirin and aspirin-like medications Certain medications that treat or prevent blood clots, such as warfarin, apixaban, dabigatran, and rivaroxaban Cisplatin Cyclosporine Diuretics Medications for infection, such as acyclovir, adefovir, amphotericin B, bacitracin, cidofovir, foscarnet, ganciclovir, gentamicin, pentamidine, vancomycin NSAIDs, medications for pain and inflammation, such as ibuprofen or naproxen Other medications that cause heart rhythm changes Pamidronate Zoledronic acid This list may not describe all possible interactions. Give your health care provider a list of all the medicines, herbs, non-prescription drugs, or dietary supplements you use. Also tell them if you smoke, drink alcohol, or use illegal drugs. Some  items may interact with your medicine. What should I watch for while using this medication? Your condition will be monitored carefully while you are receiving this medication. You may need blood work while taking this medication. This medication may make you feel generally unwell. This is not uncommon as chemotherapy can affect healthy cells as well as cancer cells. Report any side effects. Continue your course of treatment even though you feel ill unless your care team tells you to stop. This medication may increase your risk of getting  an infection. Call your care team for advice if you get a fever, chills, sore throat, or other symptoms of a cold or flu. Do not treat yourself. Try to avoid being around people who are sick. Avoid taking medications that contain aspirin, acetaminophen, ibuprofen, naproxen, or ketoprofen unless instructed by your care team. These medications may hide a fever. Be careful brushing or flossing your teeth or using a toothpick because you may get an infection or bleed more easily. If you have any dental work done, tell your dentist you are receiving this medication. This medication can make you more sensitive to cold. Do not drink cold drinks or use ice. Cover exposed skin before coming in contact with cold temperatures or cold objects. When out in cold weather wear warm clothing and cover your mouth and nose to warm the air that goes into your lungs. Tell your care team if you get sensitive to the cold. Talk to your care team if you or your partner are pregnant or think either of you might be pregnant. This medication can cause serious birth defects if taken during pregnancy and for 9 months after the last dose. A negative pregnancy test is required before starting this medication. A reliable form of contraception is recommended while taking this medication and for 9 months after the last dose. Talk to your care team about effective forms of contraception. Do not father a child while taking this medication and for 6 months after the last dose. Use a condom while having sex during this time period. Do not breastfeed while taking this medication and for 3 months after the last dose. This medication may cause infertility. Talk to your care team if you are concerned about your fertility. What side effects may I notice from receiving this medication? Side effects that you should report to your care team as soon as possible: Allergic reactions--skin rash, itching, hives, swelling of the face, lips, tongue, or  throat Bleeding--bloody or black, tar-like stools, vomiting blood or brown material that looks like coffee grounds, red or dark brown urine, small red or purple spots on skin, unusual bruising or bleeding Dry cough, shortness of breath or trouble breathing Heart rhythm changes--fast or irregular heartbeat, dizziness, feeling faint or lightheaded, chest pain, trouble breathing Infection--fever, chills, cough, sore throat, wounds that don't heal, pain or trouble when passing urine, general feeling of discomfort or being unwell Liver injury--right upper belly pain, loss of appetite, nausea, light-colored stool, dark yellow or brown urine, yellowing skin or eyes, unusual weakness or fatigue Low red blood cell level--unusual weakness or fatigue, dizziness, headache, trouble breathing Muscle injury--unusual weakness or fatigue, muscle pain, dark yellow or brown urine, decrease in amount of urine Pain, tingling, or numbness in the hands or feet Sudden and severe headache, confusion, change in vision, seizures, which may be signs of posterior reversible encephalopathy syndrome (PRES) Unusual bruising or bleeding Side effects that usually do not require medical attention (report to your care team  if they continue or are bothersome): Diarrhea Nausea Pain, redness, or swelling with sores inside the mouth or throat Unusual weakness or fatigue Vomiting This list may not describe all possible side effects. Call your doctor for medical advice about side effects. You may report side effects to FDA at 1-800-FDA-1088. Where should I keep my medication? This medication is given in a hospital or clinic. It will not be stored at home. NOTE: This sheet is a summary. It may not cover all possible information. If you have questions about this medicine, talk to your doctor, pharmacist, or health care provider.  2023 Elsevier/Gold Standard (2021-05-08 00:00:00)  Leucovorin Injection What is this  medication? LEUCOVORIN (loo koe VOR in) prevents side effects from certain medications, such as methotrexate. It works by increasing folate levels. This helps protect healthy cells in your body. It may also be used to treat anemia caused by low levels of folate. It can also be used with fluorouracil, a type of chemotherapy, to treat colorectal cancer. It works by increasing the effects of fluorouracil in the body. This medicine may be used for other purposes; ask your health care provider or pharmacist if you have questions. What should I tell my care team before I take this medication? They need to know if you have any of these conditions: Anemia from low levels of vitamin B12 in the blood An unusual or allergic reaction to leucovorin, folic acid, other medications, foods, dyes, or preservatives Pregnant or trying to get pregnant Breastfeeding How should I use this medication? This medication is injected into a vein or a muscle. It is given by your care team in a hospital or clinic setting. Talk to your care team about the use of this medication in children. Special care may be needed. Overdosage: If you think you have taken too much of this medicine contact a poison control center or emergency room at once. NOTE: This medicine is only for you. Do not share this medicine with others. What if I miss a dose? Keep appointments for follow-up doses. It is important not to miss your dose. Call your care team if you are unable to keep an appointment. What may interact with this medication? Capecitabine Fluorouracil Phenobarbital Phenytoin Primidone Trimethoprim;sulfamethoxazole This list may not describe all possible interactions. Give your health care provider a list of all the medicines, herbs, non-prescription drugs, or dietary supplements you use. Also tell them if you smoke, drink alcohol, or use illegal drugs. Some items may interact with your medicine. What should I watch for while using  this medication? Your condition will be monitored carefully while you are receiving this medication. This medication may increase the side effects of 5-fluorouracil. Tell your care team if you have diarrhea or mouth sores that do not get better or that get worse. What side effects may I notice from receiving this medication? Side effects that you should report to your care team as soon as possible: Allergic reactions--skin rash, itching, hives, swelling of the face, lips, tongue, or throat This list may not describe all possible side effects. Call your doctor for medical advice about side effects. You may report side effects to FDA at 1-800-FDA-1088. Where should I keep my medication? This medication is given in a hospital or clinic. It will not be stored at home. NOTE: This sheet is a summary. It may not cover all possible information. If you have questions about this medicine, talk to your doctor, pharmacist, or health care provider.  2023 Elsevier/Gold Standard (  2021-05-22 00:00:00)  Fluorouracil Injection What is this medication? FLUOROURACIL (flure oh YOOR a sil) treats some types of cancer. It works by slowing down the growth of cancer cells. This medicine may be used for other purposes; ask your health care provider or pharmacist if you have questions. COMMON BRAND NAME(S): Adrucil What should I tell my care team before I take this medication? They need to know if you have any of these conditions: Blood disorders Dihydropyrimidine dehydrogenase (DPD) deficiency Infection, such as chickenpox, cold sores, herpes Kidney disease Liver disease Poor nutrition Recent or ongoing radiation therapy An unusual or allergic reaction to fluorouracil, other medications, foods, dyes, or preservatives If you or your partner are pregnant or trying to get pregnant Breast-feeding How should I use this medication? This medication is injected into a vein. It is administered by your care team in a  hospital or clinic setting. Talk to your care team about the use of this medication in children. Special care may be needed. Overdosage: If you think you have taken too much of this medicine contact a poison control center or emergency room at once. NOTE: This medicine is only for you. Do not share this medicine with others. What if I miss a dose? Keep appointments for follow-up doses. It is important not to miss your dose. Call your care team if you are unable to keep an appointment. What may interact with this medication? Do not take this medication with any of the following: Live virus vaccines This medication may also interact with the following: Medications that treat or prevent blood clots, such as warfarin, enoxaparin, dalteparin This list may not describe all possible interactions. Give your health care provider a list of all the medicines, herbs, non-prescription drugs, or dietary supplements you use. Also tell them if you smoke, drink alcohol, or use illegal drugs. Some items may interact with your medicine. What should I watch for while using this medication? Your condition will be monitored carefully while you are receiving this medication. This medication may make you feel generally unwell. This is not uncommon as chemotherapy can affect healthy cells as well as cancer cells. Report any side effects. Continue your course of treatment even though you feel ill unless your care team tells you to stop. In some cases, you may be given additional medications to help with side effects. Follow all directions for their use. This medication may increase your risk of getting an infection. Call your care team for advice if you get a fever, chills, sore throat, or other symptoms of a cold or flu. Do not treat yourself. Try to avoid being around people who are sick. This medication may increase your risk to bruise or bleed. Call your care team if you notice any unusual bleeding. Be careful brushing  or flossing your teeth or using a toothpick because you may get an infection or bleed more easily. If you have any dental work done, tell your dentist you are receiving this medication. Avoid taking medications that contain aspirin, acetaminophen, ibuprofen, naproxen, or ketoprofen unless instructed by your care team. These medications may hide a fever. Do not treat diarrhea with over the counter products. Contact your care team if you have diarrhea that lasts more than 2 days or if it is severe and watery. This medication can make you more sensitive to the sun. Keep out of the sun. If you cannot avoid being in the sun, wear protective clothing and sunscreen. Do not use sun lamps, tanning beds, or  tanning booths. Talk to your care team if you or your partner wish to become pregnant or think you might be pregnant. This medication can cause serious birth defects if taken during pregnancy and for 3 months after the last dose. A reliable form of contraception is recommended while taking this medication and for 3 months after the last dose. Talk to your care team about effective forms of contraception. Do not father a child while taking this medication and for 3 months after the last dose. Use a condom while having sex during this time period. Do not breastfeed while taking this medication. This medication may cause infertility. Talk to your care team if you are concerned about your fertility. What side effects may I notice from receiving this medication? Side effects that you should report to your care team as soon as possible: Allergic reactions--skin rash, itching, hives, swelling of the face, lips, tongue, or throat Heart attack--pain or tightness in the chest, shoulders, arms, or jaw, nausea, shortness of breath, cold or clammy skin, feeling faint or lightheaded Heart failure--shortness of breath, swelling of the ankles, feet, or hands, sudden weight gain, unusual weakness or fatigue Heart rhythm  changes--fast or irregular heartbeat, dizziness, feeling faint or lightheaded, chest pain, trouble breathing High ammonia level--unusual weakness or fatigue, confusion, loss of appetite, nausea, vomiting, seizures Infection--fever, chills, cough, sore throat, wounds that don't heal, pain or trouble when passing urine, general feeling of discomfort or being unwell Low red blood cell level--unusual weakness or fatigue, dizziness, headache, trouble breathing Pain, tingling, or numbness in the hands or feet, muscle weakness, change in vision, confusion or trouble speaking, loss of balance or coordination, trouble walking, seizures Redness, swelling, and blistering of the skin over hands and feet Severe or prolonged diarrhea Unusual bruising or bleeding Side effects that usually do not require medical attention (report to your care team if they continue or are bothersome): Dry skin Headache Increased tears Nausea Pain, redness, or swelling with sores inside the mouth or throat Sensitivity to light Vomiting This list may not describe all possible side effects. Call your doctor for medical advice about side effects. You may report side effects to FDA at 1-800-FDA-1088. Where should I keep my medication? This medication is given in a hospital or clinic. It will not be stored at home. NOTE: This sheet is a summary. It may not cover all possible information. If you have questions about this medicine, talk to your doctor, pharmacist, or health care provider.  2023 Elsevier/Gold Standard (2021-05-19 00:00:00)  The chemotherapy medication bag should finish at 46 hours, 96 hours, or 7 days. For example, if your pump is scheduled for 46 hours and it was put on at 4:00 p.m., it should finish at 2:00 p.m. the day it is scheduled to come off regardless of your appointment time.     Estimated time to finish at 11:30 on Thursday 11/12/2021.   If the display on your pump reads "Low Volume" and it is beeping,  take the batteries out of the pump and come to the cancer center for it to be taken off.   If the pump alarms go off prior to the pump reading "Low Volume" then call [phone] and someone can assist you.  If the plunger comes out and the chemotherapy medication is leaking out, please use your home chemo spill kit to clean up the spill. Do NOT use paper towels or other household products.  If you have problems or questions regarding your pump, please  call either [phone] (24 hours a day) or the cancer center Monday-Friday 8:00 a.m.- 4:30 p.m. at the clinic number and we will assist you. If you are unable to get assistance, then go to the nearest Emergency Department and ask the staff to contact the IV team for assistance.

## 2021-11-10 NOTE — Progress Notes (Signed)
  Leslie Living[facility] OFFICE PROGRESS NOTE   Diagnosis: Esophagus cancer  INTERVAL HISTORY:   Leslie Livingston completed another cycle of FOLFOX/nivolumab on 10/27/2021.  Mild nausea was relieved with Phenergan.  No mouth sores, diarrhea, or neuropathy symptoms.  He is tolerating liquids.  He was able to eat ice cream.  He continues tube feedings.  Objective:  Vital signs in last 24 hours:  Blood pressure 107/72, pulse 72, temperature 98.1 F (36.7 C), temperature source Oral, resp. rate 18, height 6' 1" (1.854 m), weight 203 lb 9.6 oz (92.4 kg), SpO2 98 %.    HEENT: No thrush or ulcers Lymphatics: No cervical or supraclavicular nodes Resp: Lungs clear bilaterally Cardio: Regular rate and rhythm GI: No hepatomegaly, left upper quadrant feeding tube site without evidence of infection Vascular: No leg edema  Skin: Palms without erythema  Portacath/PICC-without erythema  Lab Results:  Lab Results  Component Value Date   WBC 4.7 11/10/2021   HGB 11.8 (L) 11/10/2021   HCT 37.5 (L) 11/10/2021   MCV 89.3 11/10/2021   PLT 189 11/10/2021   NEUTROABS 2.3 11/10/2021    CMP  Lab Results  Component Value Date   NA 134 (L) 10/27/2021   K 4.2 10/27/2021   CL 97 (L) 10/27/2021   CO2 28 10/27/2021   GLUCOSE 128 (H) 10/27/2021   BUN 26 (H) 10/27/2021   CREATININE 0.94 10/27/2021   CALCIUM 10.1 10/27/2021   PROT 8.1 10/27/2021   ALBUMIN 3.5 10/27/2021   AST 27 10/27/2021   ALT 41 10/27/2021   ALKPHOS 148 (H) 10/27/2021   BILITOT 0.4 10/27/2021   GFRNONAA >60 10/27/2021   GFRAA >60 05/06/2018    Medications: I have reviewed the patient's current medications.   Assessment/Plan: Metastatic esophageal cancer CT neck 07/29/2021-adenopathy at the thoracic inlet and lower bilateral jugular chains, esophagus mass, left vocal cord paralysis CT chest 07/29/2021-diffuse esophageal wall thickening, most pronounced in the proximal esophagus, multiple pulmonary nodules, mediastinal and  supraclavicular lymphadenopathy Upper endoscopy 08/07/2021-mass/stricture extending from the UES to 30 cm CTs chest, abdomen, and pelvis 08/07/2021-long segment wall thickening of the esophagus, prominent proximally, multiple large supraclavicular mediastinal nodes, bilateral lung nodules,, and upper abdominal nodes similar to remote abdominal CT Biopsy 08/07/2021 showed invasive moderately differentiated squamous cell carcinoma Foundation 1-MSS, tumor mutation burden 7, PD-L1 CPS 10 Esophagus radiation 08/13/2021 - 09/03/2021 Cycle 1 FOLFOX/nivolumab 09/08/2021 Cycle 2 held 09/22/2021 due to neutropenia Cycle 2 FOLFOX/nivolumab 09/29/2021, Udenyca Cycle 3 FOLFOX/nivolumab 10/13/2021, Udenyca Cycle 4 FOLFOX/nivolumab 10/27/2021, Udenyca Cycle 5 FOLFOX/nivolumab 11/10/2021, Udenyca   Solid/liquid dysphagia secondary to #1 -Gastrostomy tube placement 07/31/2021 Weight loss secondary to #1 Odynophagia secondary to #1 History of colon polyps-tubular adenomas History of a diverticular bleed-April 2020 Anemia Thrombocytosis Left subclavian Port-A-Cath placement 08/10/2021 Left upper extremity edema -Doppler ultrasound of left upper extremity 08/13/2021-negative for DVT       Disposition: Leslie Livingston has completed 4 cycles of FOLFOX/nivolumab.  He has tolerated the treatment well.  He will complete cycle 5 today.  He will undergo a restaging CT after this cycle.  Dysphagia has improved.  He is starting to take more nutrition by mouth.  I will ask the Cancer center nutritionist to contact him to discuss weaning off of tube feedings.  Betsy Coder, MD  11/10/2021  8:57 AM

## 2021-11-10 NOTE — Progress Notes (Signed)
Nutrition referral order placed.

## 2021-11-10 NOTE — Progress Notes (Signed)
Patient seen by Dr. Sherrill today ? ?Vitals are within treatment parameters. ? ?Labs reviewed by Dr. Sherrill and are within treatment parameters. ? ?Per physician team, patient is ready for treatment and there are NO modifications to the treatment plan.  ?

## 2021-11-11 ENCOUNTER — Ambulatory Visit: Payer: Medicare PPO | Admitting: Dietician

## 2021-11-11 ENCOUNTER — Other Ambulatory Visit: Payer: Self-pay

## 2021-11-11 NOTE — Progress Notes (Signed)
Attempted to reach patient to follow up on what he can tolerate and how much volume by mouth so his tube feeds could be adjust with goal of weaning.  I wasn't able to eave a voice message but I sent my cell# through SMS and sent text.  April Manson, RDN, LDN Registered Dietitian, [place] Part Time Remote (Usual office hours: Tuesday-Thursday) Mobile: [phone] Remote Office: [phone]

## 2021-11-12 ENCOUNTER — Telehealth: Payer: Self-pay | Admitting: Dietician

## 2021-11-12 ENCOUNTER — Inpatient Hospital Stay: Payer: Medicare PPO

## 2021-11-12 VITALS — BP 102/73 | HR 95 | Temp 98.4°F | Resp 18

## 2021-11-12 DIAGNOSIS — Z5112 Encounter for antineoplastic immunotherapy: Secondary | ICD-10-CM | POA: Diagnosis not present

## 2021-11-12 DIAGNOSIS — C159 Malignant neoplasm of esophagus, unspecified: Secondary | ICD-10-CM

## 2021-11-12 MED ORDER — HEPARIN SOD (PORK) LOCK FLUSH 100 UNIT/ML IV SOLN
500.0000 [IU] | Freq: Once | INTRAVENOUS | Status: AC | PRN
  Administered 2021-11-12: 500 [IU]

## 2021-11-12 MED ORDER — PEGFILGRASTIM-CBQV 6 MG/0.6ML ~~LOC~~ SOSY
6.0000 mg | PREFILLED_SYRINGE | Freq: Once | SUBCUTANEOUS | Status: AC
  Administered 2021-11-12: 6 mg via SUBCUTANEOUS
  Filled 2021-11-12: qty 0.6

## 2021-11-12 MED ORDER — SODIUM CHLORIDE 0.9% FLUSH
10.0000 mL | INTRAVENOUS | Status: DC | PRN
  Administered 2021-11-12: 10 mL

## 2021-11-12 NOTE — Patient Instructions (Signed)

## 2021-11-12 NOTE — Telephone Encounter (Signed)
Attempted to reach patient's daughter to follow up on what patient can tolerate and how much volume by mouth so his tube feeds could be adjust with goal of weaning.  I hadn't heard back from patient [facility] on daughter's voice mail.  April Manson, RDN, LDN Registered Dietitian, [place] Part Time Remote (Usual office hours: Tuesday-Thursday) Mobile: [phone] Remote Office: [phone]

## 2021-11-16 ENCOUNTER — Telehealth: Payer: Self-pay

## 2021-11-16 NOTE — Telephone Encounter (Signed)
Patient 's daughter called and states her father have a mouth sore. Magic mouth wash was called to [facility] on [address]

## 2021-11-17 ENCOUNTER — Other Ambulatory Visit: Payer: Self-pay | Admitting: Nurse Practitioner

## 2021-11-17 ENCOUNTER — Other Ambulatory Visit (HOSPITAL_BASED_OUTPATIENT_CLINIC_OR_DEPARTMENT_OTHER): Payer: Self-pay

## 2021-11-17 ENCOUNTER — Ambulatory Visit (HOSPITAL_BASED_OUTPATIENT_CLINIC_OR_DEPARTMENT_OTHER)
Admission: RE | Admit: 2021-11-17 | Discharge: 2021-11-17 | Disposition: A | Discharge status: Home / Self Care | Payer: Medicare PPO | Source: Ambulatory Visit | Attending: Oncology | Admitting: Oncology

## 2021-11-17 ENCOUNTER — Other Ambulatory Visit: Payer: Self-pay | Admitting: Oncology

## 2021-11-17 ENCOUNTER — Encounter (HOSPITAL_BASED_OUTPATIENT_CLINIC_OR_DEPARTMENT_OTHER): Payer: Self-pay

## 2021-11-17 DIAGNOSIS — C153 Malignant neoplasm of upper third of esophagus: Secondary | ICD-10-CM

## 2021-11-17 DIAGNOSIS — C159 Malignant neoplasm of esophagus, unspecified: Secondary | ICD-10-CM | POA: Diagnosis present

## 2021-11-17 MED ORDER — OXYCODONE HCL 5 MG/5ML PO SOLN
5.0000 mg | Freq: Four times a day (QID) | ORAL | 0 refills | Status: DC | PRN
  Filled 2021-11-17: qty 200, 5d supply, fill #0

## 2021-11-17 MED ORDER — IOHEXOL 300 MG/ML  SOLN
100.0000 mL | Freq: Once | INTRAMUSCULAR | Status: AC | PRN
  Administered 2021-11-17: 75 mL via INTRAVENOUS

## 2021-11-19 ENCOUNTER — Telehealth: Payer: Self-pay

## 2021-11-19 NOTE — Telephone Encounter (Signed)
Mr. Roussel daughter called and ask for a nurse to her call back. No answer I call Mrs. Neal back 3x . I left a message states I was returned her call and to call us back.

## 2021-11-20 ENCOUNTER — Telehealth: Payer: Self-pay | Admitting: *Deleted

## 2021-11-20 NOTE — Telephone Encounter (Signed)
Daughter received call from office and she returned call. Informed her it was most likely a reminder call for his appointment on 10/31. She inquired about CT scan and informed her it shows a mixed response to treatment. MD will review in detail and show images at his appointment.

## 2021-11-22 ENCOUNTER — Other Ambulatory Visit: Payer: Self-pay | Admitting: Oncology

## 2021-11-23 ENCOUNTER — Telehealth: Payer: Self-pay

## 2021-11-23 ENCOUNTER — Other Ambulatory Visit (HOSPITAL_BASED_OUTPATIENT_CLINIC_OR_DEPARTMENT_OTHER): Payer: Self-pay

## 2021-11-23 NOTE — Telephone Encounter (Signed)
Nutrition  3rd attempt Called patient's daughter Leslie Livingston for nutrition follow-up.  No answer.  Left message with call back number.  Maleya Leever B. Zenia Resides, [place], [place] Registered Dietitian [phone]

## 2021-11-24 ENCOUNTER — Encounter: Payer: Self-pay | Admitting: Nurse Practitioner

## 2021-11-24 ENCOUNTER — Inpatient Hospital Stay: Payer: Medicare PPO | Admitting: Nurse Practitioner

## 2021-11-24 ENCOUNTER — Inpatient Hospital Stay: Payer: Medicare PPO

## 2021-11-24 ENCOUNTER — Other Ambulatory Visit (HOSPITAL_BASED_OUTPATIENT_CLINIC_OR_DEPARTMENT_OTHER): Payer: Self-pay

## 2021-11-24 ENCOUNTER — Inpatient Hospital Stay: Payer: Medicare PPO | Admitting: Licensed Clinical Social Worker

## 2021-11-24 VITALS — BP 105/65 | HR 100 | Temp 98.1°F | Resp 18 | Ht 73.0 in | Wt 206.8 lb

## 2021-11-24 VITALS — BP 119/68 | HR 89

## 2021-11-24 DIAGNOSIS — C159 Malignant neoplasm of esophagus, unspecified: Secondary | ICD-10-CM | POA: Diagnosis not present

## 2021-11-24 DIAGNOSIS — C153 Malignant neoplasm of upper third of esophagus: Secondary | ICD-10-CM | POA: Diagnosis not present

## 2021-11-24 DIAGNOSIS — Z5112 Encounter for antineoplastic immunotherapy: Secondary | ICD-10-CM | POA: Diagnosis not present

## 2021-11-24 LAB — CBC WITH DIFFERENTIAL (CANCER CENTER ONLY)
Abs Immature Granulocytes: 0.02 10*3/uL (ref 0.00–0.07)
Basophils Absolute: 0.1 10*3/uL (ref 0.0–0.1)
Basophils Relative: 1 %
Eosinophils Absolute: 0.1 10*3/uL (ref 0.0–0.5)
Eosinophils Relative: 3 %
HCT: 37 % — ABNORMAL LOW (ref 39.0–52.0)
Hemoglobin: 11.7 g/dL — ABNORMAL LOW (ref 13.0–17.0)
Immature Granulocytes: 1 %
Lymphocytes Relative: 28 %
Lymphs Abs: 1.1 10*3/uL (ref 0.7–4.0)
MCH: 28.5 pg (ref 26.0–34.0)
MCHC: 31.6 g/dL (ref 30.0–36.0)
MCV: 90.2 fL (ref 80.0–100.0)
Monocytes Absolute: 0.7 10*3/uL (ref 0.1–1.0)
Monocytes Relative: 18 %
Neutro Abs: 1.8 10*3/uL (ref 1.7–7.7)
Neutrophils Relative %: 49 %
Platelet Count: 176 10*3/uL (ref 150–400)
RBC: 4.1 MIL/uL — ABNORMAL LOW (ref 4.22–5.81)
RDW: 16.6 % — ABNORMAL HIGH (ref 11.5–15.5)
WBC Count: 3.7 10*3/uL — ABNORMAL LOW (ref 4.0–10.5)
nRBC: 0 % (ref 0.0–0.2)

## 2021-11-24 LAB — CMP (CANCER CENTER ONLY)
ALT: 16 U/L (ref 0–44)
AST: 21 U/L (ref 15–41)
Albumin: 3.3 g/dL — ABNORMAL LOW (ref 3.5–5.0)
Alkaline Phosphatase: 124 U/L (ref 38–126)
Anion gap: 10 (ref 5–15)
BUN: 22 mg/dL (ref 8–23)
CO2: 26 mmol/L (ref 22–32)
Calcium: 10 mg/dL (ref 8.9–10.3)
Chloride: 100 mmol/L (ref 98–111)
Creatinine: 0.96 mg/dL (ref 0.61–1.24)
GFR, Estimated: >60 mL/min (ref >60)
Glucose, Bld: 148 mg/dL — ABNORMAL HIGH (ref 70–99)
Potassium: 4.1 mmol/L (ref 3.5–5.1)
Sodium: 136 mmol/L (ref 135–145)
Total Bilirubin: 0.5 mg/dL (ref 0.3–1.2)
Total Protein: 7.2 g/dL (ref 6.5–8.1)

## 2021-11-24 MED ORDER — FLUOROURACIL CHEMO INJECTION 2.5 GM/50ML
400.0000 mg/m2 | Freq: Once | INTRAVENOUS | Status: AC
  Administered 2021-11-24: 800 mg via INTRAVENOUS
  Filled 2021-11-24: qty 16

## 2021-11-24 MED ORDER — SODIUM CHLORIDE 0.9 % IV SOLN
240.0000 mg | Freq: Once | INTRAVENOUS | Status: AC
  Administered 2021-11-24: 240 mg via INTRAVENOUS
  Filled 2021-11-24: qty 24

## 2021-11-24 MED ORDER — OXALIPLATIN CHEMO INJECTION 100 MG/20ML
85.0000 mg/m2 | Freq: Once | INTRAVENOUS | Status: AC
  Administered 2021-11-24: 175 mg via INTRAVENOUS
  Filled 2021-11-24: qty 35

## 2021-11-24 MED ORDER — SODIUM CHLORIDE 0.9 % IV SOLN
2000.0000 mg/m2 | INTRAVENOUS | Status: DC
  Administered 2021-11-24: 4050 mg via INTRAVENOUS
  Filled 2021-11-24: qty 81

## 2021-11-24 MED ORDER — OXYCODONE HCL 5 MG/5ML PO SOLN
5.0000 mg | Freq: Four times a day (QID) | ORAL | 0 refills | Status: DC | PRN
  Filled 2021-11-24: qty 200, 5d supply, fill #0

## 2021-11-24 MED ORDER — LEUCOVORIN CALCIUM INJECTION 350 MG
400.0000 mg/m2 | Freq: Once | INTRAVENOUS | Status: AC
  Administered 2021-11-24: 812 mg via INTRAVENOUS
  Filled 2021-11-24: qty 25

## 2021-11-24 MED ORDER — DEXTROSE 5 % IV SOLN: Freq: Once | INTRAVENOUS | Status: AC

## 2021-11-24 MED ORDER — SODIUM CHLORIDE 0.9 % IV SOLN
10.0000 mg | Freq: Once | INTRAVENOUS | Status: AC
  Administered 2021-11-24: 10 mg via INTRAVENOUS
  Filled 2021-11-24: qty 10

## 2021-11-24 MED ORDER — PALONOSETRON HCL INJECTION 0.25 MG/5ML
0.2500 mg | Freq: Once | INTRAVENOUS | Status: AC
  Administered 2021-11-24: 0.25 mg via INTRAVENOUS
  Filled 2021-11-24: qty 5

## 2021-11-24 NOTE — Progress Notes (Signed)
[name] OFFICE PROGRESS NOTE   Diagnosis: Esophagus cancer  INTERVAL HISTORY:   [name] returns as scheduled.  He completed cycle 5 FOLFOX/nivolumab 11/10/2021.  He denies nausea/vomiting.  No mouth sores.  No diarrhea.  He denies cold sensitivity.  No numbness or tingling in the hands or feet today.  No rash.  Periodic jaw pain.  He is tolerating some very soft foods.  He occasionally takes a sip of water.  He continues tube feedings.  He takes pain medication 2-3 times a day.  Objective:  Vital signs in last 24 hours:  Blood pressure 105/65, pulse 100, temperature 98.1 F (36.7 C), temperature source Oral, resp. rate 18, height _0  (1.854 m), weight 206 lb 12.8 oz (93.8 kg), SpO2 98 %.    HEENT: Mild white coating over tongue. Resp: Lungs clear bilaterally. Cardio: Regular rate and rhythm. GI: Abdomen soft and nontender.  No hepatomegaly.  Left upper quadrant feeding tube site without evidence of infection. Vascular: No leg edema. Neuro: Vibratory sense mildly decreased over the fingertips per tuning fork exam. Skin: Palms without erythema. Port-A-Cath without erythema.   Lab Results:  Lab Results  Component Value Date   WBC 3.7 (L) 11/24/2021   HGB 11.7 (L) 11/24/2021   HCT 37.0 (L) 11/24/2021   MCV 90.2 11/24/2021   PLT 176 11/24/2021   NEUTROABS 1.8 11/24/2021    Imaging:  No results found.  Medications: I have reviewed the patient's current medications.  Assessment/Plan: Metastatic esophageal cancer CT neck 07/29/2021-adenopathy at the thoracic inlet and lower bilateral jugular chains, esophagus mass, left vocal cord paralysis CT chest 07/29/2021-diffuse esophageal wall thickening, most pronounced in the proximal esophagus, multiple pulmonary nodules, mediastinal and supraclavicular lymphadenopathy Upper endoscopy 08/07/2021-mass/stricture extending from the UES to 30 cm CTs chest, abdomen, and pelvis 08/07/2021-long segment wall  thickening of the esophagus, prominent proximally, multiple large supraclavicular mediastinal nodes, bilateral lung nodules,, and upper abdominal nodes similar to remote abdominal CT Biopsy 08/07/2021 showed invasive moderately differentiated squamous cell carcinoma Foundation 1-MSS, tumor mutation burden 7, PD-L1 CPS 10 Esophagus radiation 08/13/2021 - 09/03/2021 Cycle 1 FOLFOX/nivolumab 09/08/2021 Cycle 2 held 09/22/2021 due to neutropenia Cycle 2 FOLFOX/nivolumab 09/29/2021, Udenyca Cycle 3 FOLFOX/nivolumab 10/13/2021, Udenyca Cycle 4 FOLFOX/nivolumab 10/27/2021, Udenyca Cycle 5 FOLFOX/nivolumab 11/10/2021, Udenyca CTs 11/17/2021-unchanged long segment ill-defined circumferential wall thickening of the esophagus; multiple bilateral cavitary pulmonary nodules are slightly diminished in size; increase in size of a nodule in the perihilar posterior left upper lobe with associated postobstructive airspace disease; unchanged enlarged high right paratracheal and low right paraesophageal lymph node; interval sclerosis of multiple osseous metastatic lesions Cycle 6 FOLFOX/nivolumab 11/24/2021, Udenyca   Solid/liquid dysphagia secondary to #1 -Gastrostomy tube placement 07/31/2021 Weight loss secondary to #1 Odynophagia secondary to #1 History of colon polyps-tubular adenomas History of a diverticular bleed-April 2020 Anemia Thrombocytosis Left subclavian Port-A-Cath placement 08/10/2021 Left upper extremity edema -Doppler ultrasound of left upper extremity 08/13/2021-negative for DVT      Disposition: Leslie Livingston appears stable.  He has completed 5 cycles of FOLFOX/nivolumab.  Clinical status continues to be improved.  Recent restaging CTs overall improved.  Results/images reviewed with Leslie Livingston and his daughter.  They agree with the recommendation to continue FOLFOX/nivolumab.  Plan to proceed with cycle 6 today as scheduled.  CBC and chemistry panel reviewed.  Labs adequate to proceed as  above.  New prescription for oxycodone sent to his pharmacy.  He will return for follow-up in 2 weeks.  Patient seen  with Dr. Benay Spice.    Ned Card ANP/GNP-BC   11/24/2021  9:04 AM This was a shared visit with Ned Card.  We reviewed the restaging CT findings and images with Leslie Livingston and his daughter.  The overall pattern is consistent with a response to therapy.  We recommend continuing FOLFOX/nivolumab.  He agrees.  I was present for greater than 50% of today's visit.  I performed medical decision making.  Julieanne Manson, MD

## 2021-11-24 NOTE — Patient Instructions (Addendum)
Leslie Livingston   The chemotherapy medication bag should finish at 46 hours, 96 hours, or 7 days. For example, if your pump is scheduled for 46 hours and it was put on at 4:00 p.m., it should finish at 2:00 p.m. the day it is scheduled to come off regardless of your appointment time.     Estimated time to finish at 12:00 Thursday, November 26, 2021.   If the display on your pump reads "Low Volume" and it is beeping, take the batteries out of the pump and come to the cancer center for it to be taken off.   If the pump alarms go off prior to the pump reading "Low Volume" then call [phone] and someone can assist you.  If the plunger comes out and the chemotherapy medication is leaking out, please use your home chemo spill kit to clean up the spill. Do NOT use paper towels or other household products.  If you have problems or questions regarding your pump, please call either [phone] (24 hours a day) or the cancer center Monday-Friday 8:00 a.m.- 4:30 p.m. at the clinic number and we will assist you. If you are unable to get assistance, then go to the nearest Emergency Department and ask the staff to contact the IV team for assistance.  Discharge Instructions: Thank you for choosing [facility] to provide your oncology and hematology care.   If you have a lab appointment with the [facility], please go directly to the [facility] and check in at the registration area.   Wear comfortable clothing and clothing appropriate for easy access to any Portacath or PICC line.   We strive to give you quality time with your provider. You may need to reschedule your appointment if you arrive late (15 or more minutes).  Arriving late affects you and other patients whose appointments are after yours.  Also, if you miss three or more appointments without notifying the office, you may be dismissed from the clinic at the provider's discretion.      For prescription  refill requests, have your pharmacy contact our office and allow 72 hours for refills to be completed.    Today you received the following chemotherapy and/or immunotherapy agents Opdivo, Oxaliplatin, LEUCOVORIN, flUOROURACIL      To help prevent nausea and vomiting after your treatment, we encourage you to take your nausea medication as directed.  BELOW ARE SYMPTOMS THAT SHOULD BE REPORTED IMMEDIATELY: *FEVER GREATER THAN 100.4 F (38 C) OR HIGHER *CHILLS OR SWEATING *NAUSEA AND VOMITING THAT IS NOT CONTROLLED WITH YOUR NAUSEA MEDICATION *UNUSUAL SHORTNESS OF BREATH *UNUSUAL BRUISING OR BLEEDING *URINARY PROBLEMS (pain or burning when urinating, or frequent urination) *BOWEL PROBLEMS (unusual diarrhea, constipation, pain near the anus) TENDERNESS IN MOUTH AND THROAT WITH OR WITHOUT PRESENCE OF ULCERS (sore throat, sores in mouth, or a toothache) UNUSUAL RASH, SWELLING OR PAIN  UNUSUAL VAGINAL DISCHARGE OR ITCHING   Items with * indicate a potential emergency and should be followed up as soon as possible or go to the Emergency Department if any problems should occur.  Please show the CHEMOTHERAPY ALERT CARD or IMMUNOTHERAPY ALERT CARD at check-in to the Emergency Department and triage nurse.  Should you have questions after your visit or need to cancel or reschedule your appointment, please contact [facility]  Dept: [phone]  and follow the prompts.  Office hours are 8:00 a.m. to 4:30 p.m. Monday - Friday. Please note that  voicemails left after 4:00 p.m. may not be returned until the following business day.  We are closed weekends and major holidays. You have access to a nurse at all times for urgent questions. Please call the main number to the clinic Dept: [phone] and follow the prompts.   For any non-urgent questions, you may also contact your provider using MyChart. We now offer e-Visits for anyone 40 and older to request care online for non-urgent  symptoms. For details visit [email or web address].   Also download the MyChart app! Go to the app store, search "MyChart", open the app, select [facility], and log in with your MyChart username and password.  Masks are optional in the cancer centers. If you would like for your care team to wear a mask while they are taking care of you, please let them know. You may have one support person who is at least 72 years old accompany you for your appointments.  Nivolumab Injection What is this medication? NIVOLUMAB (nye VOL ue mab) treats some types of cancer. It works by helping your immune system slow or stop the spread of cancer cells. It is a monoclonal antibody. This medicine may be used for other purposes; ask your health care provider or pharmacist if you have questions. COMMON BRAND NAME(S): Opdivo What should I tell my care team before I take this medication? They need to know if you have any of these conditions: Allogeneic stem cell transplant (uses someone else's stem cells) Autoimmune diseases, such as Crohn disease, ulcerative colitis, lupus History of chest radiation Nervous system problems, such as Guillain-Barre syndrome or myasthenia gravis Organ transplant An unusual or allergic reaction to nivolumab, other medications, foods, dyes, or preservatives Pregnant or trying to get pregnant Breast-feeding How should I use this medication? This medication is infused into a vein. It is given in a hospital or clinic setting. A special MedGuide will be given to you before each treatment. Be sure to read this information carefully each time. Talk to your care team about the use of this medication in children. While it may be prescribed for children as young as 12 years for selected conditions, precautions do apply. Overdosage: If you think you have taken too much of this medicine contact a poison control center or emergency room at once. NOTE: This medicine is only for you. Do not share  this medicine with others. What if I miss a dose? Keep appointments for follow-up doses. It is important not to miss your dose. Call your care team if you are unable to keep an appointment. What may interact with this medication? Interactions have not been studied. This list may not describe all possible interactions. Give your health care provider a list of all the medicines, herbs, non-prescription drugs, or dietary supplements you use. Also tell them if you smoke, drink alcohol, or use illegal drugs. Some items may interact with your medicine. What should I watch for while using this medication? Your condition will be monitored carefully while you are receiving this medication. You may need blood work while taking this medication. This medication may cause serious skin reactions. They can happen weeks to months after starting the medication. Contact your care team right away if you notice fevers or flu-like symptoms with a rash. The rash may be red or purple and then turn into blisters or peeling of the skin. You may also notice a red rash with swelling of the face, lips, or lymph nodes in your neck or under your arms. Tell  your care team right away if you have any change in your eyesight. Talk to your care team if you are pregnant or think you might be pregnant. A negative pregnancy test is required before starting this medication. A reliable form of contraception is recommended while taking this medication and for 5 months after the last dose. Talk to your care team about effective forms of contraception. Do not breast-feed while taking this medication and for 5 months after the last dose. What side effects may I notice from receiving this medication? Side effects that you should report to your care team as soon as possible: Allergic reactions--skin rash, itching, hives, swelling of the face, lips, tongue, or throat Dry cough, shortness of breath or trouble breathing Eye pain, redness,  irritation, or discharge with blurry or decreased vision Heart muscle inflammation--unusual weakness or fatigue, shortness of breath, chest pain, fast or irregular heartbeat, dizziness, swelling of the ankles, feet, or hands Hormone gland problems--headache, sensitivity to light, unusual weakness or fatigue, dizziness, fast or irregular heartbeat, increased sensitivity to cold or heat, excessive sweating, constipation, hair loss, increased thirst or amount of urine, tremors or shaking, irritability Infusion reactions--chest pain, shortness of breath or trouble breathing, feeling faint or lightheaded Kidney injury (glomerulonephritis)--decrease in the amount of urine, red or dark brown urine, foamy or bubbly urine, swelling of the ankles, hands, or feet Liver injury--right upper belly pain, loss of appetite, nausea, light-colored stool, dark yellow or brown urine, yellowing skin or eyes, unusual weakness or fatigue Pain, tingling, or numbness in the hands or feet, muscle weakness, change in vision, confusion or trouble speaking, loss of balance or coordination, trouble walking, seizures Rash, fever, and swollen lymph nodes Redness, blistering, peeling, or loosening of the skin, including inside the mouth Sudden or severe stomach pain, bloody diarrhea, fever, nausea, vomiting Side effects that usually do not require medical attention (report these to your care team if they continue or are bothersome): Bone, joint, or muscle pain Diarrhea Fatigue Loss of appetite Nausea Skin rash This list may not describe all possible side effects. Call your doctor for medical advice about side effects. You may report side effects to FDA at 1-800-FDA-1088. Where should I keep my medication? This medication is given in a hospital or clinic. It will not be stored at home. NOTE: This sheet is a summary. It may not cover all possible information. If you have questions about this medicine, talk to your doctor,  pharmacist, or health care provider.  2023 Elsevier/Gold Standard (2021-05-11 00:00:00)  Oxaliplatin Injection What is this medication? OXALIPLATIN (ox AL i PLA tin) treats some types of cancer. It works by slowing down the growth of cancer cells. This medicine may be used for other purposes; ask your health care provider or pharmacist if you have questions. COMMON BRAND NAME(S): Eloxatin What should I tell my care team before I take this medication? They need to know if you have any of these conditions: Heart disease History of irregular heartbeat or rhythm Liver disease Low blood cell levels (white cells, red cells, and platelets) Lung or breathing disease, such as asthma Take medications that treat or prevent blood clots Tingling of the fingers, toes, or other nerve disorder An unusual or allergic reaction to oxaliplatin, other medications, foods, dyes, or preservatives If you or your partner are pregnant or trying to get pregnant Breast-feeding How should I use this medication? This medication is injected into a vein. It is given by your care team in a hospital or  clinic setting. Talk to your care team about the use of this medication in children. Special care may be needed. Overdosage: If you think you have taken too much of this medicine contact a poison control center or emergency room at once. NOTE: This medicine is only for you. Do not share this medicine with others. What if I miss a dose? Keep appointments for follow-up doses. It is important not to miss a dose. Call your care team if you are unable to keep an appointment. What may interact with this medication? Do not take this medication with any of the following: Cisapride Dronedarone Pimozide Thioridazine This medication may also interact with the following: Aspirin and aspirin-like medications Certain medications that treat or prevent blood clots, such as warfarin, apixaban, dabigatran, and  rivaroxaban Cisplatin Cyclosporine Diuretics Medications for infection, such as acyclovir, adefovir, amphotericin B, bacitracin, cidofovir, foscarnet, ganciclovir, gentamicin, pentamidine, vancomycin NSAIDs, medications for pain and inflammation, such as ibuprofen or naproxen Other medications that cause heart rhythm changes Pamidronate Zoledronic acid This list may not describe all possible interactions. Give your health care provider a list of all the medicines, herbs, non-prescription drugs, or dietary supplements you use. Also tell them if you smoke, drink alcohol, or use illegal drugs. Some items may interact with your medicine. What should I watch for while using this medication? Your condition will be monitored carefully while you are receiving this medication. You may need blood work while taking this medication. This medication may make you feel generally unwell. This is not uncommon as chemotherapy can affect healthy cells as well as cancer cells. Report any side effects. Continue your course of treatment even though you feel ill unless your care team tells you to stop. This medication may increase your risk of getting an infection. Call your care team for advice if you get a fever, chills, sore throat, or other symptoms of a cold or flu. Do not treat yourself. Try to avoid being around people who are sick. Avoid taking medications that contain aspirin, acetaminophen, ibuprofen, naproxen, or ketoprofen unless instructed by your care team. These medications may hide a fever. Be careful brushing or flossing your teeth or using a toothpick because you may get an infection or bleed more easily. If you have any dental work done, tell your dentist you are receiving this medication. This medication can make you more sensitive to cold. Do not drink cold drinks or use ice. Cover exposed skin before coming in contact with cold temperatures or cold objects. When out in cold weather wear warm clothing  and cover your mouth and nose to warm the air that goes into your lungs. Tell your care team if you get sensitive to the cold. Talk to your care team if you or your partner are pregnant or think either of you might be pregnant. This medication can cause serious birth defects if taken during pregnancy and for 9 months after the last dose. A negative pregnancy test is required before starting this medication. A reliable form of contraception is recommended while taking this medication and for 9 months after the last dose. Talk to your care team about effective forms of contraception. Do not father a child while taking this medication and for 6 months after the last dose. Use a condom while having sex during this time period. Do not breastfeed while taking this medication and for 3 months after the last dose. This medication may cause infertility. Talk to your care team if you are concerned about  your fertility. What side effects may I notice from receiving this medication? Side effects that you should report to your care team as soon as possible: Allergic reactions--skin rash, itching, hives, swelling of the face, lips, tongue, or throat Bleeding--bloody or black, tar-like stools, vomiting blood or brown material that looks like coffee grounds, red or dark brown urine, small red or purple spots on skin, unusual bruising or bleeding Dry cough, shortness of breath or trouble breathing Heart rhythm changes--fast or irregular heartbeat, dizziness, feeling faint or lightheaded, chest pain, trouble breathing Infection--fever, chills, cough, sore throat, wounds that don't heal, pain or trouble when passing urine, general feeling of discomfort or being unwell Liver injury--right upper belly pain, loss of appetite, nausea, light-colored stool, dark yellow or brown urine, yellowing skin or eyes, unusual weakness or fatigue Low red blood cell level--unusual weakness or fatigue, dizziness, headache, trouble  breathing Muscle injury--unusual weakness or fatigue, muscle pain, dark yellow or brown urine, decrease in amount of urine Pain, tingling, or numbness in the hands or feet Sudden and severe headache, confusion, change in vision, seizures, which may be signs of posterior reversible encephalopathy syndrome (PRES) Unusual bruising or bleeding Side effects that usually do not require medical attention (report to your care team if they continue or are bothersome): Diarrhea Nausea Pain, redness, or swelling with sores inside the mouth or throat Unusual weakness or fatigue Vomiting This list may not describe all possible side effects. Call your doctor for medical advice about side effects. You may report side effects to FDA at 1-800-FDA-1088. Where should I keep my medication? This medication is given in a hospital or clinic. It will not be stored at home. NOTE: This sheet is a summary. It may not cover all possible information. If you have questions about this medicine, talk to your doctor, pharmacist, or health care provider.  2023 Elsevier/Gold Standard (2007-03-04 00:00:00)  Leucovorin Injection What is this medication? LEUCOVORIN (loo koe VOR in) prevents side effects from certain medications, such as methotrexate. It works by increasing folate levels. This helps protect healthy cells in your body. It may also be used to treat anemia caused by low levels of folate. It can also be used with fluorouracil, a type of chemotherapy, to treat colorectal cancer. It works by increasing the effects of fluorouracil in the body. This medicine may be used for other purposes; ask your health care provider or pharmacist if you have questions. What should I tell my care team before I take this medication? They need to know if you have any of these conditions: Anemia from low levels of vitamin B12 in the blood An unusual or allergic reaction to leucovorin, folic acid, other medications, foods, dyes, or  preservatives Pregnant or trying to get pregnant Breastfeeding How should I use this medication? This medication is injected into a vein or a muscle. It is given by your care team in a hospital or clinic setting. Talk to your care team about the use of this medication in children. Special care may be needed. Overdosage: If you think you have taken too much of this medicine contact a poison control center or emergency room at once. NOTE: This medicine is only for you. Do not share this medicine with others. What if I miss a dose? Keep appointments for follow-up doses. It is important not to miss your dose. Call your care team if you are unable to keep an appointment. What may interact with this medication? Capecitabine Fluorouracil Phenobarbital Phenytoin Primidone Trimethoprim;sulfamethoxazole This  list may not describe all possible interactions. Give your health care provider a list of all the medicines, herbs, non-prescription drugs, or dietary supplements you use. Also tell them if you smoke, drink alcohol, or use illegal drugs. Some items may interact with your medicine. What should I watch for while using this medication? Your condition will be monitored carefully while you are receiving this medication. This medication may increase the side effects of 5-fluorouracil. Tell your care team if you have diarrhea or mouth sores that do not get better or that get worse. What side effects may I notice from receiving this medication? Side effects that you should report to your care team as soon as possible: Allergic reactions--skin rash, itching, hives, swelling of the face, lips, tongue, or throat This list may not describe all possible side effects. Call your doctor for medical advice about side effects. You may report side effects to FDA at 1-800-FDA-1088. Where should I keep my medication? This medication is given in a hospital or clinic. It will not be stored at home. NOTE: This sheet is  a summary. It may not cover all possible information. If you have questions about this medicine, talk to your doctor, pharmacist, or health care provider.  2023 Elsevier/Gold Standard (2021-06-16 00:00:00)  Fluorouracil Injection What is this medication? FLUOROURACIL (flure oh YOOR a sil) treats some types of cancer. It works by slowing down the growth of cancer cells. This medicine may be used for other purposes; ask your health care provider or pharmacist if you have questions. COMMON BRAND NAME(S): Adrucil What should I tell my care team before I take this medication? They need to know if you have any of these conditions: Blood disorders Dihydropyrimidine dehydrogenase (DPD) deficiency Infection, such as chickenpox, cold sores, herpes Kidney disease Liver disease Poor nutrition Recent or ongoing radiation therapy An unusual or allergic reaction to fluorouracil, other medications, foods, dyes, or preservatives If you or your partner are pregnant or trying to get pregnant Breast-feeding How should I use this medication? This medication is injected into a vein. It is administered by your care team in a hospital or clinic setting. Talk to your care team about the use of this medication in children. Special care may be needed. Overdosage: If you think you have taken too much of this medicine contact a poison control center or emergency room at once. NOTE: This medicine is only for you. Do not share this medicine with others. What if I miss a dose? Keep appointments for follow-up doses. It is important not to miss your dose. Call your care team if you are unable to keep an appointment. What may interact with this medication? Do not take this medication with any of the following: Live virus vaccines This medication may also interact with the following: Medications that treat or prevent blood clots, such as warfarin, enoxaparin, dalteparin This list may not describe all possible  interactions. Give your health care provider a list of all the medicines, herbs, non-prescription drugs, or dietary supplements you use. Also tell them if you smoke, drink alcohol, or use illegal drugs. Some items may interact with your medicine. What should I watch for while using this medication? Your condition will be monitored carefully while you are receiving this medication. This medication may make you feel generally unwell. This is not uncommon as chemotherapy can affect healthy cells as well as cancer cells. Report any side effects. Continue your course of treatment even though you feel ill unless your care team  tells you to stop. In some cases, you may be given additional medications to help with side effects. Follow all directions for their use. This medication may increase your risk of getting an infection. Call your care team for advice if you get a fever, chills, sore throat, or other symptoms of a cold or flu. Do not treat yourself. Try to avoid being around people who are sick. This medication may increase your risk to bruise or bleed. Call your care team if you notice any unusual bleeding. Be careful brushing or flossing your teeth or using a toothpick because you may get an infection or bleed more easily. If you have any dental work done, tell your dentist you are receiving this medication. Avoid taking medications that contain aspirin, acetaminophen, ibuprofen, naproxen, or ketoprofen unless instructed by your care team. These medications may hide a fever. Do not treat diarrhea with over the counter products. Contact your care team if you have diarrhea that lasts more than 2 days or if it is severe and watery. This medication can make you more sensitive to the sun. Keep out of the sun. If you cannot avoid being in the sun, wear protective clothing and sunscreen. Do not use sun lamps, tanning beds, or tanning booths. Talk to your care team if you or your partner wish to become pregnant  or think you might be pregnant. This medication can cause serious birth defects if taken during pregnancy and for 3 months after the last dose. A reliable form of contraception is recommended while taking this medication and for 3 months after the last dose. Talk to your care team about effective forms of contraception. Do not father a child while taking this medication and for 3 months after the last dose. Use a condom while having sex during this time period. Do not breastfeed while taking this medication. This medication may cause infertility. Talk to your care team if you are concerned about your fertility. What side effects may I notice from receiving this medication? Side effects that you should report to your care team as soon as possible: Allergic reactions--skin rash, itching, hives, swelling of the face, lips, tongue, or throat Heart attack--pain or tightness in the chest, shoulders, arms, or jaw, nausea, shortness of breath, cold or clammy skin, feeling faint or lightheaded Heart failure--shortness of breath, swelling of the ankles, feet, or hands, sudden weight gain, unusual weakness or fatigue Heart rhythm changes--fast or irregular heartbeat, dizziness, feeling faint or lightheaded, chest pain, trouble breathing High ammonia level--unusual weakness or fatigue, confusion, loss of appetite, nausea, vomiting, seizures Infection--fever, chills, cough, sore throat, wounds that don't heal, pain or trouble when passing urine, general feeling of discomfort or being unwell Low red blood cell level--unusual weakness or fatigue, dizziness, headache, trouble breathing Pain, tingling, or numbness in the hands or feet, muscle weakness, change in vision, confusion or trouble speaking, loss of balance or coordination, trouble walking, seizures Redness, swelling, and blistering of the skin over hands and feet Severe or prolonged diarrhea Unusual bruising or bleeding Side effects that usually do not  require medical attention (report to your care team if they continue or are bothersome): Dry skin Headache Increased tears Nausea Pain, redness, or swelling with sores inside the mouth or throat Sensitivity to light Vomiting This list may not describe all possible side effects. Call your doctor for medical advice about side effects. You may report side effects to FDA at 1-800-FDA-1088. Where should I keep my medication? This medication is given  in a hospital or clinic. It will not be stored at home. NOTE: This sheet is a summary. It may not cover all possible information. If you have questions about this medicine, talk to your doctor, pharmacist, or health care provider.  2023 Elsevier/Gold Standard (2021-05-12 00:00:00)

## 2021-11-24 NOTE — Progress Notes (Signed)
[place] CSW Progress Note  Holiday representative met with patient per his daughter's request.  He requested a [employer] and a bag of food, which CSW provided.  He has received all four of his allotted gift cards.  His daughter, [name], and patient expressed no other needs.    Rodman Pickle Gregory Dowe, LCSW

## 2021-11-24 NOTE — Progress Notes (Signed)
Patient seen by Lisa Thomas NP today  Vitals are within treatment parameters.  Labs reviewed by Lisa Thomas NP and are within treatment parameters.  Per physician team, patient is ready for treatment and there are NO modifications to the treatment plan.     

## 2021-11-25 ENCOUNTER — Other Ambulatory Visit: Payer: Self-pay

## 2021-11-26 ENCOUNTER — Inpatient Hospital Stay: Payer: Medicare PPO | Attending: Oncology

## 2021-11-26 VITALS — BP 109/81 | HR 89 | Temp 98.2°F | Resp 20

## 2021-11-26 DIAGNOSIS — Z923 Personal history of irradiation: Secondary | ICD-10-CM | POA: Diagnosis not present

## 2021-11-26 DIAGNOSIS — R590 Localized enlarged lymph nodes: Secondary | ICD-10-CM | POA: Diagnosis not present

## 2021-11-26 DIAGNOSIS — C158 Malignant neoplasm of overlapping sites of esophagus: Secondary | ICD-10-CM | POA: Insufficient documentation

## 2021-11-26 DIAGNOSIS — Z5112 Encounter for antineoplastic immunotherapy: Secondary | ICD-10-CM | POA: Diagnosis not present

## 2021-11-26 DIAGNOSIS — C159 Malignant neoplasm of esophagus, unspecified: Secondary | ICD-10-CM

## 2021-11-26 DIAGNOSIS — Z5111 Encounter for antineoplastic chemotherapy: Secondary | ICD-10-CM | POA: Insufficient documentation

## 2021-11-26 DIAGNOSIS — Z79899 Other long term (current) drug therapy: Secondary | ICD-10-CM | POA: Diagnosis not present

## 2021-11-26 DIAGNOSIS — R918 Other nonspecific abnormal finding of lung field: Secondary | ICD-10-CM | POA: Insufficient documentation

## 2021-11-26 DIAGNOSIS — Z931 Gastrostomy status: Secondary | ICD-10-CM | POA: Diagnosis not present

## 2021-11-26 MED ORDER — SODIUM CHLORIDE 0.9% FLUSH
10.0000 mL | INTRAVENOUS | Status: DC | PRN
  Administered 2021-11-26: 10 mL

## 2021-11-26 MED ORDER — PEGFILGRASTIM-CBQV 6 MG/0.6ML ~~LOC~~ SOSY
6.0000 mg | PREFILLED_SYRINGE | Freq: Once | SUBCUTANEOUS | Status: AC
  Administered 2021-11-26: 6 mg via SUBCUTANEOUS
  Filled 2021-11-26: qty 0.6

## 2021-11-26 MED ORDER — HEPARIN SOD (PORK) LOCK FLUSH 100 UNIT/ML IV SOLN
500.0000 [IU] | Freq: Once | INTRAVENOUS | Status: AC | PRN
  Administered 2021-11-26: 500 [IU]

## 2021-11-26 NOTE — Patient Instructions (Signed)

## 2021-12-01 ENCOUNTER — Other Ambulatory Visit: Payer: Self-pay | Admitting: Nurse Practitioner

## 2021-12-01 ENCOUNTER — Other Ambulatory Visit (HOSPITAL_BASED_OUTPATIENT_CLINIC_OR_DEPARTMENT_OTHER): Payer: Self-pay

## 2021-12-01 DIAGNOSIS — C153 Malignant neoplasm of upper third of esophagus: Secondary | ICD-10-CM

## 2021-12-01 MED ORDER — OXYCODONE HCL 5 MG/5ML PO SOLN
5.0000 mg | Freq: Four times a day (QID) | ORAL | 0 refills | Status: DC | PRN
  Filled 2021-12-01: qty 200, 5d supply, fill #0

## 2021-12-06 ENCOUNTER — Other Ambulatory Visit: Payer: Self-pay | Admitting: Oncology

## 2021-12-08 ENCOUNTER — Inpatient Hospital Stay: Payer: Medicare PPO

## 2021-12-08 ENCOUNTER — Encounter: Payer: Self-pay | Admitting: *Deleted

## 2021-12-08 ENCOUNTER — Inpatient Hospital Stay: Payer: Medicare PPO | Admitting: Licensed Clinical Social Worker

## 2021-12-08 ENCOUNTER — Other Ambulatory Visit (HOSPITAL_BASED_OUTPATIENT_CLINIC_OR_DEPARTMENT_OTHER): Payer: Self-pay

## 2021-12-08 ENCOUNTER — Inpatient Hospital Stay (HOSPITAL_BASED_OUTPATIENT_CLINIC_OR_DEPARTMENT_OTHER): Payer: Medicare PPO | Admitting: Oncology

## 2021-12-08 VITALS — BP 112/68 | HR 88 | Resp 20

## 2021-12-08 VITALS — BP 112/71 | HR 100 | Temp 98.2°F | Resp 18 | Ht 73.0 in | Wt 206.0 lb

## 2021-12-08 DIAGNOSIS — C159 Malignant neoplasm of esophagus, unspecified: Secondary | ICD-10-CM

## 2021-12-08 DIAGNOSIS — Z5112 Encounter for antineoplastic immunotherapy: Secondary | ICD-10-CM | POA: Diagnosis not present

## 2021-12-08 DIAGNOSIS — C153 Malignant neoplasm of upper third of esophagus: Secondary | ICD-10-CM | POA: Diagnosis not present

## 2021-12-08 LAB — CBC WITH DIFFERENTIAL (CANCER CENTER ONLY)
Abs Immature Granulocytes: 0.03 10*3/uL (ref 0.00–0.07)
Basophils Absolute: 0 10*3/uL (ref 0.0–0.1)
Basophils Relative: 1 %
Eosinophils Absolute: 0.2 10*3/uL (ref 0.0–0.5)
Eosinophils Relative: 3 %
HCT: 37.1 % — ABNORMAL LOW (ref 39.0–52.0)
Hemoglobin: 11.9 g/dL — ABNORMAL LOW (ref 13.0–17.0)
Immature Granulocytes: 1 %
Lymphocytes Relative: 18 %
Lymphs Abs: 1.1 10*3/uL (ref 0.7–4.0)
MCH: 28.7 pg (ref 26.0–34.0)
MCHC: 32.1 g/dL (ref 30.0–36.0)
MCV: 89.6 fL (ref 80.0–100.0)
Monocytes Absolute: 1.1 10*3/uL — ABNORMAL HIGH (ref 0.1–1.0)
Monocytes Relative: 18 %
Neutro Abs: 3.6 10*3/uL (ref 1.7–7.7)
Neutrophils Relative %: 59 %
Platelet Count: 170 10*3/uL (ref 150–400)
RBC: 4.14 MIL/uL — ABNORMAL LOW (ref 4.22–5.81)
RDW: 15.7 % — ABNORMAL HIGH (ref 11.5–15.5)
WBC Count: 6 10*3/uL (ref 4.0–10.5)
nRBC: 0 % (ref 0.0–0.2)

## 2021-12-08 LAB — CMP (CANCER CENTER ONLY)
ALT: 15 U/L (ref 0–44)
AST: 22 U/L (ref 15–41)
Albumin: 3.4 g/dL — ABNORMAL LOW (ref 3.5–5.0)
Alkaline Phosphatase: 128 U/L — ABNORMAL HIGH (ref 38–126)
Anion gap: 11 (ref 5–15)
BUN: 20 mg/dL (ref 8–23)
CO2: 25 mmol/L (ref 22–32)
Calcium: 10.2 mg/dL (ref 8.9–10.3)
Chloride: 98 mmol/L (ref 98–111)
Creatinine: 0.91 mg/dL (ref 0.61–1.24)
GFR, Estimated: >60 mL/min (ref >60)
Glucose, Bld: 135 mg/dL — ABNORMAL HIGH (ref 70–99)
Potassium: 4.2 mmol/L (ref 3.5–5.1)
Sodium: 134 mmol/L — ABNORMAL LOW (ref 135–145)
Total Bilirubin: 0.5 mg/dL (ref 0.3–1.2)
Total Protein: 7.5 g/dL (ref 6.5–8.1)

## 2021-12-08 LAB — TSH: TSH: 1.511 u[IU]/mL (ref 0.350–4.500)

## 2021-12-08 MED ORDER — OXYCODONE HCL 5 MG/5ML PO SOLN
5.0000 mg | Freq: Four times a day (QID) | ORAL | 0 refills | Status: DC | PRN
  Filled 2021-12-08: qty 200, 5d supply, fill #0

## 2021-12-08 MED ORDER — OXALIPLATIN CHEMO INJECTION 100 MG/20ML
85.0000 mg/m2 | Freq: Once | INTRAVENOUS | Status: AC
  Administered 2021-12-08: 175 mg via INTRAVENOUS
  Filled 2021-12-08: qty 35

## 2021-12-08 MED ORDER — SODIUM CHLORIDE 0.9 % IV SOLN
10.0000 mg | Freq: Once | INTRAVENOUS | Status: AC
  Administered 2021-12-08: 10 mg via INTRAVENOUS
  Filled 2021-12-08: qty 1

## 2021-12-08 MED ORDER — PALONOSETRON HCL INJECTION 0.25 MG/5ML
0.2500 mg | Freq: Once | INTRAVENOUS | Status: AC
  Administered 2021-12-08: 0.25 mg via INTRAVENOUS
  Filled 2021-12-08: qty 5

## 2021-12-08 MED ORDER — DEXTROSE 5 % IV SOLN: Freq: Once | INTRAVENOUS | Status: AC

## 2021-12-08 MED ORDER — FLUOROURACIL CHEMO INJECTION 2.5 GM/50ML
400.0000 mg/m2 | Freq: Once | INTRAVENOUS | Status: AC
  Administered 2021-12-08: 800 mg via INTRAVENOUS
  Filled 2021-12-08: qty 16

## 2021-12-08 MED ORDER — LEUCOVORIN CALCIUM INJECTION 350 MG
400.0000 mg/m2 | Freq: Once | INTRAVENOUS | Status: AC
  Administered 2021-12-08: 812 mg via INTRAVENOUS
  Filled 2021-12-08: qty 40.6

## 2021-12-08 MED ORDER — SODIUM CHLORIDE 0.9 % IV SOLN
2000.0000 mg/m2 | INTRAVENOUS | Status: DC
  Administered 2021-12-08: 4050 mg via INTRAVENOUS
  Filled 2021-12-08: qty 81

## 2021-12-08 MED ORDER — SODIUM CHLORIDE 0.9 % IV SOLN
240.0000 mg | Freq: Once | INTRAVENOUS | Status: AC
  Administered 2021-12-08: 240 mg via INTRAVENOUS
  Filled 2021-12-08: qty 24

## 2021-12-08 NOTE — Progress Notes (Signed)
[facility] OFFICE PROGRESS NOTE   Diagnosis: Esophagus cancer  INTERVAL HISTORY:   Mr. Bevins returns as scheduled.  Complete another cycle of FOLFOX/nivolumab on 11/24/2021.  No mouth sores, nausea, diarrhea, cold sensitivity, or peripheral numbness.  He continues to have dysphagia, but is tolerating liquids and some solids.  He continues tube feedings.  He has increased back and hip pain.  These are chronic problems.  Objective:  Vital signs in last 24 hours:  Blood pressure 112/71, pulse 100, temperature 98.2 F (36.8 C), temperature source Oral, resp. rate 18, height _0  (1.854 m), weight 206 lb (93.4 kg), SpO2 98 %.    HEENT: No thrush or ulcers Resp: End inspiratory rhonchi at the left posterior base, no respiratory distress Cardio: Regular rate and rhythm GI: No hepatomegaly, left upper quadrant feeding tube site without evidence of infection Vascular: No leg edema  Skin: Dry skin over the hands and lower legs Neurologic: Mild to moderate loss of vibratory sense at the fingertips bilaterally  Portacath/PICC-without erythema  Lab Results:  Lab Results  Component Value Date   WBC 6.0 12/08/2021   HGB 11.9 (L) 12/08/2021   HCT 37.1 (L) 12/08/2021   MCV 89.6 12/08/2021   PLT 170 12/08/2021   NEUTROABS 3.6 12/08/2021    CMP  Lab Results  Component Value Date   NA 136 11/24/2021   K 4.1 11/24/2021   CL 100 11/24/2021   CO2 26 11/24/2021   GLUCOSE 148 (H) 11/24/2021   BUN 22 11/24/2021   CREATININE 0.96 11/24/2021   CALCIUM 10.0 11/24/2021   PROT 7.2 11/24/2021   ALBUMIN 3.3 (L) 11/24/2021   AST 21 11/24/2021   ALT 16 11/24/2021   ALKPHOS 124 11/24/2021   BILITOT 0.5 11/24/2021   GFRNONAA >60 11/24/2021   GFRAA >60 05/06/2018    Medications: I have reviewed the patient's current medications.   Assessment/Plan: Metastatic esophageal cancer CT neck 07/29/2021-adenopathy at the thoracic inlet and lower bilateral jugular chains,  esophagus mass, left vocal cord paralysis CT chest 07/29/2021-diffuse esophageal wall thickening, most pronounced in the proximal esophagus, multiple pulmonary nodules, mediastinal and supraclavicular lymphadenopathy Upper endoscopy 08/07/2021-mass/stricture extending from the UES to 30 cm CTs chest, abdomen, and pelvis 08/07/2021-long segment wall thickening of the esophagus, prominent proximally, multiple large supraclavicular mediastinal nodes, bilateral lung nodules,, and upper abdominal nodes similar to remote abdominal CT Biopsy 08/07/2021 showed invasive moderately differentiated squamous cell carcinoma Foundation 1-MSS, tumor mutation burden 7, PD-L1 CPS 10 Esophagus radiation 08/13/2021 - 09/03/2021 Cycle 1 FOLFOX/nivolumab 09/08/2021 Cycle 2 held 09/22/2021 due to neutropenia Cycle 2 FOLFOX/nivolumab 09/29/2021, [facility] Cycle 3 FOLFOX/nivolumab 10/13/2021, [facility]den[facility] Cycle 4 FOLFOX/nivolumab 10/27/2021, Uden[facility]ca Cycle 5 FOLFOX/nivolumab 11/10/2021, Udenyca CTs 11/17/2021-unchanged long segment ill-defined circumferential wall thickening of the esophagus; multiple bilateral cavitary pulmonary nodules are slightly diminished in size; increase in size of a nodule in the perihilar posterior left upper lobe with associated postobstructive airspace disease; unchanged enlarged high right paratracheal and low right paraesophageal lymph node; interval sclerosis of multiple osseous metastatic lesions Cycle 6 FOLFOX/nivolumab 11/24/2021, [facility]den[facility]ca Cycle 7 FOLFOX/nivolumab 12/08/2021, Udenyca   Solid/liquid dysphagia secondary to #1 -Gastrostomy tube placement 07/31/2021 Weight loss secondary to #1 Odynophagia secondary to #1 History of colon polyps-tubular adenomas History of a diverticular bleed-April 2020 Anemia Thrombocytosis Left subclavian Port-A-Cath placement 08/10/2021 Left upper extremity edema -Doppler ultrasound of left upper extremity 08/13/2021-negative for DVT 11.  Oxaliplatin  neuropathy-mild/moderate loss of vibratory sense 12/08/2021       Disposition: Mr Kronick  appears stable.  He will complete another cycle of FOLFOX/nivolumab today.  He will return for an office visit and chemotherapy in 2 weeks.  I refilled his prescription for oxycodone.  He reports chronic right hip pain due to arthritis.  Betsy Coder, MD  12/08/2021  8:55 AM

## 2021-12-08 NOTE — Progress Notes (Signed)
[facility] CSW Progress Note  Holiday representative met with caregiver to assess needs.  Patient was receiving his infusion.  His daughter, [name], expressed concern about patient's last wishes.  Discussed Advance Directives and POA.  Informed her that CSW only assists with Advance Directives and not POA.  She said she understood.  Provided her with contact information for [employer] of  and the [facility] for legal counsel regarding POA.  CSW will address Advance Directives with patient during his next visit per her request.  Also provided her with information for [place].    Rodman Pickle Dontrey [place], LCSW

## 2021-12-08 NOTE — Progress Notes (Signed)
Patient seen by Dr. Sherrill today ? ?Vitals are within treatment parameters. ? ?Labs reviewed by Dr. Sherrill and are within treatment parameters. ? ?Per physician team, patient is ready for treatment and there are NO modifications to the treatment plan.  ?

## 2021-12-08 NOTE — Patient Instructions (Signed)
[facility]   Discharge Instructions: Thank you for choosing [facility] to provide your oncology and hematology care.   If you have a lab appointment with the [facility], please go directly to the [facility] and check in at the registration area.   Wear comfortable clothing and clothing appropriate for easy access to any Portacath or PICC line.   We strive to give you quality time with your provider. You may need to reschedule your appointment if you arrive late (15 or more minutes).  Arriving late affects you and other patients whose appointments are after yours.  Also, if you miss three or more appointments without notifying the office, you may be dismissed from the clinic at the provider's discretion.      For prescription refill requests, have your pharmacy contact our office and allow 72 hours for refills to be completed.    Today you received the following chemotherapy and/or immunotherapy agents Nivolumab (OPDIVO), Oxaliplatin (ELOXATIN), Leucovorin & Flourouracil (ADRUCIL).      To help prevent nausea and vomiting after your treatment, we encourage you to take your nausea medication as directed.  BELOW ARE SYMPTOMS THAT SHOULD BE REPORTED IMMEDIATELY: *FEVER GREATER THAN 100.4 F (38 C) OR HIGHER *CHILLS OR SWEATING *NAUSEA AND VOMITING THAT IS NOT CONTROLLED WITH YOUR NAUSEA MEDICATION *UNUSUAL SHORTNESS OF BREATH *UNUSUAL BRUISING OR BLEEDING *URINARY PROBLEMS (pain or burning when urinating, or frequent urination) *BOWEL PROBLEMS (unusual diarrhea, constipation, pain near the anus) TENDERNESS IN MOUTH AND THROAT WITH OR WITHOUT PRESENCE OF ULCERS (sore throat, sores in mouth, or a toothache) UNUSUAL RASH, SWELLING OR PAIN  UNUSUAL VAGINAL DISCHARGE OR ITCHING   Items with * indicate a potential emergency and should be followed up as soon as possible or go to the Emergency Department if any problems should occur.  Please show  the CHEMOTHERAPY ALERT CARD or IMMUNOTHERAPY ALERT CARD at check-in to the Emergency Department and triage nurse.  Should you have questions after your visit or need to cancel or reschedule your appointment, please contact [facility]  Dept: [phone]  and follow the prompts.  Office hours are 8:00 a.m. to 4:30 p.m. Monday - Friday. Please note that voicemails left after 4:00 p.m. may not be returned until the following business day.  We are closed weekends and major holidays. You have access to a nurse at all times for urgent questions. Please call the main number to the clinic Dept: [phone] and follow the prompts.   For any non-urgent questions, you may also contact your provider using MyChart. We now offer e-Visits for anyone 16 and older to request care online for non-urgent symptoms. For details visit [email or web address].   Also download the MyChart app! Go to the app store, search "MyChart", open the app, select [facility], and log in with your MyChart username and password.  Masks are optional in the cancer centers. If you would like for your care team to wear a mask while they are taking care of you, please let them know. You may have one support person who is at least 72 years old accompany you for your appointments.  Nivolumab Injection What is this medication? NIVOLUMAB (nye VOL ue mab) treats some types of cancer. It works by helping your immune system slow or stop the spread of cancer cells. It is a monoclonal antibody. This medicine may be used for other purposes; ask your health care provider or pharmacist if you  have questions. COMMON BRAND NAME(S): Opdivo What should I tell my care team before I take this medication? They need to know if you have any of these conditions: Allogeneic stem cell transplant (uses someone else's stem cells) Autoimmune diseases, such as Crohn disease, ulcerative colitis, lupus History of chest radiation Nervous  system problems, such as Guillain-Barre syndrome or myasthenia gravis Organ transplant An unusual or allergic reaction to nivolumab, other medications, foods, dyes, or preservatives Pregnant or trying to get pregnant Breast-feeding How should I use this medication? This medication is infused into a vein. It is given in a hospital or clinic setting. A special MedGuide will be given to you before each treatment. Be sure to read this information carefully each time. Talk to your care team about the use of this medication in children. While it may be prescribed for children as young as 12 years for selected conditions, precautions do apply. Overdosage: If you think you have taken too much of this medicine contact a poison control center or emergency room at once. NOTE: This medicine is only for you. Do not share this medicine with others. What if I miss a dose? Keep appointments for follow-up doses. It is important not to miss your dose. Call your care team if you are unable to keep an appointment. What may interact with this medication? Interactions have not been studied. This list may not describe all possible interactions. Give your health care provider a list of all the medicines, herbs, non-prescription drugs, or dietary supplements you use. Also tell them if you smoke, drink alcohol, or use illegal drugs. Some items may interact with your medicine. What should I watch for while using this medication? Your condition will be monitored carefully while you are receiving this medication. You may need blood work while taking this medication. This medication may cause serious skin reactions. They can happen weeks to months after starting the medication. Contact your care team right away if you notice fevers or flu-like symptoms with a rash. The rash may be red or purple and then turn into blisters or peeling of the skin. You may also notice a red rash with swelling of the face, lips, or lymph nodes in  your neck or under your arms. Tell your care team right away if you have any change in your eyesight. Talk to your care team if you are pregnant or think you might be pregnant. A negative pregnancy test is required before starting this medication. A reliable form of contraception is recommended while taking this medication and for 5 months after the last dose. Talk to your care team about effective forms of contraception. Do not breast-feed while taking this medication and for 5 months after the last dose. What side effects may I notice from receiving this medication? Side effects that you should report to your care team as soon as possible: Allergic reactions--skin rash, itching, hives, swelling of the face, lips, tongue, or throat Dry cough, shortness of breath or trouble breathing Eye pain, redness, irritation, or discharge with blurry or decreased vision Heart muscle inflammation--unusual weakness or fatigue, shortness of breath, chest pain, fast or irregular heartbeat, dizziness, swelling of the ankles, feet, or hands Hormone gland problems--headache, sensitivity to light, unusual weakness or fatigue, dizziness, fast or irregular heartbeat, increased sensitivity to cold or heat, excessive sweating, constipation, hair loss, increased thirst or amount of urine, tremors or shaking, irritability Infusion reactions--chest pain, shortness of breath or trouble breathing, feeling faint or lightheaded Kidney injury (glomerulonephritis)--decrease in  the amount of urine, red or dark brown urine, foamy or bubbly urine, swelling of the ankles, hands, or feet Liver injury--right upper belly pain, loss of appetite, nausea, light-colored stool, dark yellow or brown urine, yellowing skin or eyes, unusual weakness or fatigue Pain, tingling, or numbness in the hands or feet, muscle weakness, change in vision, confusion or trouble speaking, loss of balance or coordination, trouble walking, seizures Rash, fever, and  swollen lymph nodes Redness, blistering, peeling, or loosening of the skin, including inside the mouth Sudden or severe stomach pain, bloody diarrhea, fever, nausea, vomiting Side effects that usually do not require medical attention (report these to your care team if they continue or are bothersome): Bone, joint, or muscle pain Diarrhea Fatigue Loss of appetite Nausea Skin rash This list may not describe all possible side effects. Call your doctor for medical advice about side effects. You may report side effects to FDA at 1-800-FDA-1088. Where should I keep my medication? This medication is given in a hospital or clinic. It will not be stored at home. NOTE: This sheet is a summary. It may not cover all possible information. If you have questions about this medicine, talk to your doctor, pharmacist, or health care provider.  2023 Elsevier/Gold Standard (2021-05-11 00:00:00)  Oxaliplatin Injection What is this medication? OXALIPLATIN (ox AL i PLA tin) treats some types of cancer. It works by slowing down the growth of cancer cells. This medicine may be used for other purposes; ask your health care provider or pharmacist if you have questions. COMMON BRAND NAME(S): Eloxatin What should I tell my care team before I take this medication? They need to know if you have any of these conditions: Heart disease History of irregular heartbeat or rhythm Liver disease Low blood cell levels (white cells, red cells, and platelets) Lung or breathing disease, such as asthma Take medications that treat or prevent blood clots Tingling of the fingers, toes, or other nerve disorder An unusual or allergic reaction to oxaliplatin, other medications, foods, dyes, or preservatives If you or your partner are pregnant or trying to get pregnant Breast-feeding How should I use this medication? This medication is injected into a vein. It is given by your care team in a hospital or clinic setting. Talk to your  care team about the use of this medication in children. Special care may be needed. Overdosage: If you think you have taken too much of this medicine contact a poison control center or emergency room at once. NOTE: This medicine is only for you. Do not share this medicine with others. What if I miss a dose? Keep appointments for follow-up doses. It is important not to miss a dose. Call your care team if you are unable to keep an appointment. What may interact with this medication? Do not take this medication with any of the following: Cisapride Dronedarone Pimozide Thioridazine This medication may also interact with the following: Aspirin and aspirin-like medications Certain medications that treat or prevent blood clots, such as warfarin, apixaban, dabigatran, and rivaroxaban Cisplatin Cyclosporine Diuretics Medications for infection, such as acyclovir, adefovir, amphotericin B, bacitracin, cidofovir, foscarnet, ganciclovir, gentamicin, pentamidine, vancomycin NSAIDs, medications for pain and inflammation, such as ibuprofen or naproxen Other medications that cause heart rhythm changes Pamidronate Zoledronic acid This list may not describe all possible interactions. Give your health care provider a list of all the medicines, herbs, non-prescription drugs, or dietary supplements you use. Also tell them if you smoke, drink alcohol, or use illegal drugs. Some  items may interact with your medicine. What should I watch for while using this medication? Your condition will be monitored carefully while you are receiving this medication. You may need blood work while taking this medication. This medication may make you feel generally unwell. This is not uncommon as chemotherapy can affect healthy cells as well as cancer cells. Report any side effects. Continue your course of treatment even though you feel ill unless your care team tells you to stop. This medication may increase your risk of getting  an infection. Call your care team for advice if you get a fever, chills, sore throat, or other symptoms of a cold or flu. Do not treat yourself. Try to avoid being around people who are sick. Avoid taking medications that contain aspirin, acetaminophen, ibuprofen, naproxen, or ketoprofen unless instructed by your care team. These medications may hide a fever. Be careful brushing or flossing your teeth or using a toothpick because you may get an infection or bleed more easily. If you have any dental work done, tell your dentist you are receiving this medication. This medication can make you more sensitive to cold. Do not drink cold drinks or use ice. Cover exposed skin before coming in contact with cold temperatures or cold objects. When out in cold weather wear warm clothing and cover your mouth and nose to warm the air that goes into your lungs. Tell your care team if you get sensitive to the cold. Talk to your care team if you or your partner are pregnant or think either of you might be pregnant. This medication can cause serious birth defects if taken during pregnancy and for 9 months after the last dose. A negative pregnancy test is required before starting this medication. A reliable form of contraception is recommended while taking this medication and for 9 months after the last dose. Talk to your care team about effective forms of contraception. Do not father a child while taking this medication and for 6 months after the last dose. Use a condom while having sex during this time period. Do not breastfeed while taking this medication and for 3 months after the last dose. This medication may cause infertility. Talk to your care team if you are concerned about your fertility. What side effects may I notice from receiving this medication? Side effects that you should report to your care team as soon as possible: Allergic reactions--skin rash, itching, hives, swelling of the face, lips, tongue, or  throat Bleeding--bloody or black, tar-like stools, vomiting blood or brown material that looks like coffee grounds, red or dark brown urine, small red or purple spots on skin, unusual bruising or bleeding Dry cough, shortness of breath or trouble breathing Heart rhythm changes--fast or irregular heartbeat, dizziness, feeling faint or lightheaded, chest pain, trouble breathing Infection--fever, chills, cough, sore throat, wounds that don't heal, pain or trouble when passing urine, general feeling of discomfort or being unwell Liver injury--right upper belly pain, loss of appetite, nausea, light-colored stool, dark yellow or brown urine, yellowing skin or eyes, unusual weakness or fatigue Low red blood cell level--unusual weakness or fatigue, dizziness, headache, trouble breathing Muscle injury--unusual weakness or fatigue, muscle pain, dark yellow or brown urine, decrease in amount of urine Pain, tingling, or numbness in the hands or feet Sudden and severe headache, confusion, change in vision, seizures, which may be signs of posterior reversible encephalopathy syndrome (PRES) Unusual bruising or bleeding Side effects that usually do not require medical attention (report to your care team  if they continue or are bothersome): Diarrhea Nausea Pain, redness, or swelling with sores inside the mouth or throat Unusual weakness or fatigue Vomiting This list may not describe all possible side effects. Call your doctor for medical advice about side effects. You may report side effects to FDA at 1-800-FDA-1088. Where should I keep my medication? This medication is given in a hospital or clinic. It will not be stored at home. NOTE: This sheet is a summary. It may not cover all possible information. If you have questions about this medicine, talk to your doctor, pharmacist, or health care provider.  2023 Elsevier/Gold Standard (2007-03-04 00:00:00)  Leucovorin Injection What is this  medication? LEUCOVORIN (loo koe VOR in) prevents side effects from certain medications, such as methotrexate. It works by increasing folate levels. This helps protect healthy cells in your body. It may also be used to treat anemia caused by low levels of folate. It can also be used with fluorouracil, a type of chemotherapy, to treat colorectal cancer. It works by increasing the effects of fluorouracil in the body. This medicine may be used for other purposes; ask your health care provider or pharmacist if you have questions. What should I tell my care team before I take this medication? They need to know if you have any of these conditions: Anemia from low levels of vitamin B12 in the blood An unusual or allergic reaction to leucovorin, folic acid, other medications, foods, dyes, or preservatives Pregnant or trying to get pregnant Breastfeeding How should I use this medication? This medication is injected into a vein or a muscle. It is given by your care team in a hospital or clinic setting. Talk to your care team about the use of this medication in children. Special care may be needed. Overdosage: If you think you have taken too much of this medicine contact a poison control center or emergency room at once. NOTE: This medicine is only for you. Do not share this medicine with others. What if I miss a dose? Keep appointments for follow-up doses. It is important not to miss your dose. Call your care team if you are unable to keep an appointment. What may interact with this medication? Capecitabine Fluorouracil Phenobarbital Phenytoin Primidone Trimethoprim;sulfamethoxazole This list may not describe all possible interactions. Give your health care provider a list of all the medicines, herbs, non-prescription drugs, or dietary supplements you use. Also tell them if you smoke, drink alcohol, or use illegal drugs. Some items may interact with your medicine. What should I watch for while using  this medication? Your condition will be monitored carefully while you are receiving this medication. This medication may increase the side effects of 5-fluorouracil. Tell your care team if you have diarrhea or mouth sores that do not get better or that get worse. What side effects may I notice from receiving this medication? Side effects that you should report to your care team as soon as possible: Allergic reactions--skin rash, itching, hives, swelling of the face, lips, tongue, or throat This list may not describe all possible side effects. Call your doctor for medical advice about side effects. You may report side effects to FDA at 1-800-FDA-1088. Where should I keep my medication? This medication is given in a hospital or clinic. It will not be stored at home. NOTE: This sheet is a summary. It may not cover all possible information. If you have questions about this medicine, talk to your doctor, pharmacist, or health care provider.  2023 Elsevier/Gold Standard (  2021-06-16 00:00:00)  Fluorouracil Injection What is this medication? FLUOROURACIL (flure oh YOOR a sil) treats some types of cancer. It works by slowing down the growth of cancer cells. This medicine may be used for other purposes; ask your health care provider or pharmacist if you have questions. COMMON BRAND NAME(S): Adrucil What should I tell my care team before I take this medication? They need to know if you have any of these conditions: Blood disorders Dihydropyrimidine dehydrogenase (DPD) deficiency Infection, such as chickenpox, cold sores, herpes Kidney disease Liver disease Poor nutrition Recent or ongoing radiation therapy An unusual or allergic reaction to fluorouracil, other medications, foods, dyes, or preservatives If you or your partner are pregnant or trying to get pregnant Breast-feeding How should I use this medication? This medication is injected into a vein. It is administered by your care team in a  hospital or clinic setting. Talk to your care team about the use of this medication in children. Special care may be needed. Overdosage: If you think you have taken too much of this medicine contact a poison control center or emergency room at once. NOTE: This medicine is only for you. Do not share this medicine with others. What if I miss a dose? Keep appointments for follow-up doses. It is important not to miss your dose. Call your care team if you are unable to keep an appointment. What may interact with this medication? Do not take this medication with any of the following: Live virus vaccines This medication may also interact with the following: Medications that treat or prevent blood clots, such as warfarin, enoxaparin, dalteparin This list may not describe all possible interactions. Give your health care provider a list of all the medicines, herbs, non-prescription drugs, or dietary supplements you use. Also tell them if you smoke, drink alcohol, or use illegal drugs. Some items may interact with your medicine. What should I watch for while using this medication? Your condition will be monitored carefully while you are receiving this medication. This medication may make you feel generally unwell. This is not uncommon as chemotherapy can affect healthy cells as well as cancer cells. Report any side effects. Continue your course of treatment even though you feel ill unless your care team tells you to stop. In some cases, you may be given additional medications to help with side effects. Follow all directions for their use. This medication may increase your risk of getting an infection. Call your care team for advice if you get a fever, chills, sore throat, or other symptoms of a cold or flu. Do not treat yourself. Try to avoid being around people who are sick. This medication may increase your risk to bruise or bleed. Call your care team if you notice any unusual bleeding. Be careful brushing  or flossing your teeth or using a toothpick because you may get an infection or bleed more easily. If you have any dental work done, tell your dentist you are receiving this medication. Avoid taking medications that contain aspirin, acetaminophen, ibuprofen, naproxen, or ketoprofen unless instructed by your care team. These medications may hide a fever. Do not treat diarrhea with over the counter products. Contact your care team if you have diarrhea that lasts more than 2 days or if it is severe and watery. This medication can make you more sensitive to the sun. Keep out of the sun. If you cannot avoid being in the sun, wear protective clothing and sunscreen. Do not use sun lamps, tanning beds, or  tanning booths. Talk to your care team if you or your partner wish to become pregnant or think you might be pregnant. This medication can cause serious birth defects if taken during pregnancy and for 3 months after the last dose. A reliable form of contraception is recommended while taking this medication and for 3 months after the last dose. Talk to your care team about effective forms of contraception. Do not father a child while taking this medication and for 3 months after the last dose. Use a condom while having sex during this time period. Do not breastfeed while taking this medication. This medication may cause infertility. Talk to your care team if you are concerned about your fertility. What side effects may I notice from receiving this medication? Side effects that you should report to your care team as soon as possible: Allergic reactions--skin rash, itching, hives, swelling of the face, lips, tongue, or throat Heart attack--pain or tightness in the chest, shoulders, arms, or jaw, nausea, shortness of breath, cold or clammy skin, feeling faint or lightheaded Heart failure--shortness of breath, swelling of the ankles, feet, or hands, sudden weight gain, unusual weakness or fatigue Heart rhythm  changes--fast or irregular heartbeat, dizziness, feeling faint or lightheaded, chest pain, trouble breathing High ammonia level--unusual weakness or fatigue, confusion, loss of appetite, nausea, vomiting, seizures Infection--fever, chills, cough, sore throat, wounds that don't heal, pain or trouble when passing urine, general feeling of discomfort or being unwell Low red blood cell level--unusual weakness or fatigue, dizziness, headache, trouble breathing Pain, tingling, or numbness in the hands or feet, muscle weakness, change in vision, confusion or trouble speaking, loss of balance or coordination, trouble walking, seizures Redness, swelling, and blistering of the skin over hands and feet Severe or prolonged diarrhea Unusual bruising or bleeding Side effects that usually do not require medical attention (report to your care team if they continue or are bothersome): Dry skin Headache Increased tears Nausea Pain, redness, or swelling with sores inside the mouth or throat Sensitivity to light Vomiting This list may not describe all possible side effects. Call your doctor for medical advice about side effects. You may report side effects to FDA at 1-800-FDA-1088. Where should I keep my medication? This medication is given in a hospital or clinic. It will not be stored at home. NOTE: This sheet is a summary. It may not cover all possible information. If you have questions about this medicine, talk to your doctor, pharmacist, or health care provider.  2023 [employer]/Gold Standard (2021-05-12 00:00:00)  The chemotherapy medication bag should finish at 46 hours, 96 hours, or 7 days. For example, if your pump is scheduled for 46 hours and it was put on at 4:00 p.m., it should finish at 2:00 p.m. the day it is scheduled to come off regardless of your appointment time.     Estimated time to finish at 11:30 a.m. on Thursday 12/10/2021.   If the display on your pump reads "Low Volume" and it is  beeping, take the batteries out of the pump and come to the cancer center for it to be taken off.   If the pump alarms go off prior to the pump reading "Low Volume" then call [phone] and someone can assist you.  If the plunger comes out and the chemotherapy medication is leaking out, please use your home chemo spill kit to clean up the spill. Do NOT use paper towels or other household products.  If you have problems or questions regarding your pump,  please call either [phone] (24 hours a day) or the cancer center Monday-Friday 8:00 a.m.- 4:30 p.m. at the clinic number and we will assist you. If you are unable to get assistance, then go to the nearest Emergency Department and ask the staff to contact the IV team for assistance.

## 2021-12-09 ENCOUNTER — Other Ambulatory Visit: Payer: Self-pay

## 2021-12-10 ENCOUNTER — Inpatient Hospital Stay: Payer: Medicare PPO

## 2021-12-10 VITALS — BP 111/65 | HR 92 | Temp 98.5°F | Resp 20

## 2021-12-10 DIAGNOSIS — C159 Malignant neoplasm of esophagus, unspecified: Secondary | ICD-10-CM

## 2021-12-10 DIAGNOSIS — Z5112 Encounter for antineoplastic immunotherapy: Secondary | ICD-10-CM | POA: Diagnosis not present

## 2021-12-10 LAB — T4: T4, Total: 10.9 ug/dL (ref 4.5–12.0)

## 2021-12-10 MED ORDER — PEGFILGRASTIM-CBQV 6 MG/0.6ML ~~LOC~~ SOSY
6.0000 mg | PREFILLED_SYRINGE | Freq: Once | SUBCUTANEOUS | Status: AC
  Administered 2021-12-10: 6 mg via SUBCUTANEOUS
  Filled 2021-12-10: qty 0.6

## 2021-12-10 MED ORDER — HEPARIN SOD (PORK) LOCK FLUSH 100 UNIT/ML IV SOLN
500.0000 [IU] | Freq: Once | INTRAVENOUS | Status: AC | PRN
  Administered 2021-12-10: 500 [IU]

## 2021-12-10 MED ORDER — SODIUM CHLORIDE 0.9% FLUSH
10.0000 mL | INTRAVENOUS | Status: DC | PRN
  Administered 2021-12-10: 10 mL

## 2021-12-10 NOTE — Patient Instructions (Signed)

## 2021-12-17 ENCOUNTER — Other Ambulatory Visit: Payer: Self-pay | Admitting: Oncology

## 2021-12-17 DIAGNOSIS — C159 Malignant neoplasm of esophagus, unspecified: Secondary | ICD-10-CM

## 2021-12-22 ENCOUNTER — Inpatient Hospital Stay: Payer: Medicare PPO

## 2021-12-22 ENCOUNTER — Inpatient Hospital Stay (HOSPITAL_BASED_OUTPATIENT_CLINIC_OR_DEPARTMENT_OTHER): Payer: Medicare PPO | Admitting: Oncology

## 2021-12-22 ENCOUNTER — Other Ambulatory Visit (HOSPITAL_BASED_OUTPATIENT_CLINIC_OR_DEPARTMENT_OTHER): Payer: Self-pay

## 2021-12-22 ENCOUNTER — Inpatient Hospital Stay: Payer: Medicare PPO | Admitting: Licensed Clinical Social Worker

## 2021-12-22 ENCOUNTER — Encounter: Payer: Self-pay | Admitting: *Deleted

## 2021-12-22 VITALS — BP 106/67 | HR 78 | Resp 20

## 2021-12-22 VITALS — BP 115/63 | HR 100 | Temp 98.1°F | Resp 20 | Ht 73.0 in | Wt 205.4 lb

## 2021-12-22 DIAGNOSIS — Z5112 Encounter for antineoplastic immunotherapy: Secondary | ICD-10-CM | POA: Diagnosis not present

## 2021-12-22 DIAGNOSIS — C153 Malignant neoplasm of upper third of esophagus: Secondary | ICD-10-CM | POA: Diagnosis not present

## 2021-12-22 DIAGNOSIS — C159 Malignant neoplasm of esophagus, unspecified: Secondary | ICD-10-CM

## 2021-12-22 LAB — CMP (CANCER CENTER ONLY)
ALT: 11 U/L (ref 0–44)
AST: 17 U/L (ref 15–41)
Albumin: 3.3 g/dL — ABNORMAL LOW (ref 3.5–5.0)
Alkaline Phosphatase: 106 U/L (ref 38–126)
Anion gap: 10 (ref 5–15)
BUN: 19 mg/dL (ref 8–23)
CO2: 26 mmol/L (ref 22–32)
Calcium: 9.6 mg/dL (ref 8.9–10.3)
Chloride: 99 mmol/L (ref 98–111)
Creatinine: 0.91 mg/dL (ref 0.61–1.24)
GFR, Estimated: >60 mL/min (ref >60)
Glucose, Bld: 170 mg/dL — ABNORMAL HIGH (ref 70–99)
Potassium: 4.1 mmol/L (ref 3.5–5.1)
Sodium: 135 mmol/L (ref 135–145)
Total Bilirubin: 0.6 mg/dL (ref 0.3–1.2)
Total Protein: 7.2 g/dL (ref 6.5–8.1)

## 2021-12-22 LAB — CBC WITH DIFFERENTIAL (CANCER CENTER ONLY)
Abs Immature Granulocytes: 0.04 10*3/uL (ref 0.00–0.07)
Basophils Absolute: 0 10*3/uL (ref 0.0–0.1)
Basophils Relative: 1 %
Eosinophils Absolute: 0 10*3/uL (ref 0.0–0.5)
Eosinophils Relative: 1 %
HCT: 35.3 % — ABNORMAL LOW (ref 39.0–52.0)
Hemoglobin: 11.4 g/dL — ABNORMAL LOW (ref 13.0–17.0)
Immature Granulocytes: 1 %
Lymphocytes Relative: 22 %
Lymphs Abs: 1.1 10*3/uL (ref 0.7–4.0)
MCH: 28.7 pg (ref 26.0–34.0)
MCHC: 32.3 g/dL (ref 30.0–36.0)
MCV: 88.9 fL (ref 80.0–100.0)
Monocytes Absolute: 1.1 10*3/uL — ABNORMAL HIGH (ref 0.1–1.0)
Monocytes Relative: 22 %
Neutro Abs: 2.7 10*3/uL (ref 1.7–7.7)
Neutrophils Relative %: 53 %
Platelet Count: 145 10*3/uL — ABNORMAL LOW (ref 150–400)
RBC: 3.97 MIL/uL — ABNORMAL LOW (ref 4.22–5.81)
RDW: 15.2 % (ref 11.5–15.5)
WBC Count: 5.1 10*3/uL (ref 4.0–10.5)
nRBC: 0 % (ref 0.0–0.2)

## 2021-12-22 LAB — TSH: TSH: 1.581 u[IU]/mL (ref 0.350–4.500)

## 2021-12-22 MED ORDER — DEXTROSE 5 % IV SOLN: Freq: Once | INTRAVENOUS | Status: AC

## 2021-12-22 MED ORDER — OXALIPLATIN CHEMO INJECTION 100 MG/20ML
85.0000 mg/m2 | Freq: Once | INTRAVENOUS | Status: AC
  Administered 2021-12-22: 175 mg via INTRAVENOUS
  Filled 2021-12-22: qty 35

## 2021-12-22 MED ORDER — FLUOROURACIL CHEMO INJECTION 2.5 GM/50ML
400.0000 mg/m2 | Freq: Once | INTRAVENOUS | Status: AC
  Administered 2021-12-22: 800 mg via INTRAVENOUS
  Filled 2021-12-22: qty 16

## 2021-12-22 MED ORDER — SODIUM CHLORIDE 0.9 % IV SOLN
240.0000 mg | Freq: Once | INTRAVENOUS | Status: AC
  Administered 2021-12-22: 240 mg via INTRAVENOUS
  Filled 2021-12-22: qty 24

## 2021-12-22 MED ORDER — SODIUM CHLORIDE 0.9 % IV SOLN
2000.0000 mg/m2 | INTRAVENOUS | Status: DC
  Administered 2021-12-22: 4050 mg via INTRAVENOUS
  Filled 2021-12-22: qty 81

## 2021-12-22 MED ORDER — SODIUM CHLORIDE 0.9 % IV SOLN
10.0000 mg | Freq: Once | INTRAVENOUS | Status: AC
  Administered 2021-12-22: 10 mg via INTRAVENOUS
  Filled 2021-12-22: qty 10

## 2021-12-22 MED ORDER — OXYCODONE HCL 5 MG/5ML PO SOLN
5.0000 mg | Freq: Four times a day (QID) | ORAL | 0 refills | Status: DC | PRN
  Filled 2021-12-22: qty 200, 5d supply, fill #0

## 2021-12-22 MED ORDER — PALONOSETRON HCL INJECTION 0.25 MG/5ML
0.2500 mg | Freq: Once | INTRAVENOUS | Status: AC
  Administered 2021-12-22: 0.25 mg via INTRAVENOUS
  Filled 2021-12-22: qty 5

## 2021-12-22 MED ORDER — LEUCOVORIN CALCIUM INJECTION 350 MG
400.0000 mg/m2 | Freq: Once | INTRAVENOUS | Status: AC
  Administered 2021-12-22: 812 mg via INTRAVENOUS
  Filled 2021-12-22: qty 40.6

## 2021-12-22 NOTE — Addendum Note (Signed)
Addended by: Betsy Coder B on: 12/22/2021 09:40 AM   Modules accepted: Orders

## 2021-12-22 NOTE — Progress Notes (Signed)
[place] OFFICE PROGRESS NOTE   Diagnosis: Esophagus cancer  INTERVAL HISTORY:   Leslie Livingston pleated on cycle of FOLFOX on 12/08/2021.  No nausea/vomiting, mouth sores, diarrhea, or neuropathy symptoms.  He is able to drink some fluid.  He continues tube feedings.  He feels weak.  He has pain at the right hip.  This predated the cancer diagnosis.  He relates the pain to osteoarthritis.  He takes oxycodone twice daily.   Objective:  Vital signs in last 24 hours:  Blood pressure 115/63, pulse 100, temperature 98.1 F (36.7 C), temperature source Oral, resp. rate 20, height 6' 1" (1.854 m), weight 205 lb 6.4 oz (93.2 kg), SpO2 100 %.    HEENT: No thrush or ulcers Resp: End inspiratory rhonchi at the posterior base bilaterally, no respiratory distress Cardio: Regular rate and rhythm GI: No hepatomegaly, left upper quadrant feeding tube Vascular: No leg edema Neuro: Mild loss of vibratory sense at the fingertips bilaterally Skin: Dryness of the hands  Portacath/PICC-without erythema  Lab Results:  Lab Results  Component Value Date   WBC 5.1 12/22/2021   HGB 11.4 (L) 12/22/2021   HCT 35.3 (L) 12/22/2021   MCV 88.9 12/22/2021   PLT 145 (L) 12/22/2021   NEUTROABS 2.7 12/22/2021    CMP  Lab Results  Component Value Date   NA 134 (L) 12/08/2021   K 4.2 12/08/2021   CL 98 12/08/2021   CO2 25 12/08/2021   GLUCOSE 135 (H) 12/08/2021   BUN 20 12/08/2021   CREATININE 0.91 12/08/2021   CALCIUM 10.2 12/08/2021   PROT 7.5 12/08/2021   ALBUMIN 3.4 (L) 12/08/2021   AST 22 12/08/2021   ALT 15 12/08/2021   ALKPHOS 128 (H) 12/08/2021   BILITOT 0.5 12/08/2021   GFRNONAA >60 12/08/2021   GFRAA >60 05/06/2018     Medications: I have reviewed the patient's current medications.   Assessment/Plan: Metastatic esophageal cancer CT neck 07/29/2021-adenopathy at the thoracic inlet and lower bilateral jugular chains, esophagus mass, left vocal cord paralysis CT  chest 07/29/2021-diffuse esophageal wall thickening, most pronounced in the proximal esophagus, multiple pulmonary nodules, mediastinal and supraclavicular lymphadenopathy Upper endoscopy 08/07/2021-mass/stricture extending from the UES to 30 cm CTs chest, abdomen, and pelvis 08/07/2021-long segment wall thickening of the esophagus, prominent proximally, multiple large supraclavicular mediastinal nodes, bilateral lung nodules,, and upper abdominal nodes similar to remote abdominal CT Biopsy 08/07/2021 showed invasive moderately differentiated squamous cell carcinoma Foundation 1-MSS, tumor mutation burden 7, PD-L1 CPS 10 Esophagus radiation 08/13/2021 - 09/03/2021 Cycle 1 FOLFOX/nivolumab 09/08/2021 Cycle 2 held 09/22/2021 due to neutropenia Cycle 2 FOLFOX/nivolumab 09/29/2021, Udenyca Cycle 3 FOLFOX/nivolumab 10/13/2021, Udenyca Cycle 4 FOLFOX/nivolumab 10/27/2021, Udenyca Cycle 5 FOLFOX/nivolumab 11/10/2021, Udenyca CTs 11/17/2021-unchanged long segment ill-defined circumferential wall thickening of the esophagus; multiple bilateral cavitary pulmonary nodules are slightly diminished in size; increase in size of a nodule in the perihilar posterior left upper lobe with associated postobstructive airspace disease; unchanged enlarged high right paratracheal and low right paraesophageal lymph node; interval sclerosis of multiple osseous metastatic lesions Cycle 6 FOLFOX/nivolumab 11/24/2021, Udenyca Cycle 7 FOLFOX/nivolumab 12/08/2021, Udenyca Cycle 8 FOLFOX/nivolumab 12/22/2021, Udenyca   Solid/liquid dysphagia secondary to #1 -Gastrostomy tube placement 07/31/2021 Weight loss secondary to #1 Odynophagia secondary to #1 History of colon polyps-tubular adenomas History of a diverticular bleed-April 2020 Anemia Thrombocytosis Left subclavian Port-A-Cath placement 08/10/2021 Left upper extremity edema -Doppler ultrasound of left upper extremity 08/13/2021-negative for DVT 11.  Oxaliplatin  neuropathy-mild/moderate loss of vibratory sense 12/08/2021, 12/22/2021  Disposition: Leslie Livingston appears stable.  He continues to tolerate the FOLFOX/nivolumab well.  He has mild changes of oxaliplatin neuropathy in the fingers.  This does not interfere with activity.  He will complete another cycle of FOLFOX/nivolumab today.  He will be referred for a restaging CT after cycle 10.  We will image the right hip with the next restaging CT.  I refilled his prescription for oxycodone.   , MD  12/22/2021  9:14 AM   

## 2021-12-22 NOTE — Patient Instructions (Signed)
Leslie Livingston   Discharge Instructions: Thank you for choosing [facility] to provide your oncology and hematology care.   If you have a lab appointment with the [facility], please go directly to the [facility] and check in at the registration area.   Wear comfortable clothing and clothing appropriate for easy access to any Portacath or PICC line.   We strive to give you quality time with your provider. You may need to reschedule your appointment if you arrive late (15 or more minutes).  Arriving late affects you and other patients whose appointments are after yours.  Also, if you miss three or more appointments without notifying the office, you may be dismissed from the clinic at the provider's discretion.      For prescription refill requests, have your pharmacy contact our office and allow 72 hours for refills to be completed.    Today you received the following chemotherapy and/or immunotherapy agents Nivolumab (OPDIVO), Oxaliplatin (ELOXATIN), Leucovorin & Flourouracil (ADRUCIL).      To help prevent nausea and vomiting after your treatment, we encourage you to take your nausea medication as directed.  BELOW ARE SYMPTOMS THAT SHOULD BE REPORTED IMMEDIATELY: *FEVER GREATER THAN 100.4 F (38 C) OR HIGHER *CHILLS OR SWEATING *NAUSEA AND VOMITING THAT IS NOT CONTROLLED WITH YOUR NAUSEA MEDICATION *UNUSUAL SHORTNESS OF BREATH *UNUSUAL BRUISING OR BLEEDING *URINARY PROBLEMS (pain or burning when urinating, or frequent urination) *BOWEL PROBLEMS (unusual diarrhea, constipation, pain near the anus) TENDERNESS IN MOUTH AND THROAT WITH OR WITHOUT PRESENCE OF ULCERS (sore throat, sores in mouth, or a toothache) UNUSUAL RASH, SWELLING OR PAIN  UNUSUAL VAGINAL DISCHARGE OR ITCHING   Items with * indicate a potential emergency and should be followed up as soon as possible or go to the Emergency Department if any problems should occur.  Please show  the CHEMOTHERAPY ALERT CARD or IMMUNOTHERAPY ALERT CARD at check-in to the Emergency Department and triage nurse.  Should you have questions after your visit or need to cancel or reschedule your appointment, please contact [facility]  Dept: [phone]  and follow the prompts.  Office hours are 8:00 a.m. to 4:30 p.m. Monday - Friday. Please note that voicemails left after 4:00 p.m. may not be returned until the following business day.  We are closed weekends and major holidays. You have access to a nurse at all times for urgent questions. Please call the main number to the clinic Dept: [phone] and follow the prompts.   For any non-urgent questions, you may also contact your provider using MyChart. We now offer e-Visits for anyone 16 and older to request care online for non-urgent symptoms. For details visit [email or web address].   Also download the MyChart app! Go to the app store, search "MyChart", open the app, select [facility], and log in with your MyChart username and password.  Masks are optional in the cancer centers. If you would like for your care team to wear a mask while they are taking care of you, please let them know. You may have one support person who is at least 72 years old accompany you for your appointments.  Nivolumab Injection What is this medication? NIVOLUMAB (nye VOL ue mab) treats some types of cancer. It works by helping your immune system slow or stop the spread of cancer cells. It is a monoclonal antibody. This medicine may be used for other purposes; ask your health care provider or pharmacist if you  have questions. COMMON BRAND NAME(S): Opdivo What should I tell my care team before I take this medication? They need to know if you have any of these conditions: Allogeneic stem cell transplant (uses someone else's stem cells) Autoimmune diseases, such as Crohn disease, ulcerative colitis, lupus History of chest radiation Nervous  system problems, such as Guillain-Barre syndrome or myasthenia gravis Organ transplant An unusual or allergic reaction to nivolumab, other medications, foods, dyes, or preservatives Pregnant or trying to get pregnant Breast-feeding How should I use this medication? This medication is infused into a vein. It is given in a hospital or clinic setting. A special MedGuide will be given to you before each treatment. Be sure to read this information carefully each time. Talk to your care team about the use of this medication in children. While it may be prescribed for children as young as 12 years for selected conditions, precautions do apply. Overdosage: If you think you have taken too much of this medicine contact a poison control center or emergency room at once. NOTE: This medicine is only for you. Do not share this medicine with others. What if I miss a dose? Keep appointments for follow-up doses. It is important not to miss your dose. Call your care team if you are unable to keep an appointment. What may interact with this medication? Interactions have not been studied. This list may not describe all possible interactions. Give your health care provider a list of all the medicines, herbs, non-prescription drugs, or dietary supplements you use. Also tell them if you smoke, drink alcohol, or use illegal drugs. Some items may interact with your medicine. What should I watch for while using this medication? Your condition will be monitored carefully while you are receiving this medication. You may need blood work while taking this medication. This medication may cause serious skin reactions. They can happen weeks to months after starting the medication. Contact your care team right away if you notice fevers or flu-like symptoms with a rash. The rash may be red or purple and then turn into blisters or peeling of the skin. You may also notice a red rash with swelling of the face, lips, or lymph nodes in  your neck or under your arms. Tell your care team right away if you have any change in your eyesight. Talk to your care team if you are pregnant or think you might be pregnant. A negative pregnancy test is required before starting this medication. A reliable form of contraception is recommended while taking this medication and for 5 months after the last dose. Talk to your care team about effective forms of contraception. Do not breast-feed while taking this medication and for 5 months after the last dose. What side effects may I notice from receiving this medication? Side effects that you should report to your care team as soon as possible: Allergic reactions--skin rash, itching, hives, swelling of the face, lips, tongue, or throat Dry cough, shortness of breath or trouble breathing Eye pain, redness, irritation, or discharge with blurry or decreased vision Heart muscle inflammation--unusual weakness or fatigue, shortness of breath, chest pain, fast or irregular heartbeat, dizziness, swelling of the ankles, feet, or hands Hormone gland problems--headache, sensitivity to light, unusual weakness or fatigue, dizziness, fast or irregular heartbeat, increased sensitivity to cold or heat, excessive sweating, constipation, hair loss, increased thirst or amount of urine, tremors or shaking, irritability Infusion reactions--chest pain, shortness of breath or trouble breathing, feeling faint or lightheaded Kidney injury (glomerulonephritis)--decrease in  the amount of urine, red or dark brown urine, foamy or bubbly urine, swelling of the ankles, hands, or feet Liver injury--right upper belly pain, loss of appetite, nausea, light-colored stool, dark yellow or brown urine, yellowing skin or eyes, unusual weakness or fatigue Pain, tingling, or numbness in the hands or feet, muscle weakness, change in vision, confusion or trouble speaking, loss of balance or coordination, trouble walking, seizures Rash, fever, and  swollen lymph nodes Redness, blistering, peeling, or loosening of the skin, including inside the mouth Sudden or severe stomach pain, bloody diarrhea, fever, nausea, vomiting Side effects that usually do not require medical attention (report these to your care team if they continue or are bothersome): Bone, joint, or muscle pain Diarrhea Fatigue Loss of appetite Nausea Skin rash This list may not describe all possible side effects. Call your doctor for medical advice about side effects. You may report side effects to FDA at 1-800-FDA-1088. Where should I keep my medication? This medication is given in a hospital or clinic. It will not be stored at home. NOTE: This sheet is a summary. It may not cover all possible information. If you have questions about this medicine, talk to your doctor, pharmacist, or health care provider.  2023 Elsevier/Gold Standard (2021-05-11 00:00:00)  Oxaliplatin Injection What is this medication? OXALIPLATIN (ox AL i PLA tin) treats some types of cancer. It works by slowing down the growth of cancer cells. This medicine may be used for other purposes; ask your health care provider or pharmacist if you have questions. COMMON BRAND NAME(S): Eloxatin What should I tell my care team before I take this medication? They need to know if you have any of these conditions: Heart disease History of irregular heartbeat or rhythm Liver disease Low blood cell levels (white cells, red cells, and platelets) Lung or breathing disease, such as asthma Take medications that treat or prevent blood clots Tingling of the fingers, toes, or other nerve disorder An unusual or allergic reaction to oxaliplatin, other medications, foods, dyes, or preservatives If you or your partner are pregnant or trying to get pregnant Breast-feeding How should I use this medication? This medication is injected into a vein. It is given by your care team in a hospital or clinic setting. Talk to your  care team about the use of this medication in children. Special care may be needed. Overdosage: If you think you have taken too much of this medicine contact a poison control center or emergency room at once. NOTE: This medicine is only for you. Do not share this medicine with others. What if I miss a dose? Keep appointments for follow-up doses. It is important not to miss a dose. Call your care team if you are unable to keep an appointment. What may interact with this medication? Do not take this medication with any of the following: Cisapride Dronedarone Pimozide Thioridazine This medication may also interact with the following: Aspirin and aspirin-like medications Certain medications that treat or prevent blood clots, such as warfarin, apixaban, dabigatran, and rivaroxaban Cisplatin Cyclosporine Diuretics Medications for infection, such as acyclovir, adefovir, amphotericin B, bacitracin, cidofovir, foscarnet, ganciclovir, gentamicin, pentamidine, vancomycin NSAIDs, medications for pain and inflammation, such as ibuprofen or naproxen Other medications that cause heart rhythm changes Pamidronate Zoledronic acid This list may not describe all possible interactions. Give your health care provider a list of all the medicines, herbs, non-prescription drugs, or dietary supplements you use. Also tell them if you smoke, drink alcohol, or use illegal drugs. Some  items may interact with your medicine. What should I watch for while using this medication? Your condition will be monitored carefully while you are receiving this medication. You may need blood work while taking this medication. This medication may make you feel generally unwell. This is not uncommon as chemotherapy can affect healthy cells as well as cancer cells. Report any side effects. Continue your course of treatment even though you feel ill unless your care team tells you to stop. This medication may increase your risk of getting  an infection. Call your care team for advice if you get a fever, chills, sore throat, or other symptoms of a cold or flu. Do not treat yourself. Try to avoid being around people who are sick. Avoid taking medications that contain aspirin, acetaminophen, ibuprofen, naproxen, or ketoprofen unless instructed by your care team. These medications may hide a fever. Be careful brushing or flossing your teeth or using a toothpick because you may get an infection or bleed more easily. If you have any dental work done, tell your dentist you are receiving this medication. This medication can make you more sensitive to cold. Do not drink cold drinks or use ice. Cover exposed skin before coming in contact with cold temperatures or cold objects. When out in cold weather wear warm clothing and cover your mouth and nose to warm the air that goes into your lungs. Tell your care team if you get sensitive to the cold. Talk to your care team if you or your partner are pregnant or think either of you might be pregnant. This medication can cause serious birth defects if taken during pregnancy and for 9 months after the last dose. A negative pregnancy test is required before starting this medication. A reliable form of contraception is recommended while taking this medication and for 9 months after the last dose. Talk to your care team about effective forms of contraception. Do not father a child while taking this medication and for 6 months after the last dose. Use a condom while having sex during this time period. Do not breastfeed while taking this medication and for 3 months after the last dose. This medication may cause infertility. Talk to your care team if you are concerned about your fertility. What side effects may I notice from receiving this medication? Side effects that you should report to your care team as soon as possible: Allergic reactions--skin rash, itching, hives, swelling of the face, lips, tongue, or  throat Bleeding--bloody or black, tar-like stools, vomiting blood or brown material that looks like coffee grounds, red or dark brown urine, small red or purple spots on skin, unusual bruising or bleeding Dry cough, shortness of breath or trouble breathing Heart rhythm changes--fast or irregular heartbeat, dizziness, feeling faint or lightheaded, chest pain, trouble breathing Infection--fever, chills, cough, sore throat, wounds that don't heal, pain or trouble when passing urine, general feeling of discomfort or being unwell Liver injury--right upper belly pain, loss of appetite, nausea, light-colored stool, dark yellow or brown urine, yellowing skin or eyes, unusual weakness or fatigue Low red blood cell level--unusual weakness or fatigue, dizziness, headache, trouble breathing Muscle injury--unusual weakness or fatigue, muscle pain, dark yellow or brown urine, decrease in amount of urine Pain, tingling, or numbness in the hands or feet Sudden and severe headache, confusion, change in vision, seizures, which may be signs of posterior reversible encephalopathy syndrome (PRES) Unusual bruising or bleeding Side effects that usually do not require medical attention (report to your care team  if they continue or are bothersome): Diarrhea Nausea Pain, redness, or swelling with sores inside the mouth or throat Unusual weakness or fatigue Vomiting This list may not describe all possible side effects. Call your doctor for medical advice about side effects. You may report side effects to FDA at 1-800-FDA-1088. Where should I keep my medication? This medication is given in a hospital or clinic. It will not be stored at home. NOTE: This sheet is a summary. It may not cover all possible information. If you have questions about this medicine, talk to your doctor, pharmacist, or health care provider.  2023 Elsevier/Gold Standard (2007-03-04 00:00:00)  Leucovorin Injection What is this  medication? LEUCOVORIN (loo koe VOR in) prevents side effects from certain medications, such as methotrexate. It works by increasing folate levels. This helps protect healthy cells in your body. It may also be used to treat anemia caused by low levels of folate. It can also be used with fluorouracil, a type of chemotherapy, to treat colorectal cancer. It works by increasing the effects of fluorouracil in the body. This medicine may be used for other purposes; ask your health care provider or pharmacist if you have questions. What should I tell my care team before I take this medication? They need to know if you have any of these conditions: Anemia from low levels of vitamin B12 in the blood An unusual or allergic reaction to leucovorin, folic acid, other medications, foods, dyes, or preservatives Pregnant or trying to get pregnant Breastfeeding How should I use this medication? This medication is injected into a vein or a muscle. It is given by your care team in a hospital or clinic setting. Talk to your care team about the use of this medication in children. Special care may be needed. Overdosage: If you think you have taken too much of this medicine contact a poison control center or emergency room at once. NOTE: This medicine is only for you. Do not share this medicine with others. What if I miss a dose? Keep appointments for follow-up doses. It is important not to miss your dose. Call your care team if you are unable to keep an appointment. What may interact with this medication? Capecitabine Fluorouracil Phenobarbital Phenytoin Primidone Trimethoprim;sulfamethoxazole This list may not describe all possible interactions. Give your health care provider a list of all the medicines, herbs, non-prescription drugs, or dietary supplements you use. Also tell them if you smoke, drink alcohol, or use illegal drugs. Some items may interact with your medicine. What should I watch for while using  this medication? Your condition will be monitored carefully while you are receiving this medication. This medication may increase the side effects of 5-fluorouracil. Tell your care team if you have diarrhea or mouth sores that do not get better or that get worse. What side effects may I notice from receiving this medication? Side effects that you should report to your care team as soon as possible: Allergic reactions--skin rash, itching, hives, swelling of the face, lips, tongue, or throat This list may not describe all possible side effects. Call your doctor for medical advice about side effects. You may report side effects to FDA at 1-800-FDA-1088. Where should I keep my medication? This medication is given in a hospital or clinic. It will not be stored at home. NOTE: This sheet is a summary. It may not cover all possible information. If you have questions about this medicine, talk to your doctor, pharmacist, or health care provider.  2023 Elsevier/Gold Standard (  2021-06-16 00:00:00)  Fluorouracil Injection What is this medication? FLUOROURACIL (flure oh YOOR a sil) treats some types of cancer. It works by slowing down the growth of cancer cells. This medicine may be used for other purposes; ask your health care provider or pharmacist if you have questions. COMMON BRAND NAME(S): Adrucil What should I tell my care team before I take this medication? They need to know if you have any of these conditions: Blood disorders Dihydropyrimidine dehydrogenase (DPD) deficiency Infection, such as chickenpox, cold sores, herpes Kidney disease Liver disease Poor nutrition Recent or ongoing radiation therapy An unusual or allergic reaction to fluorouracil, other medications, foods, dyes, or preservatives If you or your partner are pregnant or trying to get pregnant Breast-feeding How should I use this medication? This medication is injected into a vein. It is administered by your care team in a  hospital or clinic setting. Talk to your care team about the use of this medication in children. Special care may be needed. Overdosage: If you think you have taken too much of this medicine contact a poison control center or emergency room at once. NOTE: This medicine is only for you. Do not share this medicine with others. What if I miss a dose? Keep appointments for follow-up doses. It is important not to miss your dose. Call your care team if you are unable to keep an appointment. What may interact with this medication? Do not take this medication with any of the following: Live virus vaccines This medication may also interact with the following: Medications that treat or prevent blood clots, such as warfarin, enoxaparin, dalteparin This list may not describe all possible interactions. Give your health care provider a list of all the medicines, herbs, non-prescription drugs, or dietary supplements you use. Also tell them if you smoke, drink alcohol, or use illegal drugs. Some items may interact with your medicine. What should I watch for while using this medication? Your condition will be monitored carefully while you are receiving this medication. This medication may make you feel generally unwell. This is not uncommon as chemotherapy can affect healthy cells as well as cancer cells. Report any side effects. Continue your course of treatment even though you feel ill unless your care team tells you to stop. In some cases, you may be given additional medications to help with side effects. Follow all directions for their use. This medication may increase your risk of getting an infection. Call your care team for advice if you get a fever, chills, sore throat, or other symptoms of a cold or flu. Do not treat yourself. Try to avoid being around people who are sick. This medication may increase your risk to bruise or bleed. Call your care team if you notice any unusual bleeding. Be careful brushing  or flossing your teeth or using a toothpick because you may get an infection or bleed more easily. If you have any dental work done, tell your dentist you are receiving this medication. Avoid taking medications that contain aspirin, acetaminophen, ibuprofen, naproxen, or ketoprofen unless instructed by your care team. These medications may hide a fever. Do not treat diarrhea with over the counter products. Contact your care team if you have diarrhea that lasts more than 2 days or if it is severe and watery. This medication can make you more sensitive to the sun. Keep out of the sun. If you cannot avoid being in the sun, wear protective clothing and sunscreen. Do not use sun lamps, tanning beds, or  tanning booths. Talk to your care team if you or your partner wish to become pregnant or think you might be pregnant. This medication can cause serious birth defects if taken during pregnancy and for 3 months after the last dose. A reliable form of contraception is recommended while taking this medication and for 3 months after the last dose. Talk to your care team about effective forms of contraception. Do not father a child while taking this medication and for 3 months after the last dose. Use a condom while having sex during this time period. Do not breastfeed while taking this medication. This medication may cause infertility. Talk to your care team if you are concerned about your fertility. What side effects may I notice from receiving this medication? Side effects that you should report to your care team as soon as possible: Allergic reactions--skin rash, itching, hives, swelling of the face, lips, tongue, or throat Heart attack--pain or tightness in the chest, shoulders, arms, or jaw, nausea, shortness of breath, cold or clammy skin, feeling faint or lightheaded Heart failure--shortness of breath, swelling of the ankles, feet, or hands, sudden weight gain, unusual weakness or fatigue Heart rhythm  changes--fast or irregular heartbeat, dizziness, feeling faint or lightheaded, chest pain, trouble breathing High ammonia level--unusual weakness or fatigue, confusion, loss of appetite, nausea, vomiting, seizures Infection--fever, chills, cough, sore throat, wounds that don't heal, pain or trouble when passing urine, general feeling of discomfort or being unwell Low red blood cell level--unusual weakness or fatigue, dizziness, headache, trouble breathing Pain, tingling, or numbness in the hands or feet, muscle weakness, change in vision, confusion or trouble speaking, loss of balance or coordination, trouble walking, seizures Redness, swelling, and blistering of the skin over hands and feet Severe or prolonged diarrhea Unusual bruising or bleeding Side effects that usually do not require medical attention (report to your care team if they continue or are bothersome): Dry skin Headache Increased tears Nausea Pain, redness, or swelling with sores inside the mouth or throat Sensitivity to light Vomiting This list may not describe all possible side effects. Call your doctor for medical advice about side effects. You may report side effects to FDA at 1-800-FDA-1088. Where should I keep my medication? This medication is given in a hospital or clinic. It will not be stored at home. NOTE: This sheet is a summary. It may not cover all possible information. If you have questions about this medicine, talk to your doctor, pharmacist, or health care provider.  2023 Elsevier/Gold Standard (2021-05-12 00:00:00)  The chemotherapy medication bag should finish at 46 hours, 96 hours, or 7 days. For example, if your pump is scheduled for 46 hours and it was put on at 4:00 p.m., it should finish at 2:00 p.m. the day it is scheduled to come off regardless of your appointment time.     Estimated time to finish at 12:30 p.m. on Thursday 12/24/2021.   If the display on your pump reads "Low Volume" and it is  beeping, take the batteries out of the pump and come to the cancer center for it to be taken off.   If the pump alarms go off prior to the pump reading "Low Volume" then call [phone] and someone can assist you.  If the plunger comes out and the chemotherapy medication is leaking out, please use your home chemo spill kit to clean up the spill. Do NOT use paper towels or other household products.  If you have problems or questions regarding your pump,  please call either [phone] (24 hours a day) or the cancer center Monday-Friday 8:00 a.m.- 4:30 p.m. at the clinic number and we will assist you. If you are unable to get assistance, then go to the nearest Emergency Department and ask the staff to contact the IV team for assistance.

## 2021-12-22 NOTE — Progress Notes (Signed)
Patient seen by Dr. Sherrill today ? ?Vitals are within treatment parameters. ? ?Labs reviewed by Dr. Sherrill and are within treatment parameters. ? ?Per physician team, patient is ready for treatment and there are NO modifications to the treatment plan.  ?

## 2021-12-22 NOTE — Progress Notes (Signed)
Leslie Livingston Directives Clinical Social Work  Holiday representative met with patient to complete Regulatory affairs officer.  The patient designated [name] as their primary healthcare agent.  He did not wish to list a  secondary agent.  Patient also completed healthcare living will.    Documents were notarized and copies made for patient/family. Clinical Social Worker will send documents to medical records to be scanned into patient's chart. Clinical Social Worker encouraged patient/family to contact with any additional questions or concerns.   [name], LCSW Clinical Social Worker [facility]

## 2021-12-23 ENCOUNTER — Other Ambulatory Visit: Payer: Self-pay

## 2021-12-24 ENCOUNTER — Inpatient Hospital Stay: Payer: Medicare PPO

## 2021-12-24 VITALS — BP 106/67 | HR 99 | Temp 97.6°F | Resp 18

## 2021-12-24 DIAGNOSIS — C159 Malignant neoplasm of esophagus, unspecified: Secondary | ICD-10-CM

## 2021-12-24 DIAGNOSIS — Z5112 Encounter for antineoplastic immunotherapy: Secondary | ICD-10-CM | POA: Diagnosis not present

## 2021-12-24 LAB — T4: T4, Total: 9.8 ug/dL (ref 4.5–12.0)

## 2021-12-24 MED ORDER — HEPARIN SOD (PORK) LOCK FLUSH 100 UNIT/ML IV SOLN
500.0000 [IU] | Freq: Once | INTRAVENOUS | Status: AC | PRN
  Administered 2021-12-24: 500 [IU]

## 2021-12-24 MED ORDER — PEGFILGRASTIM-CBQV 6 MG/0.6ML ~~LOC~~ SOSY
6.0000 mg | PREFILLED_SYRINGE | Freq: Once | SUBCUTANEOUS | Status: AC
  Administered 2021-12-24: 6 mg via SUBCUTANEOUS
  Filled 2021-12-24: qty 0.6

## 2021-12-24 MED ORDER — SODIUM CHLORIDE 0.9% FLUSH
10.0000 mL | INTRAVENOUS | Status: DC | PRN
  Administered 2021-12-24: 10 mL

## 2021-12-24 NOTE — Patient Instructions (Signed)

## 2021-12-28 ENCOUNTER — Telehealth: Payer: Self-pay

## 2021-12-28 NOTE — Telephone Encounter (Signed)
Nutrition  Called daughter Leslie Livingston, for nutrition follow-up.  Unable to reach.  Left message with contact information.  Dtr has phone visit set up with RD on 12/5 for follow-up.   Leslie Livingston B. Leslie Livingston, [place], [place] Registered Dietitian [phone]

## 2021-12-29 ENCOUNTER — Telehealth: Payer: Self-pay

## 2021-12-29 ENCOUNTER — Other Ambulatory Visit: Payer: Self-pay | Admitting: Oncology

## 2021-12-29 ENCOUNTER — Ambulatory Visit: Payer: Medicare PPO | Admitting: Nutrition

## 2021-12-29 ENCOUNTER — Encounter: Payer: Self-pay | Admitting: Oncology

## 2021-12-29 ENCOUNTER — Other Ambulatory Visit (HOSPITAL_BASED_OUTPATIENT_CLINIC_OR_DEPARTMENT_OTHER): Payer: Self-pay

## 2021-12-29 ENCOUNTER — Encounter: Payer: Medicare PPO | Admitting: Nutrition

## 2021-12-29 DIAGNOSIS — C153 Malignant neoplasm of upper third of esophagus: Secondary | ICD-10-CM

## 2021-12-29 NOTE — Progress Notes (Signed)
Contacted patient's daughter [name] twice at phone number provided. She did not answer the phone.  I was able to leave a message with a phone number for return call.  This is the sixth documented telephone encounter with no return call.

## 2021-12-29 NOTE — Telephone Encounter (Signed)
Mr Rosanne Sack daughter called and states the patient has a sore throat, low grade fever 99.0 and a cough. He have a white spot in the back of his throat. I advise the patient to go to the urgent care or his pcp to get check for a strep throat or flu. Patient's daughter gave verbal understanding and had no further questions or concerns.

## 2021-12-30 ENCOUNTER — Other Ambulatory Visit (HOSPITAL_BASED_OUTPATIENT_CLINIC_OR_DEPARTMENT_OTHER): Payer: Self-pay

## 2021-12-30 ENCOUNTER — Other Ambulatory Visit: Payer: Self-pay | Admitting: Oncology

## 2021-12-30 DIAGNOSIS — C153 Malignant neoplasm of upper third of esophagus: Secondary | ICD-10-CM

## 2021-12-30 MED ORDER — OXYCODONE HCL 5 MG/5ML PO SOLN
5.0000 mg | Freq: Four times a day (QID) | ORAL | 0 refills | Status: DC | PRN
  Filled 2021-12-30: qty 200, 5d supply, fill #0

## 2021-12-31 ENCOUNTER — Other Ambulatory Visit (HOSPITAL_BASED_OUTPATIENT_CLINIC_OR_DEPARTMENT_OTHER): Payer: Self-pay

## 2022-01-05 ENCOUNTER — Telehealth: Payer: Self-pay

## 2022-01-05 ENCOUNTER — Inpatient Hospital Stay: Payer: Medicare PPO

## 2022-01-05 ENCOUNTER — Inpatient Hospital Stay: Payer: Medicare PPO | Admitting: Nurse Practitioner

## 2022-01-05 NOTE — Telephone Encounter (Signed)
I called Mr. Sawa daughter to check on him because he was late for his appointment. She states he's not feeling well. In the last couple of days, he have been in the bed, he's very weak and fatigue. Only eating about 25%. He have a lot mucus in his throat, cough up phlegm. She was wander if the provider would call him in a prescript to with the phlegm.

## 2022-01-07 ENCOUNTER — Inpatient Hospital Stay: Payer: Medicare PPO

## 2022-01-07 ENCOUNTER — Other Ambulatory Visit (HOSPITAL_COMMUNITY): Payer: Self-pay

## 2022-01-07 ENCOUNTER — Inpatient Hospital Stay (HOSPITAL_BASED_OUTPATIENT_CLINIC_OR_DEPARTMENT_OTHER)
Admission: EM | Admit: 2022-01-07 | Discharge: 2022-01-13 | DRG: 871 | Disposition: A | Discharge status: Skilled Nursing Facility | Payer: Medicare PPO | Source: Ambulatory Visit | Attending: Internal Medicine | Admitting: Internal Medicine

## 2022-01-07 ENCOUNTER — Other Ambulatory Visit: Payer: Self-pay

## 2022-01-07 ENCOUNTER — Inpatient Hospital Stay (HOSPITAL_BASED_OUTPATIENT_CLINIC_OR_DEPARTMENT_OTHER): Payer: Medicare PPO | Admitting: Oncology

## 2022-01-07 ENCOUNTER — Encounter (HOSPITAL_COMMUNITY): Payer: Self-pay

## 2022-01-07 ENCOUNTER — Inpatient Hospital Stay: Payer: Medicare PPO | Attending: Oncology

## 2022-01-07 ENCOUNTER — Encounter (HOSPITAL_BASED_OUTPATIENT_CLINIC_OR_DEPARTMENT_OTHER): Payer: Self-pay | Admitting: Emergency Medicine

## 2022-01-07 ENCOUNTER — Emergency Department (HOSPITAL_BASED_OUTPATIENT_CLINIC_OR_DEPARTMENT_OTHER): Payer: Medicare PPO

## 2022-01-07 ENCOUNTER — Encounter: Payer: Self-pay | Admitting: *Deleted

## 2022-01-07 DIAGNOSIS — R531 Weakness: Secondary | ICD-10-CM

## 2022-01-07 DIAGNOSIS — A419 Sepsis, unspecified organism: Principal | ICD-10-CM | POA: Diagnosis present

## 2022-01-07 DIAGNOSIS — R296 Repeated falls: Secondary | ICD-10-CM | POA: Diagnosis present

## 2022-01-07 DIAGNOSIS — J189 Pneumonia, unspecified organism: Secondary | ICD-10-CM | POA: Diagnosis present

## 2022-01-07 DIAGNOSIS — C159 Malignant neoplasm of esophagus, unspecified: Secondary | ICD-10-CM

## 2022-01-07 DIAGNOSIS — C7951 Secondary malignant neoplasm of bone: Secondary | ICD-10-CM | POA: Diagnosis present

## 2022-01-07 DIAGNOSIS — K219 Gastro-esophageal reflux disease without esophagitis: Secondary | ICD-10-CM | POA: Diagnosis present

## 2022-01-07 DIAGNOSIS — E785 Hyperlipidemia, unspecified: Secondary | ICD-10-CM | POA: Diagnosis present

## 2022-01-07 DIAGNOSIS — Z87891 Personal history of nicotine dependence: Secondary | ICD-10-CM

## 2022-01-07 DIAGNOSIS — I10 Essential (primary) hypertension: Secondary | ICD-10-CM | POA: Diagnosis present

## 2022-01-07 DIAGNOSIS — C153 Malignant neoplasm of upper third of esophagus: Secondary | ICD-10-CM | POA: Diagnosis present

## 2022-01-07 DIAGNOSIS — J90 Pleural effusion, not elsewhere classified: Secondary | ICD-10-CM | POA: Diagnosis present

## 2022-01-07 DIAGNOSIS — D500 Iron deficiency anemia secondary to blood loss (chronic): Secondary | ICD-10-CM | POA: Diagnosis present

## 2022-01-07 DIAGNOSIS — E440 Moderate protein-calorie malnutrition: Secondary | ICD-10-CM | POA: Diagnosis present

## 2022-01-07 DIAGNOSIS — Z931 Gastrostomy status: Secondary | ICD-10-CM

## 2022-01-07 DIAGNOSIS — Z8719 Personal history of other diseases of the digestive system: Secondary | ICD-10-CM

## 2022-01-07 DIAGNOSIS — G9340 Encephalopathy, unspecified: Secondary | ICD-10-CM | POA: Diagnosis present

## 2022-01-07 DIAGNOSIS — R1310 Dysphagia, unspecified: Secondary | ICD-10-CM

## 2022-01-07 DIAGNOSIS — Z8249 Family history of ischemic heart disease and other diseases of the circulatory system: Secondary | ICD-10-CM | POA: Diagnosis not present

## 2022-01-07 DIAGNOSIS — Z6825 Body mass index (BMI) 25.0-25.9, adult: Secondary | ICD-10-CM

## 2022-01-07 LAB — CBC WITH DIFFERENTIAL (CANCER CENTER ONLY)
Abs Immature Granulocytes: 0.4 10*3/uL — ABNORMAL HIGH (ref 0.00–0.07)
Basophils Absolute: 0 10*3/uL (ref 0.0–0.1)
Basophils Relative: 0 %
Eosinophils Absolute: 0.1 10*3/uL (ref 0.0–0.5)
Eosinophils Relative: 0 %
HCT: 31.9 % — ABNORMAL LOW (ref 39.0–52.0)
Hemoglobin: 10.3 g/dL — ABNORMAL LOW (ref 13.0–17.0)
Immature Granulocytes: 3 %
Lymphocytes Relative: 13 %
Lymphs Abs: 1.9 10*3/uL (ref 0.7–4.0)
MCH: 28.1 pg (ref 26.0–34.0)
MCHC: 32.3 g/dL (ref 30.0–36.0)
MCV: 86.9 fL (ref 80.0–100.0)
Monocytes Absolute: 2.7 10*3/uL — ABNORMAL HIGH (ref 0.1–1.0)
Monocytes Relative: 19 %
Neutro Abs: 9.4 10*3/uL — ABNORMAL HIGH (ref 1.7–7.7)
Neutrophils Relative %: 65 %
Platelet Count: 271 10*3/uL (ref 150–400)
RBC: 3.67 MIL/uL — ABNORMAL LOW (ref 4.22–5.81)
RDW: 15.2 % (ref 11.5–15.5)
WBC Count: 14.5 10*3/uL — ABNORMAL HIGH (ref 4.0–10.5)
nRBC: 0 % (ref 0.0–0.2)

## 2022-01-07 LAB — URINALYSIS, ROUTINE W REFLEX MICROSCOPIC
Bilirubin Urine: NEGATIVE
Glucose, UA: NEGATIVE mg/dL
Hgb urine dipstick: NEGATIVE
Ketones, ur: NEGATIVE mg/dL
Leukocytes,Ua: NEGATIVE
Nitrite: NEGATIVE
Specific Gravity, Urine: 1.021 (ref 1.005–1.030)
pH: 5.5 (ref 5.0–8.0)

## 2022-01-07 LAB — COMPREHENSIVE METABOLIC PANEL
ALT: 9 U/L (ref 0–44)
AST: 13 U/L — ABNORMAL LOW (ref 15–41)
Albumin: 3 g/dL — ABNORMAL LOW (ref 3.5–5.0)
Alkaline Phosphatase: 93 U/L (ref 38–126)
Anion gap: 12 (ref 5–15)
BUN: 24 mg/dL — ABNORMAL HIGH (ref 8–23)
CO2: 24 mmol/L (ref 22–32)
Calcium: 9.5 mg/dL (ref 8.9–10.3)
Chloride: 96 mmol/L — ABNORMAL LOW (ref 98–111)
Creatinine, Ser: 0.92 mg/dL (ref 0.61–1.24)
GFR, Estimated: >60 mL/min (ref >60)
Glucose, Bld: 131 mg/dL — ABNORMAL HIGH (ref 70–99)
Potassium: 4.1 mmol/L (ref 3.5–5.1)
Sodium: 132 mmol/L — ABNORMAL LOW (ref 135–145)
Total Bilirubin: 0.9 mg/dL (ref 0.3–1.2)
Total Protein: 7.4 g/dL (ref 6.5–8.1)

## 2022-01-07 LAB — CMP (CANCER CENTER ONLY)
ALT: 9 U/L (ref 0–44)
AST: 12 U/L — ABNORMAL LOW (ref 15–41)
Albumin: 3 g/dL — ABNORMAL LOW (ref 3.5–5.0)
Alkaline Phosphatase: 96 U/L (ref 38–126)
Anion gap: 12 (ref 5–15)
BUN: 23 mg/dL (ref 8–23)
CO2: 25 mmol/L (ref 22–32)
Calcium: 9.4 mg/dL (ref 8.9–10.3)
Chloride: 95 mmol/L — ABNORMAL LOW (ref 98–111)
Creatinine: 0.9 mg/dL (ref 0.61–1.24)
GFR, Estimated: >60 mL/min (ref >60)
Glucose, Bld: 133 mg/dL — ABNORMAL HIGH (ref 70–99)
Potassium: 3.9 mmol/L (ref 3.5–5.1)
Sodium: 132 mmol/L — ABNORMAL LOW (ref 135–145)
Total Bilirubin: 0.8 mg/dL (ref 0.3–1.2)
Total Protein: 7.5 g/dL (ref 6.5–8.1)

## 2022-01-07 LAB — CBC WITH DIFFERENTIAL/PLATELET
Abs Immature Granulocytes: 0.4 10*3/uL — ABNORMAL HIGH (ref 0.00–0.07)
Basophils Absolute: 0 10*3/uL (ref 0.0–0.1)
Basophils Relative: 0 %
Eosinophils Absolute: 0 10*3/uL (ref 0.0–0.5)
Eosinophils Relative: 0 %
HCT: 31.9 % — ABNORMAL LOW (ref 39.0–52.0)
Hemoglobin: 10.1 g/dL — ABNORMAL LOW (ref 13.0–17.0)
Immature Granulocytes: 2 %
Lymphocytes Relative: 10 %
Lymphs Abs: 1.8 10*3/uL (ref 0.7–4.0)
MCH: 27.5 pg (ref 26.0–34.0)
MCHC: 31.7 g/dL (ref 30.0–36.0)
MCV: 86.9 fL (ref 80.0–100.0)
Monocytes Absolute: 3 10*3/uL — ABNORMAL HIGH (ref 0.1–1.0)
Monocytes Relative: 18 %
Neutro Abs: 12 10*3/uL — ABNORMAL HIGH (ref 1.7–7.7)
Neutrophils Relative %: 70 %
Platelets: 288 10*3/uL (ref 150–400)
RBC: 3.67 MIL/uL — ABNORMAL LOW (ref 4.22–5.81)
RDW: 15.4 % (ref 11.5–15.5)
WBC: 17.2 10*3/uL — ABNORMAL HIGH (ref 4.0–10.5)
nRBC: 0 % (ref 0.0–0.2)

## 2022-01-07 LAB — LACTIC ACID, PLASMA: Lactic Acid, Venous: 1.5 mmol/L (ref 0.5–1.9)

## 2022-01-07 LAB — TSH: TSH: 1.165 u[IU]/mL (ref 0.350–4.500)

## 2022-01-07 MED ORDER — MELATONIN 3 MG PO TABS: 3.0000 mg | ORAL_TABLET | Freq: Every evening | ORAL | Status: DC | PRN

## 2022-01-07 MED ORDER — PROCHLORPERAZINE EDISYLATE 10 MG/2ML IJ SOLN: 5.0000 mg | Freq: Four times a day (QID) | INTRAMUSCULAR | Status: DC | PRN

## 2022-01-07 MED ORDER — SODIUM CHLORIDE 0.9 % IV SOLN
500.0000 mg | Freq: Once | INTRAVENOUS | Status: AC
  Administered 2022-01-07: 500 mg via INTRAVENOUS
  Filled 2022-01-07: qty 5

## 2022-01-07 MED ORDER — SODIUM CHLORIDE 0.9 % IV SOLN
500.0000 mg | INTRAVENOUS | Status: DC
  Administered 2022-01-08: 500 mg via INTRAVENOUS
  Filled 2022-01-07 (×2): qty 5

## 2022-01-07 MED ORDER — IPRATROPIUM-ALBUTEROL 0.5-2.5 (3) MG/3ML IN SOLN
RESPIRATORY_TRACT | Status: AC
  Filled 2022-01-07: qty 3

## 2022-01-07 MED ORDER — POLYETHYLENE GLYCOL 3350 17 G PO PACK: 17.0000 g | PACK | Freq: Every day | ORAL | Status: DC | PRN

## 2022-01-07 MED ORDER — SODIUM CHLORIDE 0.9 % IV SOLN
2.0000 g | Freq: Once | INTRAVENOUS | Status: AC
  Administered 2022-01-07: 2 g via INTRAVENOUS
  Filled 2022-01-07: qty 20

## 2022-01-07 MED ORDER — IPRATROPIUM-ALBUTEROL 0.5-2.5 (3) MG/3ML IN SOLN
3.0000 mL | Freq: Four times a day (QID) | RESPIRATORY_TRACT | Status: DC
  Administered 2022-01-07: 3 mL via RESPIRATORY_TRACT

## 2022-01-07 MED ORDER — IPRATROPIUM-ALBUTEROL 0.5-2.5 (3) MG/3ML IN SOLN
3.0000 mL | Freq: Three times a day (TID) | RESPIRATORY_TRACT | Status: DC
  Administered 2022-01-08 – 2022-01-12 (×10): 3 mL via RESPIRATORY_TRACT
  Filled 2022-01-07 (×12): qty 3

## 2022-01-07 MED ORDER — IOHEXOL 350 MG/ML SOLN
100.0000 mL | Freq: Once | INTRAVENOUS | Status: AC | PRN
  Administered 2022-01-07: 60 mL via INTRAVENOUS

## 2022-01-07 MED ORDER — ACETAMINOPHEN 325 MG PO TABS
650.0000 mg | ORAL_TABLET | Freq: Four times a day (QID) | ORAL | Status: DC | PRN
  Administered 2022-01-08: 650 mg via ORAL
  Filled 2022-01-07: qty 2

## 2022-01-07 MED ORDER — GUAIFENESIN 100 MG/5ML PO LIQD: 5.0000 mL | ORAL | Status: DC | PRN

## 2022-01-07 MED ORDER — ALBUTEROL SULFATE (2.5 MG/3ML) 0.083% IN NEBU
5.0000 mg | INHALATION_SOLUTION | Freq: Once | RESPIRATORY_TRACT | Status: AC
  Administered 2022-01-07: 5 mg via RESPIRATORY_TRACT
  Filled 2022-01-07: qty 6

## 2022-01-07 MED ORDER — SODIUM CHLORIDE 0.9 % IV SOLN
2.0000 g | INTRAVENOUS | Status: DC
  Administered 2022-01-08: 2 g via INTRAVENOUS
  Filled 2022-01-07: qty 20

## 2022-01-07 MED ORDER — ENOXAPARIN SODIUM 40 MG/0.4ML IJ SOSY
40.0000 mg | PREFILLED_SYRINGE | Freq: Every day | INTRAMUSCULAR | Status: DC
  Administered 2022-01-08 – 2022-01-12 (×6): 40 mg via SUBCUTANEOUS
  Filled 2022-01-07 (×6): qty 0.4

## 2022-01-07 NOTE — ED Provider Notes (Signed)
[facility] EMERGENCY DEPT Provider Note   CSN: [id number] Arrival date & time: 01/07/22  1120     History  Chief Complaint  Patient presents with   Shortness of Breath    Leslie Livingston is a 71 y.o. male.  The history is provided by the patient. No language interpreter was used.  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  Patient reports that he has had ongoing shortness of breath for about 2 months which has been gradually worsening.  He was seen at his cancer center earlier today and was sent to the ED.  He was noted to be visibly labored with SpO2 91%, placed on nasal cannula 3 L.  Patient reports associated productive cough.  He denies chest pain, fever, chills, blood in stool/dark stools, hematuria, hemoptysis, nausea, vomiting.     Home Medications Prior to Admission medications   Medication Sig Start Date End Date Taking? Authorizing Provider  lidocaine-prilocaine (EMLA) cream Apply 1 Application topically as needed. 08/26/21   Ladell Pier, MD  Nutritional Supplements (FEEDING SUPPLEMENT, OSMOLITE 1.5 CAL,) LIQD Place 1,000 mLs into feeding tube continuous. Rate is 55 cc/hr. 08/16/21   Swayze, Ava, DO  Nutritional Supplements (FEEDING SUPPLEMENT, PROSOURCE TF,) liquid Place 45 mLs into feeding tube 3 (three) times daily. Rate is 55 cc /hr. 08/16/21   Swayze, Ava, DO  ondansetron (ZOFRAN) 8 MG tablet Take 1 tablet (8 mg total) by mouth every 8 (eight) hours as needed (Starting day 4 after chemo as needed for nausea). 10/13/21   Ladell Pier, MD  oxyCODONE (ROXICODONE) 5 MG/5ML solution Place 5-10 mLs (5-10 mg total) into feeding tube every 6 (six) hours as needed for severe pain. 12/30/21   Ladell Pier, MD  pantoprazole sodium (PROTONIX) 40 mg Place 1  packet (40 mg) into feeding tube daily. 10/13/21   Ladell Pier, MD  prochlorperazine (COMPAZINE) 10 MG tablet Take 1 tablet (10 mg total) by mouth every 6 (six) hours as needed for nausea or vomiting. Patient not taking: Reported on 10/27/2021 08/25/21   Ladell Pier, MD  promethazine (PHENERGAN) 6.25 MG/5ML syrup Take 12.5 mg (10 milliliters) via G-Tube every 6 hours as needed for nausea and vomiting. Patient not taking: Reported on 10/27/2021 10/13/21   Ladell Pier, MD  Water For Irrigation, Sterile (FREE WATER) SOLN Place 150 mLs into feeding tube every 4 (four) hours. Rate is 55 cc/ hr. 08/16/21   Swayze, Ava, DO      Allergies    Patient has no known allergies.    Review of Systems   Review of Systems  Respiratory:  Positive for shortness of breath.     Physical Exam Updated Vital Signs BP 104/65   Pulse 92   Temp 97.9 F (36.6 C)   Resp (!) 26   SpO2 92%  Physical Exam Constitutional:      General: He is not in acute distress.    Appearance: He is ill-appearing. He is not toxic-appearing.  HENT:     Head: Normocephalic and atraumatic.  Cardiovascular:     Rate and Rhythm: Regular rhythm. Tachycardia present.     Comments: Port-A-Cath in place on left upper chest without any surrounding erythema or fluctuance. Pulmonary:     Breath sounds: Rales present.     Comments: Respirations somewhat labored, speaking in  short phrases.  Nasal cannula in place.  Coarse crackles appreciated in the right middle lung field. Abdominal:     General: Bowel sounds are normal.     Palpations: Abdomen is soft.     Tenderness: There is no abdominal tenderness.  Musculoskeletal:     Cervical back: Neck supple.     Right lower leg: No edema.     Left lower leg: No edema.  Skin:    General: Skin is warm and dry.  Neurological:     Mental Status: He is alert.    ED Results / Procedures / Treatments   Labs (all labs ordered are listed, but only abnormal results are  displayed) Labs Reviewed  CBC WITH DIFFERENTIAL/PLATELET - Abnormal; Notable for the following components:      Result Value   WBC 17.2 (*)    RBC 3.67 (*)    Hemoglobin 10.1 (*)    HCT 31.9 (*)    Neutro Abs 12.0 (*)    Monocytes Absolute 3.0 (*)    Abs Immature Granulocytes 0.40 (*)    All other components within normal limits  COMPREHENSIVE METABOLIC PANEL - Abnormal; Notable for the following components:   Sodium 132 (*)    Chloride 96 (*)    Glucose, Bld 131 (*)    BUN 24 (*)    Albumin 3.0 (*)    AST 13 (*)    All other components within normal limits  URINALYSIS, ROUTINE W REFLEX MICROSCOPIC - Abnormal; Notable for the following components:   Protein, ur TRACE (*)    All other components within normal limits  CULTURE, BLOOD (ROUTINE X 2)  CULTURE, BLOOD (ROUTINE X 2)  LACTIC ACID, PLASMA    EKG None  Radiology CT Angio Chest PE W/Cm &/Or Wo Cm  Result Date: 01/07/2022 CLINICAL DATA:  Increasing shortness of breath and hypoxemia over the last 2 months. Clinical concern for pulmonary embolism. Metastatic esophageal cancer undergoing chemotherapy. EXAM: CT ANGIOGRAPHY CHEST WITH CONTRAST TECHNIQUE: Multidetector CT imaging of the chest was performed using the standard protocol during bolus administration of intravenous contrast. Multiplanar CT image reconstructions and MIPs were obtained to evaluate the vascular anatomy. RADIATION DOSE REDUCTION: This exam was performed according to the departmental dose-optimization program which includes automated exposure control, adjustment of the mA and/or kV according to patient size and/or use of iterative reconstruction technique. CONTRAST:  7m OMNIPAQUE IOHEXOL 350 MG/ML SOLN COMPARISON:  Chest CT 11/17/2021 and 09/18/2021. FINDINGS: Cardiovascular: The pulmonary arteries are well opacified with contrast to the level of the segmental branches. Evaluation of the subsegmental branches is limited by breathing artifact and peripheral  airspace consolidation. There is no evidence of acute pulmonary embolism. No acute systemic arterial abnormalities are identified. There is mild aortic and coronary artery atherosclerosis. Left subclavian Port-A-Cath extends to the superior aspect of the right atrium. The heart size is normal. There is no pericardial effusion. Mediastinum/Nodes: Suspicion of a new left supraclavicular adenopathy, measuring 1.2 cm short axis on image 14/4. Mildly progressive mediastinal and right hilar adenopathy, including a 1.0 cm precarinal node on image 57/4 and a 1.5 cm right hilar node on image 59/4. 1.2 cm subcarinal node on image 69/4 is similar to the prior study. Diffuse esophageal wall thickening appears unchanged.The thyroid gland and trachea appear unremarkable. Lungs/Pleura: No pleural effusion or pneumothorax. New dense consolidation in the right lower lobe with air bronchograms suspicious for pneumonia. Airspace disease posteriorly in the left upper lobe has also progressed. Multiple underlying  pulmonary nodules are again noted consistent with metastatic disease. Some of these are cavitary. Although most of the nodules that are not obscured by airspace disease are similar to the prior study, there is one in the right upper lobe which appears slightly larger, measuring 2.2 x 1.5 cm on image 24/6. Upper abdomen: No acute findings or definite metastases within the visualized upper abdomen. Hepatic assessment limited. Musculoskeletal/Chest wall: Similar appearance of multiple partially sclerotic osseous metastases, most notable within the T5 vertebral body and left 10th rib. No definite progression of these metastases or pathologic fractures identified. Review of the MIP images confirms the above findings. IMPRESSION: 1. No evidence of acute pulmonary embolism or other acute vascular findings in the chest. 2. New dense consolidation in the right lower lobe with air bronchograms suspicious for pneumonia. Airspace disease  posteriorly in the left upper lobe has also progressed. 3. Mildly progressive mediastinal and right hilar adenopathy with suspected left supraclavicular, suspicious for metastatic disease. The intrathoracic nodes could be reactive based on the presence of presumed multilobar pneumonia. 4. Overall similar appearance of multifocal pulmonary metastases with interval enlargement of at least one nodule in the right upper lobe. 5. Similar appearance of osseous metastatic disease. 6.  Aortic Atherosclerosis (ICD10-I70.0). Electronically Signed   By: Richardean Sale M.D.   On: 01/07/2022 13:03    Procedures Procedures    Medications Ordered in ED Medications  albuterol (PROVENTIL) (2.5 MG/3ML) 0.083% nebulizer solution 5 mg (has no administration in time range)  cefTRIAXone (ROCEPHIN) 2 g in sodium chloride 0.9 % 100 mL IVPB (has no administration in time range)  azithromycin (ZITHROMAX) 500 mg in sodium chloride 0.9 % 250 mL IVPB (has no administration in time range)  iohexol (OMNIPAQUE) 350 MG/ML injection 100 mL (60 mLs Intravenous Contrast Given 01/07/22 1229)    ED Course/ Medical Decision Making/ A&P                           Medical Decision Making Amount and/or Complexity of Data Reviewed Labs: ordered. Radiology: ordered. ECG/medicine tests: ordered.  Risk Prescription drug management.  71 year old male with history of metastatic esophageal cancer undergoing active chemotherapy and prior GI bleed presenting with progressively worsening dyspnea ongoing for about 2 months sent to the ED from his cancer center.  No fever, chills, or chest pain.  No reported evidence of GI bleeding or other causes of bleeding.  He is mildly tachycardic, mildly tachypneic, speaking in shortened phrases but maintaining adequate oxygen saturation on 3 L nasal cannula.  Differential for this condition is broad and includes PE (especially given active cancer treatment), pneumonia, anemia (could be related to  chemotherapy).  Will obtain EKG, basic labs CBC, metabolic panel.  Well score of 5.5, will check CTPA to evaluate for PE.  Labs obtained at his cancer center reviewed, shows mild leukocytosis WBC to 14.5, mild anemia hemoglobin 10.3, unremarkable kidney function and electrolytes.  Labs obtained here with no significant difference compared to outside labs other than increased WBC count 17.2.  CTPA without evidence of PE but does have evidence of right lower lobe pneumonia.  This does correlate with leukocytosis, cough, dyspnea, and lung exam.  Will obtain blood cultures and initiate IV antibiotics for CAP treatment.  Due to meeting sepsis criteria, he would benefit from admission.  PSI score 102 points which correlates to risk class IV, 8.2-9.3% mortality.  Discussed case with hospitalist who will admit patient.  Final Clinical Impression(s) /  ED Diagnoses Final diagnoses:  None    Rx / DC Orders ED Discharge Orders     None         Zola Button, MD 01/07/22 1549    Lajean Saver, MD 01/14/22 1050

## 2022-01-07 NOTE — H&P (Signed)
History and Physical  Leslie Livingston UMP:[id number] DOB: [birth year] DOA: 01/07/2022  Referring physician: Accepted by Dr. Genoveva Ill [facility], hospitalist service. PCP: Leslie Dy, MD  Outpatient Specialists: Oncology Patient coming from: Home.  Chief Complaint: Generalized weakness.  HPI: Leslie Livingston is a 71 y.o. male with medical history significant for metastatic esophageal cancer, post completion of 8 cycles of FOLFOX/nivolumab, failure to thrive, who presented to the ED with complaints of generalized weakness, persistent cough and progressive dyspnea with minimal exertion.  Endorses dysphagia with solids.  Seen by oncology outpatient on the day of his presentation.  Due to concern for possible pneumonia or pulmonary embolism, recommended a CT chest.  The patient was advised to go to the ED for further evaluation.  In the ED, CT chest revealed right lower lobe pulmonary infiltrates suggestive of pneumonia.  Negative for pulmonary embolism.  The patient was started on empiric antibiotic Rocephin and azithromycin.  ED Course: Tmax 99.6.  BP 92/62, pulse 96, respiratory 18, O2 saturation 91% on room air.  Review of Systems: Review of systems as noted in the HPI. All other systems reviewed and are negative.   Past Medical History:  Diagnosis Date   Allergy    Arthritis    Hyperlipidemia    Hypertension    Past Surgical History:  Procedure Laterality Date   BIOPSY  08/07/2021   Procedure: BIOPSY;  Surgeon: Milus Banister, MD;  Location: [facility];  Service: Gastroenterology;;   COLONOSCOPY WITH PROPOFOL N/A 05/05/2018   Procedure: COLONOSCOPY WITH PROPOFOL;  Surgeon: Irene Shipper, MD;  Location: [facility];  Service: Endoscopy;  Laterality: N/A;   ESOPHAGOGASTRODUODENOSCOPY (EGD) WITH PROPOFOL N/A 08/07/2021   Procedure: ESOPHAGOGASTRODUODENOSCOPY (EGD) WITH PROPOFOL;  Surgeon: Milus Banister, MD;  Location: [facility];  Service: Gastroenterology;  Laterality: N/A;    GASTROSTOMY N/A 08/10/2021   Procedure: INSERTION OF GASTROSTOMY TUBE;  Surgeon: Coralie Keens, MD;  Location: [facility];  Service: General;  Laterality: N/A;   IR [place] W/FLUORO  09/16/2021   PORTACATH PLACEMENT N/A 08/10/2021   Procedure: INSERTION PORT-A-CATH;  Surgeon: Coralie Keens, MD;  Location: [facility];  Service: General;  Laterality: N/A;    Social History:  reports that he quit smoking about 7 months ago. His smoking use included cigarettes. He smoked an average of .25 packs per day. He has been exposed to tobacco smoke. He has never used smokeless tobacco. He reports current alcohol use of about 6.0 standard drinks of alcohol per week. He reports that he does not use drugs.   No Known Allergies  Family History  Problem Relation Age of Onset   Hypertension Other    Colon cancer Neg Hx    Stomach cancer Neg Hx    Esophageal cancer Neg Hx    Rectal cancer Neg Hx       Prior to Admission medications   Medication Sig Start Date End Date Taking? Authorizing Provider  lidocaine-prilocaine (EMLA) cream Apply 1 Application topically as needed. 08/26/21   Ladell Pier, MD  Nutritional Supplements (FEEDING SUPPLEMENT, OSMOLITE 1.5 CAL,) LIQD Place 1,000 mLs into feeding tube continuous. Rate is 55 cc/hr. 08/16/21   Swayze, Ava, DO  Nutritional Supplements (FEEDING SUPPLEMENT, PROSOURCE TF,) liquid Place 45 mLs into feeding tube 3 (three) times daily. Rate is 55 cc /hr. 08/16/21   Swayze, Ava, DO  ondansetron (ZOFRAN) 8 MG tablet Take 1 tablet (8 mg total) by mouth every 8 (eight) hours as needed (Starting day  4 after chemo as needed for nausea). 10/13/21   Ladell Pier, MD  oxyCODONE (ROXICODONE) 5 MG/5ML solution Place 5-10 mLs (5-10 mg total) into feeding tube every 6 (six) hours as needed for severe pain. 12/30/21   Ladell Pier, MD  pantoprazole sodium (PROTONIX) 40 mg Place 1 packet (40 mg) into feeding tube daily. 10/13/21   Ladell Pier, MD   prochlorperazine (COMPAZINE) 10 MG tablet Take 1 tablet (10 mg total) by mouth every 6 (six) hours as needed for nausea or vomiting. Patient not taking: Reported on 10/27/2021 08/25/21   Ladell Pier, MD  promethazine (PHENERGAN) 6.25 MG/5ML syrup Take 12.5 mg (10 milliliters) via G-Tube every 6 hours as needed for nausea and vomiting. Patient not taking: Reported on 10/27/2021 10/13/21   Ladell Pier, MD  Water For Irrigation, Sterile (FREE WATER) SOLN Place 150 mLs into feeding tube every 4 (four) hours. Rate is 55 cc/ hr. 08/16/21   Swayze, Ava, DO    Physical Exam: BP (!) 88/68 (BP Location: Right Arm)   Pulse 87   Temp 99.1 F (37.3 C) (Oral)   Resp 16   SpO2 97%   General: 71 y.o. year-old male well developed well nourished in no acute distress.  Alert and oriented x3. Cardiovascular: Regular rate and rhythm with no rubs or gallops.  No thyromegaly or JVD noted.  No lower extremity edema. 2/4 pulses in all 4 extremities. Respiratory: Mild rales at bases.  With no wheezes. Poor inspiratory effort. Abdomen: Soft nontender nondistended with normal bowel sounds x4 quadrants. Muskuloskeletal: No cyanosis, clubbing or edema noted bilaterally Neuro: CN II-XII intact, strength, sensation, reflexes Skin: No ulcerative lesions noted or rashes Psychiatry: Judgement and insight appear normal. Mood is appropriate for condition and setting          Labs on Admission:  Basic Metabolic Panel: Recent Labs  Lab 01/07/22 0827 01/07/22 1136  NA 132* 132*  K 3.9 4.1  CL 95* 96*  CO2 25 24  GLUCOSE 133* 131*  BUN 23 24*  CREATININE 0.90 0.92  CALCIUM 9.4 9.5   Liver Function Tests: Recent Labs  Lab 01/07/22 0827 01/07/22 1136  AST 12* 13*  ALT 9 9  ALKPHOS 96 93  BILITOT 0.8 0.9  PROT 7.5 7.4  ALBUMIN 3.0* 3.0*   No results for input(s): "LIPASE", "AMYLASE" in the last 168 hours. No results for input(s): "AMMONIA" in the last 168 hours. CBC: Recent Labs  Lab  01/07/22 0827 01/07/22 1136  WBC 14.5* 17.2*  NEUTROABS 9.4* 12.0*  HGB 10.3* 10.1*  HCT 31.9* 31.9*  MCV 86.9 86.9  PLT 271 288   Cardiac Enzymes: No results for input(s): "CKTOTAL", "CKMB", "CKMBINDEX", "TROPONINI" in the last 168 hours.  BNP (last 3 results) Recent Labs    09/18/21 1311  BNP 54.7    ProBNP (last 3 results) No results for input(s): "PROBNP" in the last 8760 hours.  CBG: No results for input(s): "GLUCAP" in the last 168 hours.  Radiological Exams on Admission: CT Angio Chest PE W/Cm &/Or Wo Cm  Result Date: 01/07/2022 CLINICAL DATA:  Increasing shortness of breath and hypoxemia over the last 2 months. Clinical concern for pulmonary embolism. Metastatic esophageal cancer undergoing chemotherapy. EXAM: CT ANGIOGRAPHY CHEST WITH CONTRAST TECHNIQUE: Multidetector CT imaging of the chest was performed using the standard protocol during bolus administration of intravenous contrast. Multiplanar CT image reconstructions and MIPs were obtained to evaluate the vascular anatomy. RADIATION DOSE REDUCTION: This exam  was performed according to the departmental dose-optimization program which includes automated exposure control, adjustment of the mA and/or kV according to patient size and/or use of iterative reconstruction technique. CONTRAST:  34m OMNIPAQUE IOHEXOL 350 MG/ML SOLN COMPARISON:  Chest CT 11/17/2021 and 09/18/2021. FINDINGS: Cardiovascular: The pulmonary arteries are well opacified with contrast to the level of the segmental branches. Evaluation of the subsegmental branches is limited by breathing artifact and peripheral airspace consolidation. There is no evidence of acute pulmonary embolism. No acute systemic arterial abnormalities are identified. There is mild aortic and coronary artery atherosclerosis. Left subclavian Port-A-Cath extends to the superior aspect of the right atrium. The heart size is normal. There is no pericardial effusion. Mediastinum/Nodes:  Suspicion of a new left supraclavicular adenopathy, measuring 1.2 cm short axis on image 14/4. Mildly progressive mediastinal and right hilar adenopathy, including a 1.0 cm precarinal node on image 57/4 and a 1.5 cm right hilar node on image 59/4. 1.2 cm subcarinal node on image 69/4 is similar to the prior study. Diffuse esophageal wall thickening appears unchanged.The thyroid gland and trachea appear unremarkable. Lungs/Pleura: No pleural effusion or pneumothorax. New dense consolidation in the right lower lobe with air bronchograms suspicious for pneumonia. Airspace disease posteriorly in the left upper lobe has also progressed. Multiple underlying pulmonary nodules are again noted consistent with metastatic disease. Some of these are cavitary. Although most of the nodules that are not obscured by airspace disease are similar to the prior study, there is one in the right upper lobe which appears slightly larger, measuring 2.2 x 1.5 cm on image 24/6. Upper abdomen: No acute findings or definite metastases within the visualized upper abdomen. Hepatic assessment limited. Musculoskeletal/Chest wall: Similar appearance of multiple partially sclerotic osseous metastases, most notable within the T5 vertebral body and left 10th rib. No definite progression of these metastases or pathologic fractures identified. Review of the MIP images confirms the above findings. IMPRESSION: 1. No evidence of acute pulmonary embolism or other acute vascular findings in the chest. 2. New dense consolidation in the right lower lobe with air bronchograms suspicious for pneumonia. Airspace disease posteriorly in the left upper lobe has also progressed. 3. Mildly progressive mediastinal and right hilar adenopathy with suspected left supraclavicular, suspicious for metastatic disease. The intrathoracic nodes could be reactive based on the presence of presumed multilobar pneumonia. 4. Overall similar appearance of multifocal pulmonary  metastases with interval enlargement of at least one nodule in the right upper lobe. 5. Similar appearance of osseous metastatic disease. 6.  Aortic Atherosclerosis (ICD10-I70.0). Electronically Signed   By: WRichardean SaleM.D.   On: 01/07/2022 13:03    EKG: I independently viewed the EKG done and my findings are as followed: Sinus tachycardia rate of 106.  Nonspecific ST-T changes.  QTc 428.  Assessment/Plan Present on Admission:  Sepsis (HStan[place]  Principal Problem:   Sepsis (HNo Name  Sepsis secondary to right lower lobe pneumonia, seen on CT scan, POA Leukocytosis 17,000, tachypnea 26 Started on empiric IV antibiotics in the ED, continue Rocephin and azithromycin DuoNebs every 6 hours Incentive spirometer Mobilize as tolerated  Metastatic esophageal cancer Overall similar appearance of multifocal pulmonary metastasis with interval enlargement of at least 1 nodule in the right upper lobe.  Similar appearance of osseous metastatic disease. Follows with medical oncology Dr. SLearta Codding Generalized weakness with falls Treat underlying conditions Fall precautions PT OT assessment   DVT prophylaxis: Subcu Lovenox daily  Code Status: Full code  Family Communication: None at bedside  Disposition  Plan: Admitted to progressive care unit.  Consults called: None.  Admission status: Inpatient status.   Status is: Inpatient The patient requires at least 2 midnights for further evaluation and treatment of present condition.   Kayleen Memos MD [facility] Pager [phone]  If 7PM-7AM, please contact night-coverage [email or web address] Password [facility]  01/07/2022, 10:47 PM

## 2022-01-07 NOTE — ED Triage Notes (Signed)
Pt arrives to ED from the cancer center to be evaluated for shortness of breath. When pt arrived for his treatment today he was visibly labored with an initial Sp02 of 91%. He notes worsening SOB x2 months.

## 2022-01-07 NOTE — Progress Notes (Addendum)
@   58 Transferred Leslie Livingston to [facility] ED via W/C in stable conditon for shortness of breath. Needs work up to R/O PE. Leslie Livingston is visibly more short of breath today w/sat 91 % on room air at rest. Improved to 97% on 2 l/min [place]. He coughs frequently with thick secretions, but this is his baseline due to esophageal cancer. He has poor performance status and can barely stand without assistance of 2 staff members. Port needle changed to allow for CT angio chest to be done today. Report provided to Leslie Livingston, [place]. Daughter notified that he is in ED now for workup and will most likely be admitted. She is hopeful he will be admitted.

## 2022-01-07 NOTE — Progress Notes (Signed)
Call received from EDP at [facility].  Patient is a 71 year old male with history of metastatic esophageal cancer followed by oncology Dr. Benay Spice. Last chemo 12/14/2021 Patient presented at his office today with complaint dyspnea, cough, generalized weakness, several falls at home.  He was in visible distress, hypoxic Sent to ED for workup Tachycardic to 110, and labored breathing.   Required 2 to 3 L oxygen by nasal cannula CT angio chest did not show pulm embolism but showed new dense consolidation in the right lower lobe with air bronchograms suspicious for pneumonia.  Multifocal pulmonary metastasis appears stable CBC 17.2, lactic acid 1.5 Blood culture sent In the ED he was given IV Rocephin, IV azithromycin  Will accept for admission in progressive bed for sepsis secondary to pneumonia

## 2022-01-07 NOTE — Progress Notes (Signed)
[facility] OFFICE PROGRESS NOTE   Diagnosis: Esophagus cancer  INTERVAL HISTORY:   Mr. Feigel returns prior to scheduled visit.  He completed another cycle of FOLFOX/nivolumab on 12/22/2021.  No nausea/vomiting, diarrhea, rash, or neuropathy symptoms.  He complains of generalized weakness.  He has experienced several falls at home.  He complains of dyspnea and a cough.  No fever.  Objective:  Vital signs in last 24 hours:  Blood pressure 98/61, pulse (!) 114, temperature 98.2 F (36.8 C), temperature source Oral, resp. rate 19, height _0  (1.854 m), weight 200 lb 12.8 oz (91.1 kg), SpO2 91 %.    HEENT: Thick white coat over the tongue, no buccal thrush Resp: Inspiratory rhonchi at the right posterior chest Cardio: The rate and rhythm GI: No hepatosplenomegaly, left upper abdomen feeding tube site without evidence of infection Vascular: No leg edema Neuro: Alert, hoarse    Portacath/PICC-without erythema  Lab Results:  Lab Results  Component Value Date   WBC 14.5 (H) 01/07/2022   HGB 10.3 (L) 01/07/2022   HCT 31.9 (L) 01/07/2022   MCV 86.9 01/07/2022   PLT 271 01/07/2022   NEUTROABS 9.4 (H) 01/07/2022    CMP  Lab Results  Component Value Date   NA 132 (L) 01/07/2022   K 3.9 01/07/2022   CL 95 (L) 01/07/2022   CO2 25 01/07/2022   GLUCOSE 133 (H) 01/07/2022   BUN 23 01/07/2022   CREATININE 0.90 01/07/2022   CALCIUM 9.4 01/07/2022   PROT 7.5 01/07/2022   ALBUMIN 3.0 (L) 01/07/2022   AST 12 (L) 01/07/2022   ALT 9 01/07/2022   ALKPHOS 96 01/07/2022   BILITOT 0.8 01/07/2022   GFRNONAA >60 01/07/2022   GFRAA >60 05/06/2018     Medications: I have reviewed the patient's current medications.   Assessment/Plan: Metastatic esophageal cancer CT neck 07/29/2021-adenopathy at the thoracic inlet and lower bilateral jugular chains, esophagus mass, left vocal cord paralysis CT chest 07/29/2021-diffuse esophageal wall thickening, most pronounced in  the proximal esophagus, multiple pulmonary nodules, mediastinal and supraclavicular lymphadenopathy Upper endoscopy 08/07/2021-mass/stricture extending from the UES to 30 cm CTs chest, abdomen, and pelvis 08/07/2021-long segment wall thickening of the esophagus, prominent proximally, multiple large supraclavicular mediastinal nodes, bilateral lung nodules,, and upper abdominal nodes similar to remote abdominal CT Biopsy 08/07/2021 showed invasive moderately differentiated squamous cell carcinoma Foundation 1-MSS, tumor mutation burden 7, PD-L1 CPS 10 Esophagus radiation 08/13/2021 - 09/03/2021 Cycle 1 FOLFOX/nivolumab 09/08/2021 Cycle 2 held 09/22/2021 due to neutropenia Cycle 2 FOLFOX/nivolumab 09/29/2021, Udenyca Cycle 3 FOLFOX/nivolumab 10/13/2021, Udenyca Cycle 4 FOLFOX/nivolumab 10/27/2021, Udenyca Cycle 5 FOLFOX/nivolumab 11/10/2021, Udenyca CTs 11/17/2021-unchanged long segment ill-defined circumferential wall thickening of the esophagus; multiple bilateral cavitary pulmonary nodules are slightly diminished in size; increase in size of a nodule in the perihilar posterior left upper lobe with associated postobstructive airspace disease; unchanged enlarged high right paratracheal and low right paraesophageal lymph node; interval sclerosis of multiple osseous metastatic lesions Cycle 6 FOLFOX/nivolumab 11/24/2021, Udenyca Cycle 7 FOLFOX/nivolumab 12/08/2021, Udenyca Cycle 8 FOLFOX/nivolumab 12/22/2021, Udenyca   Solid/liquid dysphagia secondary to #1 -Gastrostomy tube placement 07/31/2021 Weight loss secondary to #1 Odynophagia secondary to #1 History of colon polyps-tubular adenomas History of a diverticular bleed-April 2020 Anemia Thrombocytosis Left subclavian Port-A-Cath placement 08/10/2021 Left upper extremity edema -Doppler ultrasound of left upper extremity 08/13/2021-negative for DVT 11.  Oxaliplatin neuropathy-mild/moderate loss of vibratory sense 12/08/2021, 12/22/2021     Leslie Livingston  has metastatic esophagus cancer.  He has completed 8 cycles  of FOLFOX/nivolumab.  He has developed increased dyspnea, cough, and failure to thrive over the past 2 weeks.  He reports falling several times.  He is hypoxic today.  We will refer him for a CT chest to look for evidence of pneumonia and pulmonary embolism.  I will recommend hospice care if he is confirmed to have progression of metastatic esophagus cancer.  Mr. Hassey indicated he does not wish to enroll in hospice at present.  His insurance company declined approval of a chest CT today.  He will be referred to the emergency room for further evaluation.  He may require hospital admission.    Betsy Coder, MD  01/07/2022  9:38 AM

## 2022-01-07 NOTE — ED Notes (Signed)
Pt minimally assisted with urinal, pt began to urinate before getting urinal so bed linen and pads changed. Urinal output 471m. Pt denies other needs at this time. Updated about pending transfer.

## 2022-01-07 NOTE — ED Notes (Signed)
19:01 Raynelle Fanning will send transport to [facility]ABB(NS)

## 2022-01-08 DIAGNOSIS — A419 Sepsis, unspecified organism: Secondary | ICD-10-CM | POA: Diagnosis not present

## 2022-01-08 LAB — CBC WITH DIFFERENTIAL/PLATELET
Abs Immature Granulocytes: 0.28 10*3/uL — ABNORMAL HIGH (ref 0.00–0.07)
Basophils Absolute: 0.1 10*3/uL (ref 0.0–0.1)
Basophils Relative: 0 %
Eosinophils Absolute: 0 10*3/uL (ref 0.0–0.5)
Eosinophils Relative: 0 %
HCT: 30.8 % — ABNORMAL LOW (ref 39.0–52.0)
Hemoglobin: 9.5 g/dL — ABNORMAL LOW (ref 13.0–17.0)
Immature Granulocytes: 2 %
Lymphocytes Relative: 11 %
Lymphs Abs: 1.3 10*3/uL (ref 0.7–4.0)
MCH: 27.4 pg (ref 26.0–34.0)
MCHC: 30.8 g/dL (ref 30.0–36.0)
MCV: 88.8 fL (ref 80.0–100.0)
Monocytes Absolute: 2.1 10*3/uL — ABNORMAL HIGH (ref 0.1–1.0)
Monocytes Relative: 17 %
Neutro Abs: 8.6 10*3/uL — ABNORMAL HIGH (ref 1.7–7.7)
Neutrophils Relative %: 70 %
Platelets: 266 10*3/uL (ref 150–400)
RBC: 3.47 MIL/uL — ABNORMAL LOW (ref 4.22–5.81)
RDW: 15.4 % (ref 11.5–15.5)
WBC: 12.4 10*3/uL — ABNORMAL HIGH (ref 4.0–10.5)
nRBC: 0 % (ref 0.0–0.2)

## 2022-01-08 LAB — COMPREHENSIVE METABOLIC PANEL
ALT: 12 U/L (ref 0–44)
AST: 18 U/L (ref 15–41)
Albumin: 2.3 g/dL — ABNORMAL LOW (ref 3.5–5.0)
Alkaline Phosphatase: 86 U/L (ref 38–126)
Anion gap: 9 (ref 5–15)
BUN: 18 mg/dL (ref 8–23)
CO2: 28 mmol/L (ref 22–32)
Calcium: 9.1 mg/dL (ref 8.9–10.3)
Chloride: 101 mmol/L (ref 98–111)
Creatinine, Ser: 0.93 mg/dL (ref 0.61–1.24)
GFR, Estimated: >60 mL/min (ref >60)
Glucose, Bld: 118 mg/dL — ABNORMAL HIGH (ref 70–99)
Potassium: 4.1 mmol/L (ref 3.5–5.1)
Sodium: 138 mmol/L (ref 135–145)
Total Bilirubin: 0.7 mg/dL (ref 0.3–1.2)
Total Protein: 7 g/dL (ref 6.5–8.1)

## 2022-01-08 LAB — MAGNESIUM: Magnesium: 2 mg/dL (ref 1.7–2.4)

## 2022-01-08 LAB — AMMONIA: Ammonia: 16 umol/L (ref 9–35)

## 2022-01-08 LAB — PHOSPHORUS: Phosphorus: 3.8 mg/dL (ref 2.5–4.6)

## 2022-01-08 LAB — GLUCOSE, CAPILLARY: Glucose-Capillary: 135 mg/dL — ABNORMAL HIGH (ref 70–99)

## 2022-01-08 MED ORDER — PANTOPRAZOLE SODIUM 40 MG PO TBEC
40.0000 mg | DELAYED_RELEASE_TABLET | Freq: Every day | ORAL | Status: DC
  Filled 2022-01-08: qty 1

## 2022-01-08 MED ORDER — ONDANSETRON HCL 4 MG/2ML IJ SOLN: 4.0000 mg | Freq: Four times a day (QID) | INTRAMUSCULAR | Status: DC | PRN

## 2022-01-08 MED ORDER — PROSOURCE TF20 ENFIT COMPATIBL EN LIQD
60.0000 mL | Freq: Every day | ENTERAL | Status: DC
  Administered 2022-01-08 – 2022-01-13 (×6): 60 mL
  Filled 2022-01-08 (×6): qty 60

## 2022-01-08 MED ORDER — FLORANEX PO PACK
1.0000 g | PACK | Freq: Three times a day (TID) | ORAL | Status: DC
  Administered 2022-01-08 – 2022-01-13 (×16): 1 g
  Filled 2022-01-08 (×17): qty 1

## 2022-01-08 MED ORDER — SODIUM CHLORIDE 0.9% FLUSH
10.0000 mL | Freq: Two times a day (BID) | INTRAVENOUS | Status: DC
  Administered 2022-01-08 – 2022-01-09 (×2): 10 mL

## 2022-01-08 MED ORDER — FAMOTIDINE 40 MG/5ML PO SUSR
20.0000 mg | Freq: Every day | ORAL | Status: DC
  Administered 2022-01-08 – 2022-01-12 (×5): 20 mg
  Filled 2022-01-08 (×6): qty 2.5

## 2022-01-08 MED ORDER — SODIUM CHLORIDE 0.9 % IV SOLN: INTRAVENOUS | Status: DC

## 2022-01-08 MED ORDER — PROCHLORPERAZINE EDISYLATE 10 MG/2ML IJ SOLN: 5.0000 mg | Freq: Four times a day (QID) | INTRAMUSCULAR | Status: DC | PRN

## 2022-01-08 MED ORDER — CHOLESTYRAMINE LIGHT 4 G PO PACK
4.0000 g | PACK | Freq: Once | ORAL | Status: AC
  Administered 2022-01-08: 4 g
  Filled 2022-01-08: qty 1

## 2022-01-08 MED ORDER — LOPERAMIDE HCL 2 MG PO CAPS
2.0000 mg | ORAL_CAPSULE | Freq: Once | ORAL | Status: DC
  Filled 2022-01-08: qty 1

## 2022-01-08 MED ORDER — SODIUM CHLORIDE 0.9% FLUSH
10.0000 mL | INTRAVENOUS | Status: DC | PRN
  Administered 2022-01-13: 10 mL

## 2022-01-08 MED ORDER — CHLORHEXIDINE GLUCONATE CLOTH 2 % EX PADS
6.0000 | MEDICATED_PAD | Freq: Every day | CUTANEOUS | Status: DC
  Administered 2022-01-09 – 2022-01-13 (×5): 6 via TOPICAL

## 2022-01-08 MED ORDER — OSMOLITE 1.5 CAL PO LIQD
1000.0000 mL | ORAL | Status: DC
  Administered 2022-01-08 – 2022-01-13 (×6): 1000 mL
  Filled 2022-01-08 (×6): qty 1000

## 2022-01-08 NOTE — Evaluation (Signed)
Physical Therapy Evaluation Patient Details Name: Leslie Livingston MRN: [id number] DOB: March 05, 1950 Today's Date: 01/08/2022  History of Present Illness  Pt is a 71 y/o M admitted on 01/07/22 after presenting with c/o generalized weakness, persistent cough & progressive dyspnea with minimal exertion. Chest CT revealed RLL infiltrates suggestive of PNA, negative for PE. PMH: metastatic esophageal CA, failure to thrive  Clinical Impression  Pt seen for PT evaluation with co-tx with OT. Prior to admission pt was ambulatory with PRN use of QC. On this date, pt reports urgent need to have BM & upon ambulating to bathroom was incontinent. Pt requires total assist for peri hygiene & clean up. Pt appears to be mobilizing well with walker but does demonstrate decreased safety awareness. If pt's family/friends can provide 24 hr supervision, recommend pt d/c home with HHPT. Pt is unsafe to d/c home alone, so if family cannot provide appropriate level of care, pt will require SNF rehab upon d/c.     Recommendations for follow up therapy are one component of a multi-disciplinary discharge planning process, led by the attending physician.  Recommendations may be updated based on patient status, additional functional criteria and insurance authorization.  Follow Up Recommendations Home health PT      Assistance Recommended at Discharge Frequent or constant Supervision/Assistance  Patient can return home with the following  A little help with walking and/or transfers;A little help with bathing/dressing/bathroom;Assistance with cooking/housework;Assist for transportation;Help with stairs or ramp for entrance    Equipment Recommendations Rolling walker (2 wheels)  Recommendations for Other Services       Functional Status Assessment Patient has had a recent decline in their functional status and demonstrates the ability to make significant improvements in function in a reasonable and predictable amount of  time.     Precautions / Restrictions Precautions Precautions: Fall Restrictions Weight Bearing Restrictions: No      Mobility  Bed Mobility Overal bed mobility: Needs Assistance Bed Mobility: Supine to Sit     Supine to sit: Min assist, HOB elevated          Transfers Overall transfer level: Needs assistance Equipment used: Rolling walker (2 wheels) Transfers: Sit to/from Stand Sit to Stand: Min assist           General transfer comment: STS from EOB & toilet    Ambulation/Gait Ambulation/Gait assistance: Min assist Gait Distance (Feet): 10 Feet (+ 10 ft) Assistive device: Rolling walker (2 wheels), 2 person hand held assist Gait Pattern/deviations: Decreased step length - left, Decreased step length - right, Decreased dorsiflexion - right, Decreased dorsiflexion - left, Decreased stride length Gait velocity: decreased.     General Gait Details: Pt ambulates bed>bathroom with RW & min assist, toilet>recliner with BUE HHA with min assist +2.  Stairs            Wheelchair Mobility    Modified Rankin (Stroke Patients Only)       Balance Overall balance assessment: Needs assistance Sitting-balance support: Bilateral upper extremity supported, Feet supported Sitting balance-Leahy Scale: Good Sitting balance - Comments: supervision static sitting   Standing balance support: Bilateral upper extremity supported, During functional activity, Single extremity supported Standing balance-Leahy Scale: Fair                               Pertinent Vitals/Pain Pain Assessment Pain Assessment: No/denies pain    Home Living Family/patient expects to be discharged to:: Private residence Living Arrangements:  Children Available Help at Discharge: Family;Friend(s);Available PRN/intermittently Type of Home: House Home Access: Stairs to enter Entrance Stairs-Rails: Psychiatric nurse of Steps: 3-4   Home Layout: One level Home  Equipment: Cane - quad;Grab bars - tub/shower      Prior Function Prior Level of Function : Independent/Modified Independent             Mobility Comments: using SBQC for distance only 2* long-term R hip and knee discomfort ADLs Comments: Pt reports no difficulty with ADL prior to admit     Hand Dominance        Extremity/Trunk Assessment   Upper Extremity Assessment Upper Extremity Assessment: Overall WFL for tasks assessed    Lower Extremity Assessment Lower Extremity Assessment: Generalized weakness       Communication   Communication: No difficulties  Cognition Arousal/Alertness: Awake/alert Behavior During Therapy: WFL for tasks assessed/performed Overall Cognitive Status: No family/caregiver present to determine baseline cognitive functioning                                 General Comments: Pt with delayed responses to all questions. Incontinent of BM during session.        General Comments General comments (skin integrity, edema, etc.): Pt with incontinent liquid BM, requires total assist for peri hygiene & changing clothes.    Exercises     Assessment/Plan    PT Assessment Patient needs continued PT services  PT Problem List Decreased strength;Decreased activity tolerance;Decreased balance;Decreased mobility;Decreased knowledge of use of DME;Decreased safety awareness       PT Treatment Interventions DME instruction;Therapeutic exercise;Gait training;Balance training;Stair training;Neuromuscular re-education;Functional mobility training;Cognitive remediation;Therapeutic activities;Patient/family education    PT Goals (Current goals can be found in the Care Plan section)  Acute Rehab PT Goals Patient Stated Goal: none stated PT Goal Formulation: With patient Time For Goal Achievement: 01/22/22 Potential to Achieve Goals: Good    Frequency Min 3X/week     Co-evaluation PT/OT/SLP Co-Evaluation/Treatment: Yes Reason for  Co-Treatment: Complexity of the patient's impairments (multi-system involvement);Necessary to address cognition/behavior during functional activity;For patient/therapist safety PT goals addressed during session: Balance;Mobility/safety with mobility;Proper use of DME         AM-PAC PT "6 Clicks" Mobility  Outcome Measure Help needed turning from your back to your side while in a flat bed without using bedrails?: A Little Help needed moving from lying on your back to sitting on the side of a flat bed without using bedrails?: A Little Help needed moving to and from a bed to a chair (including a wheelchair)?: A Little Help needed standing up from a chair using your arms (e.g., wheelchair or bedside chair)?: A Little Help needed to walk in hospital room?: A Little Help needed climbing 3-5 steps with a railing? : A Lot 6 Click Score: 17    End of Session   Activity Tolerance: Patient tolerated treatment well Patient left: in chair;with nursing/sitter in room Nurse Communication: Mobility status PT Visit Diagnosis: Muscle weakness (generalized) (M62.81);Unsteadiness on feet (R26.81)    Time: 5170-0174 PT Time Calculation (min) (ACUTE ONLY): 24 min   Charges:   PT Evaluation $PT Eval High Complexity: 1 High          Lavone Nian, PT, DPT 01/08/22, 2:10 PM   Waunita Schooner 01/08/2022, 2:07 PM

## 2022-01-08 NOTE — TOC Initial Note (Addendum)
Transition of Care [facility]) - Initial/Assessment Note    Patient Details  Name: Leslie Livingston MRN: [id number] Date of Birth: 24-Oct-1950  [facility] CM/SW Contact:    Dessa Phi, RN Phone Number: 01/08/2022, 3:38 PM  Clinical Narrative:  left vm w/LaTonya(dtr) vm-await call back.Will need to confirm services @ home.Left message w/[place]-await response if following for [facility] services. Noted rw ordered-[facility] following.Continue to monitor.                 Expected Discharge Plan: [place] Barriers to Discharge: Continued Medical Work up   Patient Goals and CMS Choice Patient states their goals for this hospitalization and ongoing recovery are::  (Home) CMS Medicare.gov Compare Post Acute Care list provided to:: Patient Represenative (must comment) (Latonya(dtr)) Choice offered to / list presented to : Adult Children  Expected Discharge Plan and Services Expected Discharge Plan: [place]   Discharge Planning Services: CM Consult Post Acute Care Choice: [place] arrangements for the past 2 months: [place]                                      Prior Living Arrangements/Services Living arrangements for the past 2 months: Single Family Home Lives with:: Adult Children   Do you feel safe going back to the place where you live?: Yes               Activities of Daily Living Home Assistive Devices/Equipment: Cane (specify quad or straight) ADL Screening (condition at time of admission) Patient's cognitive ability adequate to safely complete daily activities?: Yes Is the patient deaf or have difficulty hearing?: No Does the patient have difficulty seeing, even when wearing glasses/contacts?: No Does the patient have difficulty concentrating, remembering, or making decisions?: Yes Patient able to express need for assistance with ADLs?: Yes Does the patient have difficulty dressing or bathing?:  Yes Independently performs ADLs?: Yes (appropriate for developmental age) Does the patient have difficulty walking or climbing stairs?: Yes Weakness of Legs: Both Weakness of Arms/Hands: Both  Permission Sought/Granted Permission sought to share information with : Case Manager                Emotional Assessment              Admission diagnosis:  Sepsis ([place]) [A41.9] Sepsis due to pneumonia ([place]) [J18.9, A41.9] Patient Active Problem List   Diagnosis Date Noted   Sepsis ([place]) 01/07/2022   Malnutrition of moderate degree 08/11/2021   Cancer of proximal third of esophagus ([place]) 08/11/2021   Dysphagia 08/11/2021   Squamous cell carcinoma, 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 [place] [address] Phone: [phone] Fax: [phone]     Social Determinants of  Health (SDOH) Interventions    Readmission Risk Interventions    01/08/2022   12:32 PM  Readmission Risk Prevention Plan  Transportation Screening Complete  PCP or Specialist Appt within 3-5 Days Complete  HRI or [place] Complete  Social Work Consult for [place] Planning/Counseling Complete  Palliative Care Screening Complete  Medication Review Press photographer) Complete

## 2022-01-08 NOTE — Evaluation (Signed)
Clinical/Bedside Swallow Evaluation Patient Details  Name: Leslie Livingston MRN: [id number] Date of Birth: 1950-11-27  Today's Date: 01/08/2022 Time: SLP Start Time (ACUTE ONLY): 1435 SLP Stop Time (ACUTE ONLY): 1451 SLP Time Calculation (min) (ACUTE ONLY): 16 min  Past Medical History:  Past Medical History:  Diagnosis Date   Allergy    Arthritis    Hyperlipidemia    Hypertension    Past Surgical History:  Past Surgical History:  Procedure Laterality Date   BIOPSY  08/07/2021   Procedure: BIOPSY;  Surgeon: Milus Banister, MD;  Location: [facility];  Service: Gastroenterology;;   COLONOSCOPY WITH PROPOFOL N/A 05/05/2018   Procedure: COLONOSCOPY WITH PROPOFOL;  Surgeon: Irene Shipper, MD;  Location: [facility];  Service: Endoscopy;  Laterality: N/A;   ESOPHAGOGASTRODUODENOSCOPY (EGD) WITH PROPOFOL N/A 08/07/2021   Procedure: ESOPHAGOGASTRODUODENOSCOPY (EGD) WITH PROPOFOL;  Surgeon: Milus Banister, MD;  Location: [facility] ENDOSCOPY;  Service: Gastroenterology;  Laterality: N/A;   GASTROSTOMY N/A 08/10/2021   Procedure: INSERTION OF GASTROSTOMY TUBE;  Surgeon: Coralie Keens, MD;  Location: [facility] ORS;  Service: General;  Laterality: N/A;   IR [place] GASTRO/COLONIC TUBE PERCUT W/FLUORO  09/16/2021   PORTACATH PLACEMENT N/A 08/10/2021   Procedure: INSERTION PORT-A-CATH;  Surgeon: Coralie Keens, MD;  Location: [facility]S;  Service: General;  Laterality: N/A;   HPI:  Pt is a 71 yo male adm to [facility] with shortness of breath, pt has h/o metastatic esophageal cancer and is receiving chemo tx - last 12/22/2021.  He has a Photographer for nutrition. CT chest showed few dense consolidation in the right lower lobe with air  bronchograms suspicious for pneumonia. Airspace disease posteriorly  in the left upper lobe has also progressed.  Dr Carin Hock note advised hospice if pt's esophageal cancer had progressed but pt does not wish to have hospice.  Per chart review, pt also with left vocal cord paresis.  He  reports ongoing dysphagia to solids more than liquids.  Recently pt with poor intake, complains of sore throat, white patches in throat and congestion.  Swallow eval ordered. Gleaned from oncology note, pt was on po diet.    Assessment / Plan / Recommendation  Clinical Impression  Patient presents with clinical indications concerning for impaired oral secretion management - requiring oral suction.   Observed pt consuming small amount of ice chips, water, chicken noodle soup and jello.  He demonstrated wincing with swallowing with ice cold items - but clinically tolerated room temperature without pain.  Multiple swallows across all boluses noted concerning for pharyngeal or esophageal retention.  Given pt has h/o vocal cord paresis, SLP can not rule out component of prandial silent aspiration.  spoke to MD and plan MBS tomorrow am to assure pt is not overtly silently aspirating from pharyngeal dysphagia. Order place and xray, RN made awre. SLP Visit Diagnosis: Dysphagia, unspecified (R13.10)    Aspiration Risk  Moderate aspiration risk    Diet Recommendation NPO;Ice chips PRN after oral care        Other  Recommendations Oral Care Recommendations: Oral care BID Other Recommendations: Have oral suction available    Recommendations for follow up therapy are one component of a multi-disciplinary discharge planning process, led by the attending physician.  Recommendations may be updated based on patient status, additional functional criteria and insurance authorization.  Follow up Recommendations Follow physician's recommendations for discharge plan and follow up therapies      Assistance Recommended at Discharge  Full  Functional Status Assessment Patient has  had a recent decline in their functional status and/or demonstrates limited ability to make significant improvements in function in a reasonable and predictable amount of time  Frequency and Duration min 1 x/week  1 week        Prognosis Prognosis for Safe Diet Advancement: Fair      Swallow Study   General HPI: Pt is a 71 yo male adm [facility] with shortness of breath, pt has h/o metastatic esophageal cancer and is receiving chemo tx - last 12/22/2021.  He has a Photographer for nutrition. CT chest showed few dense consolidation in the right lower lobe with air  bronchograms suspicious for pneumonia. Airspace disease posteriorly  in the left upper lobe has also progressed.  Dr Carin Hock note advised hospice if pt's esophageal cancer had progressed but pt does not wish to have hospice.  Per chart review, pt also with left vocal cord paresis.  He reports ongoing dysphagia to solids more than liquids.  Recently pt with poor intake, complains of sore throat, white patches in throat and congestion.  Swallow eval ordered. Gleaned from oncology note, pt was on po diet. Type of Study: Bedside Swallow Evaluation Previous Swallow Assessment: see HPI- prior endoscopy Diet Prior to this Study: NPO Temperature Spikes Noted: N/A Respiratory Status: Room air History of Recent Intubation: No Behavior/Cognition: Alert Oral Cavity Assessment: Dry Oral Care Completed by SLP: No Oral Cavity - Dentition: Adequate natural dentition Vision: Functional for self-feeding Self-Feeding Abilities: Able to feed self Patient Positioning: Upright in bed Baseline Vocal Quality: Hoarse Volitional Cough: Weak Volitional Swallow: Able to elicit    Oral/Motor/Sensory Function Overall Oral Motor/Sensory Function: Within functional limits   Ice Chips Ice chips: Within functional limits Presentation: Spoon   Thin Liquid Thin Liquid: Impaired Presentation: Spoon;Cup Pharyngeal  Phase Impairments: Multiple swallows;Other (comments) Other Comments: ? odynophagia    Nectar Thick Nectar Thick Liquid: Not tested Other Comments: pt declined to consume Ensure   Honey Thick Honey Thick Liquid: Not tested   Puree Puree: Not tested   Solid     Solid:  Impaired Presentation: Spoon;Self Fed Pharyngeal Phase Impairments: Multiple swallows Other Comments: soup- chicken noodle      Leslie Livingston 01/08/2022,3:15 PM Kathleen Lime, MS [place] Office [phone] Pager [phone]

## 2022-01-08 NOTE — Hospital Course (Signed)
PMH of metastatic esophageal cancer on tube feedings via PEG tube, failure to thrive, HTN, HLD present to the hospital complains of fatigue, confusion and shortness of breath.  Currently being treated for pneumonia.

## 2022-01-08 NOTE — Evaluation (Signed)
SLP Cancellation Note  Patient Details Name: Leslie Livingston MRN: [id number] DOB: [birth year]   Cancelled treatment:       Reason Eval/Treat Not Completed: Other (comment) (pt sound asleep, congested breathing, will follow up later today when pt more alert)  Kathleen Lime, MS [place] Office [phone] Pager [phone]   Macario Golds 01/08/2022, 1:14 PM

## 2022-01-08 NOTE — Progress Notes (Signed)
[facility] Progress Note Patient: Leslie Livingston:[id number] DOB: Jul 15, 1950 DOA: 01/07/2022  DOS: the patient was seen and examined on 01/08/2022  Brief hospital course: PMH of metastatic esophageal cancer on tube feedings via PEG tube, failure to thrive, HTN, HLD present to the hospital complains of fatigue, confusion and shortness of breath.  Currently being treated for pneumonia. Assessment and Plan: Sepsis secondary to right lower lobe pneumonia, seen on CT scan, POA Leukocytosis 17,000, tachypnea 26 Started on empiric IV antibiotics in the ED, continue Rocephin and azithromycin DuoNebs every 6 hours Incentive spirometer Mobilize as tolerated Speech therapy following.  Dysphagia. Speech therapy following. MBS tomorrow. NPO.   Metastatic esophageal cancer Overall similar appearance of multifocal pulmonary metastasis with interval enlargement of at least 1 nodule in the right upper lobe.  Similar appearance of osseous metastatic disease. Follows with medical oncology Dr. Learta Codding   Generalized weakness with falls Treat underlying conditions Fall precautions PT OT assessment  Subjective: Continues to have shortness of breath but no nausea no vomiting no fever no chills.  Appears to be somewhat confused although able to answer questions appropriately.  Physical Exam: General: in mild distress;  Cardiovascular: S1 and S2 Present, no Murmur Respiratory: normal respiratory effort, Bilateral Air entry present, right-sided crackles, no wheezes Abdomen: Bowel Sound present, Non tender  Extremities: no edema Neurology: alert and oriented to time, place, and person   Data Reviewed: I have Reviewed nursing notes, Vitals, and Lab results. Since last encounter, pertinent lab results CBC and BMP   . I have ordered test including CBC and BMP  .   Disposition: Status is: Inpatient Remains inpatient appropriate because: Need for IV antibiotics and further workup  enoxaparin  (LOVENOX) injection 40 mg Start: 01/07/22 2315   Family Communication: Discussed with daughter on the phone Level of care: Progressive Continue progressive care for now.  Vitals:   01/08/22 0833 01/08/22 0940 01/08/22 1254 01/08/22 1359  BP:  (!) 93/53 (!) 95/56 (!) 87/56  Pulse:  100 89 99  Resp: (!) '30 20 18 20  '$ Temp:  98.2 F (36.8 C) 99.7 F (37.6 C) 99.4 F (37.4 C)  TempSrc:  Oral Oral Oral  SpO2: 95% 96% 95% 94%     Author: Berle Mull, MD 01/08/2022 7:52 PM  Please look on [email or web address] to find out who is on call.

## 2022-01-08 NOTE — Progress Notes (Signed)
Initial Nutrition Assessment  DOCUMENTATION CODES:   Non-severe (moderate) malnutrition in context of chronic illness  INTERVENTION:   -Initiate Osmolite 1.5 @ 20 ml/hr via PEG. -MD would like pt to remain on trickle feeds 12/15. -Can advance 12/16 per consult.      -Advance by 10 ml every 12 hours to goal rate of 55 ml/hr. -60 ml Prosource TF daily -Goal rate provides 2140 kcals, 103g protein and 1005 ml H2O  -Once off IVF, recommend free water flushes of 150 ml every 4 hours (900 ml)  If bolus feeds desired at discharge:  -Goal is 6 cartons of Osmolite 1.5 daily. -Provide 1.5 cartons (355 ml) QID via PEG -60 ml Prosource TF daily -Flush with 90 ml before and after each feeding -Provides 2210 kcals, 109g protein and 1806 ml H2O  NUTRITION DIAGNOSIS:   Moderate Malnutrition related to chronic illness, cancer and cancer related treatments as evidenced by mild fat depletion, mild muscle depletion.  GOAL:   Patient will meet greater than or equal to 90% of their needs  MONITOR:   Labs, Weight trends, I & O's, TF tolerance  REASON FOR ASSESSMENT:   Consult Enteral/tube feeding initiation and management  ASSESSMENT:   71 y.o. male with medical history significant for metastatic esophageal cancer, post completion of 8 cycles of FOLFOX/nivolumab, failure to thrive, who presented to the ED with complaints of generalized weakness, persistent cough and progressive dyspnea with minimal exertion.  Endorses dysphagia with solids.  Patient in room, sitting in chair. No family present at bedside. Pt not able to given much history. Was difficult to understand at time (per SLP, h/o vocal paresis) and mumbles his responses to RD. Pt did states he consumes food PO but was unable to elaborate. Pt verified that he has been using his PEG tube at home for feeds, medications and he has been flushing with water. When asked specifically what he has been using at home, he was not able to tell me.   Per chart review, pt has not followed up with [facility] since 10/14/21, with missed phone calls and appointments x 6 since then. Last attempt was 12/5. In September, it was documented pt was administering bolus feeds via PEG of  6.5 cartons Osmolite 1.5 daily. He was getting 3 bottles of water (assuming 8oz), per day. Pt has been using 45 ml Prosource TF at home.   Per chat with SLP, pt to remain NPO given aspiration risk. MBS planned for next date.   Per weight records, pt's weight has been stable.  Medications reviewed.  Labs reviewed.  NUTRITION - FOCUSED PHYSICAL EXAM:  Flowsheet Row Most Recent Value  Orbital Region Mild depletion  Upper Arm Region Moderate depletion  Thoracic and Lumbar Region Mild depletion  Buccal Region Mild depletion  Temple Region Mild depletion  Clavicle Bone Region Mild depletion  Clavicle and Acromion Bone Region Mild depletion  Scapular Bone Region Mild depletion  Dorsal Hand Mild depletion  Patellar Region Mild depletion  Anterior Thigh Region Mild depletion  Posterior Calf Region Moderate depletion  Edema (RD Assessment) None  Hair Reviewed  Eyes Reviewed  Mouth Reviewed  Skin Reviewed  Nails Reviewed       Diet Order:   Diet Order             Diet NPO time specified Except for: Sips with Meds  Diet effective now  EDUCATION NEEDS:   Education needs have been addressed  Skin:  Skin Assessment: Reviewed RN Assessment  Last BM:  12/15- type 7  Height:   Ht Readings from Last 1 Encounters:  01/07/22 '6\' 1"'$  (1.854 m)    Weight:   Wt Readings from Last 1 Encounters:  01/07/22 91.1 kg    BMI:  26.4 kg/m^2  Estimated Nutritional Needs:   Kcal:  2100-2300  Protein:  105-115g  Fluid:  2.1L/day   Clayton Bibles, MS, RD, LDN Inpatient Clinical Dietitian Contact information available via Amion

## 2022-01-08 NOTE — Plan of Care (Signed)
  Problem: Clinical Measurements: Goal: Will remain free from infection Outcome: Progressing   Problem: Activity: Goal: Risk for activity intolerance will decrease Outcome: Progressing   Problem: Coping: Goal: Level of anxiety will decrease Outcome: Progressing   Problem: Safety: Goal: Ability to remain free from injury will improve Outcome: Progressing   

## 2022-01-08 NOTE — Evaluation (Signed)
Occupational Therapy Evaluation Patient Details Name: Leslie Livingston MRN: [id number] DOB: 27-Jul-1950 Today's Date: 01/08/2022   History of Present Illness Pt is a 71 y/o M admitted on 01/07/22 after presenting with c/o generalized weakness, persistent cough & progressive dyspnea with minimal exertion. Chest CT revealed RLL infiltrates suggestive of PNA, negative for PE. PMH: metastatic esophageal CA, failure to thrive   Clinical Impression   Chart reviewed, pt greeted in room agreeable to OT evaluation. Co tx completed with PT on this date. Pt is alert and oriented however increased time required for processing and fair-poor safety awareness throughout. Pt reports he is generally MOD I with ADL, amb community distances with quad cane. Pt presents with deficits in strength, endurance, activity tolerance, balance, cognition, affecting safe and optimal ADL completion. Pt is incontinent of loose stool while amb to bathroom with RW with CGA, says this has happened before; pt requires max assist on this date for clean up. Recommend discharge home with [place] if pt has frequent/constant supervision, if pt does not have assist for ADLs at home STR may be warranted. Pt is left in care of NT, RN aware of pt status. OT will follow acutely.      Recommendations for follow up therapy are one component of a multi-disciplinary discharge planning process, led by the attending physician.  Recommendations may be updated based on patient status, additional functional criteria and insurance authorization.   Follow Up Recommendations  Home health OT (if pt does not have frequent/constant supervision STR may be warrented)     Assistance Recommended at Discharge Frequent or constant Supervision/Assistance  Patient can return home with the following A little help with walking and/or transfers;A lot of help with bathing/dressing/bathroom    Functional Status Assessment  Patient has had a recent decline in their  functional status and demonstrates the ability to make significant improvements in function in a reasonable and predictable amount of time.  Equipment Recommendations  BSC/3in1    Recommendations for Other Services       Precautions / Restrictions Precautions Precautions: Fall Restrictions Weight Bearing Restrictions: No      Mobility Bed Mobility Overal bed mobility: Needs Assistance Bed Mobility: Supine to Sit     Supine to sit: Min assist, HOB elevated          Transfers Overall transfer level: Needs assistance Equipment used: Rolling walker (2 wheels) Transfers: Sit to/from Stand Sit to Stand: Min assist                  Balance Overall balance assessment: Needs assistance Sitting-balance support: Bilateral upper extremity supported, Feet supported Sitting balance-Leahy Scale: Good     Standing balance support: Bilateral upper extremity supported, During functional activity, Single extremity supported Standing balance-Leahy Scale: Fair                             ADL either performed or assessed with clinical judgement   ADL Overall ADL's : Needs assistance/impaired Eating/Feeding: NPO   Grooming: Wash/dry hands;Sitting;Minimal assistance;Cueing for sequencing       Lower Body Bathing: Maximal assistance;Sit to/from stand Lower Body Bathing Details (indicate cue type and reason): after incontinent BM Upper Body Dressing : Minimal assistance;Sitting   Lower Body Dressing: Minimal assistance Lower Body Dressing Details (indicate cue type and reason): socks Toilet Transfer: Min guard;Rolling walker (2 wheels);Cueing for sequencing;Cueing for safety;Ambulation Toilet Transfer Details (indicate cue type and reason): pt with incontient BM  while amb to bathroom Toileting- Clothing Manipulation and Hygiene: Maximal assistance;Sit to/from stand       Functional mobility during ADLs: Min guard;Cueing for safety;Cueing for sequencing;Rolling  walker (2 wheels) (household distances in room)       Vision Patient Visual Report: No change from baseline       Perception     Praxis      Pertinent Vitals/Pain Pain Assessment Pain Assessment: No/denies pain     Hand Dominance     Extremity/Trunk Assessment Upper Extremity Assessment Upper Extremity Assessment: Overall WFL for tasks assessed   Lower Extremity Assessment Lower Extremity Assessment: Generalized weakness       Communication Communication Communication: No difficulties   Cognition Arousal/Alertness: Awake/alert Behavior During Therapy: Flat affect Overall Cognitive Status: No family/caregiver present to determine baseline cognitive functioning Area of Impairment: Attention, Memory, Following commands, Safety/judgement, Awareness, Problem solving                   Current Attention Level: Sustained Memory: Decreased short-term memory Following Commands: Follows one step commands with increased time Safety/Judgement: Decreased awareness of safety, Decreased awareness of deficits Awareness: Emergent Problem Solving: Slow processing, Decreased initiation, Requires verbal cues, Requires tactile cues       General Comments  Attempted to take BP, pt reporting urgent need for toilet, amb to bathroom with incontient BM while amb;    Exercises     Shoulder Instructions      Home Living Family/patient expects to be discharged to:: Private residence Living Arrangements: Children Available Help at Discharge: Family;Friend(s);Available PRN/intermittently Type of Home: House Home Access: Stairs to enter [employer] of Steps: 3-4 Entrance Stairs-Rails: Right;Left Home Layout: One level     Bathroom Shower/Tub: Tub/shower unit         Home Equipment: Cane - quad;Grab bars - tub/shower          Prior Functioning/Environment Prior Level of Function : Independent/Modified Independent             Mobility Comments: using  SBQC for distance only 2* long-term R hip and knee discomfort ADLs Comments: Pt reports no difficulty with ADL prior to admit        OT Problem List: Decreased strength;Decreased activity tolerance;Decreased knowledge of use of DME or AE      OT Treatment/Interventions: Self-care/ADL training;Therapeutic activities;Cognitive remediation/compensation;Therapeutic exercise;Energy conservation;Patient/family education    OT Goals(Current goals can be found in the care plan section) Acute Rehab OT Goals Patient Stated Goal: feel better OT Goal Formulation: With patient Time For Goal Achievement: 01/22/22 Potential to Achieve Goals: Good ADL Goals Pt Will Perform Grooming: with modified independence;sitting;standing Pt Will Perform Lower Body Dressing: with supervision;sit to/from stand Pt Will Transfer to Toilet: with supervision;ambulating Pt Will Perform Toileting - Clothing Manipulation and hygiene: with supervision  OT Frequency: Min 2X/week    Co-evaluation PT/OT/SLP Co-Evaluation/Treatment: Yes Reason for Co-Treatment: Complexity of the patient's impairments (multi-system involvement);Necessary to address cognition/behavior during functional activity;For patient/therapist safety;To address functional/ADL transfers PT goals addressed during session: Balance;Mobility/safety with mobility;Proper use of DME OT goals addressed during session: ADL's and self-care      AM-PAC OT "6 Clicks" Daily Activity     Outcome Measure Help from another person eating meals?: Total (NPO) Help from another person taking care of personal grooming?: A Little Help from another person toileting, which includes using toliet, bedpan, or urinal?: A Lot Help from another person bathing (including washing, rinsing, drying)?: A Lot Help from another person to  put on and taking off regular upper body clothing?: A Little Help from another person to put on and taking off regular lower body clothing?: A Little 6  Click Score: 14   End of Session Equipment Utilized During Treatment: Rolling walker (2 wheels) Nurse Communication: Mobility status  Activity Tolerance: Patient tolerated treatment well Patient left: in chair;with call bell/phone within reach;with nursing/sitter in room  OT Visit Diagnosis: Unsteadiness on feet (R26.81);Muscle weakness (generalized) (M62.81)                Time: 4388-8757 OT Time Calculation (min): 24 min Charges:  OT General Charges $OT Visit: 1 Visit OT Evaluation $OT Eval Moderate Complexity: 1 Mod  Shanon Payor, OTD OTR/L  01/08/22, 3:13 PM

## 2022-01-08 NOTE — Progress Notes (Signed)
  Transition of Care [facility]) Screening Note   Patient Details  Name: Leslie Livingston Date of Birth: September 08, 1950   Transition of Care [facility]) CM/SW Contact:    Dessa Phi, RN Phone Number: 01/08/2022, 12:31 PM    Transition of Care Department [facility]) has reviewed patient and no TOC needs have been identified at this time. We will continue to monitor patient advancement through interdisciplinary progression rounds. If new patient transition needs arise, please place a TOC consult.

## 2022-01-08 NOTE — Progress Notes (Signed)
SLP Cancellation Note  Patient Details Name: Leslie Livingston MRN: [id number] DOB: May 24, 1950   Cancelled treatment:       Reason Eval/Treat Not Completed: Other (comment) (pt sound asleep, congested breathing, will follow up later today when pt more alert)  Kathleen Lime, MS [place] Office [phone] Pager [phone]  Macario Golds 01/08/2022, 1:16 PM

## 2022-01-08 NOTE — Progress Notes (Signed)
This RN went to start pt tube feeding through PEG tube. When RN tried to flush PEG tube, resistance was met. RN noticed small bulge in tubing and was able to remove it. RN recognized it was part of the plug of the PEG tube and placed it in a medicine cup at the bedside computer. PEG tube does not have a plug to cap it off anymore. PEG tube flushes great and feeding was started with no problems once piece was removed. MD notified.

## 2022-01-09 ENCOUNTER — Inpatient Hospital Stay (HOSPITAL_COMMUNITY): Payer: Medicare PPO

## 2022-01-09 DIAGNOSIS — A419 Sepsis, unspecified organism: Secondary | ICD-10-CM | POA: Diagnosis not present

## 2022-01-09 LAB — BASIC METABOLIC PANEL
Anion gap: 8 (ref 5–15)
BUN: 16 mg/dL (ref 8–23)
CO2: 26 mmol/L (ref 22–32)
Calcium: 8.8 mg/dL — ABNORMAL LOW (ref 8.9–10.3)
Chloride: 105 mmol/L (ref 98–111)
Creatinine, Ser: 0.74 mg/dL (ref 0.61–1.24)
GFR, Estimated: >60 mL/min (ref >60)
Glucose, Bld: 144 mg/dL — ABNORMAL HIGH (ref 70–99)
Potassium: 3.7 mmol/L (ref 3.5–5.1)
Sodium: 139 mmol/L (ref 135–145)

## 2022-01-09 LAB — CBC
HCT: 29.6 % — ABNORMAL LOW (ref 39.0–52.0)
Hemoglobin: 9.1 g/dL — ABNORMAL LOW (ref 13.0–17.0)
MCH: 27.7 pg (ref 26.0–34.0)
MCHC: 30.7 g/dL (ref 30.0–36.0)
MCV: 90 fL (ref 80.0–100.0)
Platelets: 250 10*3/uL (ref 150–400)
RBC: 3.29 MIL/uL — ABNORMAL LOW (ref 4.22–5.81)
RDW: 15.6 % — ABNORMAL HIGH (ref 11.5–15.5)
WBC: 10.3 10*3/uL (ref 4.0–10.5)
nRBC: 0 % (ref 0.0–0.2)

## 2022-01-09 LAB — GLUCOSE, CAPILLARY
Glucose-Capillary: 121 mg/dL — ABNORMAL HIGH (ref 70–99)
Glucose-Capillary: 122 mg/dL — ABNORMAL HIGH (ref 70–99)
Glucose-Capillary: 136 mg/dL — ABNORMAL HIGH (ref 70–99)
Glucose-Capillary: 143 mg/dL — ABNORMAL HIGH (ref 70–99)
Glucose-Capillary: 144 mg/dL — ABNORMAL HIGH (ref 70–99)
Glucose-Capillary: 166 mg/dL — ABNORMAL HIGH (ref 70–99)

## 2022-01-09 LAB — PHOSPHORUS: Phosphorus: 2.9 mg/dL (ref 2.5–4.6)

## 2022-01-09 LAB — MAGNESIUM: Magnesium: 2.1 mg/dL (ref 1.7–2.4)

## 2022-01-09 LAB — T4: T4, Total: 10.5 ug/dL (ref 4.5–12.0)

## 2022-01-09 LAB — VITAMIN B12: Vitamin B-12: 2380 pg/mL — ABNORMAL HIGH (ref 180–914)

## 2022-01-09 MED ORDER — GADOBUTROL 1 MMOL/ML IV SOLN
9.0000 mL | Freq: Once | INTRAVENOUS | Status: AC | PRN
  Administered 2022-01-09: 9 mL via INTRAVENOUS

## 2022-01-09 MED ORDER — AMOXICILLIN-POT CLAVULANATE 400-57 MG/5ML PO SUSR
800.0000 mg | Freq: Two times a day (BID) | ORAL | Status: DC
  Administered 2022-01-09 – 2022-01-13 (×9): 800 mg
  Filled 2022-01-09 (×11): qty 10

## 2022-01-09 NOTE — Plan of Care (Signed)
  Problem: Education: Goal: Knowledge of General Education information will improve Description: Including pain rating scale, medication(s)/side effects and non-pharmacologic comfort measures Outcome: Progressing   Problem: Coping: Goal: Level of anxiety will decrease Outcome: Progressing   Problem: Safety: Goal: Ability to remain free from injury will improve Outcome: Progressing   

## 2022-01-09 NOTE — TOC Progression Note (Signed)
Transition of Care [facility]) - Progression Note    Patient Details  Name: DECARLOS EMPEY MRN: [id number] Date of Birth: November 06, 1950  Transition of Care [facility]) CM/SW Contact  Henrietta Dine, RN Phone Number: 01/09/2022, 5:56 PM  Clinical Narrative:    Mora Appl at [place] and he says the agency can provide services for HHPT.   Expected Discharge Plan: [place] Barriers to Discharge: Continued Medical Work up  Expected Discharge Plan and Services Expected Discharge Plan: [place]   Discharge Planning Services: CM Consult Post Acute Care Choice: [place] arrangements for the past 2 months: Single Family Home                                       Social Determinants of Health (SDOH) Interventions    Readmission Risk Interventions    01/08/2022   12:32 PM  Readmission Risk Prevention Plan  Transportation Screening Complete  PCP or Specialist Appt within 3-5 Days Complete  HRI or [place] Complete  Social Work Consult for [place] Planning/Counseling Complete  Palliative Care Screening Complete  Medication Review [employer]) Complete

## 2022-01-09 NOTE — Progress Notes (Signed)
MBS completed, full report to follow.  Patient presents with functional oropharyngeal swallow without aspiration or penetration of any consistency tested.  Slightly prolonged mastication noted with cracker boluses and pt did not follow directions consistently during testing which may incr his aspiration risk.  He does have minimal pharyngeal retention but is able to clear.  Swallow was timely - both orally and reflexive pharyngeal swallow.  If Leslie Livingston is aspirating, potentially it is due to his esophageal cancer and treatment.  SLP tested pt with thin, nectar, pudding and cracker - Did not test barium tablet due to his esophageal cancer.  Regarding oropharyngeal swallow, recommend consider dys3/thin diet with medications via PEG.  Will follow up x1 for pt/family education.  Thanks for this consult.   Kathleen Lime, MS [facility] SLP Acute Rehab Services Office [phone] Pager [phone]

## 2022-01-09 NOTE — Progress Notes (Signed)
[facility] Progress Note Patient: Leslie Livingston YYQ:[id number] DOB: [birth year] DOA: 01/07/2022  DOS: the patient was seen and examined on 01/09/2022  Brief hospital course: PMH of metastatic esophageal cancer on tube feedings via PEG tube, failure to thrive, HTN, HLD present to the hospital complains of fatigue, confusion and shortness of breath.  Currently being treated for pneumonia. Assessment and Plan: Sepsis secondary to right lower lobe pneumonia, seen on CT scan, POA Leukocytosis 17,000, tachypnea 26 Started on empiric IV antibiotics in the ED, continue Rocephin and azithromycin.  Will switch to oral antibiotics. DuoNebs every 6 hours Incentive spirometer Mobilize as tolerated Speech therapy following.   Dysphagia. Speech therapy following. MBS was completed on 12/16.  Patient is able to tolerate oral diet.  Speech therapy recommends D3 diet although patient does not eat or drink anything by mouth at home per daughter and per patient. Maintain NPO for now.  If tolerates tube feeds.  Will discuss with daughter to provide oral nutrition.   Metastatic esophageal cancer Overall similar appearance of multifocal pulmonary metastasis with interval enlargement of at least 1 nodule in the right upper lobe.  Similar appearance of osseous metastatic disease. Follows with medical oncology Dr. Learta Codding   Generalized weakness with falls Encephalopathy including staring spells. Treat underlying conditions Fall precautions MRI brain with and without contrast was performed due to patient's history of metastatic esophageal cancer and daughters report of patient's encephalopathy events at home. Negative for any acute abnormality.  Negative for metastasis.  PT OT assessment recommends home health if the patient has 24/7 assistance. Patient's daughter runs a restaurant and is unable to provide 24/7 assistance.  Patient currently agreeable to consider SNF option.  Will initiate.    Subjective: No nausea no vomiting no fever no chills.  Breathing okay.  No pain.  Physical Exam: General: in mild distress;  Cardiovascular: S1 and S2 Present, no Murmur Respiratory: normal respiratory effort, Bilateral Air entry present, basal Crackles, no wheezes Abdomen: Bowel Sound present, Non tender  Extremities: no edema Neurology: alert and oriented to time, place, and person   Data Reviewed: I have Reviewed nursing notes, Vitals, and Lab results. Since last encounter, pertinent lab results CBC and BMP   . I have ordered test including CBC and BMP  .   Disposition: Status is: Inpatient Remains inpatient appropriate because: Need for safe discharge plan.  Does not have 24/7 assistance at home. enoxaparin (LOVENOX) injection 40 mg Start: 01/07/22 2315   Family Communication: No one at bedside.  Discussed with daughter on 12/15. Level of care: Progressive Switch to telemetry. Vitals:   01/08/22 2024 01/08/22 2026 01/09/22 0542 01/09/22 1430  BP: 98/65  121/66 112/63  Pulse: 94  94 86  Resp: '18  18 16  '$ Temp: 98.8 F (37.1 C)  99.5 F (37.5 C) 97.7 F (36.5 C)  TempSrc: Oral  Oral Oral  SpO2: 96% 95% 96% 97%     Author: Berle Mull, MD 01/09/2022 5:32 PM  Please look on [email or web address] to find out who is on call.

## 2022-01-10 DIAGNOSIS — A419 Sepsis, unspecified organism: Secondary | ICD-10-CM | POA: Diagnosis not present

## 2022-01-10 LAB — GLUCOSE, CAPILLARY
Glucose-Capillary: 132 mg/dL — ABNORMAL HIGH (ref 70–99)
Glucose-Capillary: 133 mg/dL — ABNORMAL HIGH (ref 70–99)
Glucose-Capillary: 135 mg/dL — ABNORMAL HIGH (ref 70–99)
Glucose-Capillary: 138 mg/dL — ABNORMAL HIGH (ref 70–99)
Glucose-Capillary: 139 mg/dL — ABNORMAL HIGH (ref 70–99)
Glucose-Capillary: 147 mg/dL — ABNORMAL HIGH (ref 70–99)

## 2022-01-10 LAB — CBC
HCT: 28.6 % — ABNORMAL LOW (ref 39.0–52.0)
Hemoglobin: 8.6 g/dL — ABNORMAL LOW (ref 13.0–17.0)
MCH: 27.3 pg (ref 26.0–34.0)
MCHC: 30.1 g/dL (ref 30.0–36.0)
MCV: 90.8 fL (ref 80.0–100.0)
Platelets: 248 10*3/uL (ref 150–400)
RBC: 3.15 MIL/uL — ABNORMAL LOW (ref 4.22–5.81)
RDW: 15.5 % (ref 11.5–15.5)
WBC: 10.4 10*3/uL (ref 4.0–10.5)
nRBC: 0 % (ref 0.0–0.2)

## 2022-01-10 LAB — BASIC METABOLIC PANEL
Anion gap: 6 (ref 5–15)
BUN: 11 mg/dL (ref 8–23)
CO2: 22 mmol/L (ref 22–32)
Calcium: 8.3 mg/dL — ABNORMAL LOW (ref 8.9–10.3)
Chloride: 110 mmol/L (ref 98–111)
Creatinine, Ser: 0.64 mg/dL (ref 0.61–1.24)
GFR, Estimated: >60 mL/min (ref >60)
Glucose, Bld: 137 mg/dL — ABNORMAL HIGH (ref 70–99)
Potassium: 3.3 mmol/L — ABNORMAL LOW (ref 3.5–5.1)
Sodium: 138 mmol/L (ref 135–145)

## 2022-01-10 MED ORDER — FREE WATER
150.0000 mL | Status: DC
  Administered 2022-01-10 – 2022-01-13 (×18): 150 mL

## 2022-01-10 MED ORDER — GUAIFENESIN 100 MG/5ML PO LIQD: 5.0000 mL | ORAL | Status: DC | PRN

## 2022-01-10 MED ORDER — MELATONIN 3 MG PO TABS: 3.0000 mg | ORAL_TABLET | Freq: Every evening | ORAL | Status: DC | PRN

## 2022-01-10 MED ORDER — LIP MEDEX EX OINT
TOPICAL_OINTMENT | CUTANEOUS | Status: DC | PRN
  Administered 2022-01-10: 75 via TOPICAL
  Filled 2022-01-10: qty 7

## 2022-01-10 MED ORDER — POTASSIUM CHLORIDE 20 MEQ PO PACK
60.0000 meq | PACK | Freq: Once | ORAL | Status: AC
  Administered 2022-01-10: 60 meq
  Filled 2022-01-10: qty 3

## 2022-01-10 NOTE — Progress Notes (Signed)
[facility] Progress Note Patient: Leslie Livingston ZOX:[id number] DOB: 01-Apr-1950 DOA: 01/07/2022  DOS: the patient was seen and examined on 01/10/2022  Brief hospital course: PMH of metastatic esophageal cancer on tube feedings via PEG tube, failure to thrive, HTN, HLD present to the hospital complains of fatigue, confusion and shortness of breath.  Currently being treated for pneumonia. Assessment and Plan: Sepsis secondary to right lower lobe pneumonia, seen on CT scan, POA Met SIRS criteria on admission. Currently sepsis physiology has resolved. Initially was on IV antibiotics.  Currently on oral antibiotics. Now the patient is on tube feeds, appears to have increased crackles on 12/7.  If persistent tomorrow we will perform a chest x-ray.   Dysphagia. Speech therapy following. MBS was completed on 12/16.  Patient is able to tolerate oral diet.  Speech therapy recommends D3 diet although patient does not eat or drink anything by mouth at home per daughter and per patient. Maintain NPO for now.  If tolerates tube feeds.  Will discuss with daughter to provide oral nutrition.   Metastatic esophageal cancer Overall similar appearance of multifocal pulmonary metastasis with interval enlargement of at least 1 nodule in the right upper lobe.  Similar appearance of osseous metastatic disease. Follows with medical oncology Dr. Learta Codding   Generalized weakness with falls Encephalopathy including staring spells. Treat underlying conditions Fall precautions MRI brain with and without contrast was performed due to patient's history of metastatic esophageal cancer and daughters report of patient's encephalopathy events at home. Negative for any acute abnormality.  Negative for metastasis.  PT OT assessment recommends home health if the patient has 24/7 assistance. Patient's daughter runs a restaurant and is unable to provide 24/7 assistance.  Patient currently agreeable to consider SNF  option.  Will initiate.   Subjective: No acute complaint.  No fever no chills.  Frustrated of being in the hospital.  Physical Exam: General: Appear in mild distress; Cardiovascular: S1 and S2 Present, no Murmur, Respiratory: increased respiratory effort, Bilateral Air entry present, bilateral  Crackles, no wheezes Abdomen: Bowel Sound present, Non tender  Extremities: no Pedal edema Neurology: alert and oriented to time, place, and person   Data Reviewed: I have Reviewed nursing notes, Vitals, and Lab results. Since last encounter, pertinent lab results CBC and BMP   . I have ordered test including CBC and BMP  .    Disposition: Status is: Inpatient Remains inpatient appropriate because: Need for safe discharge plan.  Does not have 24/7 assistance at home. enoxaparin (LOVENOX) injection 40 mg Start: 01/07/22 2315   Family Communication: No one at bedside.  Discussed with daughter on 12/15. Level of care: Telemetry continue telemetry for frequent PVCs. Vitals:   01/10/22 0448 01/10/22 0723 01/10/22 0848 01/10/22 1256  BP:  118/63  127/78  Pulse:  92  84  Resp:  (!) 21  16  Temp:  98.6 F (37 C)  (!) 97.5 F (36.4 C)  TempSrc:  Oral  Oral  SpO2:  100% 93% 100%  Weight: 86 kg     Height: _0  (1.854 m)        Author: Berle Mull, MD 01/10/2022 8:14 PM  Please look on [email or web address] to find out who is on call.

## 2022-01-10 NOTE — Plan of Care (Signed)
  Problem: Clinical Measurements: Goal: Ability to maintain clinical measurements within normal limits will improve Outcome: Progressing Goal: Will remain free from infection Outcome: Progressing   Problem: Nutrition: Goal: Adequate nutrition will be maintained Outcome: Progressing   Problem: Safety: Goal: Ability to remain free from injury will improve Outcome: Progressing   Problem: Skin Integrity: Goal: Risk for impaired skin integrity will decrease Outcome: Progressing

## 2022-01-10 NOTE — Progress Notes (Signed)
Osmolite has been infusing at 80m/hr before the night shift starts and pt tolerates well.

## 2022-01-11 ENCOUNTER — Inpatient Hospital Stay (HOSPITAL_COMMUNITY): Payer: Medicare PPO

## 2022-01-11 DIAGNOSIS — R531 Weakness: Secondary | ICD-10-CM

## 2022-01-11 DIAGNOSIS — C153 Malignant neoplasm of upper third of esophagus: Secondary | ICD-10-CM | POA: Diagnosis not present

## 2022-01-11 DIAGNOSIS — A419 Sepsis, unspecified organism: Secondary | ICD-10-CM | POA: Diagnosis not present

## 2022-01-11 LAB — CBC
HCT: 30 % — ABNORMAL LOW (ref 39.0–52.0)
Hemoglobin: 9.1 g/dL — ABNORMAL LOW (ref 13.0–17.0)
MCH: 27.7 pg (ref 26.0–34.0)
MCHC: 30.3 g/dL (ref 30.0–36.0)
MCV: 91.5 fL (ref 80.0–100.0)
Platelets: 264 10*3/uL (ref 150–400)
RBC: 3.28 MIL/uL — ABNORMAL LOW (ref 4.22–5.81)
RDW: 15.6 % — ABNORMAL HIGH (ref 11.5–15.5)
WBC: 10 10*3/uL (ref 4.0–10.5)
nRBC: 0.2 % (ref 0.0–0.2)

## 2022-01-11 LAB — BASIC METABOLIC PANEL
Anion gap: 5 (ref 5–15)
BUN: 10 mg/dL (ref 8–23)
CO2: 23 mmol/L (ref 22–32)
Calcium: 8.8 mg/dL — ABNORMAL LOW (ref 8.9–10.3)
Chloride: 111 mmol/L (ref 98–111)
Creatinine, Ser: 0.72 mg/dL (ref 0.61–1.24)
GFR, Estimated: >60 mL/min (ref >60)
Glucose, Bld: 131 mg/dL — ABNORMAL HIGH (ref 70–99)
Potassium: 3.5 mmol/L (ref 3.5–5.1)
Sodium: 139 mmol/L (ref 135–145)

## 2022-01-11 LAB — GLUCOSE, CAPILLARY
Glucose-Capillary: 134 mg/dL — ABNORMAL HIGH (ref 70–99)
Glucose-Capillary: 136 mg/dL — ABNORMAL HIGH (ref 70–99)
Glucose-Capillary: 131 mg/dL — ABNORMAL HIGH (ref 70–99)
Glucose-Capillary: 127 mg/dL — ABNORMAL HIGH (ref 70–99)

## 2022-01-11 MED ORDER — AMOXICILLIN-POT CLAVULANATE 400-57 MG/5ML PO SUSR: 800.0000 mg | Freq: Two times a day (BID) | ORAL | 0 refills | Status: AC

## 2022-01-11 MED ORDER — ORAL CARE MOUTH RINSE
15.0000 mL | OROMUCOSAL | Status: DC
  Administered 2022-01-11 – 2022-01-13 (×8): 15 mL via OROMUCOSAL

## 2022-01-11 MED ORDER — GUAIFENESIN 100 MG/5ML PO LIQD: 5.0000 mL | ORAL | 0 refills | Status: DC | PRN

## 2022-01-11 MED ORDER — FAMOTIDINE 40 MG/5ML PO SUSR: 20.0000 mg | Freq: Every day | ORAL | 0 refills | Status: DC

## 2022-01-11 MED ORDER — IPRATROPIUM-ALBUTEROL 0.5-2.5 (3) MG/3ML IN SOLN: 3.0000 mL | Freq: Three times a day (TID) | RESPIRATORY_TRACT | 0 refills | Status: DC

## 2022-01-11 MED ORDER — MELATONIN 3 MG PO TABS: 3.0000 mg | ORAL_TABLET | Freq: Every evening | ORAL | 0 refills | Status: DC | PRN

## 2022-01-11 MED ORDER — ORAL CARE MOUTH RINSE: 15.0000 mL | OROMUCOSAL | Status: DC | PRN

## 2022-01-11 MED ORDER — FLORANEX PO PACK: 1.0000 g | PACK | Freq: Three times a day (TID) | ORAL | 0 refills | Status: DC

## 2022-01-11 MED ORDER — PROSOURCE TF20 ENFIT COMPATIBL EN LIQD: 60.0000 mL | Freq: Every day | ENTERAL | 0 refills | Status: DC

## 2022-01-11 NOTE — Progress Notes (Signed)
Physical Therapy Treatment Patient Details Name: Leslie Livingston MRN: [id number] DOB: 13-Mar-1950 Today's Date: 01/11/2022   History of Present Illness Pt is a 71 y/o M admitted on 01/07/22 after presenting with c/o generalized weakness, persistent cough & progressive dyspnea with minimal exertion. Chest CT revealed RLL infiltrates suggestive of PNA, negative for PE. PMH: metastatic esophageal CA, failure to thrive    PT Comments    Pt stating he feels a little better today. He was able to move around during session with no incontinence of bowels or urine. 9 pt had just gone to the bathroom with nursing). Pt still requires cueing and steady for mobility, however did tolerate more mobility and ambulation today. Pt was independent at baseline and home alone. So in order to achieve those goals recommend SNF since family is not home with him and he is home alone. He has new equipment, feeding tube and still using suction to help manage his secretions, due to all of this feel like he needs more education and rehab to become independent again.     Recommendations for follow up therapy are one component of a multi-disciplinary discharge planning process, led by the attending physician.  Recommendations may be updated based on patient status, additional functional criteria and insurance authorization.  Follow Up Recommendations  Skilled nursing-short term rehab (<3 hours/day) Can patient physically be transported by private vehicle: Yes (maybe, but will need feeding equipment, etc as well, so may need medical transport)   Assistance Recommended at Discharge Frequent or constant Supervision/Assistance  Patient can return home with the following A little help with walking and/or transfers;A little help with bathing/dressing/bathroom;Assistance with cooking/housework;Assist for transportation;Help with stairs or ramp for entrance   Equipment Recommendations  Rolling walker (2 wheels)    Recommendations  for Other Services       Precautions / Restrictions Precautions Precautions: Fall Restrictions Weight Bearing Restrictions: No     Mobility  Bed Mobility Overal bed mobility: Needs Assistance Bed Mobility: Supine to Sit     Supine to sit: Min assist, HOB elevated          Transfers Overall transfer level: Needs assistance Equipment used: Rolling walker (2 wheels) Transfers: Sit to/from Stand Sit to Stand: Min assist                Ambulation/Gait Ambulation/Gait assistance: Min assist Gait Distance (Feet): 150 Feet Assistive device: Rolling walker (2 wheels) Gait Pattern/deviations: Step-through pattern, Decreased stride length Gait velocity: decreased.     General Gait Details: Pt with improved ambualtion today however still with some difficulties with manuvering RW and needs cues for safety. This length was good length some shortness of breath and coughing,   Stairs             Wheelchair Mobility    Modified Rankin (Stroke Patients Only)       Balance Overall balance assessment: Needs assistance Sitting-balance support: Bilateral upper extremity supported, Feet supported Sitting balance-Leahy Scale: Good Sitting balance - Comments: supervision static sitting   Standing balance support: Bilateral upper extremity supported, During functional activity, Single extremity supported Standing balance-Leahy Scale: Fair                              Cognition Arousal/Alertness: Awake/alert Behavior During Therapy: Flat affect  Problem Solving: Slow processing, Decreased initiation, Requires verbal cues, Requires tactile cues          Exercises      General Comments        Pertinent Vitals/Pain Pain Assessment Pain Assessment: Faces Faces Pain Scale: No hurt    Home Living                          Prior Function            PT Goals (current goals can now be  found in the care plan section) Acute Rehab PT Goals Patient Stated Goal: none stated PT Goal Formulation: With patient Time For Goal Achievement: 01/22/22 Potential to Achieve Goals: Good Progress towards PT goals: Progressing toward goals    Frequency    Min 3X/week      PT Plan Current plan remains appropriate    Co-evaluation              AM-PAC PT "6 Clicks" Mobility   Outcome Measure  Help needed turning from your back to your side while in a flat bed without using bedrails?: A Little Help needed moving from lying on your back to sitting on the side of a flat bed without using bedrails?: A Little Help needed moving to and from a bed to a chair (including a wheelchair)?: A Little Help needed standing up from a chair using your arms (e.g., wheelchair or bedside chair)?: A Little Help needed to walk in hospital room?: A Little Help needed climbing 3-5 steps with a railing? : A Little 6 Click Score: 18    End of Session Equipment Utilized During Treatment: Gait belt Activity Tolerance: Patient tolerated treatment well Patient left: in bed;with call bell/phone within reach;with bed alarm set Nurse Communication: Mobility status PT Visit Diagnosis: Muscle weakness (generalized) (M62.81);Unsteadiness on feet (R26.81)     Time: 1610-9604 PT Time Calculation (min) (ACUTE ONLY): 35 min  Charges:  $Gait Training: 8-22 mins                     Leslie Livingston, PT, MPT [place] Office: [phone] If a weekend: [facility] w/e pager [phone] 01/11/2022    Leslie Livingston 01/11/2022, 12:54 PM

## 2022-01-11 NOTE — Discharge Summary (Signed)
Physician Discharge Summary   Patient: Leslie Livingston MRN: [id number] DOB: 1950/02/28  Admit date:     01/07/2022  Discharge date: 01/11/22  Discharge Physician: Berle Mull  PCP: Lorene Dy, MD  Recommendations at discharge: Follow up with PCP in 1 week Passed swallow  eval with MBS, due to aspiration risk recommended to maintain NPO for 1 week with tube feeds but initiate dys 2 diet after that.  Repeat CXR in 4 weeks.            Follow-up Information     Lorene Dy, MD. Schedule an appointment as soon as possible for a visit in 1 week(s).   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  Currently being treated for pneumonia. Assessment and Plan  Sepsis secondary to right lower lobe pneumonia, seen on CT scan, POA Right pleural effusion Met SIRS criteria on admission. Currently sepsis physiology has resolved. Initially was on IV antibiotics.  Currently on oral antibiotics. Now the patient is on tube feeds, appears to have increased crackles on 12/7. Pt does have effusion on repeat x ray but no increased symptoms. Recommended to have a repeat CXR in 4 weeks.    Dysphagia. Speech therapy following. MBS was completed on 12/16.  Patient is able to tolerate oral diet.  Speech therapy recommends D3 diet although patient does not eat or drink anything by mouth at home per daughter  and per patient. Maintain NPO for 1 week. D/w daugheter that the pt passes swallow eval with MBS she agrees to advance the diet to dys 2, resume after 1 week   Metastatic esophageal cancer Overall similar appearance of multifocal pulmonary metastasis with interval enlargement of at least 1 nodule in the right upper lobe.  Similar appearance of osseous metastatic disease. Follows with medical oncology Dr. Learta Codding   Generalized weakness with falls Encephalopathy including staring spells. Treat underlying conditions Fall precautions MRI brain with and without contrast was performed due to patient's history of metastatic esophageal cancer and daughters report of patient's encephalopathy events at home. Negative for any acute abnormality.  Negative for metastasis.   Moderate malnutrition  Body mass index is 25.16 kg/m. Nutrition Problem: Moderate Malnutrition Etiology: chronic illness, cancer and cancer related treatments Nutrition Interventions: Interventions: Tube feeding, Prostat    PT OT assessment recommends home health if the patient has 24/7 assistance. Patient's daughter runs a restaurant and is unable to provide 24/7 assistance.  Patient currently agreeable to consider SNF option.  Will initiate.  Consultants:  none  Procedures performed:  none  DISCHARGE MEDICATION: Allergies as of 01/11/2022   No Known Allergies      Medication List     STOP taking these medications    ondansetron 8 MG tablet Commonly known as: ZOFRAN   oxyCODONE 5 MG/5ML solution Commonly known as: ROXICODONE   pantoprazole sodium 40 mg Commonly known as:  PROTONIX   prochlorperazine 10 MG tablet Commonly known as: COMPAZINE       TAKE these medications    amoxicillin-clavulanate 400-57 MG/5ML suspension Commonly known as: AUGMENTIN Place 10 mLs (800 mg total) into feeding tube every 12 (twelve) hours for 5 days.   famotidine 40 MG/5ML suspension Commonly known as: PEPCID Place  2.5 mLs (20 mg total) into feeding tube at bedtime.   feeding supplement (OSMOLITE 1.5 CAL) Liqd Place 1,000 mLs into feeding tube continuous. Rate is 55 cc/hr. What changed: Another medication with the same name was removed. Continue taking this medication, and follow the directions you see here.   feeding supplement (PROSource TF20) liquid Place 60 mLs into feeding tube daily.   free water Soln Place 150 mLs into feeding tube every 4 (four) hours. Rate is 55 cc/ hr.   guaiFENesin 100 MG/5ML liquid Commonly known as: ROBITUSSIN Place 5 mLs into feeding tube every 4 (four) hours as needed for cough or to loosen phlegm.   ipratropium-albuterol 0.5-2.5 (3) MG/3ML Soln Commonly known as: DUONEB Take 3 mLs by nebulization 3 (three) times daily.   lactobacillus Pack Place 1 packet (1 g total) into feeding tube 3 (three) times daily.   lidocaine-prilocaine cream Commonly known as: EMLA Apply 1 Application topically as needed.   melatonin 3 MG Tabs tablet Place 1 tablet (3 mg total) into feeding tube at bedtime as needed.   promethazine 6.25 MG/5ML syrup Commonly known as: PHENERGAN Take 12.5 mg (10 milliliters) via G-Tube every 6 hours as needed for nausea and vomiting.               Durable Medical Equipment  (From admission, onward)           Start     Ordered   01/08/22 1412  For home use only DME Walker rolling  Once       Question Answer Comment  Walker: With 5 Inch Wheels   Patient needs a walker to treat with the following condition General weakness      01/08/22 1411           Disposition: SNF Diet recommendation: NPO for 1 week, advance to dys 2 diet after that.   Discharge Exam: Vitals:   01/10/22 2048 01/11/22 0418 01/11/22 0420 01/11/22 0821  BP: 107/64 115/66    Pulse: 85 90    Resp: 15 (!) 24    Temp: 99 F (37.2 C) 98 F (36.7 C)    TempSrc: Oral Oral    SpO2: 95% 98%  95%  Weight:   86.5 kg   Height:       General: Appear in no  distress; no visible Abnormal Neck Mass Or lumps, Conjunctiva normal Cardiovascular: S1 and S2 Present, no Murmur, Respiratory: good respiratory effort, Bilateral Air entry present and bilateral basal Crackles, no wheezes Abdomen: Bowel Sound present, Non tender  Extremities: no Pedal edema Neurology: alert and oriented to time, place, and person  Filed Weights   01/10/22 0448 01/11/22 0420  Weight: 86 kg 86.5 kg   Condition at discharge: stable  The results of significant diagnostics from this hospitalization (including imaging, microbiology, ancillary and laboratory) are listed below for reference.   Imaging Studies: DG CHEST PORT 1 VIEW  Result Date: 01/11/2022 CLINICAL DATA:  Pneumonia EXAM: PORTABLE CHEST 1 VIEW COMPARISON:  09/24/2018 FINDINGS: Alveolar opacity identified right mid to lower lung consistent with a developing pneumonia. That could be layering pleural effusion as well. No pneumothorax identified.  Normal pulmonary vasculature. Left-sided Port-A-Cath tip overlies mid SVC. There are thoracic degenerative changes. IMPRESSION: Alveolar opacity right mid to lower lung suggesting pneumonia and possible layering pleural effusion. Electronically Signed   By: Sammie Bench M.D.   On: 01/11/2022 08:59   MR BRAIN W WO CONTRAST  Result Date: 01/09/2022 CLINICAL DATA:  Neuro deficit, acute, stroke suspected. Mental status change. Esophageal cancer. EXAM: MRI HEAD WITHOUT AND WITH CONTRAST TECHNIQUE: Multiplanar, multiecho pulse sequences of the brain and surrounding structures were obtained without and with intravenous contrast. CONTRAST:  29m GADAVIST GADOBUTROL 1 MMOL/ML IV SOLN COMPARISON:  CT head without contrast 11/23/2004. FINDINGS: Brain: No acute infarct, hemorrhage, or mass lesion is present. Remote inferior left cerebellar infarct is present. Advanced atrophy present. Confluent periventricular and scattered subcortical T2 hyperintensities are moderately advanced for age.  Remote ischemic changes are present in the thalami bilaterally. Remote lacunar infarcts are present in the left caudate head and bilateral lentiform nucleus. Mild white matter changes extend in the brainstem. Remote lacunar infarct is present the inferior left pons. The internal auditory canals are within normal limits. The ventricles are proportionate to the degree of atrophy. No significant extraaxial fluid collection is present. The postcontrast images demonstrate no pathologic enhancement to suggest metastatic disease the brain or meninges. Vascular: Flow is present in the major intracranial arteries. Skull and upper cervical spine: The craniocervical junction is normal. Upper cervical spine is within normal limits. Marrow signal is unremarkable. Sinuses/Orbits: A polyp or mucous retention cyst is present left maxillary sinus. The paranasal sinuses and mastoid air cells are otherwise clear. The globes and orbits are within normal limits. IMPRESSION: 1. No acute intracranial abnormality or evidence for metastatic disease to the brain. 2. Advanced atrophy and white matter disease likely reflects the sequela of chronic microvascular ischemia. 3. Remote inferior left cerebellar infarct. 4. Remote lacunar infarcts of the left caudate head, bilateral lentiform nucleus, and inferior left pons. Electronically Signed   By: CSan MorelleM.D.   On: 01/09/2022 16:46   DG Swallowing Func-Speech Pathology  Result Date: 01/09/2022 Table formatting from the original result was not included. Objective Swallowing Evaluation: Type of Study: MBS-Modified Barium Swallow Study  Patient Details Name: RRAYMON SCHLARBMRN: [id number] of Birth: 9Apr 26, 1952Today's Date: 01/09/2022 Time: SLP Start Time (ACUTE ONLY): 0810 -SLP Stop Time (ACUTE ONLY): 05631SLP Time Calculation (min) (ACUTE ONLY): 19 min Past Medical History: Past Medical History: Diagnosis Date  Allergy   Arthritis   Hyperlipidemia   Hypertension  Past  Surgical History: Past Surgical History: Procedure Laterality Date  BIOPSY  08/07/2021  Procedure: BIOPSY;  Surgeon: JMilus Banister MD;  [facility];  Service: Gastroenterology;;  COLONOSCOPY WITH PROPOFOL N/A 05/05/2018  Procedure: COLONOSCOPY WITH PROPOFOL;  Surgeon: PIrene Shipper MD;  [facility]: [facility];  Service: Endoscopy;  Laterality: N/A;  ESOPHAGOGASTRODUODENOSCOPY (EGD) WITH PROPOFOL N/A 08/07/2021  Procedure: ESOPHAGOGASTRODUODENOSCOPY (EGD) WITH PROPOFOL;  Surgeon: JMilus Banister MD;  Location: [facility];  Service: Gastroenterology;  Laterality: N/A;  GASTROSTOMY N/A 08/10/2021  Procedure: INSERTION OF GASTROSTOMY TUBE;  Surgeon: BCoralie Keens MD;  Location: [facility];  Service: General;  Laterality: N/A;  IR REPLC GASTRO/COLONIC TUBE PERCUT W/FLUORO  09/16/2021  PORTACATH PLACEMENT N/A 08/10/2021  Procedure: INSERTION PORT-A-CATH;  Surgeon: BCoralie Keens MD;  Location: [facility];  Service: General;  Laterality: N/A; HPI: [facility] 71[facility] pt has h/o metastatic esophageal cancer and is receiving chemo  tx - last 12/22/2021.  He has a Photographer for nutrition. CT chest showed few dense consolidation in the right lower lobe with air  bronchograms suspicious for pneumonia. Airspace disease posteriorly  in the left upper lobe has also progressed.  Dr Carin Hock note advised hospice if pt's esophageal cancer had progressed but pt does not wish to have hospice.  Per chart review, pt also with left vocal cord paresis.  He reports ongoing dysphagia to solids more than liquids.  Recently pt with poor intake, complains of sore throat, white patches in throat and congestion.  Swallow eval ordered. Gleaned from oncology note, pt was on po diet.  Subjective: pt awake in chair  Recommendations for follow up therapy are one component of a multi-disciplinary discharge planning process, led by the attending physician.  Recommendations may be updated based on patient  status, additional functional criteria and insurance authorization. Assessment / Plan / Recommendation   01/09/2022   9:45 AM Clinical Impressions Clinical Impression Patient presents with functional oropharyngeal swallow without aspiration or penetration of any consistency tested.  Slightly prolonged mastication noted with cracker boluses and pt did not follow directions consistently during testing which may incr his aspiration risk.  He does have minimal pharyngeal retention but is able to clear.  Swallow was timely - both orally and reflexive pharyngeal swallow.  If Mr Rosanne Sack is aspirating, potentially it is due to his esophageal cancer and treatment.  SLP tested pt with thin, nectar, pudding and cracker - Did not test barium tablet due to his esophageal cancer.  Regarding oropharyngeal swallow, recommend consider dys3/thin diet with medications via PEG.  Will follow up x1 for pt/family education.  Thanks for this consult. SLP Visit Diagnosis Dysphagia, unspecified (R13.10) Impact on safety and function Mild aspiration risk     01/09/2022   9:45 AM Treatment Recommendations Treatment Recommendations Therapy as outlined in treatment plan below     01/09/2022   9:47 AM Prognosis Prognosis for Safe Diet Advancement Good   01/09/2022   9:45 AM Diet Recommendations SLP Diet Recommendations Dysphagia 3 (Mech soft) solids;Thin liquid Liquid Administration via Cup;No straw Medication Administration Via alternative means Compensations Slow rate;Small sips/bites Postural Changes Remain semi-upright after after feeds/meals (Comment);Seated upright at 90 degrees     01/09/2022   9:45 AM Other Recommendations Oral Care Recommendations Oral care BID Other Recommendations Have oral suction available Follow Up Recommendations Follow physician's recommendations for discharge plan and follow up therapies Functional Status Assessment Patient has had a recent decline in their functional status and/or demonstrates limited ability  to make significant improvements in function in a reasonable and predictable amount of time   01/09/2022   9:45 AM Frequency and Duration  Speech Therapy Frequency (ACUTE ONLY) min 1 x/week Treatment Duration 1 week     01/09/2022   9:45 AM Oral Phase Oral Phase Impaired Oral - Nectar Cup [facility];Piecemeal swallowing Oral - Thin Cup [facility];Piecemeal swallowing Oral - Thin Straw WFL;Piecemeal swallowing Oral - Puree WFL;Piecemeal swallowing Oral - Mech Soft WFL;Piecemeal swallowing    01/09/2022   9:45 AM Pharyngeal Phase Pharyngeal Phase [facility]    01/09/2022   9:45 AM Cervical Esophageal Phase  Cervical Esophageal Phase WFL Upon esophageal sweep, pt appeared to have small amount of barium retained distally, radiologist not present to confirm and MBS does not diagnose below UES. Kathleen Lime, MS [facility] SLP Acute Rehab Services Office [phone] Pager [phone] Macario Golds 01/09/2022, 9:47 AM  CT Angio Chest PE W/Cm &/Or Wo Cm  Result Date: 01/07/2022 CLINICAL DATA:  Increasing shortness of breath and hypoxemia over the last 2 months. Clinical concern for pulmonary embolism. Metastatic esophageal cancer undergoing chemotherapy. EXAM: CT ANGIOGRAPHY CHEST WITH CONTRAST TECHNIQUE: Multidetector CT imaging of the chest was performed using the standard protocol during bolus administration of intravenous contrast. Multiplanar CT image reconstructions and MIPs were obtained to evaluate the vascular anatomy. RADIATION DOSE REDUCTION: This exam was performed according to the departmental dose-optimization program which includes automated exposure control, adjustment of the mA and/or kV according to patient size and/or use of iterative reconstruction technique. CONTRAST:  30m OMNIPAQUE IOHEXOL 350 MG/ML SOLN COMPARISON:  Chest CT 11/17/2021 and 09/18/2021. FINDINGS: Cardiovascular: The pulmonary arteries are well opacified with contrast to the level of the segmental branches. Evaluation of the subsegmental  branches is limited by breathing artifact and peripheral airspace consolidation. There is no evidence of acute pulmonary embolism. No acute systemic arterial abnormalities are identified. There is mild aortic and coronary artery atherosclerosis. Left subclavian Port-A-Cath extends to the superior aspect of the right atrium. The heart size is normal. There is no pericardial effusion. Mediastinum/Nodes: Suspicion of a new left supraclavicular adenopathy, measuring 1.2 cm short axis on image 14/4. Mildly progressive mediastinal and right hilar adenopathy, including a 1.0 cm precarinal node on image 57/4 and a 1.5 cm right hilar node on image 59/4. 1.2 cm subcarinal node on image 69/4 is similar to the prior study. Diffuse esophageal wall thickening appears unchanged.The thyroid gland and trachea appear unremarkable. Lungs/Pleura: No pleural effusion or pneumothorax. New dense consolidation in the right lower lobe with air bronchograms suspicious for pneumonia. Airspace disease posteriorly in the left upper lobe has also progressed. Multiple underlying pulmonary nodules are again noted consistent with metastatic disease. Some of these are cavitary. Although most of the nodules that are not obscured by airspace disease are similar to the prior study, there is one in the right upper lobe which appears slightly larger, measuring 2.2 x 1.5 cm on image 24/6. Upper abdomen: No acute findings or definite metastases within the visualized upper abdomen. Hepatic assessment limited. Musculoskeletal/Chest wall: Similar appearance of multiple partially sclerotic osseous metastases, most notable within the T5 vertebral body and left 10th rib. No definite progression of these metastases or pathologic fractures identified. Review of the MIP images confirms the above findings. IMPRESSION: 1. No evidence of acute pulmonary embolism or other acute vascular findings in the chest. 2. New dense consolidation in the right lower lobe with air  bronchograms suspicious for pneumonia. Airspace disease posteriorly in the left upper lobe has also progressed. 3. Mildly progressive mediastinal and right hilar adenopathy with suspected left supraclavicular, suspicious for metastatic disease. The intrathoracic nodes could be reactive based on the presence of presumed multilobar pneumonia. 4. Overall similar appearance of multifocal pulmonary metastases with interval enlargement of at least one nodule in the right upper lobe. 5. Similar appearance of osseous metastatic disease. 6.  Aortic Atherosclerosis (ICD10-I70.0). Electronically Signed   By: WRichardean SaleM.D.   On: 01/07/2022 13:03    Microbiology: Results for orders placed or performed during the hospital encounter of 01/07/22  Blood culture (routine x 2)     Status: None (Preliminary result)   Collection Time: 01/07/22  2:15 PM   Specimen: BLOOD  Result Value Ref Range Status   Specimen Description   Final    BLOOD LEFT ANTECUBITAL Performed at [facility], [address] [place]  [address]    Special Requests   Final    Blood Culture adequate volume BOTTLES DRAWN AEROBIC AND ANAEROBIC Performed at [facility], [address], [place], [place] [address]    Culture   Final    NO GROWTH 4 DAYS Performed at [facility], [place] [address], [place], [place] [address]    Report Status PENDING  Incomplete  Blood culture (routine x 2)     Status: None (Preliminary result)   Collection Time: 01/07/22  2:16 PM   Specimen: BLOOD  Result Value Ref Range Status   Specimen Description   Final    BLOOD RIGHT ANTECUBITAL Performed at [facility], [address] [place], [place] [address]    Special Requests   Final    Blood Culture adequate volume BOTTLES DRAWN AEROBIC AND ANAEROBIC Performed at [facility], [address], [place], [place] [address]    Culture   Final    NO GROWTH 4  DAYS Performed at [facility], [place] [address], [place], [place] [address]    Report Status PENDING  Incomplete   Labs: CBC: Recent Labs  Lab 01/07/22 0827 01/07/22 1136 01/08/22 0511 01/09/22 0414 01/10/22 0235 01/11/22 0245  WBC 14.5* 17.2* 12.4* 10.3 10.4 10.0  NEUTROABS 9.4* 12.0* 8.6*  --   --   --   HGB 10.3* 10.1* 9.5* 9.1* 8.6* 9.1*  HCT 31.9* 31.9* 30.8* 29.6* 28.6* 30.0*  MCV 86.9 86.9 88.8 90.0 90.8 91.5  PLT 271 288 266 250 248 858   Basic Metabolic Panel: Recent Labs  Lab 01/07/22 1136 01/08/22 0511 01/09/22 0414 01/10/22 0235 01/11/22 0245  NA 132* 138 139 138 139  K 4.1 4.1 3.7 3.3* 3.5  CL 96* 101 105 110 111  CO2 _0 GLUCOSE 131* 118* 144* 137* 131*  BUN 24* _1 CREATININE 0.92 0.93 0.74 0.64 0.72  CALCIUM 9.5 9.1 8.8* 8.3* 8.8*  MG  --  2.0 2.1  --   --   PHOS  --  3.8 2.9  --   --    Liver Function Tests: Recent Labs  Lab 01/07/22 0827 01/07/22 1136 01/08/22 0511  AST 12* 13* 18  ALT _2 ALKPHOS 96 93 86  BILITOT 0.8 0.9 0.7  PROT 7.5 7.4 7.0  ALBUMIN 3.0* 3.0* 2.3*   CBG: Recent Labs  Lab 01/10/22 1208 01/10/22 1539 01/10/22 2045 01/10/22 2357 01/11/22 0418  GLUCAP 133* 147* 135* 139* 134*    Discharge time spent: greater than 30 minutes.  Signed: Berle Mull, MD [facility]

## 2022-01-11 NOTE — Progress Notes (Addendum)
[facility] PROGRESS NOTE  Patient: Leslie Livingston HGD:[id number]   PCP: Lorene Dy, MD DOB: 06-11-1950   DOA: 01/07/2022   DOS: 01/11/2022    TOC team working to arrange for placement.  PT recommends SNF.  Did not get discharge as currently no SNF available.  Insurance authorization pending.  Author: Berle Mull, MD [facility] 01/11/2022 6:46 PM   If 7PM-7AM, please contact night-coverage at [email or web address]

## 2022-01-11 NOTE — Care Management Important Message (Signed)
Important Message  Patient Details  Name: Leslie Livingston MRN: [id number] Date of Birth: 02-Feb-1950   Medicare Important Message Given:  Yes     Memory Argue 01/11/2022, 2:41 PM

## 2022-01-11 NOTE — TOC Progression Note (Signed)
Transition of Care [facility]) - Progression Note   Patient Details  Name: Leslie Livingston MRN: [id number] Date of Birth: 01-29-1950  Transition of Care [facility]) CM/SW [place], LCSW Phone Number: 01/11/2022, 12:15 PM  Clinical Narrative: TOC notified today that discharge plan is changing from HHPT to SNF. FL2 done; PASRR received. Initial referral faxed out. TOC awaiting updated PT note to complete insurance authorization as previous note is older than 48 hours.  Expected Discharge Plan: [place] Barriers to Discharge: SNF Pending bed offer, Insurance Authorization  Expected Discharge Plan and Services Expected Discharge Plan: [place] In-house Referral: Clinical Social Work Discharge Planning Services: CM Consult Post Acute Care Choice: [place] arrangements for the past 2 months: Single Family Home Expected Discharge Date: 01/11/22               DME Arranged: N/A DME Agency: NA  Social Determinants of Health (SDOH) Interventions    Readmission Risk Interventions    01/08/2022   12:32 PM  Readmission Risk Prevention Plan  Transportation Screening Complete  PCP or Specialist Appt within 3-5 Days Complete  HRI or [place] Complete  Social Work Consult for [place] Planning/Counseling Complete  Palliative Care Screening Complete  Medication Review Press photographer) Complete

## 2022-01-11 NOTE — Progress Notes (Signed)
Mobility Specialist - Progress Note   01/11/22 1444  Mobility  Activity Ambulated with assistance in hallway  Level of Assistance Contact guard assist, steadying assist  Assistive Device Front wheel walker  Distance Ambulated (ft) 425 ft  Activity Response Tolerated well  Mobility Referral Yes  $Mobility charge 1 Mobility   Pt received in brf and agreeable to mobility. No complaints during mobility.  Pt to bed after session with all needs met.    [facility]

## 2022-01-11 NOTE — Progress Notes (Addendum)
IP PROGRESS NOTE  Subjective:   Leslie Livingston is known to me with a history of metastatic esophagus cancer, currently being treated with FOLFOX/nivolumab.  He was referred to the emergency room on 01/07/2022 with dyspnea. A CT chest 01/07/2022 is negative for pulmonary embolism.  Dense consolidation was noted in the right lower lobe suspicious for pneumonia.  Mild progression of mediastinal and hilar adenopathy.  Overall stable multifocal pulmonary metastases with enlargement of 1 right upper lobe nodule. He was admitted placed on IV antibiotics.  Leslie Livingston reports feeling better.  He is waiting on skilled nursing facility placement. Objective: Vital signs in last 24 hours: Blood pressure 122/72, pulse 94, temperature 98 F (36.7 C), temperature source Oral, resp. rate 18, height _0  (1.854 m), weight 190 lb 11.2 oz (86.5 kg), SpO2 98 %.  Intake/Output from previous day: 12/17 0701 - 12/18 0700 In: 780 [NG/GT:780] Out: 700 [Urine:700]  Physical Exam:  HEENT: White coat over the tongue, no buccal thrush Lungs: Expiratory wheeze bilaterally at the anterior chest, the posterior chest is clear, no respiratory distress Cardiac: Regular rate and rhythm Abdomen: No hepatosplenomegaly Extremities: No leg edema  Portacath/PICC-without erythema  Lab Results: Recent Labs    01/10/22 0235 01/11/22 0245  WBC 10.4 10.0  HGB 8.6* 9.1*  HCT 28.6* 30.0*  PLT 248 264    BMET Recent Labs    01/10/22 0235 01/11/22 0245  NA 138 139  K 3.3* 3.5  CL 110 111  CO2 22 23  GLUCOSE 137* 131*  BUN 11 10  CREATININE 0.64 0.72  CALCIUM 8.3* 8.8*    No results found for: "CEA1", "CEA", "HYW737", "CA125"  Studies/Results: DG CHEST PORT 1 VIEW  Result Date: 01/11/2022 CLINICAL DATA:  Pneumonia EXAM: PORTABLE CHEST 1 VIEW COMPARISON:  09/24/2018 FINDINGS: Alveolar opacity identified right mid to lower lung consistent with a developing pneumonia. That could be layering pleural effusion as  well. No pneumothorax identified. Normal pulmonary vasculature. Left-sided Port-A-Cath tip overlies mid SVC. There are thoracic degenerative changes. IMPRESSION: Alveolar opacity right mid to lower lung suggesting pneumonia and possible layering pleural effusion. Electronically Signed   By: Sammie Bench M.D.   On: 01/11/2022 08:59    Medications: I have reviewed the patient's current medications.  Assessment/Plan:  Metastatic esophageal cancer CT neck 07/29/2021-adenopathy at the thoracic inlet and lower bilateral jugular chains, esophagus mass, left vocal cord paralysis CT chest 07/29/2021-diffuse esophageal wall thickening, most pronounced in the proximal esophagus, multiple pulmonary nodules, mediastinal and supraclavicular lymphadenopathy Upper endoscopy 08/07/2021-mass/stricture extending from the UES to 30 cm CTs chest, abdomen, and pelvis 08/07/2021-long segment wall thickening of the esophagus, prominent proximally, multiple large supraclavicular mediastinal nodes, bilateral lung nodules,, and upper abdominal nodes similar to remote abdominal CT Biopsy 08/07/2021 showed invasive moderately differentiated squamous cell carcinoma Foundation 1-MSS, tumor mutation burden 7, PD-L1 CPS 10 Esophagus radiation 08/13/2021 - 09/03/2021 Cycle 1 FOLFOX/nivolumab 09/08/2021 Cycle 2 held 09/22/2021 due to neutropenia Cycle 2 FOLFOX/nivolumab 09/29/2021, [facility] Cycle 3 FOLFOX/nivolumab 10/13/2021, [facility] Cycle 4 FOLFOX/nivolumab 10/27/2021, [facility] Cycle 5 FOLFOX/nivolumab 11/10/2021, [facility] CTs 11/17/2021-unchanged long segment ill-defined circumferential wall thickening of the esophagus; multiple bilateral cavitary pulmonary nodules are slightly diminished in size; increase in size of a nodule in the perihilar posterior left upper lobe with associated postobstructive airspace disease; unchanged enlarged high right paratracheal and low right paraesophageal lymph node; interval sclerosis of multiple osseous  metastatic lesions Cycle 6 FOLFOX/nivolumab 11/24/2021, [facility] Cycle 7 FOLFOX/nivolumab 12/08/2021, [facility] Cycle 8 FOLFOX/nivolumab 12/22/2021, [facility]den[facility] CT  chest 01/07/2022-negative for pulmonary embolism, dense consolidation in the right lower lobe with airspace disease in the left upper lobe consistent with pneumonia, mild progression of mediastinal and right hilar adenopathy, stable lung nodules with enlargement of a right upper lobe nodule, suspected left supraclavicular lymph node   Solid/liquid dysphagia secondary to #1 -Gastrostomy tube placement 07/31/2021 Weight loss secondary to #1 Odynophagia secondary to #1 History of colon polyps-tubular adenomas History of a diverticular bleed-April 2020 Anemia Thrombocytosis Left subclavian Port-A-Cath placement 08/10/2021 Left upper extremity edema -Doppler ultrasound of left upper extremity 08/13/2021-negative for DVT 11.  Oxaliplatin neuropathy-mild/moderate loss of vibratory sense 12/08/2021, 12/22/2021 12.  Admission 01/07/2022 with dyspnea-CT chest consistent with pneumonia  Leslie Livingston has metastatic esophagus cancer.  He is currently being treated with FOLFOX/nivolumab last given on 12/14/2021.  He was admitted 01/07/2022 with pneumonia.  He feels better.  He is completing a course of oral antibiotics. His clinical status appears improved.  The CT chest on hospital admission was negative for pulmonary embolism.  He has persistent evidence of metastatic esophagus cancer with multiple lung nodules, mediastinal lymph nodes, and bone metastases.  The overall pattern of disease is stable.  I reviewed the CT findings and images.  Outpatient follow-up to be scheduled at the cancer center to discuss the indication for continuing chemotherapy versus hospice care.  He should continue tube feedings and diet as recommended by speech therapy.    LOS: 4 days   Betsy Coder, MD   01/11/2022, 1:29 PM

## 2022-01-11 NOTE — NC FL2 (Signed)
[place] LEVEL OF CARE FORM     IDENTIFICATION  Patient Name: Leslie Livingston Birthdate: 11/18/1950 Sex: male Admission Date (Current Location): 01/07/2022  [facility] and [place] Number:  Herbalist and Address:  [facility] [address],  [place], [place]      Provider Number: [id number]  Attending Physician Name and Address:  Lavina Hamman, MD  Relative Name and Phone Number:  [name] (daughter) Ph: [phone]    Current Level of Care: Hospital Recommended Level of Care: [place] Prior Approval Number:    Date Approved/Denied:   PASRR Number: [id number]  Discharge Plan: SNF    Current Diagnoses: Patient Active Problem List   Diagnosis Date Noted   Generalized weakness 01/11/2022   Sepsis ([place]) 01/07/2022   Malnutrition of moderate degree 08/11/2021   Cancer of proximal third of esophagus ([place]) 08/11/2021   Dysphagia 08/11/2021   Squamous cell carcinoma, esophagus ([place]) 08/11/2021   Esophageal mass 08/07/2021   Iron deficiency anemia due to chronic blood loss 05/05/2018   Dyslipidemia 05/05/2018   Obesity (BMI 30-39.9) 05/05/2018   GERD (gastroesophageal reflux disease) 05/05/2018   Acute blood loss anemia 05/05/2018   Diverticulosis of colon with hemorrhage    Rectal bleed 05/04/2018   GI bleed 05/02/2018   ERECTILE DYSFUNCTION 06/06/2007   ANEMIA-NOS 12/16/2006   Essential hypertension 12/16/2006   HEMORRHOIDS, EXTERNAL 12/16/2006    Orientation RESPIRATION BLADDER Height & Weight     Self, Time, Situation, Place  Normal Incontinent Weight: 190 lb 11.2 oz (86.5 kg) Height:  '6\' 1"'$  (185.4 cm)  BEHAVIORAL SYMPTOMS/MOOD NEUROLOGICAL BOWEL NUTRITION STATUS   (N/A)  (N/A) Continent Diet, Feeding tube (NPO for 1 week with tube feeds, but initiate dys 2 diet after one week.)  AMBULATORY STATUS COMMUNICATION OF NEEDS Skin   Limited Assist Verbally Normal                       Personal  Care Assistance Level of Assistance  Bathing, Feeding, Dressing Bathing Assistance: Limited assistance Feeding assistance: Independent Dressing Assistance: Limited assistance     Functional Limitations Info  Sight, Hearing, Speech Sight Info: Adequate Hearing Info: Adequate Speech Info: Adequate    SPECIAL CARE FACTORS FREQUENCY  PT (By licensed PT), OT (By licensed OT)     PT Frequency: 5x's/week OT Frequency: 5x's/week            Contractures Contractures Info: Not present    Additional Factors Info  Code Status, Allergies Code Status Info: Full Allergies Info: NKA           Current Medications (01/11/2022):  This is the current hospital active medication list Current Facility-Administered Medications  Medication Dose Route Frequency Provider Last Rate Last Admin   acetaminophen (TYLENOL) tablet 650 mg  650 mg Oral Q6H PRN Irene Pap N, DO   650 mg at 01/08/22 2201   amoxicillin-clavulanate (AUGMENTIN) 400-57 MG/5ML suspension 800 mg  800 mg Per Tube Q12H Lavina Hamman, MD   800 mg at 01/10/22 2219   Chlorhexidine Gluconate Cloth 2 % PADS 6 each  6 each Topical Daily Lavina Hamman, MD   6 each at 01/10/22 1032   enoxaparin (LOVENOX) injection 40 mg  40 mg Subcutaneous QHS Irene Pap N, DO   40 mg at 01/10/22 2219   famotidine (PEPCID) 40 MG/5ML suspension 20 mg  20 mg Per Tube QHS Lavina Hamman, MD   20  mg at 01/10/22 2219   feeding supplement (OSMOLITE 1.5 CAL) liquid 1,000 mL  1,000 mL Per Tube Q24H Lavina Hamman, MD   1,000 mL at 01/10/22 1753   feeding supplement (PROSource TF20) liquid 60 mL  60 mL Per Tube Daily Lavina Hamman, MD   60 mL at 01/10/22 1021   free water 150 mL  150 mL Per Tube Q4H Lavina Hamman, MD   150 mL at 01/11/22 0412   guaiFENesin (ROBITUSSIN) 100 MG/5ML liquid 5 mL  5 mL Per Tube Q4H PRN Lavina Hamman, MD       ipratropium-albuterol (DUONEB) 0.5-2.5 (3) MG/3ML nebulizer solution 3 mL  3 mL Nebulization TID Irene Pap N,  DO   3 mL at 01/11/22 4462   lactobacillus (FLORANEX/LACTINEX) granules 1 g  1 g Per Tube TID Lavina Hamman, MD   1 g at 01/10/22 2219   lip balm (CARMEX) ointment   Topical PRN Lavina Hamman, MD   75 Application at 86/38/17 1432   melatonin tablet 3 mg  3 mg Per Tube QHS PRN Lavina Hamman, MD       ondansetron [facility]) injection 4 mg  4 mg Intravenous Q6H PRN Lavina Hamman, MD       Oral care mouth rinse  15 mL Mouth Rinse 4 times per day Lavina Hamman, MD       Oral care mouth rinse  15 mL Mouth Rinse PRN Lavina Hamman, MD       prochlorperazine (COMPAZINE) injection 5 mg  5 mg Intravenous Q6H PRN Lavina Hamman, MD       sodium chloride flush (NS) 0.9 % injection 10-40 mL  10-40 mL Intracatheter Q12H Lavina Hamman, MD   10 mL at 01/09/22 7116   sodium chloride flush (NS) 0.9 % injection 10-40 mL  10-40 mL Intracatheter PRN Lavina Hamman, MD         Discharge Medications: Please see discharge summary for a list of discharge medications.  Relevant Imaging Results:  Relevant Lab Results:   Additional Information SSN: [id number]  Sherie Don, LCSW

## 2022-01-12 DIAGNOSIS — A419 Sepsis, unspecified organism: Secondary | ICD-10-CM | POA: Diagnosis not present

## 2022-01-12 LAB — CULTURE, BLOOD (ROUTINE X 2)
Culture: NO GROWTH
Culture: NO GROWTH
Special Requests: ADEQUATE
Special Requests: ADEQUATE

## 2022-01-12 LAB — GLUCOSE, CAPILLARY
Glucose-Capillary: 100 mg/dL — ABNORMAL HIGH (ref 70–99)
Glucose-Capillary: 116 mg/dL — ABNORMAL HIGH (ref 70–99)
Glucose-Capillary: 118 mg/dL — ABNORMAL HIGH (ref 70–99)
Glucose-Capillary: 125 mg/dL — ABNORMAL HIGH (ref 70–99)
Glucose-Capillary: 134 mg/dL — ABNORMAL HIGH (ref 70–99)
Glucose-Capillary: 135 mg/dL — ABNORMAL HIGH (ref 70–99)
Glucose-Capillary: 140 mg/dL — ABNORMAL HIGH (ref 70–99)

## 2022-01-12 MED ORDER — IPRATROPIUM-ALBUTEROL 0.5-2.5 (3) MG/3ML IN SOLN: 3.0000 mL | Freq: Four times a day (QID) | RESPIRATORY_TRACT | Status: DC | PRN

## 2022-01-12 NOTE — Progress Notes (Signed)
Occupational Therapy Treatment Patient Details Name: Leslie Livingston MRN: [id number] DOB: 12/08/1950 Today's Date: 01/12/2022   History of present illness Pt is a 71 y/o M admitted on 01/07/22 after presenting with c/o generalized weakness, persistent cough & progressive dyspnea with minimal exertion. Chest CT revealed RLL infiltrates suggestive of PNA, negative for PE. PMH: metastatic esophageal CA, failure to thrive   OT comments  The patient was motivated to participate in the session. He required min guard to min assist with tasks, including supine to sit, simulated lower body dressing, sit to stand, and ambulating a short distance in his room using a RW. He was noted to be with intermittent unsteadiness in standing & he required occasional verbal cues for general safety awareness during progressive activity. He was assisted to the bedside chair to promote upright sitting & progressive out of bed tolerance. OT will continue to follow the pt for further services to maximize his safety and independence with self-care tasks. It is anticipated that he will need short-term SNF rehab, if he does not have available assistance & supervision in the home once discharged from the hospital.    Recommendations for follow up therapy are one component of a multi-disciplinary discharge planning process, led by the attending physician.  Recommendations may be updated based on patient status, additional functional criteria and insurance authorization.    Follow Up Recommendations  Skilled nursing-short term rehab (<3 hours/day)     Assistance Recommended at Discharge Frequent or constant Supervision/Assistance  Patient can return home with the following  A little help with walking and/or transfers;A little help with bathing/dressing/bathroom   Equipment Recommendations  BSC/3in1       Precautions / Restrictions Precautions Precautions: Fall Precaution Comments: feeding tube Restrictions Weight  Bearing Restrictions: No       Mobility Bed Mobility Overal bed mobility: Needs Assistance Bed Mobility: Supine to Sit     Supine to sit: HOB elevated, Min guard          Transfers Overall transfer level: Needs assistance Equipment used: Rolling walker (2 wheels) Transfers: Sit to/from Stand Sit to Stand: Min assist                     ADL either performed or assessed with clinical judgement   ADL Overall ADL's : Needs assistance/impaired   Eating/Feeding Details (indicate cue type and reason): Current feeding tube Grooming: Set up;Min guard Grooming Details (indicate cue type and reason): Set-up sitting or min guard standing         Upper Body Dressing : Minimal assistance   Lower Body Dressing: Minimal assistance                        Cognition Arousal/Alertness: Awake/alert Behavior During Therapy: Flat affect   Area of Impairment: Safety/judgement      Following Commands:  (Follows 1-2 step commands with slightly increased time) Safety/Judgement: Decreased awareness of safety, Decreased awareness of deficits                         Pertinent Vitals/ Pain       Pain Assessment Pain Assessment: No/denies pain         Frequency  Min 2X/week        Progress Toward Goals  OT Goals(current goals can now be found in the care plan section)  Progress towards OT goals: Progressing toward goals  Acute Rehab OT Goals Patient  Stated Goal: he did not specifically state OT Goal Formulation: With patient Time For Goal Achievement: 01/22/22 Potential to Achieve Goals: Good  Plan Discharge plan remains appropriate       AM-PAC OT "6 Clicks" Daily Activity     Outcome Measure   Help from another person eating meals?: Total Help from another person taking care of personal grooming?: A Little Help from another person toileting, which includes using toliet, bedpan, or urinal?: A Little Help from another person bathing  (including washing, rinsing, drying)?: A Lot Help from another person to put on and taking off regular upper body clothing?: A Little Help from another person to put on and taking off regular lower body clothing?: A Little 6 Click Score: 15    End of Session Equipment Utilized During Treatment: Rolling walker (2 wheels)  OT Visit Diagnosis: Unsteadiness on feet (R26.81);Muscle weakness (generalized) (M62.81)   Activity Tolerance Patient tolerated treatment well   Patient Left in chair;with call bell/phone within reach;with family/visitor present   Nurse Communication Mobility status        Time: 1055-1110 OT Time Calculation (min): 15 min  Charges: OT General Charges $OT Visit: 1 Visit OT Treatments $Therapeutic Activity: 8-22 mins    Leota Sauers, OTR/L 01/12/2022, 11:28 AM

## 2022-01-12 NOTE — Progress Notes (Signed)
[facility] Progress Note Patient: Leslie Livingston VDI:[id number] DOB: 01-22-1951 DOA: 01/07/2022  DOS: the patient was seen and examined on 01/12/2022  Brief hospital course: PMH of metastatic esophageal cancer on tube feedings via PEG tube, failure to thrive, HTN, HLD present to the hospital complains of fatigue, confusion and shortness of breath.  Currently being treated for pneumonia. Assessment and Plan: Sepsis secondary to right lower lobe pneumonia, seen on CT scan, POA Met SIRS criteria on admission. Currently sepsis physiology has resolved. Initially was on IV antibiotics.  Currently on oral antibiotics.  Via tube. Tolerating tube feeds.  Will advance diet and monitor.   Dysphagia. Speech therapy following. MBS was completed on 12/16.  Patient is able to tolerate oral diet.  Speech therapy recommends D3 diet although patient does not eat or drink anything by mouth at home per daughter and per patient. Patient was kept n.p.o. as there was concern that the patient will not be able to tolerate tube feeds at goal. Currently tube feeds at goal and tolerating.  Will advance to dysphagia 2 diet.   Metastatic esophageal cancer Overall similar appearance of multifocal pulmonary metastasis with interval enlargement of at least 1 nodule in the right upper lobe.  Similar appearance of osseous metastatic disease. Follows with medical oncology Dr. Learta Codding Outpatient follow-up recommended.   Generalized weakness with falls Encephalopathy including staring spells. Treat underlying conditions Fall precautions MRI brain with and without contrast was performed due to patient's history of metastatic esophageal cancer and daughters report of patient's encephalopathy events at home. Negative for any acute abnormality.  Negative for metastasis.  PT OT assessment recommends home health if the patient has 24/7 assistance. Patient's daughter runs a restaurant and is unable to provide 24/7  assistance.  Patient currently agreeable to consider SNF option.  Will initiate.   Subjective: no acute complaint. No nausea no vomiting or no fever no chills.  Physical Exam: Clear to auscultation. S1-S2 present Bowel sound present. No edema. No asterixis.  Able to follow all commands.  Data Reviewed: I have Reviewed nursing notes, Vitals, and Lab results.  Disposition: Status is: Inpatient Remains inpatient appropriate because: Need for safe discharge plan.  Does not have 24/7 assistance at home.  Remains medically stable. enoxaparin (LOVENOX) injection 40 mg Start: 01/07/22 2315   Family Communication: Brother at bedside.  Discussed with daughter on the phone on 12/18. Level of care: Med-Surg continue telemetry for frequent PVCs. Vitals:   01/12/22 0849 01/12/22 1020 01/12/22 1335 01/12/22 2016  BP:  (!) 101/59 112/63 104/66  Pulse:  86 90 81  Resp:   18 17  Temp:   98 F (36.7 C) 99.4 F (37.4 C)  TempSrc:   Oral Oral  SpO2: 92% 97% 98% 97%  Weight:      Height:         Author: Berle Mull, MD 01/12/2022 9:02 PM  Please look on [email or web address] to find out who is on call.

## 2022-01-12 NOTE — TOC Progression Note (Addendum)
Transition of Care [facility]) - Progression Note    Patient Details  Name: TAGG EUSTICE MRN: [id number] Date of Birth: 1950-06-13  Transition of [facility]) CM/SW Contact  Cully Luckow, Juliann Pulse, RN Phone Number: 01/12/2022, 2:36 PM  Clinical Narrative:patient/dtr Darrel Hoover to [facility] as choice;auth initiated  [id number] auth.       Expected Discharge Plan: [place] Barriers to Discharge: Insurance Authorization  Expected Discharge Plan and Services Expected Discharge Plan: [place] In-house Referral: Clinical Social Work Discharge Planning Services: CM Consult Post Acute Care Choice: [place] Living arrangements for the past 2 months: [place] Expected Discharge Date: 01/11/22               DME Arranged: N/A DME Agency: NA                   Social Determinants of Health (SDOH) Interventions    Readmission Risk Interventions    01/08/2022   12:32 PM  Readmission Risk Prevention Plan  Transportation Screening Complete  PCP or Specialist Appt within 3-5 Days Complete  HRI or [place] Complete  Social Work Consult for [place] Planning/Counseling Complete  Palliative Care Screening Complete  Medication Review [employer] photographer) Complete

## 2022-01-13 ENCOUNTER — Inpatient Hospital Stay (HOSPITAL_COMMUNITY): Payer: Medicare PPO

## 2022-01-13 DIAGNOSIS — C159 Malignant neoplasm of esophagus, unspecified: Secondary | ICD-10-CM

## 2022-01-13 LAB — GLUCOSE, CAPILLARY
Glucose-Capillary: 132 mg/dL — ABNORMAL HIGH (ref 70–99)
Glucose-Capillary: 123 mg/dL — ABNORMAL HIGH (ref 70–99)
Glucose-Capillary: 118 mg/dL — ABNORMAL HIGH (ref 70–99)
Glucose-Capillary: 135 mg/dL — ABNORMAL HIGH (ref 70–99)

## 2022-01-13 MED ORDER — NYSTATIN 100000 UNIT/ML MT SUSP
5.0000 mL | Freq: Four times a day (QID) | OROMUCOSAL | Status: DC
  Administered 2022-01-13 (×2): 500000 [IU] via ORAL
  Filled 2022-01-13: qty 5

## 2022-01-13 MED ORDER — HEPARIN SOD (PORK) LOCK FLUSH 100 UNIT/ML IV SOLN
500.0000 [IU] | INTRAVENOUS | Status: AC | PRN
  Administered 2022-01-13: 500 [IU]
  Filled 2022-01-13: qty 5

## 2022-01-13 NOTE — TOC Transition Note (Signed)
Transition of Care [facility]) - CM/SW Discharge Note   Patient Details  Name: Leslie Livingston MRN: [id number] Date of Birth: Jan 19, 1951  Transition of [facility]) CM/SW Contact:  Dessa Phi, RN Phone Number: 01/13/2022, 12:52 PM   Clinical Narrative: d/c [place] awaiting rn#,report tel# prior PTAR.      Final next level of care: [place] Barriers to Discharge: No Barriers Identified   Patient Goals and CMS Choice Patient states their goals for this hospitalization and ongoing recovery are:: Go to SNF (Home) CMS Medicare.gov Compare Post Acute Care list provided to:: Patient Represenative (must comment) ([place](dtr)) Choice offered to / list presented to : Adult Children    Discharge Placement                       Discharge Plan and Services In-house Referral: Clinical Social Work Discharge Planning Services: CM Consult Post Acute Care Choice: [place]          DME Arranged: N/A DME Agency: NA                  Social Determinants of Health (SDOH) Interventions     Readmission Risk Interventions    01/08/2022   12:32 PM  Readmission Risk Prevention Plan  Transportation Screening Complete  PCP or Specialist Appt within 3-5 Days Complete  HRI or [place] Complete  Social Work Consult for [place] Planning/Counseling Complete  Palliative Care Screening Complete  Medication Review Press photographer) Complete

## 2022-01-13 NOTE — Progress Notes (Signed)
Physical Therapy Treatment Patient Details Name: Leslie Livingston MRN: [id number] DOB: 1950/06/24 Today's Date: 01/13/2022   History of Present Illness Pt is a 71 y/o M admitted on 01/07/22 after presenting with c/o generalized weakness, persistent cough & progressive dyspnea with minimal exertion. Chest CT revealed RLL infiltrates suggestive of PNA, negative for PE. PMH: metastatic esophageal CA, failure to thrive    PT Comments    Pt reports fatigue from earlier walk but he was agreeable to work with therapy. Discussed baseline mobility-pt reports he used quad cane PRN prior to this admission. Practiced gait training with quad cane for short distance-Min A to steady throughout distance. Pt continued to report fatigue. After seated rest break, he was able to perform 5 reps of sit to stand exercise. Assisted pt back to bed at his request. Continue to recommend ST [facility] rehab.     Recommendations for follow up therapy are one component of a multi-disciplinary discharge planning process, led by the attending physician.  Recommendations may be updated based on patient status, additional functional criteria and insurance authorization.  Follow Up Recommendations  Skilled nursing-short term rehab (<3 hours/day) Can patient physically be transported by private vehicle: Yes   Assistance Recommended at Discharge Frequent or constant Supervision/Assistance  Patient can return home with the following A little help with walking and/or transfers;A little help with bathing/dressing/bathroom;Assistance with cooking/housework;Assist for transportation;Help with stairs or ramp for entrance   Equipment Recommendations  Rolling walker (2 wheels)    Recommendations for Other Services       Precautions / Restrictions Precautions Precautions: Fall Precaution Comments: feeding tube Restrictions Weight Bearing Restrictions: No     Mobility  Bed Mobility Overal bed mobility: Needs Assistance Bed  Mobility: Supine to Sit, Sit to Supine     Supine to sit: Supervision, HOB elevated Sit to supine: Supervision   General bed mobility comments: Supv for safety, line management.    Transfers Overall transfer level: Needs assistance Equipment used: Quad cane Transfers: Sit to/from Stand Sit to Stand: Min assist           General transfer comment: Uses quad cane at baseline, so practiced with it on today. Min A to rise, steady, control descent. Cues for safety, hand placement    Ambulation/Gait Ambulation/Gait assistance: Min assist Gait Distance (Feet): 15 Feet (only in room this session) Assistive device: Quad cane Gait Pattern/deviations: Step-through pattern, Decreased stride length       General Gait Details: Pt reportsf fatigue from earlier walk in hallway. Uses quad cane at baseline so practiced with it on today: Min A to steady throughout distance. Cues for safety. Less steady with quad cane and fatigued per pt report   Stairs             Wheelchair Mobility    Modified Rankin (Stroke Patients Only)       Balance Overall balance assessment: Needs assistance         Standing balance support: Bilateral upper extremity supported, During functional activity, Single extremity supported Standing balance-Leahy Scale: Poor                              Cognition Arousal/Alertness: Awake/alert Behavior During Therapy: Flat affect Overall Cognitive Status: No family/caregiver present to determine baseline cognitive functioning Area of Impairment: Safety/judgement                     Memory: Decreased short-term memory  Following Commands: Follows one step commands with increased time     Problem Solving: Slow processing, Decreased initiation, Requires verbal cues, Requires tactile cues General Comments: Pt with delayed responses to all questions.        Exercises Other Exercises Other Exercises: 5 reps, sit to stand  exercise    General Comments        Pertinent Vitals/Pain Pain Assessment Pain Assessment: No/denies pain    Home Living                          Prior Function            PT Goals (current goals can now be found in the care plan section) Progress towards PT goals: Progressing toward goals    Frequency    Min 3X/week      PT Plan Current plan remains appropriate    Co-evaluation              AM-PAC PT "6 Clicks" Mobility   Outcome Measure  Help needed turning from your back to your side while in a flat bed without using bedrails?: A Little Help needed moving from lying on your back to sitting on the side of a flat bed without using bedrails?: A Little Help needed moving to and from a bed to a chair (including a wheelchair)?: A Little Help needed standing up from a chair using your arms (e.g., wheelchair or bedside chair)?: A Little Help needed to walk in hospital room?: A Little Help needed climbing 3-5 steps with a railing? : A Lot 6 Click Score: 17    End of Session   Activity Tolerance: Patient tolerated treatment well;Patient limited by fatigue Patient left: in bed;with call bell/phone within reach;with bed alarm set   PT Visit Diagnosis: Muscle weakness (generalized) (M62.81);Unsteadiness on feet (R26.81)     Time: 8891-6945 PT Time Calculation (min) (ACUTE ONLY): 15 min  Charges:  $Gait Training: 8-22 mins                        Doreatha Massed, PT Acute Rehabilitation  Office: [phone]

## 2022-01-13 NOTE — Progress Notes (Signed)
Report called to [facility]. D/c summary faxed.  Patients port de-accessed by IV team. Family updated. PTAR called. Pt in stable condition ready for transport.

## 2022-01-13 NOTE — Progress Notes (Addendum)
No chart note: Patient is medically stable to discharge, detail please see d/c summary done by Dr Posey Pronto on 12/18. He has been waiting for insurance auth, no interval changes, case discussed with oncology Dr Benay Spice who is ok with him eating by mouth, he is getting eg esophagram prior to discharge to snf today, he should continue tube feeds event if he is ok to eat by mouth, recommend speech to continue to follow him while at snf, he is to follow with with oncology Dr Benay Spice in the next 1-2 weeks.  Called daughter and left message for her to call back.   Addendum: daughter updated over the phone, she is in agreement with current plan.   Addendum: Dg esophagus showed ". Significant circumferential stenosis of the distal esophagus over a 1.7 cm segment. Differential includes benign and malignant stricturing of the distal esophagus at the [place]."    Recommend: small amount of Pureed diet/thin liquid for pleasure if able to tolerate, main nutrition /meds per tube.

## 2022-01-13 NOTE — TOC Transition Note (Addendum)
Transition of Care [facility]) - CM/SW Discharge Note   Patient Details  Name: Leslie Livingston MRN: [id number] Date of Birth: [birth year]  Transition of Care [facility] CM/SW Contact:  Dessa Phi, RN Phone Number: 01/13/2022, 1:00 PM   Clinical Narrative:   d/c today see prior note. Still awaiting d/c summary.Will await post diagnostic procedure prior PTAR. Nsg aware to inform me. -3:22p-patient can come to [facility] up till 9p.Awaiting d/c summary.Nsg aware.PTAR forms @ nsg station.Going to rm# [id number],report# [phone].After 4p Nsg please fax d/c summary to [phone]-give report after d/c summary has been sent.I can call PTAR up until 5p after 5p nsg call PTAR.thnx.     Final next level of care: Person Barriers to Discharge: No Barriers Identified   Patient Goals and CMS Choice Patient states their goals for this hospitalization and ongoing recovery are:: Go to SNF (Home) CMS Medicare.gov Compare Post Acute Care list provided to:: Patient Represenative (must comment) (Latonya(dtr)) Choice offered to / list presented to : Adult Children    Discharge Placement                Patient to be transferred to facility by:  [name]) Name of family member notified:  (LaTonya(dtr)) Patient and family notified of of transfer: 01/13/22  Discharge Plan and Services In-house Referral: Clinical Social Work Discharge Planning Services: CM Consult Post Acute Care Choice: [place]          DME Arranged: N/A DME Agency: NA                  Social Determinants of Health (SDOH) Interventions     Readmission Risk Interventions    01/08/2022   12:32 PM  Readmission Risk Prevention Plan  Transportation Screening Complete  PCP or Specialist Appt within 3-5 Days Complete  HRI or [place] Complete  Social Work Consult for [place] Planning/Counseling Complete  Palliative Care Screening Complete  Medication Review Press photographer) Complete

## 2022-01-14 ENCOUNTER — Telehealth: Payer: Self-pay | Admitting: Gastroenterology

## 2022-01-14 ENCOUNTER — Other Ambulatory Visit: Payer: Self-pay | Admitting: *Deleted

## 2022-01-14 DIAGNOSIS — C159 Malignant neoplasm of esophagus, unspecified: Secondary | ICD-10-CM

## 2022-01-14 LAB — VITAMIN B1: Vitamin B1 (Thiamine): 110.2 nmol/L (ref 66.5–200.0)

## 2022-01-14 NOTE — Telephone Encounter (Signed)
Spoke with patient's daughter & scheduled pt for OV with Nevin Bloodgood, NP on 02/02/22 at 1:30 pm. She was offered sooner dates, but stated she could only do Tuesdays. She's been advised on location & when/where to go for appointment. Reminder letter mailed home.

## 2022-01-14 NOTE — Telephone Encounter (Signed)
Urgent referral in [place] for Esophageal cancer, recently hospitalized and Dr Benay Spice wants him evaluated for stricture vs progression of cancer.  Patient has seen Dr. Ardis Hughs and Dr. Fuller Plan in the past.  Please advise scheduling.  Thanks

## 2022-01-14 NOTE — Telephone Encounter (Signed)
Please schedule office appt with next available APP

## 2022-01-15 ENCOUNTER — Other Ambulatory Visit: Payer: Self-pay

## 2022-01-18 ENCOUNTER — Other Ambulatory Visit: Payer: Self-pay | Admitting: Oncology

## 2022-01-19 ENCOUNTER — Inpatient Hospital Stay: Payer: Medicare PPO

## 2022-01-19 ENCOUNTER — Telehealth: Payer: Self-pay

## 2022-01-19 ENCOUNTER — Inpatient Hospital Stay: Payer: Medicare PPO | Admitting: Oncology

## 2022-01-19 NOTE — Telephone Encounter (Signed)
This Probation officer received a call from patient's daughter at [phone] that patient will not be coming for his Lab/Flush/MD/Treatment appointments today because he tested positive for COVID 2 days ago at [facility] where he is currently admitted.

## 2022-01-20 ENCOUNTER — Inpatient Hospital Stay: Payer: Medicare PPO

## 2022-01-21 ENCOUNTER — Ambulatory Visit: Payer: Medicare PPO | Admitting: Physician Assistant

## 2022-01-30 ENCOUNTER — Other Ambulatory Visit: Payer: Self-pay | Admitting: Oncology

## 2022-02-02 ENCOUNTER — Ambulatory Visit: Payer: Medicare PPO | Admitting: Nurse Practitioner

## 2022-02-02 NOTE — Progress Notes (Deleted)
Assessment    Patient profile:  Leslie Livingston is a 72 y.o. male known to Dr. Ardis Hughs with a past medical history of metastatic esophageal cancer. See PMH /PSH for additional history    Plan       HPI    Chief complaint:    Leslie Livingston has metastatic esophageal cancer. He completed another cycle of FOLFOX/nivolumab on 12/22/2021. He has a gastrostomy tube placed in July. He saw Oncology 12/14 for follow up and dyspnea, cough, and failure to thrive over the past 2 weeks. He had been falling. Plan was for chest CT scan but insurance denied so sent to ED and subsequently admitted. Admitted with sepsis and right lower lobe pulmonary infiltrates suggestive of pneumonia. Evaluated by SLP. Multiple swallows across all boluses noted concerning for pharyngeal or esophageal retention.However, MBS 01/09/22 showed functional oropharyngeal swallowing without aspiration or penetration of any consistency tested. A D3 diet was recommended. He then had an barium swallow which was abnormal.   Esophagram 01/13/22 showed significant circumferential stenosis of the distal esophagus over a 1.7 cm segment.   Previous GI Evaluation   July 2023 EGD - Standard adult gastroscope could not pass into his very tortuous and narrowed UES, evenafter elective airway intubation for gagging and airway spasm and so I used a pediatric gastroscope to complete the exam. - Tight, 10cm long, circumferential proximal esophageal mass that extends from the UES to about 30cm from the incisors. The mass was friable, ulcerated and is most certainly a malignancy. I took several biopsies with pediatric forceps. - The esophagus was otherwise normal to the GE junction.  FINAL MICROSCOPIC DIAGNOSIS:  A. ESOPHAGUS, PROXIMAL, BIOPSY: - Invasive moderately differentiated squamous cell carcinoma, see comment    Labs:     Latest Ref Rng & Units 01/11/2022    2:45 AM 01/10/2022    2:35 AM 01/09/2022    4:14 AM  CBC  WBC 4.0 -  10.5 K/uL 10.0  10.4  10.3   Hemoglobin 13.0 - 17.0 g/dL 9.1  8.6  9.1   Hematocrit 39.0 - 52.0 % 30.0  28.6  29.6   Platelets 150 - 400 K/uL 264  248  250        Latest Ref Rng & Units 01/08/2022    5:11 AM 01/07/2022   11:36 AM 01/07/2022    8:27 AM  Hepatic Function  Total Protein 6.5 - 8.1 g/dL 7.0  7.4  7.5   Albumin 3.5 - 5.0 g/dL 2.3  3.0  3.0   AST 15 - 41 U/L '18  13  12   '$ ALT 0 - 44 U/L '12  9  9   '$ Alk Phosphatase 38 - 126 U/L 86  93  96   Total Bilirubin 0.3 - 1.2 mg/dL 0.7  0.9  0.8      Past Medical History:  Diagnosis Date   Allergy    Arthritis    Hyperlipidemia    Hypertension     Past Surgical History:  Procedure Laterality Date   BIOPSY  08/07/2021   Procedure: BIOPSY;  Surgeon: Milus Banister, MD;  Location: [facility];  Service: Gastroenterology;;   COLONOSCOPY WITH PROPOFOL N/A 05/05/2018   Procedure: COLONOSCOPY WITH PROPOFOL;  Surgeon: Irene Shipper, MD;  Location: [facility];  Service: Endoscopy;  Laterality: N/A;   ESOPHAGOGASTRODUODENOSCOPY (EGD) WITH PROPOFOL N/A 08/07/2021   Procedure: ESOPHAGOGASTRODUODENOSCOPY (EGD) WITH PROPOFOL;  Surgeon: Milus Banister, MD;  Location: [facility] END[facility];  Service: Gastroenterology;  Laterality: N/A;   GASTROSTOMY N/A 08/10/2021   Procedure: INSERTION OF GASTROSTOMY TUBE;  Surgeon: Coralie Keens, MD;  Location: [facility]S;  Service: General;  Laterality: N/A;   IR [place] GASTRO/COLONIC TUBE PERCUT W/FLUORO  09/16/2021   PORTACATH PLACEMENT N/A 08/10/2021   Procedure: INSERTION PORT-A-CATH;  Surgeon: Coralie Keens, MD;  Location: [facility];  Service: General;  Laterality: N/A;    Current Medications, Allergies, Family History and Social History were reviewed in [place] [facility] record.     Current Outpatient Medications  Medication Sig Dispense Refill   famotidine (PEPCID) 40 MG/5ML suspension Place 2.5 mLs (20 mg total) into feeding tube at bedtime. 50 mL 0   guaiFENesin (ROBITUSSIN) 100  MG/5ML liquid Place 5 mLs into feeding tube every 4 (four) hours as needed for cough or to loosen phlegm. 120 mL 0   ipratropium-albuterol (DUONEB) 0.5-2.5 (3) MG/3ML SOLN Take 3 mLs by nebulization 3 (three) times daily. 360 mL 0   lactobacillus (FLORANEX/LACTINEX) PACK Place 1 packet (1 g total) into feeding tube 3 (three) times daily. 30 each 0   lidocaine-prilocaine (EMLA) cream Apply 1 Application topically as needed. 30 g 0   melatonin 3 MG TABS tablet Place 1 tablet (3 mg total) into feeding tube at bedtime as needed. 30 tablet 0   Nutritional Supplements (FEEDING SUPPLEMENT, OSMOLITE 1.5 CAL,) LIQD Place 1,000 mLs into feeding tube continuous. Rate is 55 cc/hr. 1000 mL 0   promethazine (PHENERGAN) 6.25 MG/5ML syrup Take 12.5 mg (10 milliliters) via G-Tube every 6 hours as needed for nausea and vomiting. 120 mL 2   Protein (FEEDING SUPPLEMENT, PROSOURCE TF20,) liquid Place 60 mLs into feeding tube daily. 1500 mL 0   Water For Irrigation, Sterile (FREE WATER) SOLN Place 150 mLs into feeding tube every 4 (four) hours. Rate is 55 cc/ hr. 150 mL 0   No current facility-administered medications for this visit.    Review of Systems: No chest pain. No shortness of breath. No urinary complaints.    Physical Exam  Wt Readings from Last 3 Encounters:  01/13/22 188 lb 7.9 oz (85.5 kg)  01/07/22 200 lb 12.8 oz (91.1 kg)  12/22/21 205 lb 6.4 oz (93.2 kg)    There were no vitals taken for this visit. Constitutional:  Generally well appearing ***male in no acute distress. Psychiatric: Pleasant. Normal mood and affect. Behavior is normal. EENT: Pupils normal.  Conjunctivae are normal. No scleral icterus. Neck supple.  Cardiovascular: Normal rate, regular rhythm.  Pulmonary/chest: Effort normal and breath sounds normal. No wheezing, rales or rhonchi. Abdominal: Soft, nondistended, nontender. Bowel sounds active throughout. There are no masses palpable. No hepatomegaly. Neurological: Alert and  oriented to person place and time. Musculoskeletal: No edema Skin: Skin is warm and dry. No rashes noted.  Tye Savoy, NP  02/02/2022, 11:02 AM  Cc:  Lorene Dy, MD

## 2022-02-03 ENCOUNTER — Telehealth: Payer: Self-pay

## 2022-02-03 NOTE — Telephone Encounter (Signed)
Spoke with patient's daughter & she stated patient missed his appointment yesterday d/t weather concern. Rescheduled OV for 02/09/22 at 1:30 pm with Anderson Malta, PA.

## 2022-02-04 ENCOUNTER — Telehealth: Payer: Self-pay | Admitting: *Deleted

## 2022-02-04 ENCOUNTER — Other Ambulatory Visit: Payer: Self-pay

## 2022-02-04 NOTE — Telephone Encounter (Signed)
Left VM for daughter to call and let office know if [name] has recovered enough from his COVID infection to see Dr. Benay Spice? We can look for a Tuesday morning to fit her schedule. [place] and per Leslie Livingston., LPN they do not allow chemo infusion pump therapy there.

## 2022-02-08 ENCOUNTER — Encounter: Payer: Self-pay | Admitting: *Deleted

## 2022-02-08 NOTE — Progress Notes (Unsigned)
Sent scheduling message for lab/flush/OV on 1/23 or 1/30 and to call SNF to schedule and alert daughter of appointment as well.

## 2022-02-09 ENCOUNTER — Ambulatory Visit: Payer: Medicare PPO | Admitting: Physician Assistant

## 2022-02-10 ENCOUNTER — Other Ambulatory Visit: Payer: Self-pay

## 2022-02-11 ENCOUNTER — Other Ambulatory Visit: Payer: Self-pay

## 2022-02-11 ENCOUNTER — Emergency Department (HOSPITAL_COMMUNITY): Payer: Medicare PPO

## 2022-02-11 ENCOUNTER — Inpatient Hospital Stay (HOSPITAL_COMMUNITY)
Admission: EM | Admit: 2022-02-11 | Discharge: 2022-02-15 | DRG: 640 | Disposition: A | Discharge status: Hospice | Payer: Medicare PPO | Source: Skilled Nursing Facility | Attending: Internal Medicine | Admitting: Internal Medicine

## 2022-02-11 ENCOUNTER — Encounter (HOSPITAL_COMMUNITY): Payer: Self-pay | Admitting: Emergency Medicine

## 2022-02-11 DIAGNOSIS — R338 Other retention of urine: Secondary | ICD-10-CM | POA: Diagnosis present

## 2022-02-11 DIAGNOSIS — C159 Malignant neoplasm of esophagus, unspecified: Secondary | ICD-10-CM

## 2022-02-11 DIAGNOSIS — D630 Anemia in neoplastic disease: Secondary | ICD-10-CM | POA: Diagnosis present

## 2022-02-11 DIAGNOSIS — J3801 Paralysis of vocal cords and larynx, unilateral: Secondary | ICD-10-CM | POA: Diagnosis present

## 2022-02-11 DIAGNOSIS — E7800 Pure hypercholesterolemia, unspecified: Secondary | ICD-10-CM | POA: Diagnosis present

## 2022-02-11 DIAGNOSIS — R339 Retention of urine, unspecified: Secondary | ICD-10-CM | POA: Diagnosis present

## 2022-02-11 DIAGNOSIS — I10 Essential (primary) hypertension: Secondary | ICD-10-CM | POA: Diagnosis present

## 2022-02-11 DIAGNOSIS — C7801 Secondary malignant neoplasm of right lung: Secondary | ICD-10-CM | POA: Diagnosis present

## 2022-02-11 DIAGNOSIS — Y95 Nosocomial condition: Secondary | ICD-10-CM | POA: Diagnosis present

## 2022-02-11 DIAGNOSIS — C779 Secondary and unspecified malignant neoplasm of lymph node, unspecified: Secondary | ICD-10-CM | POA: Diagnosis present

## 2022-02-11 DIAGNOSIS — K2289 Other specified disease of esophagus: Secondary | ICD-10-CM | POA: Diagnosis present

## 2022-02-11 DIAGNOSIS — Z7189 Other specified counseling: Secondary | ICD-10-CM

## 2022-02-11 DIAGNOSIS — Z79899 Other long term (current) drug therapy: Secondary | ICD-10-CM

## 2022-02-11 DIAGNOSIS — Z6824 Body mass index (BMI) 24.0-24.9, adult: Secondary | ICD-10-CM

## 2022-02-11 DIAGNOSIS — C7951 Secondary malignant neoplasm of bone: Secondary | ICD-10-CM | POA: Diagnosis present

## 2022-02-11 DIAGNOSIS — D75839 Thrombocytosis, unspecified: Secondary | ICD-10-CM | POA: Diagnosis present

## 2022-02-11 DIAGNOSIS — R109 Unspecified abdominal pain: Secondary | ICD-10-CM | POA: Diagnosis present

## 2022-02-11 DIAGNOSIS — Z66 Do not resuscitate: Secondary | ICD-10-CM | POA: Diagnosis present

## 2022-02-11 DIAGNOSIS — A419 Sepsis, unspecified organism: Secondary | ICD-10-CM | POA: Diagnosis present

## 2022-02-11 DIAGNOSIS — U099 Post covid-19 condition, unspecified: Secondary | ICD-10-CM | POA: Diagnosis present

## 2022-02-11 DIAGNOSIS — R627 Adult failure to thrive: Secondary | ICD-10-CM | POA: Diagnosis present

## 2022-02-11 DIAGNOSIS — G9341 Metabolic encephalopathy: Secondary | ICD-10-CM | POA: Diagnosis not present

## 2022-02-11 DIAGNOSIS — Z515 Encounter for palliative care: Secondary | ICD-10-CM

## 2022-02-11 DIAGNOSIS — U071 COVID-19: Secondary | ICD-10-CM | POA: Diagnosis not present

## 2022-02-11 DIAGNOSIS — R0602 Shortness of breath: Principal | ICD-10-CM

## 2022-02-11 DIAGNOSIS — G620 Drug-induced polyneuropathy: Secondary | ICD-10-CM | POA: Diagnosis present

## 2022-02-11 DIAGNOSIS — J189 Pneumonia, unspecified organism: Secondary | ICD-10-CM

## 2022-02-11 DIAGNOSIS — C7802 Secondary malignant neoplasm of left lung: Secondary | ICD-10-CM | POA: Diagnosis present

## 2022-02-11 DIAGNOSIS — Z86010 Personal history of colonic polyps: Secondary | ICD-10-CM

## 2022-02-11 DIAGNOSIS — E440 Moderate protein-calorie malnutrition: Secondary | ICD-10-CM | POA: Diagnosis present

## 2022-02-11 DIAGNOSIS — F05 Delirium due to known physiological condition: Secondary | ICD-10-CM | POA: Diagnosis present

## 2022-02-11 DIAGNOSIS — K5900 Constipation, unspecified: Secondary | ICD-10-CM | POA: Diagnosis not present

## 2022-02-11 DIAGNOSIS — Z95828 Presence of other vascular implants and grafts: Secondary | ICD-10-CM

## 2022-02-11 DIAGNOSIS — Z87891 Personal history of nicotine dependence: Secondary | ICD-10-CM

## 2022-02-11 DIAGNOSIS — T451X5A Adverse effect of antineoplastic and immunosuppressive drugs, initial encounter: Secondary | ICD-10-CM | POA: Diagnosis present

## 2022-02-11 DIAGNOSIS — R1310 Dysphagia, unspecified: Secondary | ICD-10-CM | POA: Diagnosis present

## 2022-02-11 DIAGNOSIS — D649 Anemia, unspecified: Secondary | ICD-10-CM | POA: Diagnosis present

## 2022-02-11 DIAGNOSIS — E871 Hypo-osmolality and hyponatremia: Secondary | ICD-10-CM | POA: Diagnosis present

## 2022-02-11 DIAGNOSIS — F419 Anxiety disorder, unspecified: Secondary | ICD-10-CM | POA: Diagnosis present

## 2022-02-11 DIAGNOSIS — Z931 Gastrostomy status: Secondary | ICD-10-CM

## 2022-02-11 DIAGNOSIS — Z8249 Family history of ischemic heart disease and other diseases of the circulatory system: Secondary | ICD-10-CM

## 2022-02-11 LAB — CBC WITH DIFFERENTIAL/PLATELET
Abs Immature Granulocytes: 0.04 10*3/uL (ref 0.00–0.07)
Basophils Absolute: 0 10*3/uL (ref 0.0–0.1)
Basophils Relative: 0 %
Eosinophils Absolute: 0.2 10*3/uL (ref 0.0–0.5)
Eosinophils Relative: 2 %
HCT: 25.4 % — ABNORMAL LOW (ref 39.0–52.0)
Hemoglobin: 7.8 g/dL — ABNORMAL LOW (ref 13.0–17.0)
Immature Granulocytes: 0 %
Lymphocytes Relative: 13 %
Lymphs Abs: 1.4 10*3/uL (ref 0.7–4.0)
MCH: 26.9 pg (ref 26.0–34.0)
MCHC: 30.7 g/dL (ref 30.0–36.0)
MCV: 87.6 fL (ref 80.0–100.0)
Monocytes Absolute: 1.4 10*3/uL — ABNORMAL HIGH (ref 0.1–1.0)
Monocytes Relative: 13 %
Neutro Abs: 7.5 10*3/uL (ref 1.7–7.7)
Neutrophils Relative %: 72 %
Platelets: 307 10*3/uL (ref 150–400)
RBC: 2.9 MIL/uL — ABNORMAL LOW (ref 4.22–5.81)
RDW: 15.8 % — ABNORMAL HIGH (ref 11.5–15.5)
WBC: 10.5 10*3/uL (ref 4.0–10.5)
nRBC: 0 % (ref 0.0–0.2)

## 2022-02-11 LAB — RESP PANEL BY RT-PCR (RSV, FLU A&B, COVID)  RVPGX2
Influenza A by PCR: NEGATIVE
Influenza B by PCR: NEGATIVE
Resp Syncytial Virus by PCR: NEGATIVE
SARS Coronavirus 2 by RT PCR: POSITIVE — AB

## 2022-02-11 LAB — APTT: aPTT: 39 seconds — ABNORMAL HIGH (ref 24–36)

## 2022-02-11 LAB — COMPREHENSIVE METABOLIC PANEL
ALT: 24 U/L (ref 0–44)
AST: 28 U/L (ref 15–41)
Albumin: 2.2 g/dL — ABNORMAL LOW (ref 3.5–5.0)
Alkaline Phosphatase: 99 U/L (ref 38–126)
Anion gap: 7 (ref 5–15)
BUN: 19 mg/dL (ref 8–23)
CO2: 27 mmol/L (ref 22–32)
Calcium: 10.9 mg/dL — ABNORMAL HIGH (ref 8.9–10.3)
Chloride: 97 mmol/L — ABNORMAL LOW (ref 98–111)
Creatinine, Ser: 0.81 mg/dL (ref 0.61–1.24)
GFR, Estimated: >60 mL/min (ref >60)
Glucose, Bld: 140 mg/dL — ABNORMAL HIGH (ref 70–99)
Potassium: 3.7 mmol/L (ref 3.5–5.1)
Sodium: 131 mmol/L — ABNORMAL LOW (ref 135–145)
Total Bilirubin: 0.6 mg/dL (ref 0.3–1.2)
Total Protein: 8 g/dL (ref 6.5–8.1)

## 2022-02-11 LAB — LACTIC ACID, PLASMA
Lactic Acid, Venous: 1.8 mmol/L (ref 0.5–1.9)
Lactic Acid, Venous: 2 mmol/L (ref 0.5–1.9)

## 2022-02-11 LAB — PROTIME-INR
INR: 1.2 (ref 0.8–1.2)
Prothrombin Time: 14.9 seconds (ref 11.4–15.2)

## 2022-02-11 MED ORDER — VANCOMYCIN HCL 1750 MG/350ML IV SOLN
1750.0000 mg | INTRAVENOUS | Status: AC
  Administered 2022-02-11: 1750 mg via INTRAVENOUS
  Filled 2022-02-11: qty 350

## 2022-02-11 MED ORDER — VANCOMYCIN HCL IN DEXTROSE 1-5 GM/200ML-% IV SOLN: 1000.0000 mg | Freq: Once | INTRAVENOUS | Status: DC

## 2022-02-11 MED ORDER — IOHEXOL 300 MG/ML  SOLN
100.0000 mL | Freq: Once | INTRAMUSCULAR | Status: AC | PRN
  Administered 2022-02-11: 100 mL via INTRAVENOUS

## 2022-02-11 MED ORDER — SODIUM CHLORIDE 0.9 % IV SOLN
2.0000 g | Freq: Once | INTRAVENOUS | Status: AC
  Administered 2022-02-11: 2 g via INTRAVENOUS
  Filled 2022-02-11: qty 12.5

## 2022-02-11 MED ORDER — LACTATED RINGERS IV BOLUS (SEPSIS)
500.0000 mL | Freq: Once | INTRAVENOUS | Status: AC
  Administered 2022-02-11: 500 mL via INTRAVENOUS

## 2022-02-11 MED ORDER — LACTATED RINGERS IV SOLN: INTRAVENOUS | Status: AC

## 2022-02-11 NOTE — Subjective & Objective (Signed)
Presents with abdominal pain and foul-smelling urine patient has been confused recently diagnosed with pneumonia Patient has history of esophageal cancer on tube feeds Currently resides at nursing home patient have had increased work of breathing

## 2022-02-11 NOTE — H&P (Signed)
Leslie Livingston UXL:[id number] DOB: 21-Apr-1950 DOA: 02/11/2022     PCP: Lorene Dy, MD   Outpatient Specialists:      Oncology Dr. Learta Codding     Leslie Livingston arrived to ER on 02/11/22 at 2017 Referred by Attending Long, Wonda Olds, MD   Leslie Livingston coming from:     From facility  [facility]  Chief Complaint:   Chief Complaint  Leslie Livingston presents with   Abdominal Pain    HPI: Leslie Livingston is a 72 y.o. male with medical history significant of esophageal cancer, anemia, dyslipidemia, GERD, dysphagia    Presented with  altered complaining of abd pain Presents with abdominal pain and foul-smelling urine Leslie Livingston has been confused recently diagnosed with pneumonia Leslie Livingston has history of esophageal cancer on tube feeds Currently resides at nursing home Leslie Livingston have had increased work of breathing Was just admitted for pneumonia in December has known right pleural effusion MRi brain at the time showed no metastasis Family confirms that Leslie Livingston had COVID back in the end of December  Initial COVID TEST   POSITIVE,     Lab Results  Component Value Date   [place] (A) 02/11/2022   [place] NEGATIVE 09/23/2021   [place] NEGATIVE 09/18/2021   [place] NEGATIVE 06/10/2021     Regarding pertinent Chronic problems:       COPD - not  followed by pulmonology  Duoneb     Chronic anemia - baseline hg Hemoglobin & Hematocrit  Recent Labs    01/10/22 0235 01/11/22 0245 02/11/22 2037  HGB 8.6* 9.1* 7.8*   Metastatic esophageal cancer  While in ER:      CXR - Slightly more conspicuous 2.8 cm round right mid to lower lung zone airspace opacity as well as left upper lobe airspace opacity.  CTabd/pelvis -  Marked gastric distension.  Severe fecal retention throughout the colon, with a large amount of stool in the rectal vault consistent with fecal impaction  CT  chest - . Progressive circumferential wall thickening of the distal thoracic esophagus  consistent with known esophageal cancer. Continued areas of masslike consolidation within the left upper and right lower lobes. The right lower lobe consolidation is improved since prior study, and may reflect resolving pneumonia superimposed upon metastatic disease Worsening metastatic disease,  Following Medications were ordered in ER: Medications  lactated ringers infusion ( Intravenous New Bag/Given 02/11/22 2329)  vancomycin (VANCOREADY) IVPB 1750 mg/350 mL (1,750 mg Intravenous New Bag/Given 02/11/22 2322)  lactated ringers bolus 500 mL (0 mLs Intravenous Stopped 02/11/22 2330)  ceFEPIme (MAXIPIME) 2 g in sodium chloride 0.9 % 100 mL IVPB (0 g Intravenous Stopped 02/11/22 2259)  iohexol (OMNIPAQUE) 300 MG/ML solution 100 mL (100 mLs Intravenous Contrast Given 02/11/22 2258)       ED Triage Vitals  Enc Vitals Group     BP 02/11/22 2027 136/73     Pulse Rate 02/11/22 2027 (!) 102     Resp 02/11/22 2027 (!) 26     Temp 02/11/22 2053 99.9 F (37.7 C)     Temp Source 02/11/22 2053 Rectal     SpO2 02/11/22 2027 94 %     Weight 02/11/22 2025 188 lb 7.9 oz (85.5 kg)     Height 02/11/22 2025 '6\' 1"'$  (1.854 m)     Head Circumference --      Peak Flow --      Pain Score 02/11/22 2058 0     Pain Loc --      Pain  Edu? --      Excl. in [place]? --   TMAX(24)@     _________________________________________ Significant initial  Findings: Abnormal Labs Reviewed  RESP PANEL BY RT-PCR (RSV, FLU A&B, COVID)  RVPGX2 - Abnormal; Notable for the following components:      Result Value   SARS Coronavirus 2 by RT PCR POSITIVE (*)    All other components within normal limits  LACTIC ACID, PLASMA - Abnormal; Notable for the following components:   Lactic Acid, Venous 2.0 (*)    All other components within normal limits  COMPREHENSIVE METABOLIC PANEL - Abnormal; Notable for the following components:   Sodium 131 (*)    Chloride 97 (*)    Glucose, Bld 140 (*)    Calcium 10.9 (*)    Albumin 2.2 (*)     All other components within normal limits  CBC WITH DIFFERENTIAL/PLATELET - Abnormal; Notable for the following components:   RBC 2.90 (*)    Hemoglobin 7.8 (*)    HCT 25.4 (*)    RDW 15.8 (*)    Monocytes Absolute 1.4 (*)    All other components within normal limits  APTT - Abnormal; Notable for the following components:   aPTT 39 (*)    All other components within normal limits      ECG: Ordered Personally reviewed and interpreted by me showing: HR : 103 Rhythm: Sinus tachycardia Borderline T abnormalities, diffuse leads QTC 398   ____________________ This Leslie Livingston meets SIRS Criteria and may be septic.    The recent clinical data is shown below. Vitals:   02/11/22 2209 02/11/22 2215 02/11/22 2230 02/11/22 2330  BP: 121/75 111/69 113/66 122/66  Pulse: (!) 107 (!) 103 (!) 102 (!) 106  Resp: (!) 31 (!) 27 (!) 25 (!) 28  Temp:      TempSrc:      SpO2: 95% 96% 97% 95%  Weight:      Height:        WBC     Component Value Date/Time   WBC 10.5 02/11/2022 2037   LYMPHSABS 1.4 02/11/2022 2037   MONOABS 1.4 (H) 02/11/2022 2037   EOSABS 0.2 02/11/2022 2037   BASOSABS 0.0 02/11/2022 2037    Lactic Acid, Venous    Component Value Date/Time   LATICACIDVEN 2.0 (HH) 02/11/2022 2200     Procalcitonin   Ordered     UA  ordered   Urine analysis:    Component Value Date/Time   COLORURINE YELLOW 01/07/2022 [place] 01/07/2022 1137   LABSPEC 1.021 01/07/2022 1137   PHURINE 5.5 01/07/2022 1137   GLUCOSEU NEGATIVE 01/07/2022 1137   HGBUR NEGATIVE 01/07/2022 1137   HGBUR negative 05/02/2007 0759   BILIRUBINUR NEGATIVE 01/07/2022 1137   KETONESUR NEGATIVE 01/07/2022 1137   PROTEINUR TRACE (A) 01/07/2022 1137   UROBILINOGEN 0.2 11/28/2010 1436   NITRITE NEGATIVE 01/07/2022 1137   LEUKOCYTESUR NEGATIVE 01/07/2022 1137    Results for orders placed or performed during the hospital encounter of 02/11/22  Resp panel by RT-PCR (RSV, Flu A&B, Covid) Anterior  Nasal Swab     Status: Abnormal   Collection Time: 02/11/22  8:37 PM   Specimen: Anterior Nasal Swab  Result Value Ref Range Status   SARS Coronavirus 2 by RT PCR POSITIVE (A) NEGATIVE Final         Influenza A by PCR NEGATIVE NEGATIVE Final   Influenza B by PCR NEGATIVE NEGATIVE Final  Resp Syncytial Virus by PCR NEGATIVE NEGATIVE Final          _______________________________________________ Hospitalist was called for admission for   COVID-19 SIRS     The following Work up has been ordered so far:  Orders Placed This Encounter  Procedures   Resp panel by RT-PCR (RSV, Flu A&B, Covid) Anterior Nasal Swab   Blood Culture (routine x 2)   Urine Culture   Resp panel by RT-PCR (RSV, Flu A&B, Covid) Anterior Nasal Swab   DG Chest Port 1 View   CT CHEST ABDOMEN PELVIS W CONTRAST   Lactic acid, plasma   Comprehensive metabolic panel   CBC with Differential   Protime-INR   APTT   Urinalysis, Routine w reflex microscopic   Brain natriuretic peptide   Diet NPO time specified   Cardiac monitoring   Document height and weight   Assess and Document [place] Coma Scale   Document vital signs within 1-hour of fluid bolus completion. Notify provider of abnormal vital signs despite fluid resuscitation.   DO NOT delay antibiotics if unable to obtain blood culture.   Refer to Sidebar Report: Sepsis Sidebar ED/IP   Notify provider for difficulties obtaining IV access.   Insert peripheral IV x 2   Initiate Carrier Fluid Protocol   DO NOT delay antibiotics if unable to obtain blood culture.   Consult to hospitalist   Airborne and Contact precautions   Pulse oximetry, continuous   EKG 12-Lead   Insert 2nd peripheral IV if not already present.     OTHER Significant initial  Findings:  labs showing:    Recent Labs  Lab 02/11/22 2037  NA 131*  K 3.7  CO2 27  GLUCOSE 140*  BUN 19  CREATININE 0.81  CALCIUM 10.9*    Cr   stable,   Lab Results  Component Value Date    CREATININE 0.81 02/11/2022   CREATININE 0.72 01/11/2022   CREATININE 0.64 01/10/2022    Recent Labs  Lab 02/11/22 2037  AST 28  ALT 24  ALKPHOS 99  BILITOT 0.6  PROT 8.0  ALBUMIN 2.2*   Lab Results  Component Value Date   CALCIUM 10.9 (H) 02/11/2022   PHOS 2.9 01/09/2022    Plt: Lab Results  Component Value Date   PLT 307 02/11/2022    Venous  Blood Gas result:  pH  7.44 High  Acid-base deficit 1.1 mmol/L   pCO2, Ven 33 Low  mmHg O2 Saturation 79.5 %  pO2, Ven 47 High  mmHg        Recent Labs  Lab 02/11/22 2037  WBC 10.5  NEUTROABS 7.5  HGB 7.8*  HCT 25.4*  MCV 87.6  PLT 307    HG/HCT   Down  from baseline see below    Component Value Date/Time   HGB 7.8 (L) 02/11/2022 2037   HGB 10.3 (L) 01/07/2022 0827   HCT 25.4 (L) 02/11/2022 2037   MCV 87.6 02/11/2022 2037        .car BNP (last 3 results) Recent Labs    09/18/21 1311  BNP 54.7     DM  labs:  HbA1C: No results for input(s): "HGBA1C" in the last 8760 hours.     CBG (last 3)  No results for input(s): "GLUCAP" in the last 72 hours.        Cultures:    Component Value Date/Time   SDES  01/07/2022 1416    BLOOD RIGHT ANTECUBITAL Performed at [facility],  [address] [place], [place] [address]    [facility]  01/07/2022 1416    Blood Culture adequate volume BOTTLES DRAWN AEROBIC AND ANAEROBIC Performed at [facility], [address] [place], [place] [address]    CULT  01/07/2022 1416    NO GROWTH 5 DAYS Performed at [facility] [address], [place], [place] [address]    REPTSTATUS 01/12/2022 FINAL 01/07/2022 1416     Radiological Exams on Admission: CT CHEST ABDOMEN PELVIS W CONTRAST  Result Date: 02/11/2022 CLINICAL DATA:  Abdominal pain, altered level of consciousness, pneumonia, sepsis, esophageal cancer. EXAM: CT CHEST, ABDOMEN, AND PELVIS WITH CONTRAST TECHNIQUE: Multidetector CT imaging of the chest, abdomen and  pelvis was performed following the standard protocol during bolus administration of intravenous contrast. RADIATION DOSE REDUCTION: This exam was performed according to the departmental dose-optimization program which includes automated exposure control, adjustment of the mA and/or kV according to Leslie Livingston size and/or use of iterative reconstruction technique. CONTRAST:  164m OMNIPAQUE IOHEXOL 300 MG/ML  SOLN COMPARISON:  02/11/2022, 01/07/2022. FINDINGS: CT CHEST FINDINGS Cardiovascular: The heart is unremarkable without pericardial effusion. No evidence of thoracic aortic aneurysm or dissection. Atherosclerosis of the aorta and coronary vasculature. Stable left chest wall port. Mediastinum/Nodes: Progressive circumferential wall thickening of the distal thoracic esophagus measuring up to 2.9 cm in diameter, compatible with progressive esophageal cancer. Thyroid and trachea are unremarkable. Progressive left supraclavicular lymphadenopathy, largest lymph node now measuring up to 3 cm in short axis, previously having measured 1.2 cm. Continued mediastinal and hilar adenopathy. Index lymph node right paratracheal region measures 1 cm, stable. Lungs/Pleura: Interval progression of the known pulmonary metastases. Index lesion within the right upper lobe now measures 3.4 x 3.5 cm reference image 39/6, previously measuring 2.2 x 1.5 cm. Masslike consolidation within the left upper and right lower lobes again noted. There is slight improved aeration within the right lung base. There are small bilateral pleural effusions which have developed since prior study, left greater than right. No pneumothorax. Musculoskeletal: Interval progression of metastatic lesion within the left posterior tenth rib, with increased cortical destruction and associated soft tissue mass. Reconstructed images demonstrate no additional findings. CT ABDOMEN PELVIS FINDINGS Hepatobiliary: No focal liver abnormality is seen. No gallstones, gallbladder  wall thickening, or biliary dilatation. Pancreas: Unremarkable. No pancreatic ductal dilatation or surrounding inflammatory changes. Spleen: Normal in size without focal abnormality. Adrenals/Urinary Tract: Adrenal glands are unremarkable. Kidneys are normal, without renal calculi, focal lesion, or hydronephrosis. Bladder is unremarkable. Stomach/Bowel: No bowel obstruction or ileus. Normal appendix right lower quadrant. Large amount of retained stool throughout the colon, most pronounced within the rectal vault consistent with constipation and fecal impaction. No bowel wall thickening or inflammatory change. Percutaneous gastrostomy tube within the gastric lumen. The stomach is markedly distended. Vascular/Lymphatic: Atherosclerosis of the aorta and its branches. Enlarged lymph node within the gastrohepatic ligament measuring 1.4 cm image 60/2. No other discrete adenopathy within the abdomen or pelvis. Reproductive: Prostate is unremarkable. Other: No free fluid or free intraperitoneal gas. No abdominal wall hernia. Musculoskeletal: There are no acute or destructive bony lesions. There is bilateral hip osteoarthritis, right greater than left. Reconstructed images demonstrate no additional findings. IMPRESSION: 1. Progressive circumferential wall thickening of the distal thoracic esophagus consistent with known esophageal cancer. 2. Worsening metastatic disease, with progressive lymphadenopathy, new and enlarging bilateral pulmonary metastases, and progressive bony metastatic disease within the left posterior tenth rib. 3. Continued areas of masslike consolidation within the left upper and right  lower lobes. The right lower lobe consolidation is improved since prior study, and may reflect resolving pneumonia superimposed upon metastatic disease. 4. Enlarged lymph node within the gastrohepatic ligament concerning for subdiaphragmatic metastatic disease. 5. Marked gastric distension. Percutaneous gastrostomy tube is  seen within the gastric lumen. 6. Severe fecal retention throughout the colon, with a large amount of stool in the rectal vault consistent with fecal impaction. No bowel obstruction. 7.  Aortic Atherosclerosis (ICD10-I70.0). Electronically Signed   By: Randa Ngo M.D.   On: 02/11/2022 23:25   DG Chest Port 1 View  Result Date: 02/11/2022 CLINICAL DATA:  [id number] Sepsis [facility]) [id number] EXAM: PORTABLE CHEST 1 VIEW COMPARISON:  Chest x-ray 01/11/2022, CT angio chest 01/07/2022 FINDINGS: Left chest wall Port-A-Cath with tip overlying the superior cavoatrial junction. The heart and mediastinal contours are within normal limits. Slightly more conspicuous 2.8 cm round right mid to lower lung zone airspace opacity as well as left upper lobe airspace opacity. No pulmonary edema. No pleural effusion. No pneumothorax. No acute osseous abnormality. IMPRESSION: Slightly more conspicuous 2.8 cm round right mid to lower lung zone airspace opacity as well as left upper lobe airspace opacity. Finding could be combination of infection/inflammation versus malignancy. Recommend CT chest with intravenous contrast for further evaluation. Electronically Signed   By: Iven Finn M.D.   On: 02/11/2022 21:50   _______________________________________________________________________________________________________ Latest  Blood pressure 122/66, pulse (!) 106, temperature 99.9 F (37.7 C), temperature source Rectal, resp. rate (!) 28, height '6\' 1"'$  (1.854 m), weight 85.5 kg, SpO2 95 %.   Vitals  labs and radiology finding personally reviewed  Review of Systems:    Pertinent positives include:   chills, fatigue, confusion Constitutional:  No weight loss, night sweats, Fevers, weight loss  HEENT:  No headaches, Difficulty swallowing,Tooth/dental problems,Sore throat,  No sneezing, itching, ear ache, nasal congestion, post nasal drip,  Cardio-vascular:  No chest pain, Orthopnea, PND, anasarca, dizziness, palpitations.no  Bilateral lower extremity swelling  GI:  No heartburn, indigestion, abdominal pain, nausea, vomiting, diarrhea, change in bowel habits, loss of appetite, melena, blood in stool, hematemesis Resp:  no shortness of breath at rest. No dyspnea on exertion, No excess mucus, no productive cough, No non-productive cough, No coughing up of blood.No change in color of mucus.No wheezing. Skin:  no rash or lesions. No jaundice GU:  no dysuria, change in color of urine, no urgency or frequency. No straining to urinate.  No flank pain.  Musculoskeletal:  No joint pain or no joint swelling. No decreased range of motion. No back pain.  Psych:  No change in mood or affect. No depression or anxiety. No memory loss.  Neuro: no localizing neurological complaints, no tingling, no weakness, no double vision, no gait abnormality, no slurred speech, no   All systems reviewed and apart from [place] all are negative _______________________________________________________________________________________________ Past Medical History:   Past Medical History:  Diagnosis Date   Allergy    Arthritis    Hyperlipidemia    Hypertension      Past Surgical History:  Procedure Laterality Date   BIOPSY  08/07/2021   Procedure: BIOPSY;  Surgeon: Milus Banister, MD;  Location: [facility];  Service: Gastroenterology;;   COLONOSCOPY WITH PROPOFOL N/A 05/05/2018   Procedure: COLONOSCOPY WITH PROPOFOL;  Surgeon: Irene Shipper, MD;  Location: [facility];  Service: Endoscopy;  Laterality: N/A;   ESOPHAGOGASTRODUODENOSCOPY (EGD) WITH PROPOFOL N/A 08/07/2021   Procedure: ESOPHAGOGASTRODUODENOSCOPY (EGD) WITH PROPOFOL;  Surgeon: Milus Banister, MD;  Location:  [facility];  Service: Gastroenterology;  Laterality: N/A;   GASTROSTOMY N/A 08/10/2021   Procedure: INSERTION OF GASTROSTOMY TUBE;  Surgeon: Coralie Keens, MD;  Location: [facility];  Service: General;  Laterality: N/A;   IR [place] W/FLUORO   09/16/2021   PORTACATH PLACEMENT N/A 08/10/2021   Procedure: INSERTION PORT-A-CATH;  Surgeon: Coralie Keens, MD;  Location: [facility];  Service: General;  Laterality: N/A;    Social History:     reports that he quit smoking about 8 months ago. His smoking use included cigarettes. He smoked an average of .25 packs per day. He has been exposed to tobacco smoke. He has never used smokeless tobacco. He reports current alcohol use of about 6.0 standard drinks of alcohol per week. He reports that he does not use drugs.     Family History:  Family History  Problem Relation Age of Onset   Hypertension Other    Colon cancer Neg Hx    Stomach cancer Neg Hx    Esophageal cancer Neg Hx    Rectal cancer Neg Hx    ______________________________________________________________________________________________ Allergies: No Known Allergies   Prior to Admission medications   Medication Sig Start Date End Date Taking? Authorizing Provider  famotidine (PEPCID) 40 MG/5ML suspension Place 2.5 mLs (20 mg total) into feeding tube at bedtime. 01/11/22   Lavina Hamman, MD  guaiFENesin (ROBITUSSIN) 100 MG/5ML liquid Place 5 mLs into feeding tube every 4 (four) hours as needed for cough or to loosen phlegm. 01/11/22   Lavina Hamman, MD  ipratropium-albuterol (DUONEB) 0.5-2.5 (3) MG/3ML SOLN Take 3 mLs by nebulization 3 (three) times daily. 01/11/22   Lavina Hamman, MD  lactobacillus (FLORANEX/LACTINEX) PACK Place 1 packet (1 g total) into feeding tube 3 (three) times daily. 01/11/22   Lavina Hamman, MD  lidocaine-prilocaine (EMLA) cream Apply 1 Application topically as needed. 08/26/21   Ladell Pier, MD  melatonin 3 MG TABS tablet Place 1 tablet (3 mg total) into feeding tube at bedtime as needed. 01/11/22   Lavina Hamman, MD  Nutritional Supplements (FEEDING SUPPLEMENT, OSMOLITE 1.5 CAL,) LIQD Place 1,000 mLs into feeding tube continuous. Rate is 55 cc/hr. 08/16/21   Swayze, Ava, DO  promethazine  (PHENERGAN) 6.25 MG/5ML syrup Take 12.5 mg (10 milliliters) via G-Tube every 6 hours as needed for nausea and vomiting. 10/13/21   Ladell Pier, MD  Protein (FEEDING SUPPLEMENT, PROSOURCE TF20,) liquid Place 60 mLs into feeding tube daily. 01/11/22   Lavina Hamman, MD  Water For Irrigation, Sterile (FREE WATER) SOLN Place 150 mLs into feeding tube every 4 (four) hours. Rate is 55 cc/ hr. 08/16/21   Swayze, Ava, DO    ___________________________________________________________________________________________________ Physical Exam:    02/11/2022   11:30 PM 02/11/2022   10:30 PM 02/11/2022   10:15 PM  Vitals with BMI  Systolic 540 981 191  Diastolic 66 66 69  Pulse 478 102 103     1. General:  in No  Acute distress   Chronically ill   -appearing 2. Psychological: Alert and   Oriented 3. Head/ENT:   Dry Mucous Membranes                          Head Non traumatic, neck supple                          Poor Dentition 4. SKIN:  decreased Skin  turgor,  Skin clean Dry and intact no rash 5. Heart: Regular rate and rhythm no  Murmur, no Rub or gallop 6. Lungs , no wheezes some crackles   7. Abdomen: Soft,  non-tender,  distended  bowel sounds present 8. Lower extremities: no clubbing, cyanosis, no  edema 9. Neurologically Grossly intact, moving all 4 extremities equally  10. MSK: Normal range of motion    Chart has been reviewed  ______________________________________________________________________________________________  Assessment/Plan   72 y.o. male with medical history significant of esophageal cancer, anemia, dyslipidemia, GERD, dysphagia   Admitted for   COVID-19, SIRS obstipation, confusion     Present on Admission:  COVID-19 virus infection  ANEMIA-NOS  Essential hypertension  Malnutrition of moderate degree  Esophageal mass  Sepsis ([place])  HCAP (healthcare-associated pneumonia)  Obstipation  Acute urinary retention     COVID-19 virus infection Family states  that Leslie Livingston had covid last month possibly residual  Supportive management  ANEMIA-NOS Order anemia panel  Transfuse as needed  for hg < 7.5   Essential hypertension Allow permissive HTN   Malnutrition of moderate degree Will check prealbumin nutritional consult in AM  Esophageal mass Will notify dr. Benay Spice that Leslie Livingston has been admitted will need goals of care discussion  Sepsis ([place])  -SIRS criteria met with     tachycardia     RR >20 Today's Vitals   02/11/22 2209 02/11/22 2215 02/11/22 2230 02/11/22 2330  BP: 121/75 111/69 113/66 122/66  Pulse: (!) 107 (!) 103 (!) 102 (!) 106  Resp: (!) 31 (!) 27 (!) 25 (!) 28  Temp:      TempSrc:      SpO2: 95% 96% 97% 95%  Weight:      Height:      PainSc:         The recent clinical data is shown below. Vitals:   02/11/22 2209 02/11/22 2215 02/11/22 2230 02/11/22 2330  BP: 121/75 111/69 113/66 122/66  Pulse: (!) 107 (!) 103 (!) 102 (!) 106  Resp: (!) 31 (!) 27 (!) 25 (!) 28  Temp:      TempSrc:      SpO2: 95% 96% 97% 95%  Weight:      Height:          -Most likely source being:  pulmonary, vs viral    Source of sepsis is unknown but given clinical picture will continue to treat   Leslie Livingston meeting criteria for Severe sepsis with    evidence of end organ damage/organ dysfunction such as      elevated lactic acid >2     Component Value Date/Time   LATICACIDVEN 2.0 ([place]) 02/11/2022 8938    acute metabolic encephalopathy          - Obtain serial lactic acid and procalcitonin level.  - Initiated IV antibiotics in ER: Antibiotics Given (last 72 hours)     Date/Time Action Medication Dose Rate   02/11/22 2220 New Bag/Given   ceFEPIme (MAXIPIME) 2 g in sodium chloride 0.9 % 100 mL IVPB 2 g 200 mL/hr   02/11/22 2322 New Bag/Given   vancomycin (VANCOREADY) IVPB 1750 mg/350 mL 1,750 mg 175 mL/hr       Will continue  on : cefepime. Vanc flagyl   - await results of blood and urine culture  - Rehydrate aggressively   Intravenous fluids were administered,     12:10 AM   Obstipation Order bowel regimen  HCAP (healthcare-associated pneumonia) For now broad spectrum ABX for today  Await result of sputum culture  Procalcitionin Possibly post vIRAr PNA vs post obstructive    Acute urinary retention After I/o 1200 ml of urine obtained Continue to monitor for urinary retention check post void residual and if >300 place Foley  Other plan as per orders.  DVT prophylaxis:  SCD      Code Status:  DNR/DNI   as per family  I had personally discussed CODE STATUS with Leslie Livingston and family  Family would like to avoid over aggressive interventions but still would like to continue with IV fluids and antibiotics amendable to palliative care consult in a.m.   Family Communication:   Family   at  Bedside  plan of care was discussed   with   Daughter   Disposition Plan:     Back to current facility when stable                              Following barriers for discharge:                            Electrolytes corrected                                                           Will need consultants to evaluate Leslie Livingston prior to discharge                       Would benefit from Leslie Livingston/OT eval prior to DC  Ordered                                      Transition of care consulted                   Nutrition    consulted                                    Palliative care    consulted                                Consults called:    emailed Dr. Benay Spice   Admission status:  ED Disposition     ED Disposition  Rafael Gonzalez: [place] [[id number]]  Level of Care: Progressive [102]  Admit to Progressive based on following criteria: RESPIRATORY PROBLEMS hypoxemic/hypercapnic respiratory failure that is responsive to NIPPV (BiPAP) or High Flow Nasal Cannula (6-80 lpm). Frequent assessment/intervention, no > Q2 hrs < Q4 hrs, to maintain oxygenation and  pulmonary hygiene.  May place Leslie Livingston in observation at [facility] or [place] if equivalent level of care is available:: No  Covid Evaluation: Confirmed COVID Positive  Diagnosis: COVID-19 virus infection [[id number]]  Admitting Physician: Toy Baker [[id number]]  Attending Physician: Toy Baker [[id number]]           Obs    Level of care         progressive tele indefinitely please  discontinue once Leslie Livingston no longer qualifies COVID-19 Labs    Lab Results  Component Value Date   [place] (A) 02/11/2022     Precautions: admitted as  covid positive Airborne and Contact precautions    Haille Pardi 02/12/2022, 1:39 AM    [facility]     after 2 AM please page floor coverage PA If 7AM-7PM, please contact the day team taking care of the Leslie Livingston using [email or web address]   Leslie Livingston was evaluated in the context of the global COVID-19 pandemic, which necessitated consideration that the Leslie Livingston might be at risk for infection with the SARS-CoV-2 virus that causes COVID-19. Institutional protocols and algorithms that pertain to the evaluation of patients at risk for COVID-19 are in a state of rapid change based on information released by regulatory bodies including the CDC and federal and state organizations. These policies and algorithms were followed during the Leslie Livingston's care.

## 2022-02-11 NOTE — ED Provider Notes (Signed)
Emergency Department Provider Note   I have reviewed the triage vital signs and the nursing notes.   HISTORY  Chief Complaint Abdominal Pain   HPI Leslie Livingston is a 72 y.o. male with past medical history including esophageal cancer on tube feeds, hypertension, high cholesterol, admitted in December for pneumonia and sepsis presents to the ED from SNF with report of worsening lung sounds, abdominal pain, foul-smelling urine.  MS reports significant rhonchi on their assessment and some increased work of breathing. Patient with limited ability to communicate verbally at this time. Level 5 caveat applies.    Past Medical History:  Diagnosis Date   Allergy    Arthritis    Hyperlipidemia    Hypertension     Review of Systems  Level 5 caveat: Limited verbal ability.  ____________________________________________   PHYSICAL EXAM:  VITAL SIGNS: ED Triage Vitals  Enc Vitals Group     BP 02/11/22 2027 136/73     Pulse Rate 02/11/22 2027 (!) 102     Resp 02/11/22 2027 (!) 26     Temp --      Temp src --      SpO2 02/11/22 2027 94 %     Weight 02/11/22 2025 188 lb 7.9 oz (85.5 kg)     Height 02/11/22 2025 '6\' 1"'$  (1.854 m)    Constitutional: Alert. Making eye contact and gesturing. Not communicating verbally.  Eyes: Conjunctivae are normal. Head: Atraumatic. Nose: No congestion/rhinnorhea. Mouth/Throat: Mucous membranes are dry.  Neck: No stridor.   Cardiovascular: Tachycardia. Good peripheral circulation. Grossly normal heart sounds.   Respiratory: Increased respiratory effort.  No retractions. Lungs with rhonchi at the bases bilaterally.  Gastrointestinal: Soft without apparent tenderness. No distention.  Musculoskeletal: No gross deformities of extremities. Neurologic: Alert and making eye contact.  Skin:  Skin is warm, dry and intact. No rash noted.  ____________________________________________   LABS (all labs ordered are listed, but only abnormal results  are displayed)  Labs Reviewed  RESP PANEL BY RT-PCR (RSV, FLU A&B, COVID)  RVPGX2 - Abnormal; Notable for the following components:      Result Value   SARS Coronavirus 2 by RT PCR POSITIVE (*)    All other components within normal limits  LACTIC ACID, PLASMA - Abnormal; Notable for the following components:   Lactic Acid, Venous 2.0 (*)    All other components within normal limits  COMPREHENSIVE METABOLIC PANEL - Abnormal; Notable for the following components:   Sodium 131 (*)    Chloride 97 (*)    Glucose, Bld 140 (*)    Calcium 10.9 (*)    Albumin 2.2 (*)    All other components within normal limits  CBC WITH DIFFERENTIAL/PLATELET - Abnormal; Notable for the following components:   RBC 2.90 (*)    Hemoglobin 7.8 (*)    HCT 25.4 (*)    RDW 15.8 (*)    Monocytes Absolute 1.4 (*)    All other components within normal limits  APTT - Abnormal; Notable for the following components:   aPTT 39 (*)    All other components within normal limits  CULTURE, BLOOD (ROUTINE X 2)  CULTURE, BLOOD (ROUTINE X 2)  URINE CULTURE  RESP PANEL BY RT-PCR (RSV, FLU A&B, COVID)  RVPGX2  LACTIC ACID, PLASMA  PROTIME-INR  URINALYSIS, ROUTINE W REFLEX MICROSCOPIC  BRAIN NATRIURETIC PEPTIDE  LACTIC ACID, PLASMA  LACTIC ACID, PLASMA  PROCALCITONIN  AMMONIA  BLOOD GAS, VENOUS  CK  CREATININE, URINE, RANDOM  OSMOLALITY, URINE  OSMOLALITY  MAGNESIUM  PHOSPHORUS  PREALBUMIN  SODIUM, URINE, RANDOM  TSH  VITAMIN B12  FOLATE  IRON AND TIBC  FERRITIN  RETICULOCYTES   ____________________________________________  EKG   EKG Interpretation  Date/Time:  Thursday February 11 2022 20:28:48 EST Ventricular Rate:  103 PR Interval:  155 QRS Duration: 80 QT Interval:  304 QTC Calculation: 398 R Axis:   69 Text Interpretation: Sinus tachycardia Borderline T abnormalities, diffuse leads Confirmed by Nanda Quinton [phone]) on 02/11/2022 8:37:39 PM         ____________________________________________  RADIOLOGY  CT CHEST ABDOMEN PELVIS W CONTRAST  Result Date: 02/11/2022 CLINICAL DATA:  Abdominal pain, altered level of consciousness, pneumonia, sepsis, esophageal cancer. EXAM: CT CHEST, ABDOMEN, AND PELVIS WITH CONTRAST TECHNIQUE: Multidetector CT imaging of the chest, abdomen and pelvis was performed following the standard protocol during bolus administration of intravenous contrast. RADIATION DOSE REDUCTION: This exam was performed according to the departmental dose-optimization program which includes automated exposure control, adjustment of the mA and/or kV according to patient size and/or use of iterative reconstruction technique. CONTRAST:  188m OMNIPAQUE IOHEXOL 300 MG/ML  SOLN COMPARISON:  02/11/2022, 01/07/2022. FINDINGS: CT CHEST FINDINGS Cardiovascular: The heart is unremarkable without pericardial effusion. No evidence of thoracic aortic aneurysm or dissection. Atherosclerosis of the aorta and coronary vasculature. Stable left chest wall port. Mediastinum/Nodes: Progressive circumferential wall thickening of the distal thoracic esophagus measuring up to 2.9 cm in diameter, compatible with progressive esophageal cancer. Thyroid and trachea are unremarkable. Progressive left supraclavicular lymphadenopathy, largest lymph node now measuring up to 3 cm in short axis, previously having measured 1.2 cm. Continued mediastinal and hilar adenopathy. Index lymph node right paratracheal region measures 1 cm, stable. Lungs/Pleura: Interval progression of the known pulmonary metastases. Index lesion within the right upper lobe now measures 3.4 x 3.5 cm reference image 39/6, previously measuring 2.2 x 1.5 cm. Masslike consolidation within the left upper and right lower lobes again noted. There is slight improved aeration within the right lung base. There are small bilateral pleural effusions which have developed since prior study, left greater than right.  No pneumothorax. Musculoskeletal: Interval progression of metastatic lesion within the left posterior tenth rib, with increased cortical destruction and associated soft tissue mass. Reconstructed images demonstrate no additional findings. CT ABDOMEN PELVIS FINDINGS Hepatobiliary: No focal liver abnormality is seen. No gallstones, gallbladder wall thickening, or biliary dilatation. Pancreas: Unremarkable. No pancreatic ductal dilatation or surrounding inflammatory changes. Spleen: Normal in size without focal abnormality. Adrenals/Urinary Tract: Adrenal glands are unremarkable. Kidneys are normal, without renal calculi, focal lesion, or hydronephrosis. Bladder is unremarkable. Stomach/Bowel: No bowel obstruction or ileus. Normal appendix right lower quadrant. Large amount of retained stool throughout the colon, most pronounced within the rectal vault consistent with constipation and fecal impaction. No bowel wall thickening or inflammatory change. Percutaneous gastrostomy tube within the gastric lumen. The stomach is markedly distended. Vascular/Lymphatic: Atherosclerosis of the aorta and its branches. Enlarged lymph node within the gastrohepatic ligament measuring 1.4 cm image 60/2. No other discrete adenopathy within the abdomen or pelvis. Reproductive: Prostate is unremarkable. Other: No free fluid or free intraperitoneal gas. No abdominal wall hernia. Musculoskeletal: There are no acute or destructive bony lesions. There is bilateral hip osteoarthritis, right greater than left. Reconstructed images demonstrate no additional findings. IMPRESSION: 1. Progressive circumferential wall thickening of the distal thoracic esophagus consistent with known esophageal cancer. 2. Worsening metastatic disease, with progressive lymphadenopathy, new and enlarging bilateral pulmonary metastases, and progressive bony metastatic  disease within the left posterior tenth rib. 3. Continued areas of masslike consolidation within the  left upper and right lower lobes. The right lower lobe consolidation is improved since prior study, and may reflect resolving pneumonia superimposed upon metastatic disease. 4. Enlarged lymph node within the gastrohepatic ligament concerning for subdiaphragmatic metastatic disease. 5. Marked gastric distension. Percutaneous gastrostomy tube is seen within the gastric lumen. 6. Severe fecal retention throughout the colon, with a large amount of stool in the rectal vault consistent with fecal impaction. No bowel obstruction. 7.  Aortic Atherosclerosis (ICD10-I70.0). Electronically Signed   By: Randa Ngo M.D.   On: 02/11/2022 23:25   DG Chest Port 1 View  Result Date: 02/11/2022 CLINICAL DATA:  [id number] Sepsis [facility]) [id number] EXAM: PORTABLE CHEST 1 VIEW COMPARISON:  Chest x-ray 01/11/2022, CT angio chest 01/07/2022 FINDINGS: Left chest wall Port-A-Cath with tip overlying the superior cavoatrial junction. The heart and mediastinal contours are within normal limits. Slightly more conspicuous 2.8 cm round right mid to lower lung zone airspace opacity as well as left upper lobe airspace opacity. No pulmonary edema. No pleural effusion. No pneumothorax. No acute osseous abnormality. IMPRESSION: Slightly more conspicuous 2.8 cm round right mid to lower lung zone airspace opacity as well as left upper lobe airspace opacity. Finding could be combination of infection/inflammation versus malignancy. Recommend CT chest with intravenous contrast for further evaluation. Electronically Signed   By: Iven Finn M.D.   On: 02/11/2022 21:50    ____________________________________________   PROCEDURES  Procedure(s) performed:   Procedures  CRITICAL CARE Performed by: Margette Fast Total critical care time: 35 minutes Critical care time was exclusive of separately billable procedures and treating other patients. Critical care was necessary to treat or prevent imminent or life-threatening deterioration. Critical  care was time spent personally by me on the following activities: development of treatment plan with patient and/or surrogate as well as nursing, discussions with consultants, evaluation of patient's response to treatment, examination of patient, obtaining history from patient or surrogate, ordering and performing treatments and interventions, ordering and review of laboratory studies, ordering and review of radiographic studies, pulse oximetry and re-evaluation of patient's condition.  Nanda Quinton, MD Emergency Medicine  ____________________________________________   INITIAL IMPRESSION / ASSESSMENT AND PLAN / ED COURSE  Pertinent labs & imaging results that were available during my care of the patient were reviewed by me and considered in my medical decision making (see chart for details).   This patient is Presenting for Evaluation of SOB, which does require a range of treatment options, and is a complaint that involves a high risk of morbidity and mortality.  The Differential Diagnoses includes but is not exclusive to alcohol, illicit or prescription medications, intracranial pathology such as stroke, intracerebral hemorrhage, fever or infectious causes including sepsis, hypoxemia, uremia, trauma, endocrine related disorders such as diabetes, hypoglycemia, thyroid-related diseases, etc.   Critical Interventions-    Medications  lactated ringers infusion ( Intravenous New Bag/Given 02/11/22 2329)  vancomycin (VANCOREADY) IVPB 1750 mg/350 mL (1,750 mg Intravenous New Bag/Given 02/11/22 2322)  metroNIDAZOLE (FLAGYL) IVPB 500 mg (has no administration in time range)  lactated ringers bolus 500 mL (0 mLs Intravenous Stopped 02/11/22 2330)  ceFEPIme (MAXIPIME) 2 g in sodium chloride 0.9 % 100 mL IVPB (0 g Intravenous Stopped 02/11/22 2259)  iohexol (OMNIPAQUE) 300 MG/ML solution 100 mL (100 mLs Intravenous Contrast Given 02/11/22 2258)    Reassessment after intervention:     I did obtain  Additional Historical Information from  EMS, as the patient is less verbally responsive.   I decided to review pertinent External Data, and in summary patient with discharge in mid December 2023 with sepsis related to PNA. Transitioned to abx per tube. Tube feeds and meds at this time. Thin liquids for pleasure only.    Clinical Laboratory Tests Ordered, included lactic acid mildly elevated 2.0.  COVID-positive but question if this could be residual results.  Daughter reporting COVID-positive result 1 month prior.  No acute kidney injury.  Radiologic Tests Ordered, included CXR and CT chest/abd/pelvis. I independently interpreted the images and agree with radiology interpretation.   Consult complete with [facility], plan for admit for abx and continued resuscitation.  Medical Decision Making: Summary:  Patient presents to the emergency department with increased work of breathing, reported abdominal pain/foul-smelling urine.  Rhonchorous lung sounds on exam with hypoxemia on supplemental O2.   Reevaluation with update and discussion with patient and daughter at bedside.  We had a discussion regarding the patient's CODE STATUS.  He is able to engage in this discussion with daughter at bedside.  He tells me that he would not want intubation or CPR/ACLS medications if he were to experience a abrupt decline.  He is okay with the current scope of treatment.  Would be okay with BiPAP if indicated.    Patient's presentation is most consistent with acute presentation with potential threat to life or bodily function.   Disposition: admit  ____________________________________________  FINAL CLINICAL IMPRESSION(S) / ED DIAGNOSES  Final diagnoses:  SOB (shortness of breath)  COVID-19  Constipation, unspecified constipation type  Pneumonia of right upper lobe due to infectious organism    Note:  This document was prepared using Dragon voice recognition software and may include unintentional dictation  errors.  Nanda Quinton, MD, [facility] Emergency Medicine    Alfretta Pinch, [facility], MD 02/12/22 0005

## 2022-02-11 NOTE — ED Triage Notes (Signed)
Pt bib ems from [facility]. Per ems family states that he was c/o abdominal pain with foul smelling urine. Per family pt is altered. Per ems pt has rhonchi and was dx with pneumonia.

## 2022-02-11 NOTE — ED Notes (Signed)
Date and time results received: 02/11/22 2307 (use smartphrase ".now" to insert current time)  Test: lactic acid Critical Value: 2.0  Name of Provider Notified: Coralyn Helling, MD  Orders Received? Or Actions Taken?:  n/a

## 2022-02-11 NOTE — Progress Notes (Signed)
A consult was received from an ED physician for Vancomycin and Cefepime per pharmacy dosing.  The patient's profile has been reviewed for ht/wt/allergies/indication/available labs.    A one time order has been placed for Vancomycin '1750mg'$  IV and Cefepime 2gm IV x 1.    Further antibiotics/pharmacy consults should be ordered by admitting physician if indicated.                       Thank you, Everette Rank, PharmD 02/11/2022  10:04 PM

## 2022-02-12 ENCOUNTER — Observation Stay (HOSPITAL_COMMUNITY): Payer: Medicare PPO

## 2022-02-12 DIAGNOSIS — K5900 Constipation, unspecified: Secondary | ICD-10-CM | POA: Diagnosis present

## 2022-02-12 DIAGNOSIS — R338 Other retention of urine: Secondary | ICD-10-CM | POA: Diagnosis present

## 2022-02-12 DIAGNOSIS — U071 COVID-19: Secondary | ICD-10-CM | POA: Diagnosis not present

## 2022-02-12 DIAGNOSIS — J189 Pneumonia, unspecified organism: Secondary | ICD-10-CM | POA: Diagnosis present

## 2022-02-12 LAB — COMPREHENSIVE METABOLIC PANEL WITH GFR
ALT: 23 U/L (ref 0–44)
AST: 29 U/L (ref 15–41)
Albumin: 2.3 g/dL — ABNORMAL LOW (ref 3.5–5.0)
Alkaline Phosphatase: 109 U/L (ref 38–126)
Anion gap: 11 (ref 5–15)
BUN: 19 mg/dL (ref 8–23)
CO2: 26 mmol/L (ref 22–32)
Calcium: 10.6 mg/dL — ABNORMAL HIGH (ref 8.9–10.3)
Chloride: 96 mmol/L — ABNORMAL LOW (ref 98–111)
Creatinine, Ser: 0.77 mg/dL (ref 0.61–1.24)
GFR, Estimated: >60 mL/min
Glucose, Bld: 126 mg/dL — ABNORMAL HIGH (ref 70–99)
Potassium: 3.8 mmol/L (ref 3.5–5.1)
Sodium: 133 mmol/L — ABNORMAL LOW (ref 135–145)
Total Bilirubin: 0.4 mg/dL (ref 0.3–1.2)
Total Protein: 7.9 g/dL (ref 6.5–8.1)

## 2022-02-12 LAB — FOLATE: Folate: 11.7 ng/mL (ref >5.9)

## 2022-02-12 LAB — CBC
HCT: 23 % — ABNORMAL LOW (ref 39.0–52.0)
Hemoglobin: 7 g/dL — ABNORMAL LOW (ref 13.0–17.0)
MCH: 26.7 pg (ref 26.0–34.0)
MCHC: 30.4 g/dL (ref 30.0–36.0)
MCV: 87.8 fL (ref 80.0–100.0)
Platelets: 268 10*3/uL (ref 150–400)
RBC: 2.62 MIL/uL — ABNORMAL LOW (ref 4.22–5.81)
RDW: 15.6 % — ABNORMAL HIGH (ref 11.5–15.5)
WBC: 8.1 10*3/uL (ref 4.0–10.5)
nRBC: 0 % (ref 0.0–0.2)

## 2022-02-12 LAB — PHOSPHORUS
Phosphorus: 2.6 mg/dL (ref 2.5–4.6)
Phosphorus: 2.8 mg/dL (ref 2.5–4.6)

## 2022-02-12 LAB — PREALBUMIN: Prealbumin: 6 mg/dL — ABNORMAL LOW (ref 18–38)

## 2022-02-12 LAB — IRON AND TIBC
Iron: 15 ug/dL — ABNORMAL LOW (ref 45–182)
Saturation Ratios: 8 % — ABNORMAL LOW (ref 17.9–39.5)
TIBC: 189 ug/dL — ABNORMAL LOW (ref 250–450)
UIBC: 174 ug/dL

## 2022-02-12 LAB — OSMOLALITY: Osmolality: 291 mOsm/kg (ref 275–295)

## 2022-02-12 LAB — RETICULOCYTES
Immature Retic Fract: 13.8 % (ref 2.3–15.9)
RBC.: 2.66 MIL/uL — ABNORMAL LOW (ref 4.22–5.81)
Retic Count, Absolute: 46.3 10*3/uL (ref 19.0–186.0)
Retic Ct Pct: 1.7 % (ref 0.4–3.1)

## 2022-02-12 LAB — URINALYSIS, ROUTINE W REFLEX MICROSCOPIC
Bacteria, UA: NONE SEEN
Bilirubin Urine: NEGATIVE
Glucose, UA: NEGATIVE mg/dL
Hgb urine dipstick: NEGATIVE
Ketones, ur: NEGATIVE mg/dL
Leukocytes,Ua: NEGATIVE
Nitrite: NEGATIVE
Protein, ur: NEGATIVE mg/dL
Specific Gravity, Urine: 1.028 (ref 1.005–1.030)
pH: 6 (ref 5.0–8.0)

## 2022-02-12 LAB — BLOOD GAS, VENOUS
Acid-base deficit: 1.1 mmol/L (ref 0.0–2.0)
Bicarbonate: 22.4 mmol/L (ref 20.0–28.0)
O2 Saturation: 79.5 %
Patient temperature: 37
pCO2, Ven: 33 mmHg — ABNORMAL LOW (ref 44–60)
pH, Ven: 7.44 — ABNORMAL HIGH (ref 7.25–7.43)
pO2, Ven: 47 mmHg — ABNORMAL HIGH (ref 32–45)

## 2022-02-12 LAB — MAGNESIUM
Magnesium: 1.6 mg/dL — ABNORMAL LOW (ref 1.7–2.4)
Magnesium: 1.8 mg/dL (ref 1.7–2.4)

## 2022-02-12 LAB — OSMOLALITY, URINE: Osmolality, Ur: 577 mOsm/kg (ref 300–900)

## 2022-02-12 LAB — TSH: TSH: 1.235 u[IU]/mL (ref 0.350–4.500)

## 2022-02-12 LAB — FERRITIN: Ferritin: 414 ng/mL — ABNORMAL HIGH (ref 24–336)

## 2022-02-12 LAB — LACTIC ACID, PLASMA
Lactic Acid, Venous: 1.7 mmol/L (ref 0.5–1.9)
Lactic Acid, Venous: 1.7 mmol/L (ref 0.5–1.9)

## 2022-02-12 LAB — CREATININE, URINE, RANDOM: Creatinine, Urine: 105 mg/dL

## 2022-02-12 LAB — SODIUM, URINE, RANDOM: Sodium, Ur: 41 mmol/L

## 2022-02-12 LAB — VITAMIN B12: Vitamin B-12: 590 pg/mL (ref 180–914)

## 2022-02-12 LAB — CK: Total CK: 21 U/L — ABNORMAL LOW (ref 49–397)

## 2022-02-12 LAB — PROCALCITONIN: Procalcitonin: 0.11 ng/mL

## 2022-02-12 MED ORDER — SODIUM CHLORIDE 0.9 % IV SOLN: INTRAVENOUS | Status: AC

## 2022-02-12 MED ORDER — PROSOURCE TF20 ENFIT COMPATIBL EN LIQD
60.0000 mL | Freq: Every day | ENTERAL | Status: DC
  Filled 2022-02-12 (×2): qty 60

## 2022-02-12 MED ORDER — HYDROCODONE-ACETAMINOPHEN 5-325 MG PO TABS: 1.0000 | ORAL_TABLET | ORAL | Status: DC | PRN

## 2022-02-12 MED ORDER — ZOLEDRONIC ACID 4 MG/100ML IV SOLN
4.0000 mg | Freq: Once | INTRAVENOUS | Status: AC
  Administered 2022-02-12: 4 mg via INTRAVENOUS
  Filled 2022-02-12: qty 100

## 2022-02-12 MED ORDER — BISACODYL 10 MG RE SUPP
10.0000 mg | Freq: Every day | RECTAL | Status: DC | PRN
  Administered 2022-02-12: 10 mg via RECTAL
  Filled 2022-02-12: qty 1

## 2022-02-12 MED ORDER — MILK AND MOLASSES ENEMA
1.0000 | Freq: Once | RECTAL | Status: DC
  Filled 2022-02-12: qty 240

## 2022-02-12 MED ORDER — FLEET ENEMA 7-19 GM/118ML RE ENEM
1.0000 | ENEMA | Freq: Once | RECTAL | Status: AC
  Administered 2022-02-12: 1 via RECTAL
  Filled 2022-02-12: qty 1

## 2022-02-12 MED ORDER — ACETAMINOPHEN 650 MG RE SUPP: 650.0000 mg | Freq: Four times a day (QID) | RECTAL | Status: DC | PRN

## 2022-02-12 MED ORDER — IPRATROPIUM-ALBUTEROL 0.5-2.5 (3) MG/3ML IN SOLN
3.0000 mL | Freq: Three times a day (TID) | RESPIRATORY_TRACT | Status: DC
  Administered 2022-02-12: 3 mL via RESPIRATORY_TRACT
  Filled 2022-02-12: qty 3

## 2022-02-12 MED ORDER — SODIUM CHLORIDE 0.9 % IV SOLN
2.0000 g | Freq: Three times a day (TID) | INTRAVENOUS | Status: DC
  Administered 2022-02-12 – 2022-02-13 (×5): 2 g via INTRAVENOUS
  Filled 2022-02-12 (×5): qty 12.5

## 2022-02-12 MED ORDER — SENNA 8.6 MG PO TABS: 1.0000 | ORAL_TABLET | Freq: Two times a day (BID) | ORAL | Status: DC

## 2022-02-12 MED ORDER — ZOLEDRONIC ACID 4 MG/5ML IV CONC
4.0000 mg | Freq: Once | INTRAVENOUS | Status: DC
  Filled 2022-02-12: qty 5

## 2022-02-12 MED ORDER — ACETAMINOPHEN 325 MG PO TABS: 650.0000 mg | ORAL_TABLET | Freq: Four times a day (QID) | ORAL | Status: DC | PRN

## 2022-02-12 MED ORDER — POLYETHYLENE GLYCOL 3350 17 G PO PACK: 17.0000 g | PACK | Freq: Every day | ORAL | Status: DC | PRN

## 2022-02-12 MED ORDER — VANCOMYCIN HCL 1250 MG/250ML IV SOLN
1250.0000 mg | Freq: Two times a day (BID) | INTRAVENOUS | Status: DC
  Administered 2022-02-12: 1250 mg via INTRAVENOUS
  Filled 2022-02-12 (×2): qty 250

## 2022-02-12 MED ORDER — METRONIDAZOLE 500 MG/100ML IV SOLN
500.0000 mg | Freq: Two times a day (BID) | INTRAVENOUS | Status: DC
  Administered 2022-02-12 – 2022-02-13 (×3): 500 mg via INTRAVENOUS
  Filled 2022-02-12 (×4): qty 100

## 2022-02-12 MED ORDER — DOCUSATE SODIUM 100 MG PO CAPS: 100.0000 mg | ORAL_CAPSULE | Freq: Two times a day (BID) | ORAL | Status: DC

## 2022-02-12 MED ORDER — FENTANYL CITRATE PF 50 MCG/ML IJ SOSY: 25.0000 ug | PREFILLED_SYRINGE | INTRAMUSCULAR | Status: DC | PRN

## 2022-02-12 MED ORDER — SENNOSIDES-DOCUSATE SODIUM 8.6-50 MG PO TABS
1.0000 | ORAL_TABLET | Freq: Two times a day (BID) | ORAL | Status: DC
  Administered 2022-02-13 – 2022-02-14 (×3): 1
  Filled 2022-02-12 (×3): qty 1

## 2022-02-12 MED ORDER — SODIUM CHLORIDE 0.9% FLUSH
10.0000 mL | Freq: Two times a day (BID) | INTRAVENOUS | Status: DC
  Administered 2022-02-12 – 2022-02-15 (×5): 10 mL

## 2022-02-12 MED ORDER — POLYETHYLENE GLYCOL 3350 17 G PO PACK
17.0000 g | PACK | Freq: Every day | ORAL | Status: DC
  Administered 2022-02-13 – 2022-02-14 (×2): 17 g
  Filled 2022-02-12 (×2): qty 1

## 2022-02-12 MED ORDER — CHLORHEXIDINE GLUCONATE CLOTH 2 % EX PADS
6.0000 | MEDICATED_PAD | Freq: Every day | CUTANEOUS | Status: DC
  Administered 2022-02-13 – 2022-02-14 (×2): 6 via TOPICAL

## 2022-02-12 MED ORDER — OSMOLITE 1.5 CAL PO LIQD
1000.0000 mL | ORAL | Status: DC
  Filled 2022-02-12: qty 1000

## 2022-02-12 MED ORDER — HYDROCODONE-ACETAMINOPHEN 5-325 MG PO TABS
1.0000 | ORAL_TABLET | ORAL | Status: DC | PRN
  Filled 2022-02-12: qty 1

## 2022-02-12 MED ORDER — SODIUM CHLORIDE 0.9% FLUSH: 10.0000 mL | INTRAVENOUS | Status: DC | PRN

## 2022-02-12 MED ORDER — IPRATROPIUM-ALBUTEROL 0.5-2.5 (3) MG/3ML IN SOLN
3.0000 mL | Freq: Two times a day (BID) | RESPIRATORY_TRACT | Status: DC
  Administered 2022-02-12: 3 mL via RESPIRATORY_TRACT
  Filled 2022-02-12: qty 3

## 2022-02-12 MED ORDER — FREE WATER: 150.0000 mL | Freq: Four times a day (QID) | Status: DC

## 2022-02-12 NOTE — Assessment & Plan Note (Signed)
Order bowel regimen

## 2022-02-12 NOTE — Progress Notes (Signed)
Pharmacy Antibiotic Note  Leslie Livingston is a 72 y.o. male admitted on 02/11/2022 with infection of unknown source.  Recent hospitalization in Dec for pneumonia.  Patient has received Cefepime 2gm and Vancomycin '1750mg'$  IV x 1 dose each in the ED.  Pharmacy has been consulted for Vancomycin and Cefepime dosing.  Plan: Cefepime 2gm IV q8h Vancomycin 1250 mg IV Q 12 hrs. Goal AUC 400-550. Expected AUC: 496.9  SCr used: 0.81 Metronidazole per MD Follow renal function  Height: '6\' 1"'$  (185.4 cm) Weight: 85.5 kg (188 lb 7.9 oz) IBW/kg (Calculated) : 79.9  Temp (24hrs), Avg:99.9 F (37.7 C), Min:99.9 F (37.7 C), Max:99.9 F (37.7 C)  Recent Labs  Lab 02/11/22 2037 02/11/22 2200  WBC 10.5  --   CREATININE 0.81  --   LATICACIDVEN 1.8 2.0*    Estimated Creatinine Clearance: 93.2 mL/min (by C-G formula based on SCr of 0.81 mg/dL).    No Known Allergies  Antimicrobials this admission: 1/18 Cefepime >>   1/18 Vancomycin >>   1/19 Metronidazole >>  Dose adjustments this admission:    Microbiology results: 1/18 BCx:   1/19 UCx:   1/18 Covid: +     Thank you for allowing pharmacy to be a part of this patient's care.  Everette Rank, PharmD 02/12/2022 12:48 AM

## 2022-02-12 NOTE — Assessment & Plan Note (Signed)
-  SIRS criteria met with     tachycardia     RR >20 Today's Vitals   02/11/22 2209 02/11/22 2215 02/11/22 2230 02/11/22 2330  BP: 121/75 111/69 113/66 122/66  Pulse: (!) 107 (!) 103 (!) 102 (!) 106  Resp: (!) 31 (!) 27 (!) 25 (!) 28  Temp:      TempSrc:      SpO2: 95% 96% 97% 95%  Weight:      Height:      PainSc:         The recent clinical data is shown below. Vitals:   02/11/22 2209 02/11/22 2215 02/11/22 2230 02/11/22 2330  BP: 121/75 111/69 113/66 122/66  Pulse: (!) 107 (!) 103 (!) 102 (!) 106  Resp: (!) 31 (!) 27 (!) 25 (!) 28  Temp:      TempSrc:      SpO2: 95% 96% 97% 95%  Weight:      Height:          -Most likely source being:  pulmonary, vs viral    Source of sepsis is unknown but given clinical picture will continue to treat   Patient meeting criteria for Severe sepsis with    evidence of end organ damage/organ dysfunction such as      elevated lactic acid >2     Component Value Date/Time   LATICACIDVEN 2.0 ([place]) 02/11/2022 6803    acute metabolic encephalopathy          - Obtain serial lactic acid and procalcitonin level.  - Initiated IV antibiotics in ER: Antibiotics Given (last 72 hours)     Date/Time Action Medication Dose Rate   02/11/22 2220 New Bag/Given   ceFEPIme (MAXIPIME) 2 g in sodium chloride 0.9 % 100 mL IVPB 2 g 200 mL/hr   02/11/22 2322 New Bag/Given   vancomycin (VANCOREADY) IVPB 1750 mg/350 mL 1,750 mg 175 mL/hr       Will continue  on : cefepime. Vanc flagyl   - await results of blood and urine culture  - Rehydrate aggressively  Intravenous fluids were administered,     12:10 AM

## 2022-02-12 NOTE — Assessment & Plan Note (Signed)
Allow permissive HTN 

## 2022-02-12 NOTE — ED Notes (Signed)
While attempting to get bloodwork, pt attempted to grab and hit this RN.

## 2022-02-12 NOTE — Assessment & Plan Note (Signed)
Will notify dr. Benay Spice that pt has been admitted will need goals of care discussion

## 2022-02-12 NOTE — Assessment & Plan Note (Signed)
After I/o 1200 ml of urine obtained Continue to monitor for urinary retention check post void residual and if >300 place Foley

## 2022-02-12 NOTE — ED Notes (Signed)
Pt was lying on back in front of the door during shift change. While receiving report, Dr. Benay Spice entered the room and noticed the pt lying on the floor with feces on his back and buttocks area. Pt stated "I was trying to go use the bathroom". Pt is confused/ AMS. Pt normally wears a brief and does not toilet independently. Pt was observed as a high fall risk prior to the incident. Pt was put back into bed and cleaned by staff. Pt suffers no injuries due to the fall.

## 2022-02-12 NOTE — Progress Notes (Signed)
Manufacturing engineer [facility]) Hospital Liaison Note  Referral received for patient/family interest in hospice at home. Chart under review by [facility] physician.   Hospice eligibility pending   Plan is to discharge home today.   No immediate DME needs at this time.   Please send patient home with comfort medications/prescription at discharge.   Please call with any questions or concerns. Thank you  Roselee Nova, [place] [facility] Liaison [phone]

## 2022-02-12 NOTE — Progress Notes (Signed)
Port re-accessed due to needle being removed by patient. Site unremarkable. Please consider mittens to protect central line.

## 2022-02-12 NOTE — ED Notes (Signed)
During shif

## 2022-02-12 NOTE — ED Notes (Signed)
Placed new brief on pt.

## 2022-02-12 NOTE — Progress Notes (Addendum)
IP PROGRESS NOTE  Subjective:   Leslie Livingston presented to the emergency room last night with abdominal pain and abnormal lung sounds he was diagnosed with COVID-19 in December.  A COVID-19 test was positive in the emergency room yesterday.  Leslie Livingston is well-known to me with a history of metastatic esophagus cancer, currently maintained off of specific therapy.  He was last treated with FOLFOX/nivolumab on 12/22/2021.  His daughter is not present this morning.  Objective: Vital signs in last 24 hours: Blood pressure 103/66, pulse 93, temperature 98.3 F (36.8 C), temperature source Oral, resp. rate (!) 29, height '6\' 1"'$  (1.854 m), weight 188 lb 7.9 oz (85.5 kg), SpO2 95 %.  Intake/Output from previous day: No intake/output data recorded.  Physical Exam: Leslie Livingston was found on the floor of the examination room lying in stool. HEENT: Dried secretions over the tongue and palate, no thrush Lungs: Moves air bilaterally, no respiratory distress Cardiac: Regular rate and rhythm Abdomen: Soft and nontender, left upper quadrant feeding tube Extremities: No leg edema Neurologic: Alert, not oriented, knows my name, follows simple commands, moves extremities to command  Portacath/PICC-without erythema  Lab Results: Recent Labs    02/11/22 2037 02/12/22 0530  WBC 10.5 8.1  HGB 7.8* 7.0*  HCT 25.4* 23.0*  PLT 307 268    BMET Recent Labs    02/11/22 2037  NA 131*  K 3.7  CL 97*  CO2 27  GLUCOSE 140*  BUN 19  CREATININE 0.81  CALCIUM 10.9*    No results found for: "CEA1", "CEA", "JKK938", "CA125"  Studies/Results: CT CHEST ABDOMEN PELVIS W CONTRAST  Result Date: 02/11/2022 CLINICAL DATA:  Abdominal pain, altered level of consciousness, pneumonia, sepsis, esophageal cancer. EXAM: CT CHEST, ABDOMEN, AND PELVIS WITH CONTRAST TECHNIQUE: Multidetector CT imaging of the chest, abdomen and pelvis was performed following the standard protocol during bolus administration of  intravenous contrast. RADIATION DOSE REDUCTION: This exam was performed according to the departmental dose-optimization program which includes automated exposure control, adjustment of the mA and/or kV according to patient size and/or use of iterative reconstruction technique. CONTRAST:  156m OMNIPAQUE IOHEXOL 300 MG/ML  SOLN COMPARISON:  02/11/2022, 01/07/2022. FINDINGS: CT CHEST FINDINGS Cardiovascular: The heart is unremarkable without pericardial effusion. No evidence of thoracic aortic aneurysm or dissection. Atherosclerosis of the aorta and coronary vasculature. Stable left chest wall port. Mediastinum/Nodes: Progressive circumferential wall thickening of the distal thoracic esophagus measuring up to 2.9 cm in diameter, compatible with progressive esophageal cancer. Thyroid and trachea are unremarkable. Progressive left supraclavicular lymphadenopathy, largest lymph node now measuring up to 3 cm in short axis, previously having measured 1.2 cm. Continued mediastinal and hilar adenopathy. Index lymph node right paratracheal region measures 1 cm, stable. Lungs/Pleura: Interval progression of the known pulmonary metastases. Index lesion within the right upper lobe now measures 3.4 x 3.5 cm reference image 39/6, previously measuring 2.2 x 1.5 cm. Masslike consolidation within the left upper and right lower lobes again noted. There is slight improved aeration within the right lung base. There are small bilateral pleural effusions which have developed since prior study, left greater than right. No pneumothorax. Musculoskeletal: Interval progression of metastatic lesion within the left posterior tenth rib, with increased cortical destruction and associated soft tissue mass. Reconstructed images demonstrate no additional findings. CT ABDOMEN PELVIS FINDINGS Hepatobiliary: No focal liver abnormality is seen. No gallstones, gallbladder wall thickening, or biliary dilatation. Pancreas: Unremarkable. No pancreatic ductal  dilatation or surrounding inflammatory changes. Spleen: Normal in size without  focal abnormality. Adrenals/Urinary Tract: Adrenal glands are unremarkable. Kidneys are normal, without renal calculi, focal lesion, or hydronephrosis. Bladder is unremarkable. Stomach/Bowel: No bowel obstruction or ileus. Normal appendix right lower quadrant. Large amount of retained stool throughout the colon, most pronounced within the rectal vault consistent with constipation and fecal impaction. No bowel wall thickening or inflammatory change. Percutaneous gastrostomy tube within the gastric lumen. The stomach is markedly distended. Vascular/Lymphatic: Atherosclerosis of the aorta and its branches. Enlarged lymph node within the gastrohepatic ligament measuring 1.4 cm image 60/2. No other discrete adenopathy within the abdomen or pelvis. Reproductive: Prostate is unremarkable. Other: No free fluid or free intraperitoneal gas. No abdominal wall hernia. Musculoskeletal: There are no acute or destructive bony lesions. There is bilateral hip osteoarthritis, right greater than left. Reconstructed images demonstrate no additional findings. IMPRESSION: 1. Progressive circumferential wall thickening of the distal thoracic esophagus consistent with known esophageal cancer. 2. Worsening metastatic disease, with progressive lymphadenopathy, new and enlarging bilateral pulmonary metastases, and progressive bony metastatic disease within the left posterior tenth rib. 3. Continued areas of masslike consolidation within the left upper and right lower lobes. The right lower lobe consolidation is improved since prior study, and may reflect resolving pneumonia superimposed upon metastatic disease. 4. Enlarged lymph node within the gastrohepatic ligament concerning for subdiaphragmatic metastatic disease. 5. Marked gastric distension. Percutaneous gastrostomy tube is seen within the gastric lumen. 6. Severe fecal retention throughout the colon, with a  large amount of stool in the rectal vault consistent with fecal impaction. No bowel obstruction. 7.  Aortic Atherosclerosis (ICD10-I70.0). Electronically Signed   By: Randa Ngo M.D.   On: 02/11/2022 23:25   DG Chest Port 1 View  Result Date: 02/11/2022 CLINICAL DATA:  [id number] Sepsis [facility]) [id number] EXAM: PORTABLE CHEST 1 VIEW COMPARISON:  Chest x-ray 01/11/2022, CT angio chest 01/07/2022 FINDINGS: Left chest wall Port-A-Cath with tip overlying the superior cavoatrial junction. The heart and mediastinal contours are within normal limits. Slightly more conspicuous 2.8 cm round right mid to lower lung zone airspace opacity as well as left upper lobe airspace opacity. No pulmonary edema. No pleural effusion. No pneumothorax. No acute osseous abnormality. IMPRESSION: Slightly more conspicuous 2.8 cm round right mid to lower lung zone airspace opacity as well as left upper lobe airspace opacity. Finding could be combination of infection/inflammation versus malignancy. Recommend CT chest with intravenous contrast for further evaluation. Electronically Signed   By: Iven Finn M.D.   On: 02/11/2022 21:50    Medications: I have reviewed the patient's current medications.  Assessment/plan: Metastatic esophageal cancer CT neck 07/29/2021-adenopathy at the thoracic inlet and lower bilateral jugular chains, esophagus mass, left vocal cord paralysis CT chest 07/29/2021-diffuse esophageal wall thickening, most pronounced in the proximal esophagus, multiple pulmonary nodules, mediastinal and supraclavicular lymphadenopathy Upper endoscopy 08/07/2021-mass/stricture extending from the UES to 30 cm CTs chest, abdomen, and pelvis 08/07/2021-long segment wall thickening of the esophagus, prominent proximally, multiple large supraclavicular mediastinal nodes, bilateral lung nodules,, and upper abdominal nodes similar to remote abdominal CT Biopsy 08/07/2021 showed invasive moderately differentiated squamous cell  carcinoma Foundation 1-MSS, tumor mutation burden 7, PD-L1 CPS 10 Esophagus radiation 08/13/2021 - 09/03/2021 Cycle 1 FOLFOX/nivolumab 09/08/2021 Cycle 2 held 09/22/2021 due to neutropenia Cycle 2 FOLFOX/nivolumab 09/29/2021, Udenyca Cycle 3 FOLFOX/nivolumab 10/13/2021, Uden[facility]ca Cycle 4 FOLFOX/nivolumab 10/27/2021, Udenyca Cycle 5 FOLFOX/nivolumab 11/10/2021, Udenyca CTs 11/17/2021-unchanged long segment ill-defined circumferential wall thickening of the esophagus; multiple bilateral cavitary pulmonary nodules are slightly diminished in size; increase in size of a nodule  in the perihilar posterior left upper lobe with associated postobstructive airspace disease; unchanged enlarged high right paratracheal and low right paraesophageal lymph node; interval sclerosis of multiple osseous metastatic lesions Cycle 6 FOLFOX/nivolumab 11/24/2021, Uden[facility]ca Cycle 7 FOLFOX/nivolumab 12/08/2021, Udenyca Cycle 8 FOLFOX/nivolumab 12/22/2021, Udenyca CT chest 01/07/2022-negative for pulmonary embolism, dense consolidation in the right lower lobe with airspace disease in the left upper lobe consistent with pneumonia, mild progression of mediastinal and right hilar adenopathy, stable lung nodules with enlargement of a right upper lobe nodule, suspected left supraclavicular lymph node CTs 02/11/2022-progressive circumferential wall thickening at the distal esophagus, progressive metastatic lymphadenopathy and enlarging bilateral pulmonary metastases, progressive bone metastasis at the left posterior 10th rib, improved right lower lobe consolidation, fecal retention   Solid/liquid dysphagia secondary to #1 -Gastrostomy tube placement 07/31/2021 Weight loss secondary to #1 Odynophagia secondary to #1 History of colon polyps-tubular adenomas History of a diverticular bleed-April 2020 Anemia Thrombocytosis Left subclavian Port-A-Cath placement 08/10/2021 Left upper extremity edema -Doppler ultrasound of left upper extremity  08/13/2021-negative for DVT 11.  Oxaliplatin neuropathy-mild/moderate loss of vibratory sense 12/08/2021, 12/22/2021 12.  Admission 01/07/2022 with dyspnea-CT chest consistent with pneumonia 13.  COVID-12 January 2022 14.  Admission 02/11/2022 with failure to thrive, abdominal pain, COVID-19 positive, CT with progressive metastatic esophagus cancer, severe anemia and hypercalcemia  Leslie Livingston has metastatic esophagus cancer.  He is admitted with failure to thrive and abdominal pain.  Abdominal pain may have been related to constipation.  He appears to have had a large bowel movement this morning.  The admission CT is consistent with progressive metastatic disease.  Leslie Livingston has been off of treatment for esophagus cancer for over a month.  He does not appear to be a candidate for further systemic therapy.  I recommend hospice care.  He is confused this morning, likely related to hypercalcemia and delirium from critical illness.  I recommend treating the hypercalcemia with intravenous hydration and Zometa.  This may allow for improvement in his mental status so he can participate in discussion regarding prognosis and hospice care  I discussed the case with his daughter by telephone.  She understands the poor prognosis and agrees with hospice care.  She plans to take him home with hospice.  She has arranged for help at home.  Recommendations: Access Port-A-Cath, continue intravenous hydration Zometa and intravenous hydration for the hypercalcemia, repeat chemistry panel 02/13/2022 Palliative care consult I recommend hospice care Please call Oncology as needed, I will check on him 02/15/2022 if he remains in the hospital   LOS: 0 days   Betsy Coder, MD   02/12/2022, 6:45 AM

## 2022-02-12 NOTE — Assessment & Plan Note (Signed)
Order anemia panel  Transfuse as needed  for hg < 7.5

## 2022-02-12 NOTE — Assessment & Plan Note (Signed)
For now broad spectrum ABX for today  Await result of sputum culture  Procalcitionin Possibly post vIRAr PNA vs post obstructive

## 2022-02-12 NOTE — Care Plan (Signed)
Patient awake and aware, reports abdominal discomfort but not painful at this time,  discuss feeding and enema for relief of constipation, patient accepts enema but refuses any connection to gtube until stomach stops hurting and constipation resolves. Offer miralax as ordered, patient refuses.  Primafit placed for urine, patient allows for comfort.  Unable to urinate on request, states "I will go later".   Enema given with patient able to turn easily,  some leakage but some retention of material.  Iv infusing, port recently accessed but not in use at this time

## 2022-02-12 NOTE — Care Management (Addendum)
Transition of Care [facility]) - Emergency Department Mini Assessment   Patient Details  Name: Leslie Livingston MRN: [id number] Date of Birth: [birth year]  Transition of Care [facility]) CM/SW Contact:    Roseanne Kaufman, RN Phone Number: 02/12/2022, 1:39 PM   Clinical Narrative: This RNCM received TOC consult for home hospice services. Per chart review patient is altered mental status.This RNCM spoke with patient's daughter [name] to offer home hospice choice. Patient's daughter [name] chose [facility] for home hospice. Patient's daughter [name] reports patient has a hospital bed and unsure of what DME will be needed by [facility].  This RNCM spoke with Clarise Cruz with [facility] regarding home hospice referral. This RNCM will await response from [place].   This RNCM will continue to follow. - 2:30pm This RNCM notified Clarise Cruz who reports Shanita with [facility] will be following the patient. Per Fabio Pierce she is reviewing and will contact this RNCM once completed.   TOC will continue to follow.  -2:58 pm This RNCM received secure chat from [place] who confirmed [facility] will follow this patient for home hospice.  Notified EDP and RN.  No additional TOC needs at this time.  - 3:29pm This RNCM spoke with patient's daughter [name] who reports she needs the following DME: walker, 3N1, and feeding pump. Patient's daughter [name] reports prior to admission the patient has the following DME at home: hospital bed, shower chair, wheelchair w/cushion, hospital bed.  Notified MD, Fabio Pierce with [facility]. Patient's daughter reports she will be available to pick patient up once DME is set up and/or after 7pm today or tomorrow in the am.  Per chart review patient will transition to the floor.   TOC will continue to follow  -5:54pm This RNCM spoke with [facility] to determine patient nutrition status prior to admission. Spoke with Ms. Broadus John who reports she does not have access to patient's chart  however a folder with all care needs was sent with EMS. Patient was at [facility] for short term SNF.   TOC will continue to follow.  ED Mini Assessment:   Patient has altered mental status with history of esophageal cancer.           Interventions which prevented an admission or readmission: Hospice    Patient Contact and Communications     Spoke with: Daughter: [name] Contact Date: 02/12/22,   Contact time: 1337      Patient states their goals for this hospitalization and ongoing recovery are:: home hospice CMS Medicare.gov Compare Post Acute Care list provided to:: Patient Represenative (must comment) [name] (daughter)) Choice offered to / list presented to : Adult Children  Admission diagnosis:  COVID-19 virus infection [U07.1] Patient Active Problem List   Diagnosis Date Noted   HCAP (healthcare-associated pneumonia) 02/12/2022   Obstipation 02/12/2022   Acute urinary retention 02/12/2022   COVID-19 virus infection 02/11/2022   Generalized weakness 01/11/2022   Sepsis ([place]) 01/07/2022   Malnutrition of moderate degree 08/11/2021   Cancer of proximal third of esophagus ([place]) 08/11/2021   Dysphagia 08/11/2021   Squamous cell carcinoma, 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[place]) 08/11/2021   Esophageal mass 08/07/2021   Iron deficiency anemia due to chronic blood loss 05/05/2018   Dyslipidemia 05/05/2018   Obesity (BMI 30-39.9) 05/05/2018   GERD (gastroesophageal reflux disease) 05/05/2018   Acute blood loss anemia 05/05/2018   Diverticulosis of colon with hemorrhage    Rectal bleed 05/04/2018   GI bleed 05/02/2018   ERECTILE DYSFUNCTION 06/06/2007   ANEMIA-NOS 12/16/2006   Essential  hypertension 12/16/2006   HEMORRHOIDS, EXTERNAL 12/16/2006   PCP:  Lorene Dy, MD Pharmacy:   [address] [address] Phone: [phone] Fax: [place] [address] Phone: [phone] Fax: [phone]

## 2022-02-12 NOTE — Progress Notes (Signed)
PROGRESS NOTE    Leslie Livingston  UKG:[id number] DOB: 19-Jul-1950 DOA: 02/11/2022 PCP: Lorene Dy, MD     Brief Narrative:   metastatic esophageal cancer on tube feedings via PEG tube, failure to thrive,  Presents with abdominal pain and foul-smelling urine patient has been confused   Subjective:  He appear calm , currently he denies pain, he is watching TV He is not oriented to time or place, not able to provide reliable history  Assessment & Plan:  Principal Problem:   COVID-19 virus infection Active Problems:   ANEMIA-NOS   Essential hypertension   Esophageal mass   Malnutrition of moderate degree   Sepsis ([place])   HCAP (healthcare-associated pneumonia)   Obstipation   Acute urinary retention    Assessment and Plan:   Acute metabolic encephalopathy -Likely multifactorial including hypercalcemia, urinary retention, severe constipation -Not sure he has infection, there is no fever, no leukocytosis  -blood culture no growth, I can not locate UA or urine culture result, he  already received  abx  Hypocalcemia, Treated with Zometa x 1 1/19 Received hydration  Hyponatremia Improved with hydration Resume tube feeds with free water flushes  Sever constipation Seen on CT scan Fleet enema Senokot and MiraLAX per tube  Acute urinary retention Per record,After I/o 1200 ml of urine obtained  I do not see a Foley in place Continue bladder scan,   Normocytic anemia in the setting of malignancy  metastatic esophageal cancer on tube feedings via PEG tube, failure to thrive CT on admission showed disease progression Seen by oncology Dr. Benay Spice  who recommend hospice, family in agreement, working on getting equipment delivered so he will go home with home hospice tomorrow    Positive COVID test Does not appear to have respiratory symptoms, he is on room air Family states that pt had covid last month  .    I have Reviewed nursing notes, Vitals, pain  scores, I/o's, Lab results and  imaging results since pt's last encounter, details please see discussion above       DVT prophylaxis: SCDs Start: 02/12/22 0349   Code Status:   Code Status: DNR  Family Communication: Daughter over the phone Disposition:   Status is: Observation  Dispo: The patient is from: Home              Anticipated d/c is to: Home hospice on 1/20 once equipment delivered                 Objective: Vitals:   02/12/22 0949 02/12/22 1039 02/12/22 1403 02/12/22 1406  BP:  109/68 116/66   Pulse:  94 98   Resp:  (!) 24 18   Temp: 98.7 F (37.1 C)  98.6 F (37 C)   TempSrc: Oral  Oral   SpO2:  100% 99%   Weight:    86.9 kg  Height:        Intake/Output Summary (Last 24 hours) at 02/12/2022 1815 Last data filed at 02/12/2022 1800 Gross per 24 hour  Intake 1450 ml  Output 250 ml  Net 1200 ml   Filed Weights   02/11/22 2025 02/12/22 1406  Weight: 85.5 kg 86.9 kg    Examination:  General exam: alert, awake, communicative,calm, NAD, only oriented to person Respiratory system: Respiratory effort normal, on room air Cardiovascular system:  RRR.  Gastrointestinal system: Abdomen is nondistended, soft and nontender.  Normal bowel sounds heard, + peg tube Central nervous system: Alert and oriented to person,.  Extremities:  no edema Skin: No rashes, lesions or ulcers Psychiatry: Calm and cooperative    Data Reviewed: I have personally reviewed  labs and visualized  imaging studies since the last encounter and formulate the plan        Scheduled Meds:  Chlorhexidine Gluconate Cloth  6 each Topical Daily   docusate sodium  100 mg Oral BID   ipratropium-albuterol  3 mL Nebulization BID   senna  1 tablet Oral BID   sodium chloride flush  10-40 mL Intracatheter Q12H   Continuous Infusions:  ceFEPime (MAXIPIME) IV 2 g (02/12/22 1604)   metronidazole 500 mg (02/12/22 1425)   vancomycin 1,250 mg (02/12/22 1502)     LOS: 0 days   Time  spent:  78mns  FFlorencia Reasons MD PhD FACP [facility]  Available via Epic secure chat 7am-7pm for nonurgent issues Please page for urgent issues To page the attending provider between 7A-7P or the covering provider during after hours 7P-7A, please log into the web site [email or web address] and access using universal [place] password for that web site. If you do not have the password, please call the hospital operator.    02/12/2022, 6:15 PM

## 2022-02-12 NOTE — ED Notes (Signed)
Patient was found on the floor, RN notified and doctor was at the bedside. We then cleaned the patient up after getting him back into bed.

## 2022-02-12 NOTE — Progress Notes (Signed)
  Daily Progress Note   Patient Name: Leslie Livingston       Date: 02/12/2022 DOB: [birth year]  Age: 72 y.o. MRN#: [id number] Attending Physician: Florencia Reasons, MD Primary Care Physician: Lorene Dy, MD Admit Date: 02/11/2022 Length of Stay: 0 days  Discussed care with primary hospitalist today. Dr. Learta Codding, patient's oncologist, has already discussed care with patient's daughter (as patient delirious).  Daughter agreeing with patient returning home with hospice care. Hospitalist to consult TOC to assist with coordination of this care. As goals for medical care are currently determined, PMT consult will be canceled. Please reach out if our team can be of further assistance in the future. Thank you.   Chelsea Aus, DO Palliative Care Provider PMT # [phone]

## 2022-02-12 NOTE — Assessment & Plan Note (Signed)
Will check prealbumin nutritional consult in AM

## 2022-02-12 NOTE — Assessment & Plan Note (Signed)
Family states that pt had covid last month possibly residual  Supportive management

## 2022-02-13 DIAGNOSIS — U071 COVID-19: Secondary | ICD-10-CM | POA: Diagnosis present

## 2022-02-13 DIAGNOSIS — J189 Pneumonia, unspecified organism: Secondary | ICD-10-CM

## 2022-02-13 DIAGNOSIS — E7800 Pure hypercholesterolemia, unspecified: Secondary | ICD-10-CM | POA: Diagnosis present

## 2022-02-13 DIAGNOSIS — R0602 Shortness of breath: Secondary | ICD-10-CM | POA: Diagnosis not present

## 2022-02-13 DIAGNOSIS — R627 Adult failure to thrive: Secondary | ICD-10-CM | POA: Diagnosis present

## 2022-02-13 DIAGNOSIS — Z515 Encounter for palliative care: Secondary | ICD-10-CM | POA: Diagnosis not present

## 2022-02-13 DIAGNOSIS — Z95828 Presence of other vascular implants and grafts: Secondary | ICD-10-CM | POA: Diagnosis not present

## 2022-02-13 DIAGNOSIS — J3801 Paralysis of vocal cords and larynx, unilateral: Secondary | ICD-10-CM | POA: Diagnosis present

## 2022-02-13 DIAGNOSIS — E871 Hypo-osmolality and hyponatremia: Secondary | ICD-10-CM | POA: Diagnosis present

## 2022-02-13 DIAGNOSIS — Z66 Do not resuscitate: Secondary | ICD-10-CM

## 2022-02-13 DIAGNOSIS — C7951 Secondary malignant neoplasm of bone: Secondary | ICD-10-CM | POA: Diagnosis present

## 2022-02-13 DIAGNOSIS — E440 Moderate protein-calorie malnutrition: Secondary | ICD-10-CM | POA: Diagnosis present

## 2022-02-13 DIAGNOSIS — D630 Anemia in neoplastic disease: Secondary | ICD-10-CM | POA: Diagnosis present

## 2022-02-13 DIAGNOSIS — K2289 Other specified disease of esophagus: Secondary | ICD-10-CM

## 2022-02-13 DIAGNOSIS — R338 Other retention of urine: Secondary | ICD-10-CM | POA: Diagnosis not present

## 2022-02-13 DIAGNOSIS — C7801 Secondary malignant neoplasm of right lung: Secondary | ICD-10-CM | POA: Diagnosis present

## 2022-02-13 DIAGNOSIS — I10 Essential (primary) hypertension: Secondary | ICD-10-CM | POA: Diagnosis present

## 2022-02-13 DIAGNOSIS — C779 Secondary and unspecified malignant neoplasm of lymph node, unspecified: Secondary | ICD-10-CM | POA: Diagnosis present

## 2022-02-13 DIAGNOSIS — Z87891 Personal history of nicotine dependence: Secondary | ICD-10-CM | POA: Diagnosis not present

## 2022-02-13 DIAGNOSIS — D75839 Thrombocytosis, unspecified: Secondary | ICD-10-CM | POA: Diagnosis present

## 2022-02-13 DIAGNOSIS — Y95 Nosocomial condition: Secondary | ICD-10-CM | POA: Diagnosis present

## 2022-02-13 DIAGNOSIS — G9341 Metabolic encephalopathy: Secondary | ICD-10-CM | POA: Diagnosis present

## 2022-02-13 DIAGNOSIS — K5900 Constipation, unspecified: Secondary | ICD-10-CM

## 2022-02-13 DIAGNOSIS — F419 Anxiety disorder, unspecified: Secondary | ICD-10-CM | POA: Diagnosis present

## 2022-02-13 DIAGNOSIS — G620 Drug-induced polyneuropathy: Secondary | ICD-10-CM | POA: Diagnosis present

## 2022-02-13 DIAGNOSIS — C159 Malignant neoplasm of esophagus, unspecified: Secondary | ICD-10-CM | POA: Diagnosis present

## 2022-02-13 DIAGNOSIS — Z931 Gastrostomy status: Secondary | ICD-10-CM | POA: Diagnosis not present

## 2022-02-13 DIAGNOSIS — F05 Delirium due to known physiological condition: Secondary | ICD-10-CM | POA: Diagnosis present

## 2022-02-13 DIAGNOSIS — C7802 Secondary malignant neoplasm of left lung: Secondary | ICD-10-CM | POA: Diagnosis present

## 2022-02-13 DIAGNOSIS — Z7189 Other specified counseling: Secondary | ICD-10-CM

## 2022-02-13 MED ORDER — LORAZEPAM 0.5 MG PO TABS: 0.5000 mg | ORAL_TABLET | ORAL | Status: DC | PRN

## 2022-02-13 MED ORDER — MORPHINE SULFATE 10 MG/5ML PO SOLN: 2.5000 mg | ORAL | Status: DC | PRN

## 2022-02-13 MED ORDER — ONDANSETRON HCL 4 MG/2ML IJ SOLN: 4.0000 mg | Freq: Four times a day (QID) | INTRAMUSCULAR | Status: DC | PRN

## 2022-02-13 MED ORDER — MORPHINE SULFATE (PF) 2 MG/ML IV SOLN: 2.0000 mg | INTRAVENOUS | Status: DC | PRN

## 2022-02-13 MED ORDER — MORPHINE SULFATE (PF) 2 MG/ML IV SOLN
2.0000 mg | INTRAVENOUS | Status: DC | PRN
  Administered 2022-02-14 – 2022-02-15 (×5): 2 mg via INTRAVENOUS
  Filled 2022-02-13 (×5): qty 1

## 2022-02-13 MED ORDER — SORBITOL 70 % SOLN
300.0000 mL | TOPICAL_OIL | Freq: Once | ORAL | Status: AC
  Administered 2022-02-13: 300 mL via RECTAL
  Filled 2022-02-13: qty 90

## 2022-02-13 MED ORDER — ONDANSETRON 4 MG PO TBDP: 4.0000 mg | ORAL_TABLET | Freq: Four times a day (QID) | ORAL | Status: DC | PRN

## 2022-02-13 MED ORDER — OXYCODONE HCL 5 MG/5ML PO SOLN
5.0000 mg | ORAL | Status: DC | PRN
  Administered 2022-02-13 – 2022-02-15 (×5): 5 mg via ORAL
  Filled 2022-02-13 (×5): qty 5

## 2022-02-13 MED ORDER — GLYCOPYRROLATE 0.2 MG/ML IJ SOLN: 0.2000 mg | INTRAMUSCULAR | Status: DC | PRN

## 2022-02-13 MED ORDER — BIOTENE DRY MOUTH MT LIQD: 15.0000 mL | OROMUCOSAL | Status: DC | PRN

## 2022-02-13 MED ORDER — HALOPERIDOL 0.5 MG PO TABS: 0.5000 mg | ORAL_TABLET | ORAL | Status: DC | PRN

## 2022-02-13 MED ORDER — POLYVINYL ALCOHOL 1.4 % OP SOLN: 1.0000 [drp] | Freq: Four times a day (QID) | OPHTHALMIC | Status: DC | PRN

## 2022-02-13 MED ORDER — HALOPERIDOL LACTATE 2 MG/ML PO CONC: 0.5000 mg | ORAL | Status: DC | PRN

## 2022-02-13 MED ORDER — HALOPERIDOL LACTATE 5 MG/ML IJ SOLN: 0.5000 mg | INTRAMUSCULAR | Status: DC | PRN

## 2022-02-13 MED ORDER — GLYCOPYRROLATE 1 MG PO TABS: 1.0000 mg | ORAL_TABLET | ORAL | Status: DC | PRN

## 2022-02-13 MED ORDER — IPRATROPIUM-ALBUTEROL 0.5-2.5 (3) MG/3ML IN SOLN: 3.0000 mL | RESPIRATORY_TRACT | Status: DC | PRN

## 2022-02-13 NOTE — Progress Notes (Signed)
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 PEG tube, failure to thrive,  Presents with abdominal pain and foul-smelling urine patient has been confused   Subjective:  He appears is calm ,   reports less ab pain after having bm Bladder scan with 189cc urine  he is watching TV He is oriented to person but not  to time or place, not able to provide reliable history  Seen by palliative care, care focus transition to full comfort measures   HPOA [name] is at bedside   Assessment & Plan:  Principal Problem:   COVID-19 virus infection Active Problems:   ANEMIA-NOS   Essential hypertension   Esophageal mass   Malnutrition of moderate degree   Sepsis ([place])   HCAP (healthcare-associated pneumonia)   Obstipation   Acute urinary retention   Acute metabolic encephalopathy    Assessment and Plan:   Acute metabolic encephalopathy -Likely multifactorial including hypercalcemia, urinary retention, severe constipation -Not sure he has infection, there is no fever, no leukocytosis , he received broad-spectrum antibiotic initially -blood culture no growth,  -care focus transition to full comfort measures on 1/20  Hypocalcemia, Treated with Zometa x 1 1/19 Received hydration care focus transition to full comfort measures on 1/20  Hyponatremia Improved with hydration care focus transition to full comfort measures on 1/20  Sever constipation, Seen on CT scan Enema x2 Senokot and MiraLAX per tube care focus transition to full comfort measures on 1/20  Acute urinary retention Per record,After I/o 1200 ml of urine obtained   bladder scan 189 today and urination, currently does not need indwelling foley care focus transition to full comfort measures on 1/20  Normocytic anemia in the setting of malignancy  metastatic esophageal cancer on  tube feedings via PEG tube, failure to thrive CT on admission showed disease progression Seen by oncology Dr. Benay Spice  who recommend hospice, family is in agreement,  Seen by palliative care on 1/20, care focus transition to full comfort measures on 1/20, g tube now for venting purpose to help ab pain if needed, he is started on comfort feeds After multiple discussions with family member , palliative care , social worker , hospice liaison , plan to discharge home  with home hospice  on Monday, plan to discharge home with [facility] transportation, discussed with HPOA [name] ,she is in agreement with current plan.    Positive COVID test Does not appear to have respiratory symptoms, he is on room air Family states that pt had covid last month  .    I have Reviewed nursing notes, Vitals, pain scores, I/o's, since pt's last encounter, details please see discussion above      Code Status:   Code Status: DNR  Family Communication: HPOA Latonya  over the phone Disposition:   Dispo: The patient is from: SNF              Anticipated d/c is to: Home hospice on 1/22                  Objective: Vitals:   02/12/22 2202 02/13/22 0225 02/13/22 0610 02/13/22 1338  BP: 119/71 121/68 109/67 105/67  Pulse: (!) 101 100 92 84  Resp: 20 19 (!) 23 18  Temp: 99 F (37.2 C) 99.6 F (37.6 C) 99.5 F (37.5 C) 98.4 F (36.9 C)  TempSrc:  Oral Oral Oral  SpO2: 98% 96% 96% 97%  Weight:      Height:        Intake/Output Summary (Last 24 hours) at 02/13/2022 1659 Last data filed at 02/13/2022 0610 Gross per 24 hour  Intake 1100 ml  Output 650 ml  Net 450 ml   Filed Weights   02/11/22 2025 02/12/22 1406  Weight: 85.5 kg 86.9 kg    Examination:  General exam: alert, awake, communicative,calm, NAD, only oriented to person Respiratory system: Respiratory effort normal, on room air Cardiovascular system:  RRR.  Gastrointestinal system: Abdomen is nondistended, soft and nontender.  Normal bowel  sounds heard, + peg tube Central nervous system: Alert and oriented to person only Extremities:  no edema Skin: No rashes, lesions or ulcers Psychiatry: Calm and cooperative    Data Reviewed: I have personally reviewed  labs and visualized  imaging studies since the last encounter and formulate the plan        Scheduled Meds:  Chlorhexidine Gluconate Cloth  6 each Topical Daily   polyethylene glycol  17 g Per Tube Daily   senna-docusate  1 tablet Per Tube BID   sodium chloride flush  10-40 mL Intracatheter Q12H   Continuous Infusions:     LOS: 0 days   Time spent:  23mns  FFlorencia Reasons MD PhD FACP [facility]  Available via Epic secure chat 7am-7pm for nonurgent issues Please page for urgent issues To page the attending provider between 7A-7P or the covering provider during after hours 7P-7A, please log into the web site [email or web address] and access using universal [place] password for that web site. If you do not have the password, please call the hospital operator.    02/13/2022, 4:59 PM

## 2022-02-13 NOTE — Consult Note (Signed)
Consultation Note Date: 02/13/2022   Patient Name: Leslie Livingston  DOB: Feb 27, 1950  MRN: [id number]  Age / Sex: 72 y.o., male   PCP: Lorene Dy, MD Referring Physician: Florencia Reasons, MD  Reason for Consultation: Establishing goals of care     Chief Complaint/History of Present Illness:   Patient is a 72 year old male with a past medical history of esophageal cancer, tube placement secondary to cancer dysphagia, COVID-19, and failure to thrive who was admitted on 02/11/2022 for management of abdominal pain.  Patient's imaging is consistent with progressive metastatic disease.  Patient has been off therapies for esophageal cancer for over a month and is not a candidate for further systemic therapies.  Oncology evaluated patient and recommended hospice care.  Palliative medicine team consulted to assist with complex medical decision making.  Extensive review of EMR and multiple conversations during the day today regarding patient's care.  Has been confusion about who can make medical decisions for patient.  Patient has ACP documents in EMR naming [name] as his healthcare power of attorney.  Document also names patient's wishes for medical care such as not wanting life to be prolonged if he has a condition that not be cured and will result in his death within a relatively short period of time.  [name], who has been at bedside, expressed wishes to get patient home with hospice so she can further care for him there as patient has stated wishes to go home and die there.  There has been concern regarding decision maker as niece, Merdis Delay, has called to state that she has more recent healthcare power of attorney documentation though these are not listed in EMR and have not been provided to the hospital.  Multiple discussions throughout the day regarding this.  Presented to bedside later in the afternoon to meet the patient.  Present at bedside was [name] as well as 2 friends.   Introduced myself and the role of the palliative medicine team.  When interacting with patient, he is able to express simple wishes though not able to discuss complex issues.  Patient knows he has cancer though he could not name what type initially.  Patient also knows that the cancer is making him very sick.  With further questioning, patient knows that he is going to die from the cancer.  When asking for his wishes, patient able to express wanting to go home with [place].  We discussed going home with hospice support.  Though patient not able initially to discuss what would happen once he was home, eventually able to process he will die at home with hospice while his symptoms are manage.  Latonya at bedside very much agreeing with this plan. We also discussed that patient's tube feeds will be discontinued as they are causing patient discomfort at this time.  Will instead transition care to focus on patient's comfort such as symptom management for pain, nausea, vomiting, and anxiety.  Patient agreeing wanting pain management.  Latonya agreeing with transitioning to comfort focused care at this time and discontinuing tube feeds.  Noted would work to determine how to get patient safely home with hospice support.  Patient and [name] in agreement with this plan.  Able to call patient's niece, Merdis Delay.  Reduced myself and the role of the palliative medicine team spent time exploring what she has heard regarding patient's care.  She has heard that patient has cancer that has progressed and she understands patient wants to get home as  that is important to him.  We discussed the idea of transitioning to comfort focused care at this time knowing patient is dying due to his worsening cancer burden. Also discussed that in the setting patient's tube feeds are causing him discomfort.  Will instead allow for comfort feeds and will not force tube feeds. Need to focus on quality time, not quantity.  Acknowledged that  patient wants to get home and would need 24/7 support with hospice.  Explained that [place] noted she would help to care for the patient and make sure patient had 24/7 care.  Daphnie wanting to make sure patient is best cared for and was inquiring if CNA was needed to assist with care.  Explained what home hospice would and would not offer.  Also explained that normally family members provide patient's care at the end of life with the assistance of hospice. Daphnie acknowledged this. Daphnie's main concern seem to be that patient was going to be discharged and no one from hospice would be able to readily come out to meet patient and [name] to teach her how to care for patient in the setting of end-of-life care.  Acknowledged this and noted would reach out to [facility] liaison about when a representative could come out to the house to make sure that there is coordination at time of discharge. Daphnie voiced agreeing with this plan.  After extensive conversations, updated entire care team regarding plan to get patient home with hospice with [facility] support when an [facility] representative can be present at the home to make sure [name] has all the training and item she needs to care for patient.  Primary Diagnoses  Present on Admission:  COVID-19 virus infection  ANEMIA-NOS  Essential hypertension  Malnutrition of moderate degree  Esophageal mass  Sepsis ([place])  HCAP (healthcare-associated pneumonia)  Obstipation  Acute urinary retention  Acute metabolic encephalopathy   Palliative Review of Systems: Abdominal pain  Past Medical History:  Diagnosis Date   Allergy    Arthritis    Hyperlipidemia    Hypertension    Social History   Socioeconomic History   Marital status: Single    Spouse name: Not on file   Number of children: Not on file   Years of education: Not on file   Highest education level: Not on file  Occupational History   Not on file  Tobacco Use   Smoking status: Former     Packs/day: 0.25    Types: Cigarettes    Quit date: 05/2021    Years since quitting: 0.7    Passive exposure: Past   Smokeless tobacco: Never  Vaping Use   Vaping Use: Never used  Substance and Sexual Activity   Alcohol use: Yes    Alcohol/week: 6.0 standard drinks of alcohol    Types: 6 Cans of beer per week   Drug use: No   Sexual activity: Not on file  Other Topics Concern   Not on file  Social History Narrative   Not on file   Social Determinants of Health   Financial Resource Strain: Medium Risk (09/29/2021)   Overall Financial Resource Strain (CARDIA)    Difficulty of Paying Living Expenses: Somewhat hard  Food Insecurity: No Food Insecurity (01/08/2022)   Hunger Vital Sign    Worried About Running Out of Food in the Last Year: Never true    Ran Out of Food in the Last Year: Never true  Transportation Needs: No Transportation Needs (01/08/2022)   PRAPARE - Transportation  Lack of Transportation (Medical): No    Lack of Transportation (Non-Medical): No  Physical Activity: Inactive (09/29/2021)   Exercise Vital Sign    Days of Exercise per Week: 0 days    Minutes of Exercise per Session: 0 min  Stress: Stress Concern Present (09/29/2021)   [place]    Feeling of Stress : To some extent  Social Connections: Socially Integrated (09/29/2021)   Social Connection and Isolation Panel [NHANES]    Frequency of Communication with Friends and Family: Three times a week    Frequency of Social Gatherings with Friends and Family: Three times a week    Attends Religious Services: More than 4 times per year    Active Member of Clubs or Organizations: No    Attends Archivist Meetings: 1 to 4 times per year    Marital Status: Married   Family History  Problem Relation Age of Onset   Hypertension Other    Colon cancer Neg Hx    Stomach cancer Neg Hx    Esophageal cancer Neg Hx    Rectal cancer Neg Hx     Scheduled Meds:  Chlorhexidine Gluconate Cloth  6 each Topical Daily   feeding supplement (PROSource TF20)  60 mL Per Tube Daily   free water  150 mL Per Tube Q6H   polyethylene glycol  17 g Per Tube Daily   senna-docusate  1 tablet Per Tube BID   sodium chloride flush  10-40 mL Intracatheter Q12H   Continuous Infusions:  ceFEPime (MAXIPIME) IV 2 g (02/13/22 0602)   feeding supplement (OSMOLITE 1.5 CAL)     metronidazole 500 mg (02/13/22 0435)   PRN Meds:.acetaminophen **OR** acetaminophen, bisacodyl, fentaNYL (SUBLIMAZE) injection, HYDROcodone-acetaminophen, ipratropium-albuterol, polyethylene glycol, sodium chloride flush No Known Allergies CBC:    Component Value Date/Time   WBC 8.1 02/12/2022 0530   HGB 7.0 (L) 02/12/2022 0530   HGB 10.3 (L) 01/07/2022 0827   HCT 23.0 (L) 02/12/2022 0530   PLT 268 02/12/2022 0530   PLT 271 01/07/2022 0827   MCV 87.8 02/12/2022 0530   NEUTROABS 7.5 02/11/2022 2037   LYMPHSABS 1.4 02/11/2022 2037   MONOABS 1.4 (H) 02/11/2022 2037   EOSABS 0.2 02/11/2022 2037   BASOSABS 0.0 02/11/2022 2037   Comprehensive Metabolic Panel:    Component Value Date/Time   NA 133 (L) 02/12/2022 0530   K 3.8 02/12/2022 0530   CL 96 (L) 02/12/2022 0530   CO2 26 02/12/2022 0530   BUN 19 02/12/2022 0530   CREATININE 0.77 02/12/2022 0530   CREATININE 0.90 01/07/2022 0827   GLUCOSE 126 (H) 02/12/2022 0530   CALCIUM 10.6 (H) 02/12/2022 0530   AST 29 02/12/2022 0530   AST 12 (L) 01/07/2022 0827   ALT 23 02/12/2022 0530   ALT 9 01/07/2022 0827   ALKPHOS 109 02/12/2022 0530   BILITOT 0.4 02/12/2022 0530   BILITOT 0.8 01/07/2022 0827   PROT 7.9 02/12/2022 0530   ALBUMIN 2.3 (L) 02/12/2022 0530    Physical Exam: Vital Signs: BP 105/67 (BP Location: Right Arm)   Pulse 84   Temp 98.4 F (36.9 C) (Oral)   Resp 18   Ht '6\' 1"'$  (1.854 m)   Wt 86.9 kg   SpO2 97%   BMI 25.28 kg/m  SpO2: SpO2: 97 % O2 Device: O2 Device: Room Air O2 Flow Rate:    Intake/output summary:  Intake/Output Summary (Last 24 hours) at 02/13/2022 1347 Last data filed  at 02/13/2022 0610 Gross per 24 hour  Intake 1450 ml  Output 900 ml  Net 550 ml   LBM: Last BM Date : 02/13/22 Baseline Weight: Weight: 85.5 kg Most recent weight: Weight: 86.9 kg  General: NAD, awake, able to participate in simple discussions, chronically ill appearing, frail Eyes: No discharge noted HENT: Dry mucous membranes Cardiovascular: RRR Respiratory: no increased work of breathing noted, not in respiratory distress Abdomen: Tender to palpation, PEG tube present Extremities: Muscle wasting present in all extremities Skin: no rashes or lesions on visible skin Neuro: Awake and interactive though not able to fully participate in discussing risk/benefits, and complexities of medical care, only able to provide simple answers Psych: Pleasant, calm          Palliative Performance Scale: 20%               Additional Data Reviewed: Recent Labs    02/11/22 2037 02/12/22 0530  WBC 10.5 8.1  HGB 7.8* 7.0*  PLT 307 268  NA 131* 133*  BUN 19 19  CREATININE 0.81 0.77    Imaging: DG Abd 1 View CLINICAL DATA:  Obstipation  EXAM: ABDOMEN - 1 VIEW  COMPARISON:  08/21/2021  FINDINGS: Normal amount of stool overlies the colon. The rectosigmoid is obscured by excreted contrast in the urinary bladder. No gaseous distention of bowel to suggest obstruction.  IMPRESSION: Unremarkable examination. The rectosigmoid is obscured by excreted contrast in the urinary bladder.  Electronically Signed   By: Sammie Bench M.D.   On: 02/12/2022 10:33    I personally reviewed recent imaging.   Palliative Care Assessment and Plan Summary of Established Goals of Care and Medical Treatment Preferences   Patient is a 72 year old male with a past medical history of esophageal cancer, tube placement secondary to cancer dysphagia, COVID-19, and failure to thrive who was admitted on  02/11/2022 for management of abdominal pain.  Patient's imaging is consistent with progressive metastatic disease.  Patient has been off therapies for esophageal cancer for over a month and is not a candidate for further systemic therapies.  Oncology evaluated patient and recommended hospice care.  Palliative medicine team consulted to assist with complex medical decision making.  # Complex medical decision making/goals of care  -Patient able to participate in simple discussions though not complexities of medical care.  Did discuss care with patient and [name] who was at bedside.  Also discussed care with patient's niece, Merdis Delay, via telephone after visit with patient.  All involved in conversations agree patient's cancer has advanced and he is reaching the end of his life.  Priority at this time is on symptom management and quality time with family.  [name] and Daphnie want patient to go home, they just want to make sure patient has the support he needs and they have the training to care for him at home.  Both agreeing with hospice care at home for end-of-life management. Daphnie wants to make sure hospice liaison can meet family at home close to time of discharge so care can be most supportive of patient.   -All agree with transitioning to comfort focused care at this time. Will discontinue interventions such as IVFs, imaging, tube feeds, and lab draws. Will instead focus on symptom management such as pain, dyspnea, and agitation.   -Will note ACP documents in our EMR name [name] as patient's HCPOA. Realize Daphine Marcelyn Ditty states she has most recent HCPOA documents but these have not been produced and therefore can not  be verified as legal.   -  Code Status: DNR   # Symptom management  Pain/Dyspnea, acute in the setting of end-of-life care                Patient was not on medications for pain previously.                               -Started po oxycodone '5mg'$  q4hrs prn.  Continue to  adjust based on patient's symptom burden.  If patient needing frequent dosing, may need to consider continuous infusion.    -Start IV morphine '2mg'$  q1hr prn for breakthrough pain or dyspnea after oral medications.                  -Anxiety/agitation, in the setting of end-of-life care                               -Started po/via tube Ativan 0.5 mg every 4 hours as needed. Continue to adjust based on patient's symptom burden.                                 -And also has po/SL/IV Haldol 0.5 mg every 4 hours as needed. Continue to adjust based on patient's symptom burden.                   -Secretions, in the setting of end-of-life care                               -Started po/SL/IV glycopyrrolate 0.2 mg every 4 hours as needed.   -Constipation   -Maintain on bowel regimen to minimize abdominal pain   # Psycho-social/Spiritual Support:  - Support System: [place]  # Discharge Planning:  Home with hospice only when hospice liaison will be able to meet at home relatively soon after arrival to train family on care. Please make sure to provide comfort medications to family before patient leaves hospital to make sure he has a supply prior to hospice taking over management.   Thank you for allowing the palliative care team to participate in the care Al[place].  Chelsea Aus, DO Palliative Care Provider PMT # [phone]  This provider spent a total of 95 minutes providing patient's care.  Includes review of EMR, discussing care with other staff members involved in patient's medical care, obtaining relevant history and information from patient and/or patient's family, and personal review of imaging and lab work. Greater than 50% of the time was spent counseling and coordinating care related to the above assessment and plan.

## 2022-02-13 NOTE — Progress Notes (Signed)
Manufacturing engineer [facility]) Hospital Liaison Note  Plan is to discharge home with daughter today. DME (3 in 1, 4 wheel walker, feeding pump) ordered and awaitiing delivery ETA.   Per MD Erlinda Hong, patient can dc without DME at home.   Please send comfort medications/prescriptions with patient at discharge.   Please call with any questions or concerns. Thank you  Roselee Nova, [facility] Hospital Liaison [phone]

## 2022-02-13 NOTE — Progress Notes (Signed)
OT Cancellation Note  Patient Details Name: Leslie Livingston MRN: [id number] DOB: 12/17/1950   Cancelled Treatment:    Reason Eval/Treat Not Completed: Other (comment) OT deferred d/t plan noted to be for home with Hospice; However per [facility] team/SW and RN pt family not in full agreement for plan for home/hospice, will defer today and see if needed or plan should warrant OT input--- as schedule allows.  Rennie Plowman, MS Acute Rehabilitation Department Office# [phone]  02/13/2022, 3:01 PM

## 2022-02-13 NOTE — TOC Progression Note (Signed)
Transition of Care [facility]) - Progression Note   Patient Details  Name: Leslie Livingston MRN: [id number] Date of Birth: [birth year]  Transition of [facility]) CM/SW [place], LCSW Phone Number: 02/13/2022, 2:48 PM  Clinical Narrative: CSW spoke with patient's niece, Merdis Delay, to discuss discharge plan in regards to hospice services. Per daughter, [name] is a family friend/caregiver that patient calls his daughter, but is not related. Daughter is aware that home with hospice is being recommended at this time and is aware hospice does not provide 24/7 care in the home, so if additional care is needed it will be private pay through a private duty agency. Patient is not eligible for residential hospice at this time. Niece asked questions about patient being discharged while having medical issues and CSW explained that the focus at this time is more on symptom management and comfort as treatment will be limited given his diagnoses. Niece reported she will bring her HCPOA paperwork to the hospital tomorrow. Treatment team updated.  Social Determinants of Health ([place]) Interventions SDOH Screenings   Food Insecurity: No Food Insecurity (01/08/2022)  Housing: Low Risk  (01/08/2022)  Transportation Needs: No Transportation Needs (01/08/2022)  Utilities: Not At Risk (01/08/2022)  Alcohol Screen: Low Risk  (09/29/2021)  Depression (PHQ2-9): Medium Risk (09/29/2021)  Financial Resource Strain: Medium Risk (09/29/2021)  Physical Activity: Inactive (09/29/2021)  Social Connections: Socially Integrated (09/29/2021)  Stress: Stress Concern Present (09/29/2021)  Tobacco Use: Medium Risk (02/11/2022)   Readmission Risk Interventions    01/08/2022   12:32 PM  Readmission Risk Prevention Plan  Transportation Screening Complete  PCP or Specialist Appt within 3-5 Days Complete  HRI or [place] Complete  Social Work Consult for [place] Planning/Counseling Complete  Palliative Care  Screening Complete  Medication Review Press photographer) Complete

## 2022-02-13 NOTE — Plan of Care (Signed)
  Problem: Fluid Volume: Goal: Hemodynamic stability will improve Outcome: Progressing   Problem: Clinical Measurements: Goal: Diagnostic test results will improve Outcome: Progressing Goal: Signs and symptoms of infection will decrease Outcome: Progressing   Problem: Respiratory: Goal: Ability to maintain adequate ventilation will improve Outcome: Progressing   Problem: Activity: Goal: Ability to tolerate increased activity will improve Outcome: Progressing   Problem: Clinical Measurements: Goal: Ability to maintain a body temperature in the normal range will improve Outcome: Progressing   Problem: Respiratory: Goal: Ability to maintain adequate ventilation will improve Outcome: Progressing Goal: Ability to maintain a clear airway will improve Outcome: Progressing   Problem: Education: Goal: Knowledge of General Education information will improve Description: Including pain rating scale, medication(s)/side effects and non-pharmacologic comfort measures Outcome: Progressing   Problem: Health Behavior/Discharge Planning: Goal: Ability to manage health-related needs will improve Outcome: Progressing   Problem: Clinical Measurements: Goal: Ability to maintain clinical measurements within normal limits will improve Outcome: Progressing Goal: Will remain free from infection Outcome: Progressing Goal: Diagnostic test results will improve Outcome: Progressing Goal: Respiratory complications will improve Outcome: Progressing Goal: Cardiovascular complication will be avoided Outcome: Progressing   Problem: Activity: Goal: Risk for activity intolerance will decrease Outcome: Progressing   Problem: Nutrition: Goal: Adequate nutrition will be maintained Outcome: Progressing   Problem: Coping: Goal: Level of anxiety will decrease Outcome: Progressing

## 2022-02-13 NOTE — Progress Notes (Signed)
PT Cancellation Note  Patient Details Name: Leslie Livingston MRN: [id number] DOB: 09-14-1950   Cancelled Treatment:    Reason Eval/Treat Not Completed: Other (comment); PT deferred d/t plan noted to be for home with Hospice; However per [facility] team/SW and RN pt family not in full agreement for plan for home/hospice, will defer today and see if needed or plan should warrant PT input--- as schedule allows.   [facility] 02/13/2022, 1:39 PM

## 2022-02-14 ENCOUNTER — Other Ambulatory Visit: Payer: Self-pay

## 2022-02-14 DIAGNOSIS — U071 COVID-19: Secondary | ICD-10-CM | POA: Diagnosis not present

## 2022-02-14 LAB — URINE CULTURE: Culture: NO GROWTH

## 2022-02-14 NOTE — Progress Notes (Signed)
PROGRESS NOTE    Leslie Livingston  DVV:[id number] DOB: Dec 29, 1950 DOA: 02/11/2022 PCP: Lorene Dy, MD     Brief Narrative:   metastatic esophageal cancer on tube feedings via PEG tube, failure to thrive,  Presents with abdominal pain and foul-smelling urine patient has been confused   Subjective:  Care transition to full comfort measures, awaiting to go home on home hospice on 1/22 am No acute event overnight He is oriented to person but not  to time or place, not able to provide reliable history   Assessment & Plan:  Principal Problem:   COVID-19 virus infection Active Problems:   ANEMIA-NOS   Essential hypertension   Esophageal mass   Malnutrition of moderate degree   Sepsis ([place])   HCAP (healthcare-associated pneumonia)   Obstipation   Acute urinary retention   Acute metabolic encephalopathy   Palliative care encounter   SOB (shortness of breath)   Goals of care, counseling/discussion   DNR (do not resuscitate)   End of life care   Coordination of complex care   COVID-19   Constipation    Assessment and Plan:   Acute metabolic encephalopathy, reason for admission -Likely multifactorial including hypercalcemia, urinary retention, severe constipation -Not sure he has infection, there is no fever, no leukocytosis , he received broad-spectrum antibiotic initially -blood culture no growth,  -care focus transition to full comfort measures on 1/20  Hypocalcemia, Treated with Zometa x 1 1/19 Received hydration care focus transition to full comfort measures on 1/20  Hyponatremia Improved with hydration care focus transition to full comfort measures on 1/20  Sever constipation, Seen on CT scan Enema x2 Senokot and MiraLAX per tube care focus transition to full comfort measures on 1/20  Acute urinary retention Per record,After I/o 1200 ml of urine obtained   bladder scan 189 today and urination, currently does not need indwelling foley care focus  transition to full comfort measures on 1/20  Normocytic anemia in the setting of malignancy  metastatic esophageal cancer on tube feedings via PEG tube, failure to thrive CT on admission showed disease progression Seen by oncology Dr. Benay Spice  who recommend hospice, family is in agreement,  Seen by palliative care on 1/20, care focus transition to full comfort measures on 1/20, g tube now for venting purpose to help ab pain if needed, he is started on comfort feeds After multiple discussions with family member , palliative care , social worker , hospice liaison , plan to discharge home  with home hospice  on Monday, plan to discharge home with PTAR transportation, discussed with HPOA [name] ,she is in agreement with current plan.    Positive COVID test Does not appear to have respiratory symptoms, he is on room air Family states that pt had covid last month  .    I have Reviewed nursing notes, Vitals, pain scores, I/o's, since pt's last encounter, details please see discussion above      Code Status:   Code Status: DNR  Family Communication: HPOA Latonya  on 1/19 and 1/20 Disposition:   Dispo: The patient is from: SNF              Anticipated d/c is to:  plan to discharge home  with home hospice  on Monday morning, needs PTAR transportation to home                 Objective: Vitals:   02/13/22 0610 02/13/22 1338 02/14/22 0310 02/14/22 1345  BP: 109/67 105/67 119/68 99/60  Pulse: 92 84 99 95  Resp: (!) '23 18 20 20  '$ Temp: 99.5 F (37.5 C) 98.4 F (36.9 C) 99.3 F (37.4 C) 97.6 F (36.4 C)  TempSrc: Oral Oral Oral Oral  SpO2: 96% 97% 97% 100%  Weight:   83.6 kg   Height:        Intake/Output Summary (Last 24 hours) at 02/14/2022 1913 Last data filed at 02/14/2022 0300 Gross per 24 hour  Intake --  Output 950 ml  Net -950 ml   Filed Weights   02/11/22 2025 02/12/22 1406 02/14/22 0310  Weight: 85.5 kg 86.9 kg 83.6 kg    Examination:  General exam: alert,  awake, communicative,calm, NAD, only oriented to person Respiratory system: Respiratory effort normal, on room air Cardiovascular system:  RRR.  Gastrointestinal system: Abdomen is nondistended, soft and nontender.  Normal bowel sounds heard, + peg tube Central nervous system: Alert and oriented to person only Extremities:  no edema Skin: No rashes, lesions or ulcers Psychiatry: Calm and cooperative    Data Reviewed: I have personally reviewed  labs and visualized  imaging studies since the last encounter and formulate the plan        Scheduled Meds:  Chlorhexidine Gluconate Cloth  6 each Topical Daily   polyethylene glycol  17 g Per Tube Daily   senna-docusate  1 tablet Per Tube BID   sodium chloride flush  10-40 mL Intracatheter Q12H   Continuous Infusions:     LOS: 1 day   Time spent:  85mns  FFlorencia Reasons MD PhD FACP [facility]  Available via Epic secure chat 7am-7pm for nonurgent issues Please page for urgent issues To page the attending provider between 7A-7P or the covering provider during after hours 7P-7A, please log into the web site [email or web address] and access using universal [place] password for that web site. If you do not have the password, please call the hospital operator.    02/14/2022, 7:13 PM

## 2022-02-14 NOTE — Plan of Care (Signed)
  Problem: Coping: Goal: Level of anxiety will decrease Outcome: Progressing   Problem: Elimination: Goal: Will not experience complications related to urinary retention Outcome: Progressing   Problem: Pain Managment: Goal: General experience of comfort will improve Outcome: Progressing   Problem: Safety: Goal: Ability to remain free from injury will improve Outcome: Progressing   Problem: Skin Integrity: Goal: Risk for impaired skin integrity will decrease Outcome: Progressing   Problem: Coping: Goal: Psychosocial and spiritual needs will be supported Outcome: Progressing   Problem: Respiratory: Goal: Will maintain a patent airway Outcome: Progressing

## 2022-02-14 NOTE — Progress Notes (Signed)
  Daily Progress Note   Patient Name: Leslie Livingston       Date: 02/14/2022 DOB: [birth year]  Age: 72 y.o. MRN#: [id number] Attending Physician: Florencia Reasons, MD Primary Care Physician: Lorene Dy, MD Admit Date: 02/11/2022 Length of Stay: 1 day  Palliative medicine provider continues to follow with patient's care.  Had extensive discussion with patient and multiple family members on 02/13/2022 and patient was transitioned to comfort focused care. Plan is for patient to discharge home with [facility] hospice on 02/15/22.  Reviewed EMR today and patient continues to receive as needed medication as needed for pain management. Received oxycodone '5mg'$  x 4 doses in past 24 hours. Discussed with bedside tech and RN who noted pain seems much better controlled today. Will continue to monitor for adjustments required.  Attempted to visit with patient though he was about to be cleaned after bowel movement so did not interrupt his care.  Will continue to follow with patient's journey.   Chelsea Aus, DO Palliative Care Provider PMT # [phone]

## 2022-02-15 ENCOUNTER — Encounter: Payer: Self-pay | Admitting: Oncology

## 2022-02-15 ENCOUNTER — Other Ambulatory Visit: Payer: Self-pay

## 2022-02-15 ENCOUNTER — Other Ambulatory Visit (HOSPITAL_BASED_OUTPATIENT_CLINIC_OR_DEPARTMENT_OTHER): Payer: Self-pay

## 2022-02-15 DIAGNOSIS — U071 COVID-19: Secondary | ICD-10-CM | POA: Diagnosis not present

## 2022-02-15 DIAGNOSIS — G9341 Metabolic encephalopathy: Secondary | ICD-10-CM | POA: Diagnosis not present

## 2022-02-15 DIAGNOSIS — E440 Moderate protein-calorie malnutrition: Secondary | ICD-10-CM

## 2022-02-15 DIAGNOSIS — A419 Sepsis, unspecified organism: Secondary | ICD-10-CM

## 2022-02-15 DIAGNOSIS — R338 Other retention of urine: Secondary | ICD-10-CM

## 2022-02-15 DIAGNOSIS — R0602 Shortness of breath: Secondary | ICD-10-CM | POA: Diagnosis not present

## 2022-02-15 DIAGNOSIS — D500 Iron deficiency anemia secondary to blood loss (chronic): Secondary | ICD-10-CM

## 2022-02-15 DIAGNOSIS — I10 Essential (primary) hypertension: Secondary | ICD-10-CM

## 2022-02-15 LAB — LEGIONELLA PNEUMOPHILA SEROGP 1 UR AG: L. pneumophila Serogp 1 Ur Ag: NEGATIVE

## 2022-02-15 MED ORDER — ONDANSETRON 4 MG PO TBDP
4.0000 mg | ORAL_TABLET | Freq: Four times a day (QID) | ORAL | 0 refills | Status: AC | PRN
  Filled 2022-02-15: qty 20, 5d supply, fill #0

## 2022-02-15 MED ORDER — OXYCODONE HCL 5 MG/5ML PO SOLN
5.0000 mg | Freq: Four times a day (QID) | ORAL | 0 refills | Status: DC
  Filled 2022-02-15: qty 50, 3d supply, fill #0

## 2022-02-15 MED ORDER — HALOPERIDOL 0.5 MG PO TABS
0.5000 mg | ORAL_TABLET | ORAL | 0 refills | Status: AC | PRN
  Filled 2022-02-15: qty 15, 3d supply, fill #0

## 2022-02-15 MED ORDER — HEPARIN SOD (PORK) LOCK FLUSH 100 UNIT/ML IV SOLN
500.0000 [IU] | INTRAVENOUS | Status: AC | PRN
  Administered 2022-02-15: 500 [IU]

## 2022-02-15 MED ORDER — BISACODYL 10 MG RE SUPP
10.0000 mg | Freq: Every day | RECTAL | 0 refills | Status: AC | PRN
  Filled 2022-02-15: qty 12, 12d supply, fill #0

## 2022-02-15 MED ORDER — POLYETHYLENE GLYCOL 3350 17 GM/SCOOP PO POWD
17.0000 g | Freq: Every day | ORAL | 0 refills | Status: DC | PRN
  Filled 2022-02-15: qty 238, 14d supply, fill #0

## 2022-02-15 MED ORDER — POLYETHYLENE GLYCOL 3350 17 G PO PACK
17.0000 g | PACK | Freq: Every day | ORAL | 0 refills | Status: DC
  Filled 2022-02-15: qty 14, 14d supply, fill #0

## 2022-02-15 MED ORDER — ACETAMINOPHEN 325 MG PO TABS
650.0000 mg | ORAL_TABLET | Freq: Four times a day (QID) | ORAL | 0 refills | Status: DC | PRN
  Filled 2022-02-15: qty 100, 13d supply, fill #0

## 2022-02-15 MED ORDER — LORAZEPAM 0.5 MG PO TABS
0.5000 mg | ORAL_TABLET | ORAL | 0 refills | Status: AC | PRN
  Filled 2022-02-15: qty 10, 2d supply, fill #0

## 2022-02-15 MED ORDER — POLYVINYL ALCOHOL 1.4 % OP SOLN
1.0000 [drp] | Freq: Four times a day (QID) | OPHTHALMIC | 0 refills | Status: DC | PRN
  Filled 2022-02-15: qty 15, 75d supply, fill #0

## 2022-02-15 MED ORDER — BIOTENE DRY MOUTH MT LIQD
15.0000 mL | OROMUCOSAL | 0 refills | Status: AC | PRN
  Filled 2022-02-15: qty 473, 30d supply, fill #0

## 2022-02-15 NOTE — Plan of Care (Signed)
  Problem: Fluid Volume: Goal: Hemodynamic stability will improve Outcome: Completed/Met   Problem: Clinical Measurements: Goal: Diagnostic test results will improve Outcome: Completed/Met Goal: Signs and symptoms of infection will decrease Outcome: Completed/Met   Problem: Respiratory: Goal: Ability to maintain adequate ventilation will improve Outcome: Completed/Met   Problem: Activity: Goal: Ability to tolerate increased activity will improve Outcome: Completed/Met   Problem: Clinical Measurements: Goal: Ability to maintain a body temperature in the normal range will improve Outcome: Completed/Met   Problem: Respiratory: Goal: Ability to maintain adequate ventilation will improve Outcome: Completed/Met Goal: Ability to maintain a clear airway will improve Outcome: Completed/Met   Problem: Education: Goal: Knowledge of General Education information will improve Description: Including pain rating scale, medication(s)/side effects and non-pharmacologic comfort measures Outcome: Completed/Met   Problem: Health Behavior/Discharge Planning: Goal: Ability to manage health-related needs will improve Outcome: Completed/Met   Problem: Clinical Measurements: Goal: Ability to maintain clinical measurements within normal limits will improve Outcome: Completed/Met Goal: Will remain free from infection Outcome: Completed/Met Goal: Diagnostic test results will improve Outcome: Completed/Met Goal: Respiratory complications will improve Outcome: Completed/Met Goal: Cardiovascular complication will be avoided Outcome: Completed/Met   Problem: Activity: Goal: Risk for activity intolerance will decrease Outcome: Completed/Met   Problem: Nutrition: Goal: Adequate nutrition will be maintained Outcome: Completed/Met   Problem: Coping: Goal: Level of anxiety will decrease Outcome: Completed/Met   Problem: Elimination: Goal: Will not experience complications related to bowel  motility Outcome: Completed/Met Goal: Will not experience complications related to urinary retention Outcome: Completed/Met   Problem: Pain Managment: Goal: General experience of comfort will improve Outcome: Completed/Met   Problem: Safety: Goal: Ability to remain free from injury will improve Outcome: Completed/Met   Problem: Skin Integrity: Goal: Risk for impaired skin integrity will decrease Outcome: Completed/Met   Problem: Education: Goal: Knowledge of the prescribed therapeutic regimen will improve Outcome: Completed/Met   Problem: Coping: Goal: Ability to identify and develop effective coping behavior will improve Outcome: Completed/Met   Problem: Clinical Measurements: Goal: Quality of life will improve Outcome: Completed/Met   Problem: Respiratory: Goal: Verbalizations of increased ease of respirations will increase Outcome: Completed/Met   Problem: Role Relationship: Goal: Family's ability to cope with current situation will improve Outcome: Completed/Met Goal: Ability to verbalize concerns, feelings, and thoughts to partner or family member will improve Outcome: Completed/Met   Problem: Pain Management: Goal: Satisfaction with pain management regimen will improve Outcome: Completed/Met   Problem: Education: Goal: Knowledge of risk factors and measures for prevention of condition will improve Outcome: Completed/Met   Problem: Coping: Goal: Psychosocial and spiritual needs will be supported Outcome: Completed/Met   Problem: Respiratory: Goal: Will maintain a patent airway Outcome: Completed/Met Goal: Complications related to the disease process, condition or treatment will be avoided or minimized Outcome: Completed/Met

## 2022-02-15 NOTE — Progress Notes (Addendum)
Manufacturing engineer [facility])    MSW spoke with daughter/Latonya & plan remains for patient to D/C with hospice services. Latonya requested that DME (walker & 3 in 1) are ordered but is fine with patient discharging the hospital before equipment arrives. DME to be delivered today. Plan remains for patient to DC via EMS on 1.22. AV scheduled for this PM @ 7 due to transport conflicts.  TOC/Cookie to arrange transport and TOC notified to alert [name] once transport is set.    If applicable, please send signed and completed DNR with patient/family upon discharge. Please provide prescriptions at discharge as needed to ensure ongoing symptom management and a transport packet.   [facility] information and contact numbers given to family and above information shared with TOC.    Please call with any questions/concerns.    Thank you for the opportunity to participate in this patient's care   Phillis Haggis, MSW [facility] Liaison  [phone]

## 2022-02-15 NOTE — TOC Progression Note (Signed)
Transition of Care [facility]) - Progression Note    Patient Details  Name: Leslie Livingston MRN: [id number] Date of Birth: 1950-12-24  [facility] CM/SW Contact  Purcell Mouton, RN Phone Number: 02/15/2022, 12:54 PM  Clinical Narrative:     [name] was called. RN and family [name] are aware.        Expected Discharge Plan and Services         Expected Discharge Date: 02/15/22                                     Social Determinants of Health (Page) Interventions [place]: No Food Insecurity (02/13/2022)  Housing: Low Risk  (02/13/2022)  Transportation Needs: No Transportation Needs (02/13/2022)  Utilities: Not At Risk (02/13/2022)  Alcohol Screen: Low Risk  (09/29/2021)  Depression (PHQ2-9): Medium Risk (09/29/2021)  Financial Resource Strain: Medium Risk (09/29/2021)  Physical Activity: Inactive (09/29/2021)  Social Connections: Socially Integrated (09/29/2021)  Stress: Stress Concern Present (09/29/2021)  Tobacco Use: Medium Risk (02/11/2022)    Readmission Risk Interventions    01/08/2022   12:32 PM  Readmission Risk Prevention Plan  Transportation Screening Complete  PCP or Specialist Appt within 3-5 Days Complete  HRI or [place] Complete  Social Work Consult for [place] Planning/Counseling Complete  Palliative Care Screening Complete  Medication Review Press photographer) Complete

## 2022-02-15 NOTE — Progress Notes (Signed)
  Daily Progress Note   Patient Name: Leslie Livingston       Date: 02/15/2022 DOB: 1950-07-09  Age: 72 y.o. MRN#: [id number] Attending Physician: Flora Lipps, MD Primary Care Physician: Lorene Dy, MD Admit Date: 02/11/2022 Length of Stay: 2 days  Palliative medicine provider has been following along with patient's journey. Plan is for patient to discharge home with [facility] hospice support this morning. Please make sure to provide Rx to family for comfort focused medications such as oxycodone and ativan prior to discharge. Needs to remain on bowel regimen.   As goals for care currently determined, palliative medicine team will sign off. Please reach out if our assistance is needed in the future.    Chelsea Aus, DO Palliative Care Provider PMT # [phone]

## 2022-02-15 NOTE — Discharge Summary (Addendum)
Physician Discharge Summary  Leslie Livingston CBJ:[id number] DOB: [birth year] DOA: 02/11/2022  PCP: Lorene Dy, MD  Admit date: 02/11/2022 Discharge date: 02/15/2022  Admitted From: Home  Discharge disposition: home with hospice  Recommendations for Outpatient Follow-Up:   Follow up with your hospice care provider as outpatient.  Discharge Diagnosis:   Principal Problem:   COVID-19 virus infection Active Problems:   ANEMIA-NOS   Essential hypertension   Esophageal mass   Malnutrition of moderate degree   Sepsis ([place])   HCAP (healthcare-associated pneumonia)   Obstipation   Acute urinary retention   Acute metabolic encephalopathy   Palliative care encounter   SOB (shortness of breath)   Goals of care, counseling/discussion   DNR (do not resuscitate)   End of life care   Coordination of complex care   COVID-19   Constipation   Discharge Condition: Improved.  Diet recommendation: Regular  Wound care: None.  Code status: DNR   History of Present Illness:   Leslie Livingston is a 72 y.o. male with past medical history significant of esophageal cancer, anemia, dyslipidemia, GERD, dysphagia presented to hospital with abdominal pain and confusion with foul-smelling urine.  Patient was recently admitted for pneumonia and has history of esophageal cancer on tube feeding.  He was at the skilled nursing facility and had increased work of breathing.  Previous workup for cancer showed no evidence of brain metastasis in December.  He did however have COVID back in December 2023.  Hospital Course:   Following conditions were addressed during hospitalization as listed below,  Acute metabolic encephalopathy, ruled in Likely multifactorial from hypercalcemia, urinary retention, severe constipation.  Patient initially received broad-spectrum antibiotic.  Blood cultures were negative.  Patient was then transitioned to comfort care on 1/20.   Hypocalcemia, Treated with  Zometa x 1,  1/19, Received hydration during hospitalization.   Hyponatremia Improved with hydration.   Severe constipation,  Seen in the CT scan of the abdomen.  Received enema x 2.  Continue Senokot and MiraLAX per tube, was transitioned to comfort care.   Acute urinary retention Improved after initial retention.   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  Failure to thrive.   CT on admission showed disease progression. Seen by oncology Dr. Benay Spice  who recommend hospice, family is in agreement. Seen by palliative care on 02/13/22, care focus transition to full comfort measures on 02/13/22, G-tube now for venting purpose, he was started on comfort feeds. After multiple discussions with family member , palliative care , social worker, hospice liaison , plan to discharge home  with home hospice     Positive COVID test Does not appear to have respiratory symptoms, he is on room air History of COVID last month    Disposition.  At this time, patient is stable for disposition home with hospice set up.  Medical Consultants:   Palliative care Medical oncology  Procedures:    None Subjective:   Today, patient was seen and examined at bedside.  No interval complaints.  Discharge Exam:   Vitals:   02/14/22 2026 02/15/22 0856  BP:    Pulse:    Resp: 18 20  Temp:    SpO2:     Vitals:   02/14/22 1345 02/14/22 1958 02/14/22 2026 02/15/22 0856  BP: 99/60 (!) 116/59    Pulse: 95 100    Resp: '20  18 20  '$ Temp: 97.6 F (36.4 C) 99.9  F (37.7 C)    TempSrc: Oral Oral    SpO2: 100% 100%    Weight:      Height:       General: Alert awake, not in obvious distress HENT: pupils equally reacting to light,  No scleral pallor or icterus noted. Oral mucosa is moist.  Chest:  Clear breath sounds.  Diminished breath sounds bilaterally. No crackles or wheezes.  CVS: S1 &S2 heard. No murmur.  Regular rate and  rhythm. Abdomen: Soft, nontender, nondistended.  Bowel sounds are heard.  PEG tube in place. Extremities: No cyanosis, clubbing or edema.  Peripheral pulses are palpable. Psych: Alert, awake and oriented to place only CNS:  No cranial nerve deficits.  Power equal in all extremities.   Skin: Warm and dry.  No rashes noted.  The results of significant diagnostics from this hospitalization (including imaging, microbiology, ancillary and laboratory) are listed below for reference.     Diagnostic Studies:   DG Abd 1 View  Result Date: 02/12/2022 CLINICAL DATA:  Obstipation EXAM: ABDOMEN - 1 VIEW COMPARISON:  08/21/2021 FINDINGS: Normal amount of stool overlies the colon. The rectosigmoid is obscured by excreted contrast in the urinary bladder. No gaseous distention of bowel to suggest obstruction. IMPRESSION: Unremarkable examination. The rectosigmoid is obscured by excreted contrast in the urinary bladder. Electronically Signed   By: Sammie Bench M.D.   On: 02/12/2022 10:33   CT CHEST ABDOMEN PELVIS W CONTRAST  Result Date: 02/11/2022 CLINICAL DATA:  Abdominal pain, altered level of consciousness, pneumonia, sepsis, esophageal cancer. EXAM: CT CHEST, ABDOMEN, AND PELVIS WITH CONTRAST TECHNIQUE: Multidetector CT imaging of the chest, abdomen and pelvis was performed following the standard protocol during bolus administration of intravenous contrast. RADIATION DOSE REDUCTION: This exam was performed according to the departmental dose-optimization program which includes automated exposure control, adjustment of the mA and/or kV according to patient size and/or use of iterative reconstruction technique. CONTRAST:  162m OMNIPAQUE IOHEXOL 300 MG/ML  SOLN COMPARISON:  02/11/2022, 01/07/2022. FINDINGS: CT CHEST FINDINGS Cardiovascular: The heart is unremarkable without pericardial effusion. No evidence of thoracic aortic aneurysm or dissection. Atherosclerosis of the aorta and coronary vasculature. Stable  left chest wall port. Mediastinum/Nodes: Progressive circumferential wall thickening of the distal thoracic esophagus measuring up to 2.9 cm in diameter, compatible with progressive esophageal cancer. Thyroid and trachea are unremarkable. Progressive left supraclavicular lymphadenopathy, largest lymph node now measuring up to 3 cm in short axis, previously having measured 1.2 cm. Continued mediastinal and hilar adenopathy. Index lymph node right paratracheal region measures 1 cm, stable. Lungs/Pleura: Interval progression of the known pulmonary metastases. Index lesion within the right upper lobe now measures 3.4 x 3.5 cm reference image 39/6, previously measuring 2.2 x 1.5 cm. Masslike consolidation within the left upper and right lower lobes again noted. There is slight improved aeration within the right lung base. There are small bilateral pleural effusions which have developed since prior study, left greater than right. No pneumothorax. Musculoskeletal: Interval progression of metastatic lesion within the left posterior tenth rib, with increased cortical destruction and associated soft tissue mass. Reconstructed images demonstrate no additional findings. CT ABDOMEN PELVIS FINDINGS Hepatobiliary: No focal liver abnormality is seen. No gallstones, gallbladder wall thickening, or biliary dilatation. Pancreas: Unremarkable. No pancreatic ductal dilatation or surrounding inflammatory changes. Spleen: Normal in size without focal abnormality. Adrenals/Urinary Tract: Adrenal glands are unremarkable. Kidneys are normal, without renal calculi, focal lesion, or hydronephrosis. Bladder is unremarkable. Stomach/Bowel: No bowel obstruction or ileus. Normal appendix right  lower quadrant. Large amount of retained stool throughout the colon, most pronounced within the rectal vault consistent with constipation and fecal impaction. No bowel wall thickening or inflammatory change. Percutaneous gastrostomy tube within the gastric  lumen. The stomach is markedly distended. Vascular/Lymphatic: Atherosclerosis of the aorta and its branches. Enlarged lymph node within the gastrohepatic ligament measuring 1.4 cm image 60/2. No other discrete adenopathy within the abdomen or pelvis. Reproductive: Prostate is unremarkable. Other: No free fluid or free intraperitoneal gas. No abdominal wall hernia. Musculoskeletal: There are no acute or destructive bony lesions. There is bilateral hip osteoarthritis, right greater than left. Reconstructed images demonstrate no additional findings. IMPRESSION: 1. Progressive circumferential wall thickening of the distal thoracic esophagus consistent with known esophageal cancer. 2. Worsening metastatic disease, with progressive lymphadenopathy, new and enlarging bilateral pulmonary metastases, and progressive bony metastatic disease within the left posterior tenth rib. 3. Continued areas of masslike consolidation within the left upper and right lower lobes. The right lower lobe consolidation is improved since prior study, and may reflect resolving pneumonia superimposed upon metastatic disease. 4. Enlarged lymph node within the gastrohepatic ligament concerning for subdiaphragmatic metastatic disease. 5. Marked gastric distension. Percutaneous gastrostomy tube is seen within the gastric lumen. 6. Severe fecal retention throughout the colon, with a large amount of stool in the rectal vault consistent with fecal impaction. No bowel obstruction. 7.  Aortic Atherosclerosis (ICD10-I70.0). Electronically Signed   By: Randa Ngo M.D.   On: 02/11/2022 23:25   DG Chest Port 1 View  Result Date: 02/11/2022 CLINICAL DATA:  [id number] Sepsis [facility]) [id number] EXAM: PORTABLE CHEST 1 VIEW COMPARISON:  Chest x-ray 01/11/2022, CT angio chest 01/07/2022 FINDINGS: Left chest wall Port-A-Cath with tip overlying the superior cavoatrial junction. The heart and mediastinal contours are within normal limits. Slightly more conspicuous 2.8  cm round right mid to lower lung zone airspace opacity as well as left upper lobe airspace opacity. No pulmonary edema. No pleural effusion. No pneumothorax. No acute osseous abnormality. IMPRESSION: Slightly more conspicuous 2.8 cm round right mid to lower lung zone airspace opacity as well as left upper lobe airspace opacity. Finding could be combination of infection/inflammation versus malignancy. Recommend CT chest with intravenous contrast for further evaluation. Electronically Signed   By: Iven Finn M.D.   On: 02/11/2022 21:50     Labs:   Basic Metabolic Panel: Recent Labs  Lab 02/11/22 2037 02/12/22 0016 02/12/22 0530  NA 131*  --  133*  K 3.7  --  3.8  CL 97*  --  96*  CO2 27  --  26  GLUCOSE 140*  --  126*  BUN 19  --  19  CREATININE 0.81  --  0.77  CALCIUM 10.9*  --  10.6*  MG  --  1.6* 1.8  PHOS  --  2.6 2.8   GFR Estimated Creatinine Clearance: 94.3 mL/min (by C-G formula based on SCr of 0.77 mg/dL). Liver Function Tests: Recent Labs  Lab 02/11/22 2037 02/12/22 0530  AST 28 29  ALT 24 23  ALKPHOS 99 109  BILITOT 0.6 0.4  PROT 8.0 7.9  ALBUMIN 2.2* 2.3*   No results for input(s): "LIPASE", "AMYLASE" in the last 168 hours. No results for input(s): "AMMONIA" in the last 168 hours. Coagulation profile Recent Labs  Lab 02/11/22 2037  INR 1.2    CBC: Recent Labs  Lab 02/11/22 2037 02/12/22 0530  WBC 10.5 8.1  NEUTROABS 7.5  --   HGB 7.8* 7.0*  HCT 25.4*  23.0*  MCV 87.6 87.8  PLT 307 268   Cardiac Enzymes: Recent Labs  Lab 02/12/22 0016  CKTOTAL 21*   BNP: Invalid input(s): "POCBNP" CBG: No results for input(s): "GLUCAP" in the last 168 hours. D-Dimer No results for input(s): "DDIMER" in the last 72 hours. Hgb A1c No results for input(s): "HGBA1C" in the last 72 hours. Lipid Profile No results for input(s): "CHOL", "HDL", "LDLCALC", "TRIG", "CHOLHDL", "LDLDIRECT" in the last 72 hours. Thyroid function studies No results for  input(s): "TSH", "T4TOTAL", "T3FREE", "THYROIDAB" in the last 72 hours.  Invalid input(s): "FREET3" Anemia work up No results for input(s): "VITAMINB12", "FOLATE", "FERRITIN", "TIBC", "IRON", "RETICCTPCT" in the last 72 hours. Microbiology Recent Results (from the past 240 hour(s))  Blood Culture (routine x 2)     Status: None (Preliminary result)   Collection Time: 02/11/22  8:36 PM   Specimen: BLOOD  Result Value Ref Range Status   Specimen Description   Final    BLOOD BLOOD LEFT FOREARM Performed at [facility] [address], [place], [place] [address]    Special Requests   Final    BOTTLES DRAWN AEROBIC AND ANAEROBIC Blood Culture adequate volume Performed at [facility] [address] [place], [place] [address]    Culture   Final    NO GROWTH 3 DAYS Performed at [facility], [place] [address], [place], [place] [address]    Report Status PENDING  Incomplete  Resp panel by RT-PCR (RSV, Flu A&B, Covid) Anterior Nasal Swab     Status: Abnormal   Collection Time: 02/11/22  8:37 PM   Specimen: Anterior Nasal Swab  Result Value Ref Range Status   SARS Coronavirus 2 by RT PCR POSITIVE (A) NEGATIVE Final    Comment: (NOTE) SARS-CoV-2 target nucleic acids are DETECTED.  The SARS-CoV-2 RNA is generally detectable in upper respiratory specimens during the acute phase of infection. Positive results are indicative of the presence of the identified virus, but do not rule out bacterial infection or co-infection with other pathogens not detected by the test. Clinical correlation with patient history and other diagnostic information is necessary to determine patient infection status. The expected result is Negative.  Fact Sheet for Patients: EntrepreneurPulse.com.au  Fact Sheet for Healthcare Providers: IncredibleEmployment.be  This test is not yet approved or cleared by the [place] FDA and  has  been authorized for detection and/or diagnosis of SARS-CoV-2 by FDA under an Emergency Use Authorization (EUA).  This EUA will remain in effect (meaning this test can be used) for the duration of  the COVID-19 declaration under Section 564(b)(1) of the A ct, 21 U.S.C. section 360bbb-3(b)(1), unless the authorization is terminated or revoked sooner.     Influenza A by PCR NEGATIVE NEGATIVE Final   Influenza B by PCR NEGATIVE NEGATIVE Final    Comment: (NOTE) The Xpert Xpress SARS-CoV-2/FLU/RSV plus assay is intended as an aid in the diagnosis of influenza from Nasopharyngeal swab specimens and should not be used as a sole basis for treatment. Nasal washings and aspirates are unacceptable for Xpert Xpress SARS-CoV-2/FLU/RSV testing.  Fact Sheet for Patients: EntrepreneurPulse.com.au  Fact Sheet for Healthcare Providers: IncredibleEmployment.be  This test is not yet approved or cleared by the [place] FDA and has been authorized for detection and/or diagnosis of SARS-CoV-2 by FDA under an Emergency Use Authorization (EUA). This EUA will remain in effect (meaning this test can be used) for the duration of the COVID-19 declaration under Section  564(b)(1) of the Act, 21 U.S.C. section 360bbb-3(b)(1), unless the authorization is terminated or revoked.     Resp Syncytial Virus by PCR NEGATIVE NEGATIVE Final    Comment: (NOTE) Fact Sheet for Patients: EntrepreneurPulse.com.au  Fact Sheet for Healthcare Providers: IncredibleEmployment.be  This test is not yet approved or cleared by the [place] FDA and has been authorized for detection and/or diagnosis of SARS-CoV-2 by FDA under an Emergency Use Authorization (EUA). This EUA will remain in effect (meaning this test can be used) for the duration of the COVID-19 declaration under Section 564(b)(1) of the Act, 21 U.S.C. section 360bbb-3(b)(1), unless the  authorization is terminated or revoked.  Performed at [facility], [place] [address], [place], [place] [address]   Blood Culture (routine x 2)     Status: None (Preliminary result)   Collection Time: 02/11/22  8:56 PM   Specimen: BLOOD  Result Value Ref Range Status   Specimen Description   Final    BLOOD BLOOD RIGHT FOREARM Performed at [facility] [address] [place], [place] [address]    Special Requests   Final    BOTTLES DRAWN AEROBIC AND ANAEROBIC Blood Culture adequate volume Performed at [facility] [address], [place], [place] [address]    Culture   Final    NO GROWTH 3 DAYS Performed at [facility], [place] [address] [place], [place] [address]    Report Status PENDING  Incomplete  Urine Culture     Status: None   Collection Time: 02/12/22 12:16 AM   Specimen: In/Out Cath Urine  Result Value Ref Range Status   Specimen Description   Final    IN/OUT CATH URINE Performed at [facility] [address], [place], [place] [address]    Special Requests   Final    NONE Performed at [facility], [place] [address], [place], [place] [address]    Culture   Final    NO GROWTH Performed at [facility], [place] [address], [place], [place] [address]    Report Status 02/14/2022 FINAL  Final     Discharge Instructions:   Discharge Instructions     Diet general   Complete by: As directed    Discharge instructions   Complete by: As directed    Follow-up with your hospice care provider at outpatient.   Increase activity slowly   Complete by: As directed    No wound care   Complete by: As directed       Allergies as of 02/15/2022   No Known Allergies      Medication List     STOP taking these medications    colchicine 0.6 MG tablet   feeding supplement (OSMOLITE 1.5 CAL) Liqd   feeding supplement (PROSource TF20) liquid   fluconazole 150 MG  tablet Commonly known as: DIFLUCAN   free water Soln   furosemide 20 MG tablet Commonly known as: LASIX   lactobacillus Pack   lidocaine-prilocaine cream Commonly known as: EMLA   potassium chloride 10 MEQ tablet Commonly known as: KLOR-CON       TAKE these medications    acetaminophen 325 MG tablet Commonly known as: TYLENOL Place 2 tablets (650 mg total) into feeding tube every 6 (six) hours as needed for mild pain (or Fever >/= 101).   antiseptic oral rinse Liqd Apply 15 mLs topically as needed for dry mouth.   bisacodyl 10 MG suppository Commonly  known as: DULCOLAX Place 1 suppository (10 mg total) rectally daily as needed for moderate constipation.   famotidine 40 MG/5ML suspension Commonly known as: PEPCID Place 2.5 mLs (20 mg total) into feeding tube at bedtime.   guaiFENesin 100 MG/5ML liquid Commonly known as: ROBITUSSIN Place 5 mLs into feeding tube every 4 (four) hours as needed for cough or to loosen phlegm.   haloperidol 0.5 MG tablet Commonly known as: HALDOL Take 1 tablet (0.5 mg total) by mouth every 4 (four) hours as needed for agitation (or delirium).   ipratropium-albuterol 0.5-2.5 (3) MG/3ML Soln Commonly known as: DUONEB Take 3 mLs by nebulization 3 (three) times daily.   LORazepam 0.5 MG tablet Commonly known as: ATIVAN Take 1 tablet (0.5 mg total) by mouth every 4 (four) hours as needed for anxiety (agitation).   melatonin 3 MG Tabs tablet Place 1 tablet (3 mg total) into feeding tube at bedtime as needed.   ondansetron 4 MG disintegrating tablet Commonly known as: ZOFRAN-ODT Take 1 tablet (4 mg total) by mouth every 6 (six) hours as needed for nausea.   oxycodone 5 MG capsule Commonly known as: OXY-IR Take 1 capsule (5 mg total) by mouth in the morning, at noon, in the evening, and at bedtime.   polyethylene glycol 17 g packet Commonly known as: MIRALAX / GLYCOLAX Take 17 g by mouth daily as needed for mild constipation.    polyethylene glycol 17 g packet Commonly known as: MIRALAX / GLYCOLAX Place 17 g into feeding tube daily.   polyvinyl alcohol 1.4 % ophthalmic solution Commonly known as: LIQUIFILM TEARS Place 1 drop into both eyes 4 (four) times daily as needed for dry eyes.   promethazine 6.25 MG/5ML syrup Commonly known as: PHENERGAN Take 12.5 mg (10 milliliters) via G-Tube every 6 hours as needed for nausea and vomiting.   traZODone 50 MG tablet Commonly known as: DESYREL Take 50 mg by mouth at bedtime.         Time coordinating discharge: 39 minutes  Signed:  Jonan Seufert  [facility] 02/15/2022, 9:32 AM

## 2022-02-16 ENCOUNTER — Inpatient Hospital Stay: Payer: Medicare PPO

## 2022-02-16 ENCOUNTER — Other Ambulatory Visit (HOSPITAL_BASED_OUTPATIENT_CLINIC_OR_DEPARTMENT_OTHER): Payer: Self-pay

## 2022-02-16 ENCOUNTER — Encounter: Payer: Self-pay | Admitting: Oncology

## 2022-02-16 ENCOUNTER — Inpatient Hospital Stay: Payer: Medicare PPO | Admitting: Nurse Practitioner

## 2022-02-17 ENCOUNTER — Other Ambulatory Visit (HOSPITAL_BASED_OUTPATIENT_CLINIC_OR_DEPARTMENT_OTHER): Payer: Self-pay

## 2022-02-17 LAB — CULTURE, BLOOD (ROUTINE X 2)
Culture: NO GROWTH
Culture: NO GROWTH
Special Requests: ADEQUATE
Special Requests: ADEQUATE

## 2022-02-19 ENCOUNTER — Other Ambulatory Visit (HOSPITAL_BASED_OUTPATIENT_CLINIC_OR_DEPARTMENT_OTHER): Payer: Self-pay

## 2022-02-19 ENCOUNTER — Encounter: Payer: Self-pay | Admitting: Oncology

## 2022-02-20 ENCOUNTER — Other Ambulatory Visit (HOSPITAL_BASED_OUTPATIENT_CLINIC_OR_DEPARTMENT_OTHER): Payer: Self-pay

## 2022-02-22 ENCOUNTER — Other Ambulatory Visit (HOSPITAL_COMMUNITY): Payer: Self-pay

## 2022-02-26 ENCOUNTER — Other Ambulatory Visit (HOSPITAL_BASED_OUTPATIENT_CLINIC_OR_DEPARTMENT_OTHER): Payer: Self-pay

## 2022-02-27 ENCOUNTER — Encounter (HOSPITAL_COMMUNITY): Payer: Self-pay | Admitting: Emergency Medicine

## 2022-02-27 ENCOUNTER — Other Ambulatory Visit: Payer: Self-pay

## 2022-02-27 ENCOUNTER — Emergency Department (HOSPITAL_COMMUNITY)
Admission: EM | Admit: 2022-02-27 | Discharge: 2022-02-27 | Disposition: A | Discharge status: Home / Self Care | Attending: Emergency Medicine | Admitting: Emergency Medicine

## 2022-02-27 DIAGNOSIS — K9429 Other complications of gastrostomy: Secondary | ICD-10-CM | POA: Insufficient documentation

## 2022-02-27 DIAGNOSIS — C153 Malignant neoplasm of upper third of esophagus: Secondary | ICD-10-CM | POA: Diagnosis not present

## 2022-02-27 DIAGNOSIS — T85528A Displacement of other gastrointestinal prosthetic devices, implants and grafts, initial encounter: Secondary | ICD-10-CM

## 2022-02-27 DIAGNOSIS — I10 Essential (primary) hypertension: Secondary | ICD-10-CM | POA: Diagnosis not present

## 2022-02-27 NOTE — ED Triage Notes (Signed)
Pt from home- arrives for G tube dislodgement. Pt's home health aid states his G tube came out and she tried to replace it, but unsure if it is in correctly. Pt is receiving hospice care at home for throat cancer.

## 2022-02-27 NOTE — ED Notes (Signed)
PT requesting oral fluids.  Called family and they stated pt is not on a thickened diet and is allowed to drink whatever he would like and it does not have to be thickened.  Pt also given yankaur to suction as needed.  Pt notified that we are waiting for a different ambulance service.

## 2022-02-27 NOTE — Care Management (Signed)
Called SO latoya. She states that someone will be home to receive patient. Message sent to RN

## 2022-02-27 NOTE — ED Provider Notes (Signed)
[facility] Provider Note   CSN: [id number] Arrival date & time: 02/27/22  1139     History  No chief complaint on file.   Leslie Livingston is a 72 y.o. male.  Patient is a 72 year old male with a history of hypertension, hyperlipidemia, throat cancer who has a G-tube for nutrition and medications who is currently under hospice care presenting today because his G-tube fell out about 2 hours ago and the home health nurse reports that she put it back in but was worried it may not be placed appropriately.  Otherwise there are no other complaints.  Patient was admitted several weeks ago for COVID and hypoxia but they report that is all improved.  The only reason he was sent here today was because of the G-tube.  Patient also indicates that this is correct.  The history is provided by the EMS personnel and the patient.       Home Medications Prior to Admission medications   Medication Sig Start Date End Date Taking? Authorizing Provider  acetaminophen (TYLENOL) 325 MG tablet Place 2 tablets (650 mg total) into feeding tube every 6 (six) hours as needed for mild pain (or Fever >/= 101). 02/15/22   Pokhrel, Laxman, MD  antiseptic oral rinse (BIOTENE) LIQD Apply 15 mLs topically as needed for dry mouth. 02/15/22 03/17/22  Pokhrel, Corrie Mckusick, MD  bisacodyl (DULCOLAX) 10 MG suppository Place 1 suppository (10 mg total) rectally daily as needed for moderate constipation. 02/15/22 03/17/22  Pokhrel, Corrie Mckusick, MD  famotidine (PEPCID) 40 MG/5ML suspension Place 2.5 mLs (20 mg total) into feeding tube at bedtime. 01/11/22   Lavina Hamman, MD  guaiFENesin (ROBITUSSIN) 100 MG/5ML liquid Place 5 mLs into feeding tube every 4 (four) hours as needed for cough or to loosen phlegm. 01/11/22   Lavina Hamman, MD  haloperidol (HALDOL) 0.5 MG tablet Take 1 tablet (0.5 mg total) by mouth every 4 (four) hours as needed for agitation (or delirium). 02/15/22 03/17/22  Pokhrel,  Corrie Mckusick, MD  ipratropium-albuterol (DUONEB) 0.5-2.5 (3) MG/3ML SOLN Take 3 mLs by nebulization 3 (three) times daily. 01/11/22   Lavina Hamman, MD  LORazepam (ATIVAN) 0.5 MG tablet Take 1 tablet (0.5 mg total) by mouth every 4 (four) hours as needed for anxiety (agitation). 02/15/22 03/17/22  Pokhrel, Corrie Mckusick, MD  melatonin 3 MG TABS tablet Place 1 tablet (3 mg total) into feeding tube at bedtime as needed. 01/11/22   Lavina Hamman, MD  ondansetron (ZOFRAN-ODT) 4 MG disintegrating tablet Dissolve 1 tablet under the tongue every 6 (six) hours as needed for nausea. 02/15/22 03/17/22  Pokhrel, Corrie Mckusick, MD  oxyCODONE (ROXICODONE) 5 MG/5ML solution Take 5 mLs (5 mg total) by mouth in the morning, at noon, in the evening, and at bedtime. 02/15/22   Pokhrel, Corrie Mckusick, MD  polyethylene glycol powder (GLYCOLAX/MIRALAX) 17 GM/SCOOP powder Take 17 g by mouth daily as needed for mild constipation. 02/15/22   Pokhrel, Corrie Mckusick, MD  polyethylene glycol (MIRALAX / GLYCOLAX) 17 g packet Place 17 g into feeding tube daily. 02/15/22   Pokhrel, Corrie Mckusick, MD  polyvinyl alcohol (LIQUIFILM TEARS) 1.4 % ophthalmic solution Place 1 drop into both eyes 4 (four) times daily as needed for dry eyes. 02/15/22   Pokhrel, Corrie Mckusick, MD  promethazine (PHENERGAN) 6.25 MG/5ML syrup Take 12.5 mg (10 milliliters) via G-Tube every 6 hours as needed for nausea and vomiting. 10/13/21   Ladell Pier, MD  traZODone (DESYREL) 50 MG tablet Take 50  mg by mouth at bedtime.    [provider]      Allergies    Patient has no known allergies.    Review of Systems   Review of Systems  Physical Exam Updated Vital Signs BP 113/61 (BP Location: Right Arm)   Pulse 92   Temp 98.7 F (37.1 C) (Oral)   Resp (!) 28   SpO2 94%  Physical Exam Vitals and nursing note reviewed.  Constitutional:      General: He is not in acute distress.    Appearance: He is well-developed.  HENT:     Head: Normocephalic and atraumatic.  Eyes:      Conjunctiva/sclera: Conjunctivae normal.     Pupils: Pupils are equal, round, and reactive to light.  Cardiovascular:     Rate and Rhythm: Normal rate and regular rhythm.     Heart sounds: No murmur heard. Pulmonary:     Effort: Pulmonary effort is normal. No respiratory distress.     Breath sounds: Rhonchi present. No wheezing or rales.     Comments: Harsh cough with some basilar rhonchi Abdominal:     General: There is no distension.     Palpations: Abdomen is soft.     Tenderness: There is no abdominal tenderness. There is no guarding or rebound.     Comments: G-tube present in the left upper quadrant.  Musculoskeletal:        General: No tenderness. Normal range of motion.     Cervical back: Normal range of motion and neck supple.  Skin:    General: Skin is warm and dry.     Findings: No erythema or rash.  Neurological:     Mental Status: He is alert and oriented to person, place, and time.  Psychiatric:        Behavior: Behavior normal.     ED Results / Procedures / Treatments   Labs (all labs ordered are listed, but only abnormal results are displayed) Labs Reviewed - No data to display  EKG None  Radiology No results found.  Procedures Gastrostomy tube replacement  Date/Time: 02/27/2022 1:13 PM  Performed by: Blanchie Dessert, MD Authorized by: Ladell Pier, MD  Consent: Verbal consent obtained. Consent given by: patient Patient understanding: patient states understanding of the procedure being performed Patient identity confirmed: verbally with patient Time out: Immediately prior to procedure a "time out" was called to verify the correct patient, procedure, equipment, support staff and site/side marked as required. Local anesthesia used: no  Anesthesia: Local anesthesia used: no  Sedation: Patient sedated: no  Patient tolerance: patient tolerated the procedure well with no immediate complications Comments: New 57fench g-tube was replaced without  difficulty       Medications Ordered in ED Medications - No data to display  ED Course/ Medical Decision Making/ A&P                             Medical Decision Making  Patient here today because his G-tube fell out.  New G-tube was placed without difficulty.  Patient is stable for discharge home.        Final Clinical Impression(s) / ED Diagnoses Final diagnoses:  Gastrojejunostomy tube dislodgement    Rx / DC Orders ED Discharge Orders     None         PBlanchie Dessert MD 02/27/22 1314

## 2022-03-03 ENCOUNTER — Other Ambulatory Visit (HOSPITAL_BASED_OUTPATIENT_CLINIC_OR_DEPARTMENT_OTHER): Payer: Self-pay

## 2022-03-04 ENCOUNTER — Other Ambulatory Visit (HOSPITAL_BASED_OUTPATIENT_CLINIC_OR_DEPARTMENT_OTHER): Payer: Self-pay

## 2022-03-26 DEATH — deceased

## 2022-07-21 ENCOUNTER — Encounter: Payer: Self-pay | Admitting: Oncology

## 2022-12-15 NOTE — Telephone Encounter (Signed)
Telephone call  

## 2023-12-05 ENCOUNTER — Other Ambulatory Visit: Payer: Self-pay

## 2024-01-28 IMAGING — CR DG CHEST 2V
2 series · 2 of 2 positions shown · non-contrast
Comparison: Chest radiograph 08/18/2015

CLINICAL DATA: Sore throat for 2 weeks, lost voice, right ear pain

EXAM:
CHEST - 2 VIEW

[w chest pa]
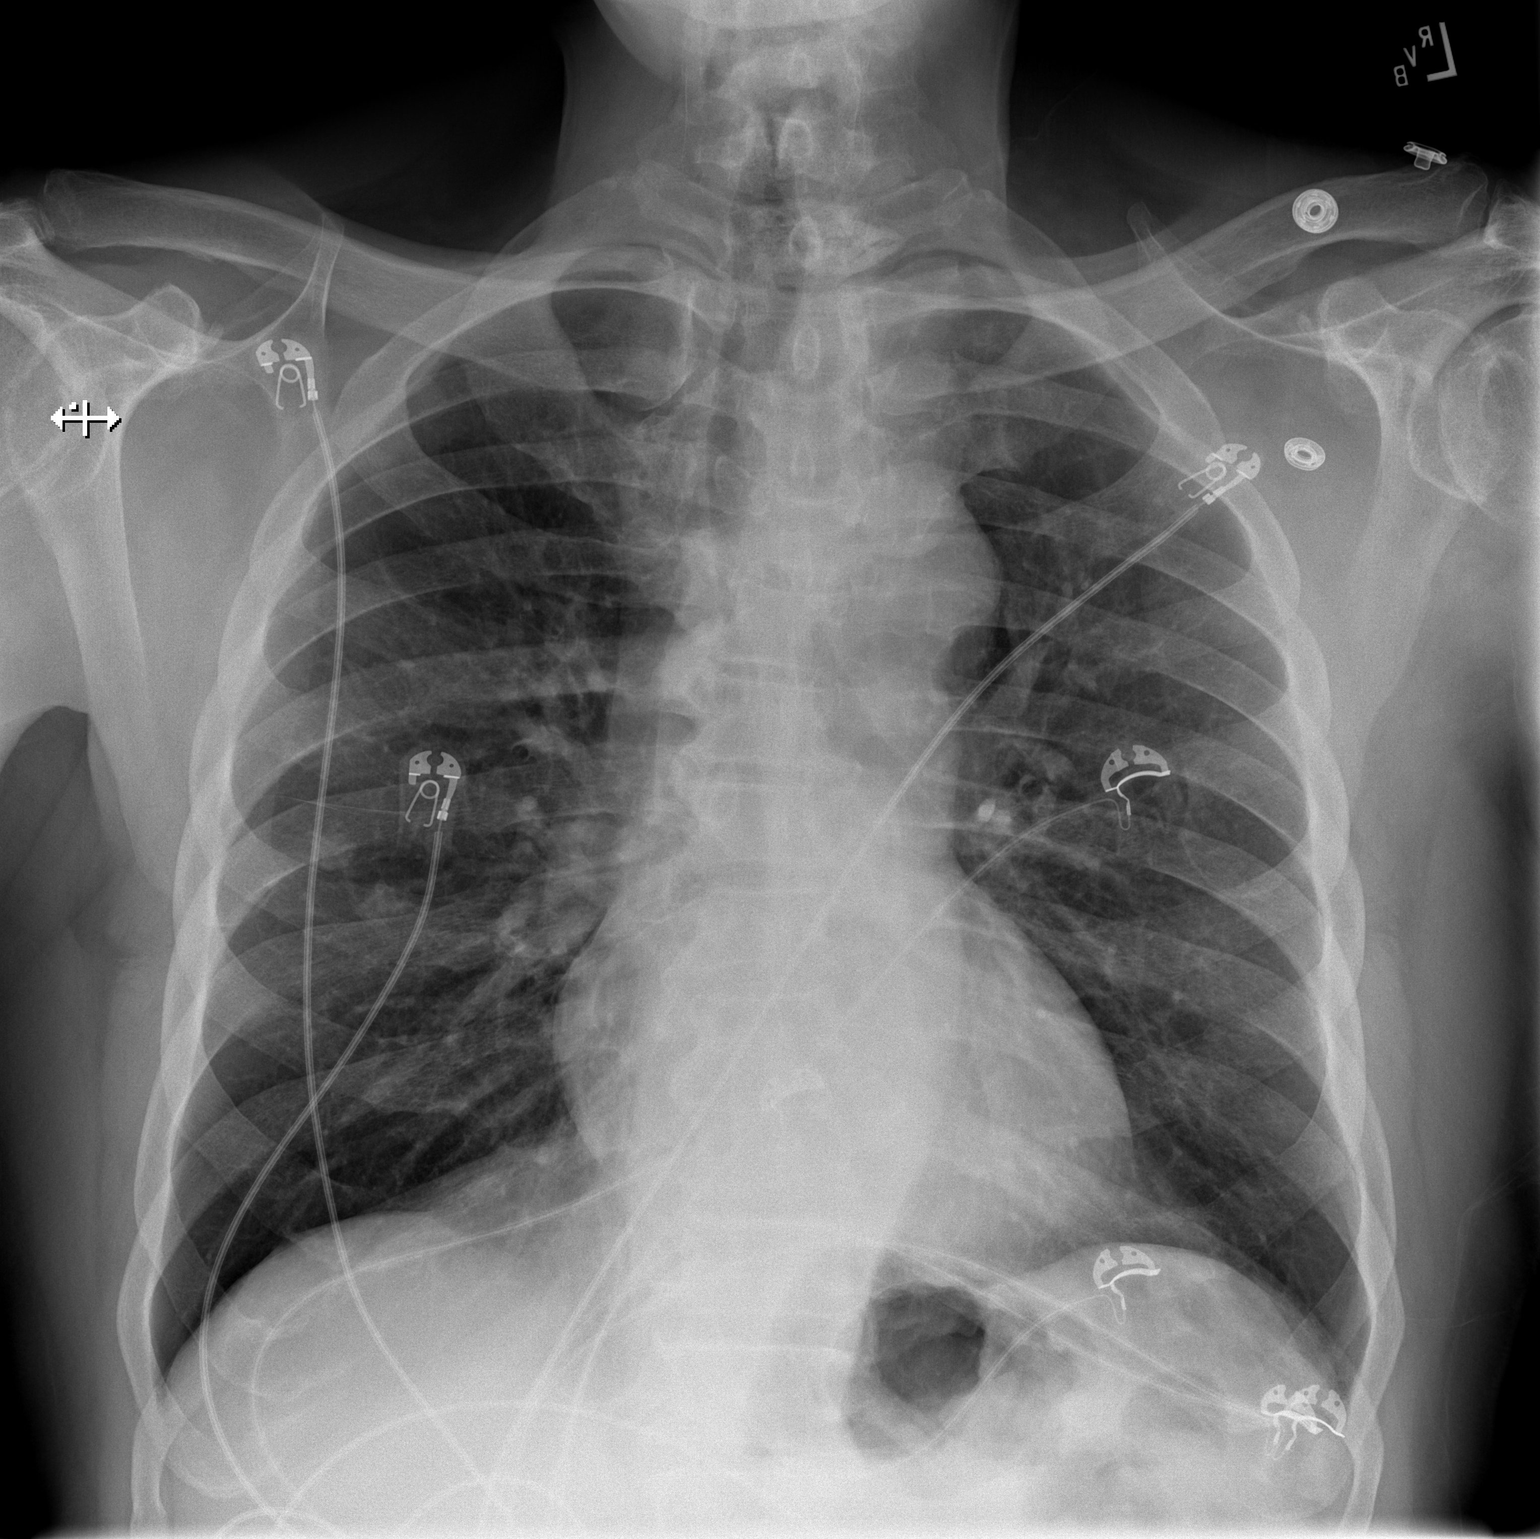

[w chest lat]
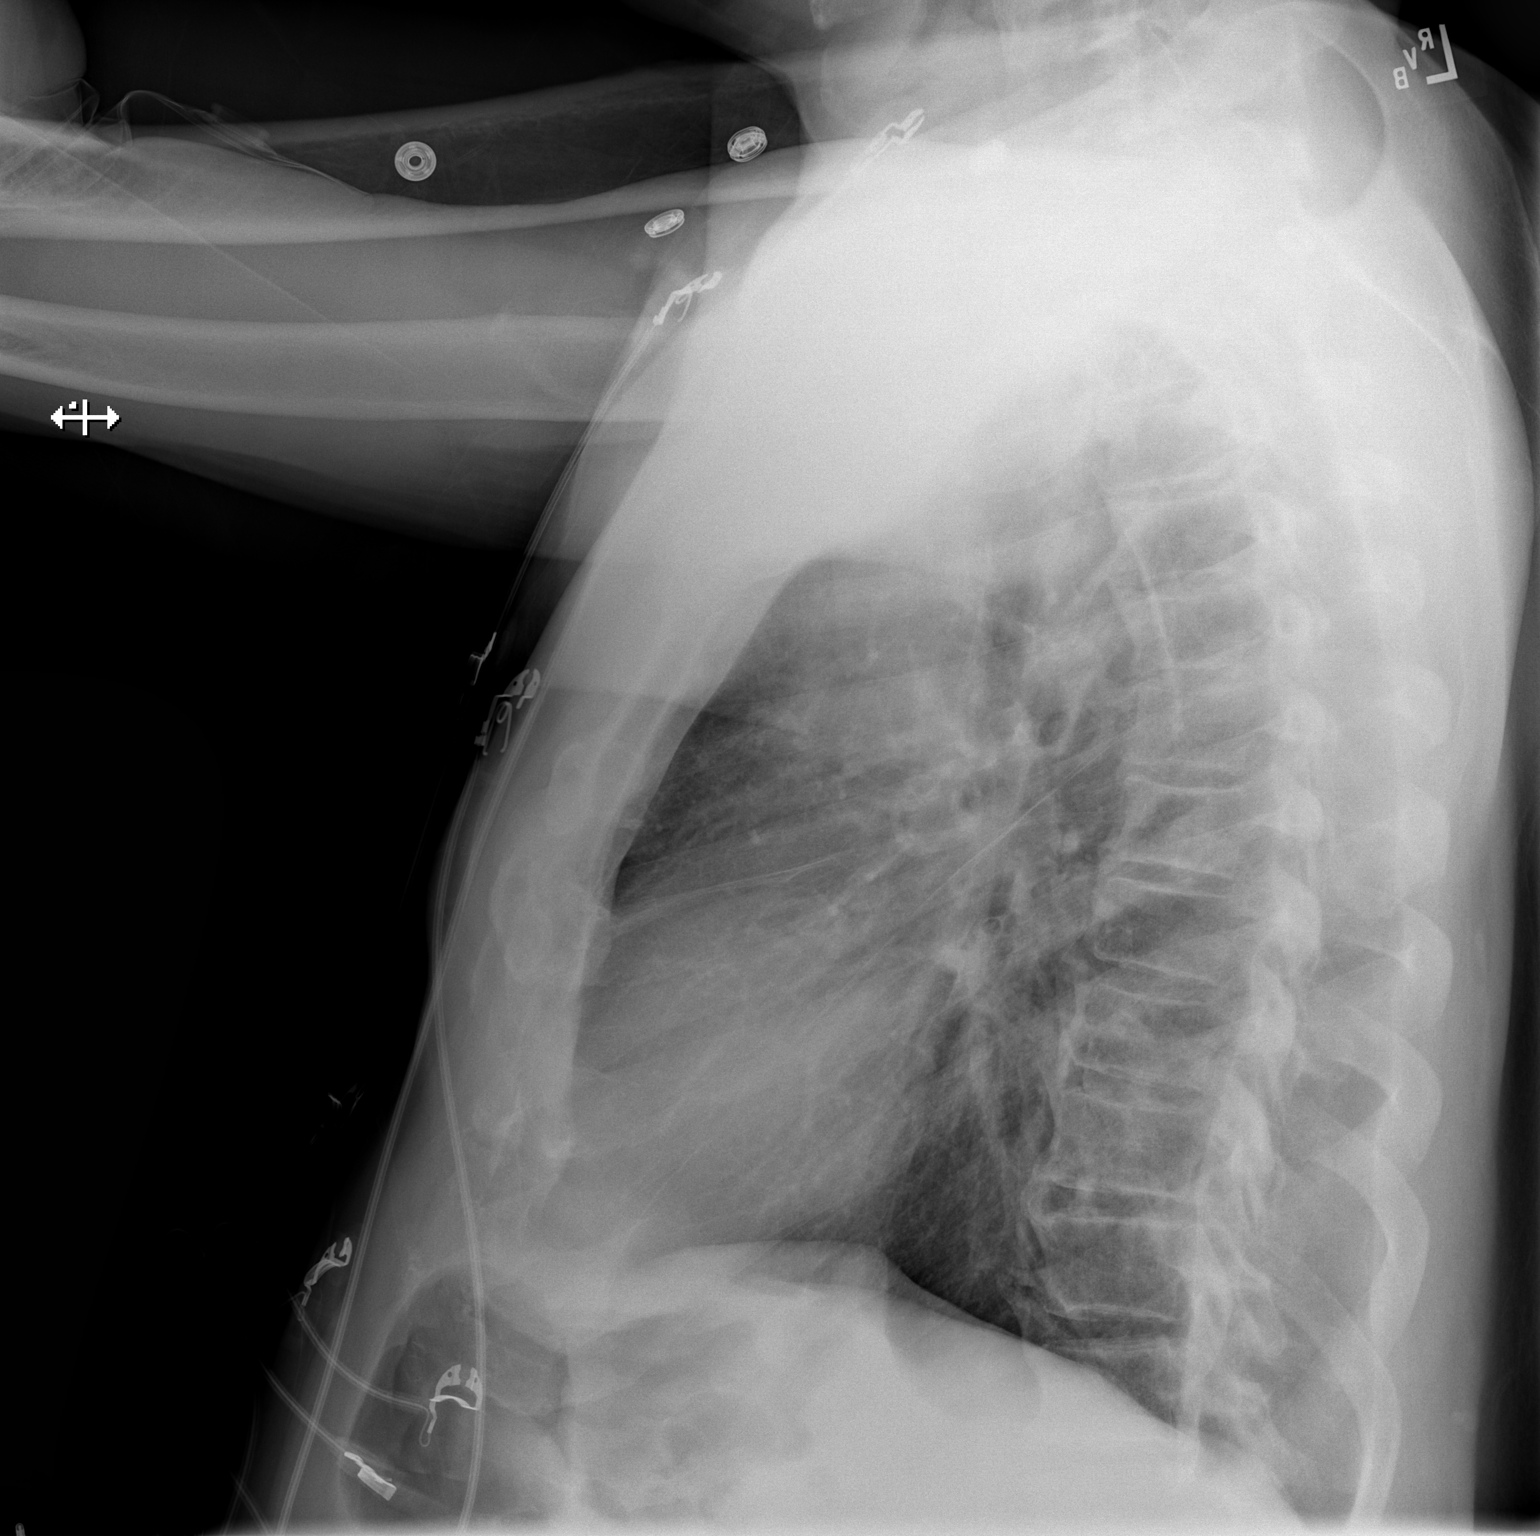

[2 of 2 positions shown; findings below may reference images not displayed]

FINDINGS: The cardiomediastinal silhouette is stable and within normal limits

There is no focal consolidation or pulmonary edema. There is no
pleural effusion or pneumothorax. There is a 1.2 cm nodular opacity
projecting over the right midlung not seen on the prior study.

There is no acute osseous abnormality.
IMPRESSION: 1. 1.2 cm nodular opacity projecting over the right midlung not seen
on the prior study from 2934. Recommend nonemergent CT chest for
further evaluation.
2. Otherwise, no radiographic evidence of acute cardiopulmonary
process.
